# Patient Record
Sex: Female | Born: 1960 | Race: White | Hispanic: No | Marital: Married | Smoking: Former smoker
Health system: Southern US, Community
[De-identification: ages and names within clinical notes are randomized; demographics above are authoritative.]

## PROBLEM LIST (undated history)

## (undated) DIAGNOSIS — A6920 Lyme disease, unspecified: Secondary | ICD-10-CM

## (undated) DIAGNOSIS — M160 Bilateral primary osteoarthritis of hip: Secondary | ICD-10-CM

## (undated) DIAGNOSIS — R0600 Dyspnea, unspecified: Secondary | ICD-10-CM

## (undated) DIAGNOSIS — I499 Cardiac arrhythmia, unspecified: Secondary | ICD-10-CM

## (undated) DIAGNOSIS — E7800 Pure hypercholesterolemia, unspecified: Secondary | ICD-10-CM

## (undated) DIAGNOSIS — I471 Supraventricular tachycardia, unspecified: Secondary | ICD-10-CM

## (undated) DIAGNOSIS — F29 Unspecified psychosis not due to a substance or known physiological condition: Secondary | ICD-10-CM

## (undated) DIAGNOSIS — G4730 Sleep apnea, unspecified: Secondary | ICD-10-CM

## (undated) DIAGNOSIS — T8859XA Other complications of anesthesia, initial encounter: Secondary | ICD-10-CM

## (undated) DIAGNOSIS — J8410 Pulmonary fibrosis, unspecified: Secondary | ICD-10-CM

## (undated) DIAGNOSIS — C439 Malignant melanoma of skin, unspecified: Secondary | ICD-10-CM

## (undated) DIAGNOSIS — R7303 Prediabetes: Secondary | ICD-10-CM

## (undated) DIAGNOSIS — J45909 Unspecified asthma, uncomplicated: Secondary | ICD-10-CM

## (undated) DIAGNOSIS — K760 Fatty (change of) liver, not elsewhere classified: Secondary | ICD-10-CM

## (undated) DIAGNOSIS — K219 Gastro-esophageal reflux disease without esophagitis: Secondary | ICD-10-CM

## (undated) DIAGNOSIS — M4802 Spinal stenosis, cervical region: Secondary | ICD-10-CM

## (undated) DIAGNOSIS — I10 Essential (primary) hypertension: Secondary | ICD-10-CM

## (undated) DIAGNOSIS — M48061 Spinal stenosis, lumbar region without neurogenic claudication: Secondary | ICD-10-CM

## (undated) HISTORY — DX: Malignant melanoma of skin, unspecified: C43.9

## (undated) HISTORY — PX: ABDOMINAL HYSTERECTOMY: SHX81

## (undated) HISTORY — PX: HERNIA REPAIR: SHX51

## (undated) HISTORY — DX: Fatty (change of) liver, not elsewhere classified: K76.0

## (undated) HISTORY — PX: BREAST SURGERY: SHX581

## (undated) HISTORY — DX: Pulmonary fibrosis, unspecified: J84.10

## (undated) HISTORY — DX: Spinal stenosis, lumbar region without neurogenic claudication: M48.061

## (undated) HISTORY — PX: APPENDECTOMY: SHX54

## (undated) HISTORY — PX: LAPAROSCOPY: SHX197

## (undated) HISTORY — PX: BACK SURGERY: SHX140

## (undated) HISTORY — PX: HIATAL HERNIA REPAIR: SHX195

## (undated) HISTORY — DX: Spinal stenosis, cervical region: M48.02

## (undated) HISTORY — PX: OTHER SURGICAL HISTORY: SHX169

## (undated) NOTE — Progress Notes (Signed)
 Formatting of this note is different from the original. S) Victoria [name] is a 7 yr old female patient who had LAVH, BSO 23 months ago.  She states that she has had recurrent UTIs and believes that she has seen stool coming out of the urethra. She has done some research herself and is concerned about a fistula. She has no vaginal sx. She stated that sexual intercourse was good.  O) BP 99/60  Pulse 58  Wt 179 lb (81.194 kg)       Abd soft flat nt       Pelvic         Vulva normal          Urethra normal appearance         Vagina Premarin cream in place, no defects         Cervix absent         Uterus absent         Adnexa no masses nt Past Medical History  Diagnosis Date  ? PERNICIOUS ANEMIA   ? DEPRESSION, UNSPECIFIED   ? GERD   ? OLIGOMENORRHEA   ? HYPERCHOLESTEROLEMIA   ? FIBROMYALGIA 08/18/2004  ? LIVER DISEASE, DRUG INDUCED     Anesthesia related but reversed  ? OBSTRUCTIVE SLEEP APNEA 10/09/2008  ? HX OF HYSTERECTOMY, TOTAL 10/30/2008  ? NEGATIVE COLONOSCOPY DONE OUTSIDE KP IN 2006    Past Surgical History  Procedure Date  ? Diagnostic laparoscopy 5/06    Laparoscopy  ? Appendectomy     Laparoscopic  ? Laparoscopic assisted vaginal hysterectomy 6/10  ? Salpingo-oophorectomy, bilateral 6/10  ? Nasal/sinus ndsc surg w/concha bullosa resection     2008   Current Outpatient Prescriptions  Medication  ? Clarithromycin 500 mg Oral Tab  ? Meloxicam  15 mg Oral Tab  ? Montelukast (SINGULAIR) 10 mg Oral Tab  ? Escitalopram  (LEXAPRO ) 20 mg Oral Tab  ? DULoxetine  (CYMBALTA ) 20 mg Oral CPDR SR Cap  ? Fluticasone -Salmeterol (ADVAIR DISKUS) 500-50 mcg/dose Inhl Disk w/devi  ? Pantoprazole  20 mg Oral TBEC DR Tab  ? Metoprolol  Tartrate 25 mg Oral Tab  ? Albuterol  2.5 mg /3 mL (0.083 %) Inhl Neb Soln  ? clonazePAM  0.5 mg Oral Tab  ? Pravastatin 80 mg Oral Tab  ? Albuterol  (PROAIR  HFA) 90 mcg/Actuation Inhl HFAA  ? Fluticasone  50 mcg/actuation Nasl SpSn  ? Estrogens  Conjugated (PREMARIN) 0.625 mg/gram Vagl Crea  ? Estradiol  0.5 mg Oral Tab  ? Cyanocobalamin  (VITAMIN B-12) 1,000 mcg Oral Tab  ? Blood Glucose Control, Normal (ONE TOUCH ULTRA CONTROL) Misc Soln  ? Blood Sugar Test (ONE TOUCH ULTRA TEST) Misc Strips  ? Peak Flow Meter (TRUZONE PEAK FLOW METER) Misc Devi   Allergy  Acetaminophen ; Amoxicillin trihydrate; Aspirin; Ceftin; Codeine sulfate; Penicillins class; Sulfa class; and Zoloft  A)   1. VAGINITIS   2. URINARY TRACT INFECTION, RECURRENT    Rx)   Orders Placed This Encounter  ? XR URETHROCYSTOGRAPHY RETROGRADE  Finish antibiotics that she is on.         See AVS Completed by:  ROWLAND CHRISTELLA HUMMER MD, September 28, 2010, 11:04 AM ---------------------------------------------------------------------------------  Electronically signed by Hummer Rowland CHRISTELLA (M.D.) at 09/28/2010 11:07 AM EDT

## (undated) NOTE — Telephone Encounter (Signed)
 Formatting of this note might be different from the original. Left messages on home and cell phone numbers for patient to call back. Completed by:  CAROLANNE JULIANNA ARTHOR RANSOM MD, August 06, 2010, 3:30 PM   Electronically signed by ARTHOR RANSOM, CAROLANNE JULIANNA (M.D.) at 08/06/2010  4:47 PM EST

## (undated) NOTE — Telephone Encounter (Signed)
 Formatting of this note might be different from the original. . Electronically signed by Graeme Suzen GRADE (R.N.) at 08/06/2010 11:54 AM EST

---

## 1995-06-01 HISTORY — PX: BREAST EXCISIONAL BIOPSY: SUR124

## 2004-05-31 HISTORY — PX: APPENDECTOMY: SHX54

## 2004-08-11 DIAGNOSIS — H9319 Tinnitus, unspecified ear: Secondary | ICD-10-CM | POA: Insufficient documentation

## 2006-08-03 DIAGNOSIS — M7710 Lateral epicondylitis, unspecified elbow: Secondary | ICD-10-CM | POA: Insufficient documentation

## 2008-05-31 HISTORY — PX: ABDOMINAL HYSTERECTOMY: SHX81

## 2008-10-30 DIAGNOSIS — Z90710 Acquired absence of both cervix and uterus: Secondary | ICD-10-CM | POA: Insufficient documentation

## 2008-10-30 DIAGNOSIS — Z9079 Acquired absence of other genital organ(s): Secondary | ICD-10-CM | POA: Insufficient documentation

## 2009-03-26 DIAGNOSIS — J4530 Mild persistent asthma, uncomplicated: Secondary | ICD-10-CM | POA: Insufficient documentation

## 2010-06-23 DIAGNOSIS — F341 Dysthymic disorder: Secondary | ICD-10-CM | POA: Insufficient documentation

## 2012-03-06 DIAGNOSIS — E739 Lactose intolerance, unspecified: Secondary | ICD-10-CM | POA: Insufficient documentation

## 2012-09-07 DIAGNOSIS — Z1211 Encounter for screening for malignant neoplasm of colon: Secondary | ICD-10-CM | POA: Insufficient documentation

## 2012-09-20 DIAGNOSIS — K6389 Other specified diseases of intestine: Secondary | ICD-10-CM | POA: Insufficient documentation

## 2014-01-02 DIAGNOSIS — R7401 Elevation of levels of liver transaminase levels: Secondary | ICD-10-CM | POA: Insufficient documentation

## 2014-01-02 DIAGNOSIS — R7301 Impaired fasting glucose: Secondary | ICD-10-CM | POA: Insufficient documentation

## 2014-02-25 DIAGNOSIS — F900 Attention-deficit hyperactivity disorder, predominantly inattentive type: Secondary | ICD-10-CM | POA: Insufficient documentation

## 2015-07-05 ENCOUNTER — Emergency Department: Payer: 59

## 2015-07-05 ENCOUNTER — Encounter: Payer: Self-pay | Admitting: Emergency Medicine

## 2015-07-05 ENCOUNTER — Emergency Department
Admission: EM | Admit: 2015-07-05 | Discharge: 2015-07-07 | Disposition: A | Discharge status: Other Acute Inpatient Hospital | Payer: 59 | Attending: Emergency Medicine | Admitting: Emergency Medicine

## 2015-07-05 DIAGNOSIS — R4182 Altered mental status, unspecified: Secondary | ICD-10-CM | POA: Diagnosis present

## 2015-07-05 DIAGNOSIS — Z880 Allergy status to penicillin: Secondary | ICD-10-CM | POA: Diagnosis not present

## 2015-07-05 DIAGNOSIS — M160 Bilateral primary osteoarthritis of hip: Secondary | ICD-10-CM

## 2015-07-05 DIAGNOSIS — Z79899 Other long term (current) drug therapy: Secondary | ICD-10-CM | POA: Insufficient documentation

## 2015-07-05 DIAGNOSIS — F312 Bipolar disorder, current episode manic severe with psychotic features: Secondary | ICD-10-CM

## 2015-07-05 DIAGNOSIS — I10 Essential (primary) hypertension: Secondary | ICD-10-CM

## 2015-07-05 DIAGNOSIS — Z87891 Personal history of nicotine dependence: Secondary | ICD-10-CM | POA: Insufficient documentation

## 2015-07-05 DIAGNOSIS — Z7951 Long term (current) use of inhaled steroids: Secondary | ICD-10-CM | POA: Diagnosis not present

## 2015-07-05 DIAGNOSIS — J45909 Unspecified asthma, uncomplicated: Secondary | ICD-10-CM

## 2015-07-05 DIAGNOSIS — F333 Major depressive disorder, recurrent, severe with psychotic symptoms: Secondary | ICD-10-CM | POA: Diagnosis not present

## 2015-07-05 DIAGNOSIS — F29 Unspecified psychosis not due to a substance or known physiological condition: Secondary | ICD-10-CM

## 2015-07-05 DIAGNOSIS — F309 Manic episode, unspecified: Secondary | ICD-10-CM | POA: Insufficient documentation

## 2015-07-05 DIAGNOSIS — F1310 Sedative, hypnotic or anxiolytic abuse, uncomplicated: Secondary | ICD-10-CM | POA: Diagnosis not present

## 2015-07-05 DIAGNOSIS — E785 Hyperlipidemia, unspecified: Secondary | ICD-10-CM

## 2015-07-05 DIAGNOSIS — K219 Gastro-esophageal reflux disease without esophagitis: Secondary | ICD-10-CM

## 2015-07-05 HISTORY — DX: Gastro-esophageal reflux disease without esophagitis: K21.9

## 2015-07-05 HISTORY — DX: Unspecified asthma, uncomplicated: J45.909

## 2015-07-05 HISTORY — DX: Pure hypercholesterolemia, unspecified: E78.00

## 2015-07-05 HISTORY — DX: Unspecified psychosis not due to a substance or known physiological condition: F29

## 2015-07-05 LAB — COMPREHENSIVE METABOLIC PANEL
ALK PHOS: 92 U/L (ref 38–126)
ALT: 64 U/L — AB (ref 14–54)
AST: 33 U/L (ref 15–41)
Albumin: 3.8 g/dL (ref 3.5–5.0)
Anion gap: 11 (ref 5–15)
BILIRUBIN TOTAL: 0.6 mg/dL (ref 0.3–1.2)
BUN: 22 mg/dL — ABNORMAL HIGH (ref 6–20)
CALCIUM: 9.4 mg/dL (ref 8.9–10.3)
CO2: 24 mmol/L (ref 22–32)
CREATININE: 0.95 mg/dL (ref 0.44–1.00)
Chloride: 109 mmol/L (ref 101–111)
Glucose, Bld: 124 mg/dL — ABNORMAL HIGH (ref 65–99)
Potassium: 3.7 mmol/L (ref 3.5–5.1)
Sodium: 144 mmol/L (ref 135–145)
TOTAL PROTEIN: 7.3 g/dL (ref 6.5–8.1)

## 2015-07-05 LAB — ACETAMINOPHEN LEVEL

## 2015-07-05 LAB — CBC WITH DIFFERENTIAL/PLATELET
Basophils Absolute: 0 10*3/uL (ref 0–0.1)
Basophils Relative: 1 %
EOS ABS: 0.1 10*3/uL (ref 0–0.7)
EOS PCT: 1 %
HCT: 38.8 % (ref 35.0–47.0)
Hemoglobin: 12.9 g/dL (ref 12.0–16.0)
LYMPHS ABS: 2.6 10*3/uL (ref 1.0–3.6)
Lymphocytes Relative: 29 %
MCH: 31.5 pg (ref 26.0–34.0)
MCHC: 33.2 g/dL (ref 32.0–36.0)
MCV: 94.9 fL (ref 80.0–100.0)
Monocytes Absolute: 1 10*3/uL — ABNORMAL HIGH (ref 0.2–0.9)
Monocytes Relative: 11 %
Neutro Abs: 5.1 10*3/uL (ref 1.4–6.5)
Neutrophils Relative %: 58 %
PLATELETS: 311 10*3/uL (ref 150–440)
RBC: 4.09 MIL/uL (ref 3.80–5.20)
RDW: 13.6 % (ref 11.5–14.5)
WBC: 8.8 10*3/uL (ref 3.6–11.0)

## 2015-07-05 LAB — URINALYSIS COMPLETE WITH MICROSCOPIC (ARMC ONLY)
BILIRUBIN URINE: NEGATIVE
GLUCOSE, UA: NEGATIVE mg/dL
HGB URINE DIPSTICK: NEGATIVE
Leukocytes, UA: NEGATIVE
Nitrite: NEGATIVE
Protein, ur: 30 mg/dL — AB
Specific Gravity, Urine: 1.03 (ref 1.005–1.030)
pH: 5 (ref 5.0–8.0)

## 2015-07-05 LAB — URINE DRUG SCREEN, QUALITATIVE (ARMC ONLY)
AMPHETAMINES, UR SCREEN: NOT DETECTED
BARBITURATES, UR SCREEN: NOT DETECTED
Benzodiazepine, Ur Scrn: NOT DETECTED
Cannabinoid 50 Ng, Ur ~~LOC~~: NOT DETECTED
Cocaine Metabolite,Ur ~~LOC~~: NOT DETECTED
MDMA (ECSTASY) UR SCREEN: NOT DETECTED
Methadone Scn, Ur: NOT DETECTED
Opiate, Ur Screen: NOT DETECTED
Phencyclidine (PCP) Ur S: NOT DETECTED
TRICYCLIC, UR SCREEN: POSITIVE — AB

## 2015-07-05 LAB — ETHANOL

## 2015-07-05 LAB — SALICYLATE LEVEL: Salicylate Lvl: <4 mg/dL (ref 2.8–30.0)

## 2015-07-05 MED ORDER — LORAZEPAM 1 MG PO TABS
1.0000 mg | ORAL_TABLET | Freq: Once | ORAL | Status: AC
  Administered 2015-07-05: 1 mg via ORAL
  Filled 2015-07-05: qty 1

## 2015-07-05 MED ORDER — LORAZEPAM 2 MG/ML IJ SOLN
1.0000 mg | Freq: Once | INTRAMUSCULAR | Status: AC
  Administered 2015-07-05: 1 mg via INTRAVENOUS

## 2015-07-05 MED ORDER — QUETIAPINE FUMARATE 25 MG PO TABS
100.0000 mg | ORAL_TABLET | Freq: Every day | ORAL | Status: DC
  Administered 2015-07-05: 100 mg via ORAL
  Filled 2015-07-05: qty 4

## 2015-07-05 MED ORDER — SODIUM CHLORIDE 0.9 % IV BOLUS (SEPSIS)
1000.0000 mL | Freq: Once | INTRAVENOUS | Status: AC
  Administered 2015-07-05: 1000 mL via INTRAVENOUS

## 2015-07-05 MED ORDER — ACETAMINOPHEN 325 MG PO TABS
650.0000 mg | ORAL_TABLET | Freq: Four times a day (QID) | ORAL | Status: DC | PRN
  Administered 2015-07-05: 650 mg via ORAL
  Filled 2015-07-05: qty 2

## 2015-07-05 MED ORDER — DIPHENHYDRAMINE HCL 50 MG/ML IJ SOLN
25.0000 mg | Freq: Once | INTRAMUSCULAR | Status: AC
  Administered 2015-07-05: 25 mg via INTRAVENOUS

## 2015-07-05 MED ORDER — DIPHENHYDRAMINE HCL 50 MG/ML IJ SOLN
INTRAMUSCULAR | Status: AC
  Administered 2015-07-05: 25 mg via INTRAVENOUS
  Filled 2015-07-05: qty 1

## 2015-07-05 MED ORDER — LORAZEPAM 2 MG/ML IJ SOLN
INTRAMUSCULAR | Status: AC
Start: 2015-07-05 — End: 2015-07-05
  Administered 2015-07-05: 1 mg via INTRAVENOUS
  Filled 2015-07-05: qty 1

## 2015-07-05 NOTE — ED Notes (Signed)
Patient and husband doing [facility] psych consult.

## 2015-07-05 NOTE — Consult Note (Addendum)
[facility] HOSPITALIST  Medical Consultation  Victoria [name] OLM:[id number] DOB: [birth year] DOA: 07/05/2015 PCP: No primary care provider on file.   Requesting physician: Kizzie Bane MD Date of consultation: 07/06/2015 Reason for consultation: Temperature of 99.8 F for evaluation of infection.  CHIEF COMPLAINT:   Chief Complaint  Patient presents with  . Altered Mental Status    HISTORY OF PRESENT ILLNESS: Victoria [name]  is a 55 y.o. female with a known history of psychotic disorder, hyperlipidemia, bronchial asthma, GERD currently in the emergency room in the holding unit awaiting inpatient psychiatric placement. Patient is involuntarily committed for acute psychosis and acute mania. Hospitalist service was consulted by ER physician for evaluation of temperature of 99.73F. Subsequent body temperature was 97.35F. Patient seen and evaluated. No complaints of any cough. No history of chills or any burning when she passes urine. No history of any chest pain or shortness of breath. Patient does not volunteer much history little sleepy secondary to medication. Patient is currently on Seroquel and has been seen by psychiatry. Workup in the emergency room urinalysis showed no infection. WBC count and CBC is normal. Electrolytes are normal. Information obtained from the psychiatric consultation note, patient was not sleeping for the last 7 days as per family. She was very disorganized and nonsensical and husband she was getting worse and worse.Marland Kitchen  PAST MEDICAL HISTORY:   Past Medical History  Diagnosis Date  . Psychotic disorder   . Hypercholesterolemia   . Asthma   . GERD (gastroesophageal reflux disease)     PAST SURGICAL HISTORY: Past Surgical History  Procedure Laterality Date  . Hiatal hernia repair      Jan 2017  . Abdominal hysterectomy      SOCIAL HISTORY:  Social History  Substance Use Topics  . Smoking status: Former Research scientist (life sciences)  . Smokeless tobacco: Not on file  . Alcohol Use: No     FAMILY HISTORY: No family history on file.  Family History : Heart Disease : Mother Personal History : Materials engineer.  DRUG ALLERGIES:  Allergies  Allergen Reactions  . Amoxicillin   . Aspirin   . Codeine   . Penicillins   . Sulfa Antibiotics     REVIEW OF SYSTEMS:   CONSTITUTIONAL: No fever, fatigue or weakness.  EYES: No blurred or double vision.  EARS, NOSE, AND THROAT: No tinnitus or ear pain.  RESPIRATORY: No cough, shortness of breath, wheezing or hemoptysis.  CARDIOVASCULAR: No chest pain, orthopnea, edema.  GASTROINTESTINAL: No nausea, vomiting, diarrhea or abdominal pain.  GENITOURINARY: No dysuria, hematuria.  ENDOCRINE: No polyuria, nocturia,  HEMATOLOGY: No anemia, easy bruising or bleeding SKIN: No rash or lesion. MUSCULOSKELETAL: No joint pain or arthritis.   NEUROLOGIC: No tingling, numbness, weakness.  PSYCHIATRY: episodes of acute mania.  MEDICATIONS AT HOME:  Prior to Admission medications   Medication Sig Start Date End Date Taking? Authorizing Provider  busPIRone (BUSPAR) 10 MG tablet Take 10 mg by mouth 2 (two) times daily. 06/12/15  Yes Historical Provider, MD  celecoxib (CELEBREX) 200 MG capsule Take 200 mg by mouth 2 (two) times daily. 05/21/15  Yes Historical Provider, MD  clonazePAM (KLONOPIN) 0.5 MG tablet Take 0.5-1 tablets by mouth 3 (three) times daily as needed. 05/31/15  Yes Historical Provider, MD  escitalopram (LEXAPRO) 20 MG tablet Take 20 mg by mouth 2 (two) times daily. 05/07/15  Yes Historical Provider, MD  fluticasone (FLONASE) 50 MCG/ACT nasal spray Place 2 sprays into both nostrils daily. 04/02/15  Yes Historical Provider, MD  metoprolol tartrate (LOPRESSOR) 25 MG tablet Take 12.5 mg by mouth 2 (two) times daily. 04/06/15  Yes Historical Provider, MD  oxyCODONE-acetaminophen (PERCOCET/ROXICET) 5-325 MG tablet Take 2 tablets by mouth every 4 (four) hours. 06/29/15  Yes Historical Provider, MD  QUEtiapine (SEROQUEL) 25 MG tablet Take 75 mg  by mouth every evening. 05/08/15  Yes Historical Provider, MD  traMADol (ULTRAM) 50 MG tablet Take 1-2 tablets by mouth every 6 (six) hours as needed. 05/21/15  Yes Historical Provider, MD  traZODone (DESYREL) 50 MG tablet Take 1-3 tablets by mouth at bedtime. 05/23/15  Yes Historical Provider, MD  ZETIA 10 MG tablet Take 10 mg by mouth daily. 04/06/15  Yes Historical Provider, MD      PHYSICAL EXAMINATION:   VITAL SIGNS: Blood pressure 112/61, pulse 87, temperature 97.8 F (36.6 C), temperature source Oral, resp. rate 18, height 5' 5"  (1.651 m), weight 69.854 kg (154 lb), SpO2 97 %.  GENERAL:  55 y.o.-year-old patient lying in the bed with no acute distress.  EYES: Pupils equal, round, reactive to light and accommodation. No scleral icterus. Extraocular muscles intact.  HEENT: Head atraumatic, normocephalic. Oropharynx and nasopharynx clear.  NECK:  Supple, no jugular venous distention. No thyroid enlargement, no tenderness.  LUNGS: Normal breath sounds bilaterally, no wheezing, rales,rhonchi or crepitation. No use of accessory muscles of respiration.  CARDIOVASCULAR: S1, S2 normal. No murmurs, rubs, or gallops.  ABDOMEN: Soft, nontender, nondistended. Bowel sounds present. No organomegaly or mass.  EXTREMITIES: No pedal edema, cyanosis, or clubbing.  NEUROLOGIC: Cranial nerves II through XII are intact. Muscle strength 5/5 in all extremities. Sensation intact. No cerebellar signs noted. PSYCHIATRIC: The patient is alert and oriented x 3.  SKIN: No obvious rash, lesion, or ulcer.   LABORATORY PANEL:   CBC  Recent Labs Lab 07/05/15 1709  WBC 8.8  HGB 12.9  HCT 38.8  PLT 311  MCV 94.9  MCH 31.5  MCHC 33.2  RDW 13.6  LYMPHSABS 2.6  MONOABS 1.0*  EOSABS 0.1  BASOSABS 0.0   ------------------------------------------------------------------------------------------------------------------  Chemistries   Recent Labs Lab 07/05/15 1709  NA 144  K 3.7  CL 109  CO2 24   GLUCOSE 124*  BUN 22*  CREATININE 0.95  CALCIUM 9.4  AST 33  ALT 64*  ALKPHOS 92  BILITOT 0.6   ------------------------------------------------------------------------------------------------------------------ estimated creatinine clearance is 65.7 mL/min (by C-G formula based on Cr of 0.95). ------------------------------------------------------------------------------------------------------------------ No results for input(s): TSH, T4TOTAL, T3FREE, THYROIDAB in the last 72 hours.  Invalid input(s): FREET3   Coagulation profile No results for input(s): INR, PROTIME in the last 168 hours. ------------------------------------------------------------------------------------------------------------------- No results for input(s): DDIMER in the last 72 hours. -------------------------------------------------------------------------------------------------------------------  Cardiac Enzymes No results for input(s): CKMB, TROPONINI, MYOGLOBIN in the last 168 hours.  Invalid input(s): CK ------------------------------------------------------------------------------------------------------------------ Invalid input(s): POCBNP  ---------------------------------------------------------------------------------------------------------------  Urinalysis    Component Value Date/Time   COLORURINE AMBER* 07/05/2015 1748   APPEARANCEUR HAZY* 07/05/2015 1748   LABSPEC 1.030 07/05/2015 1748   PHURINE 5.0 07/05/2015 1748   GLUCOSEU NEGATIVE 07/05/2015 1748   HGBUR NEGATIVE 07/05/2015 [place] 07/05/2015 1748   KETONESUR TRACE* 07/05/2015 1748   PROTEINUR 30* 07/05/2015 1748   NITRITE NEGATIVE 07/05/2015 1748   LEUKOCYTESUR NEGATIVE 07/05/2015 1748     RADIOLOGY: No results found.  EKG: Orders placed or performed during the hospital encounter of 07/05/15  . ED EKG  . ED EKG    IMPRESSION AND PLAN: 55 year old female patient with history of psychosis, GERD,  bronchial asthma, hyperlipidemia presented to the emergency room and currently under holding unit in the emergency room awaiting psychiatric placement. Patient is involuntarily committed and has acute mania and psychosis. Assessment 1. Temperature of 99.52F no evidence of any infection 2. Hyperlipidemia 3. Bronchial asthma stable 4. GERD 5. Psychosis 6. Acute mania 7.Incidental Pulmonary nodule on CXR Treatment plan Continue psychiatric recommendations and patient needs in patient psychiatric placement Patient is involuntarily committed Follow-up  cultures. No evidence of any infection at this moment Out patient pulmonary consultation for further evaluation of nodule. Will follow up patient as needed Thank you for consultation.  All the records are reviewed and case discussed with ED provider. Management plans discussed with the patient, family and they are in agreement.  CODE STATUS:FULL Code Status History    This patient does not have a recorded code status. Please follow your organizational policy for patients in this situation.       TOTAL TIME TAKING CARE OF THIS PATIENT: 38 minutes.    Saundra Shelling M.D on 07/05/2015 at 11:45 PM  Between 7am to 6pm - Pager - [phone]  After 6pm go to [email or web address] - password [email or web address] [facility]  [facility]  Office  [phone]  CC: Primary care physician; No primary care provider on file.

## 2015-07-05 NOTE — BH Assessment (Signed)
Per [place] Dewaine Conger, MD)  -Pt needs an inpt psych to be be stabilized, too manic and failed outpt treatment -add Seroquel 139m qhs, hold Lexapro and trazodone for now  Per Psych MD Dr.Kapur,  Pt is to be held overnight and observed for appropriate inpatient placement location determination.

## 2015-07-05 NOTE — ED Notes (Signed)
BEHAVIORAL HEALTH ROUNDING Patient sleeping: Yes.   Patient alert and oriented: yes Behavior appropriate: NO; If no, describe: delusional Nutrition and fluids offered: Yes  Toileting and hygiene offered: Yes  Sitter present: not applicable Law enforcement present: Yes

## 2015-07-05 NOTE — ED Notes (Signed)
Confused at home, recent surgery, off psych meds per report

## 2015-07-05 NOTE — ED Provider Notes (Addendum)
[facility] Emergency Department Provider Note   ____________________________________________  Time seen: Approximately 6:18 PM  I have reviewed the triage vital signs and the nursing notes.   HISTORY  Chief Complaint Altered Mental Status  EM caveat: Patient disorganized process, elevated mood and manic behavior unable to answer all questions appropriately  HPI Victoria [name] is a 55 y.o. female had a recent hiatal hernia surgery about one week ago. As reports is Dr. Laurance Flatten regional never no concerns afterwards.  Patient's husband states that she has a history of previous psychiatric problems, including a psychotic disorder. She is frequently speaking strangely, and he reports that after the surgery he seemed to worsen somewhat. Husband reports that she may have decreased some of her medications recently after the surgery.  He reports that the symptoms that she has been experiencing today namely speaking out in tongues, being unable to sleep for the last few days has happened before and she was hospitalized about 2 years ago for the same.  Denies that she's had any recent illness such as fevers, chills, nausea, vomiting. He does report the patient has not slept at all about the last 2-3 nights.   Patient herself denies being in any pain or discomfort. She says things like "I work in a bee on her right today". The patient proceeds to speak in what seems to be tongues.  Past Medical History  Diagnosis Date  . Psychotic disorder   . Hypercholesterolemia   . Asthma   . GERD (gastroesophageal reflux disease)     There are no active problems to display for this patient.   Past Surgical History  Procedure Laterality Date  . Hiatal hernia repair      Jan 2017  . Abdominal hysterectomy      Current Outpatient Rx  Name  Route  Sig  Dispense  Refill  . busPIRone (BUSPAR) 10 MG tablet   Oral   Take 10 mg by mouth 2 (two) times daily.      1   .  celecoxib (CELEBREX) 200 MG capsule   Oral   Take 200 mg by mouth 2 (two) times daily.      5   . clonazePAM (KLONOPIN) 0.5 MG tablet   Oral   Take 0.5-1 tablets by mouth 3 (three) times daily as needed.      1   . escitalopram (LEXAPRO) 20 MG tablet   Oral   Take 20 mg by mouth 2 (two) times daily.      1   . fluticasone (FLONASE) 50 MCG/ACT nasal spray   Each Nare   Place 2 sprays into both nostrils daily.      11   . metoprolol tartrate (LOPRESSOR) 25 MG tablet   Oral   Take 12.5 mg by mouth 2 (two) times daily.      4   . oxyCODONE-acetaminophen (PERCOCET/ROXICET) 5-325 MG tablet   Oral   Take 2 tablets by mouth every 4 (four) hours.      0   . QUEtiapine (SEROQUEL) 25 MG tablet   Oral   Take 75 mg by mouth every evening.      1   . traMADol (ULTRAM) 50 MG tablet   Oral   Take 1-2 tablets by mouth every 6 (six) hours as needed.      2   . traZODone (DESYREL) 50 MG tablet   Oral   Take 1-3 tablets by mouth at bedtime.  1   . ZETIA 10 MG tablet   Oral   Take 10 mg by mouth daily.      2     Dispense as written.     Allergies Amoxicillin; Aspirin; Codeine; Penicillins; and Sulfa antibiotics  No family history on file.  Social History Social History  Substance Use Topics  . Smoking status: Former Research scientist (life sciences)  . Smokeless tobacco: None  . Alcohol Use: No    Review of Systems combination collected via patient's husband and the patient herself Constitutional: No fever/chills Eyes: No visual changes. ENT: No sore throat. Cardiovascular: Denies chest pain. Respiratory: Denies shortness of breath. Gastrointestinal: No abdominal pain.  No nausea, no vomiting.  Skin: Negative for rash. Neurological: Negative for headaches, focal weakness or numbness.  10-point ROS otherwise negative.  ____________________________________________   PHYSICAL EXAM:  VITAL SIGNS: ED Triage Vitals  Enc Vitals Group     BP 07/05/15 1719 127/72 mmHg      Pulse Rate 07/05/15 1719 85     Resp 07/05/15 1719 18     Temp 07/05/15 1719 99.3 F (37.4 C)     Temp Source 07/05/15 1719 Oral     SpO2 07/05/15 1719 100 %     Weight 07/05/15 1719 154 lb (69.854 kg)     Height 07/05/15 1719 5' 5"  (1.651 m)     Head Cir --      Peak Flow --      Pain Score 07/05/15 1720 0     Pain Loc --      Pain Edu? --      Excl. in [place]? --    Constitutional: Alert and oriented to self and place. Patient is well-groomed, very hypervigilant. Eyes: Conjunctivae are normal. PERRL. EOMI. Head: Atraumatic. Nose: No congestion/rhinnorhea. Mouth/Throat: Mucous membranes are moist.  Oropharynx non-erythematous. Neck: No stridor.  No meningismus. Cardiovascular: Normal rate, regular rhythm. Grossly normal heart sounds.  Good peripheral circulation. Respiratory: Normal respiratory effort.  No retractions. Lungs CTAB. Gastrointestinal: Soft and nontender. No distention. No abdominal bruits. Trocar sites over the epigastric region clean dry and intact. No erythema or swelling. Musculoskeletal: No lower extremity tenderness nor edema.  No joint effusions. Neurologic:  Pressured speech and language with elevated mood. No gross focal neurologic deficits are appreciated.  Skin:  Skin is warm, dry and intact. No rash noted. Psychiatric: Pressured speech and mood. She speaks clearly, but will sometimes began speaking in incomprehensible words almost as though speaking in tongue. ____________________________________________   LABS (all labs ordered are listed, but only abnormal results are displayed)  Labs Reviewed  COMPREHENSIVE METABOLIC PANEL - Abnormal; Notable for the following:    Glucose, Bld 124 (*)    BUN 22 (*)    ALT 64 (*)    All other components within normal limits  CBC WITH DIFFERENTIAL/PLATELET - Abnormal; Notable for the following:    Monocytes Absolute 1.0 (*)    All other components within normal limits  URINALYSIS COMPLETEWITH MICROSCOPIC ([facility] ONLY) -  Abnormal; Notable for the following:    Color, Urine AMBER (*)    APPearance HAZY (*)    Ketones, ur TRACE (*)    Protein, ur 30 (*)    Bacteria, UA RARE (*)    Squamous Epithelial / LPF 0-5 (*)    All other components within normal limits  ACETAMINOPHEN LEVEL - Abnormal; Notable for the following:    Acetaminophen (Tylenol), Serum <10 (*)    All other components within normal limits  URINE DRUG SCREEN, QUALITATIVE ([facility] ONLY) - Abnormal; Notable for the following:    Tricyclic, Ur Screen POSITIVE (*)    All other components within normal limits  URINE CULTURE  ETHANOL  SALICYLATE LEVEL   ____________________________________________  EKG Reviewed and interpreted by me EKG time 1915 Normal sinus rhythm Heart rate 90 PR 135 QTc 460 No evidence acute ischemic T-wave abnormality.  ____________________________________________  RADIOLOGY   ____________________________________________   PROCEDURES  Procedure(s) performed: None  Critical Care performed: No  ____________________________________________   INITIAL IMPRESSION / ASSESSMENT AND PLAN / ED COURSE  Pertinent labs & imaging results that were available during my care of the patient were reviewed by me and considered in my medical decision making (see chart for details).  Patient presents for evaluation of behavioral disturbance. She did have a recent surgery, but does not appear to have any acute evidence of complication. She is afebrile with stable hemodynamics. She denies headache, has no meningismus or secondary evidence to suggest acute infectious etiology. I did obtain labs, we will screen for metabolic toxic, infectious process given recent surgery. However, given discussion with the patient has been appears that this is same as a previous psychiatric admission and a known history of psychosis. He states the symptoms coming go from time to time or last 2 years but are particularly worse after her surgery, he  suspects likely due to medication changes and perhaps not being is compliant with her medicines post surgery.  I primarily suspect this is psychiatric in nature, but we will obtain labs. No evidence to suggest need for acute head imaging at this time. I ordered a consultation by psychiatry.  ----------------------------------------- 6:24 PM on 07/05/2015 -----------------------------------------  Patient has calm after Ativan and Benadryl. She is conversant with husband at bedside. Currently seeing psychiatrist on call. ____________________________________________  ----------------------------------------- 6:58 PM on 07/05/2015 -----------------------------------------  Patient seen and evaluated by psychiatrist. Advised starting her on 100 mg daily at bedtime Seroquel. I have ordered this for the evening. He also advises admission, for which he requested that admission to the hospital to psychiatric service. Psychiatrist and I are in agreement to based on the patient's previous history and presentation today is likely episode is that of acute manic behavior. Likely secondary to psychiatric condition. Patient afebrile, no leukocytosis, labs reassuring. Suspect primary psychiatric.   ----------------------------------------- 11:12 PM on 07/05/2015 -----------------------------------------  Patient is noted and recheck of vital signs to be mildly febrile to 99.8. She is in no distress, remaining vital signs are normal. Given her postoperative status we will obtain a chest x-ray, and we'll continue to monitor her vital signs and fever closely. Based on her current presentation, clinical history, and a previous history of psychosis with similar presentation per her husband I do not suspect acute encephalitis or meningitis, however because of her recent postoperative status we will continue to observe her closely while in the emergency room. Ongoing care and disposition assigned Dr. Dahlia Client who will  follow-up on ongoing vital signs and chest x-ray and consult recommendations by Dr. Jannifer [place]. I have placed a consult to the medicine service due to the low-grade fever and postoperative status to assist the emergency room and further evaluation ruling out acute nonpsychiatric etiologies.  FINAL CLINICAL IMPRESSION(S) / ED DIAGNOSES  Final diagnoses:  Manic episode (HCC)      Delman Kitten, MD 07/05/15 2314  Delman Kitten, MD 07/05/15 9702  Delman Kitten, MD 07/25/15 2210

## 2015-07-05 NOTE — ED Notes (Signed)
BEHAVIORAL HEALTH ROUNDING  Patient sleeping: No.  Patient alert and oriented: yes  Behavior appropriate: Yes. ; If no, describe:  Nutrition and fluids offered: Yes  Toileting and hygiene offered: Yes  Sitter present: not applicable, Q 15 min safety rounds and observation. Law enforcement present: Yes ODS

## 2015-07-05 NOTE — ED Notes (Signed)
ENVIRONMENTAL ASSESSMENT  Potentially harmful objects out of patient reach: Yes.  Personal belongings secured: Yes.  Patient dressed in hospital provided attire only: Yes.  Plastic bags out of patient reach: Yes.  Patient care equipment (cords, cables, call bells, lines, and drains) shortened, removed, or accounted for: Yes.  Equipment and supplies removed from bottom of stretcher: Yes.  Potentially toxic materials out of patient reach: Yes.  Sharps container removed or out of patient reach: Yes.  BEHAVIORAL HEALTH ROUNDING  Patient sleeping: No.  Patient alert and oriented: yes  Behavior appropriate: Yes. ; If no, describe:  Nutrition and fluids offered: Yes  Toileting and hygiene offered: Yes  Sitter present: not applicable, Q 15 min safety rounds and observation. Law enforcement present: Yes ODS  ED [place]  Is the patient under IVC or is there intent for IVC: Yes.  Is the patient medically cleared: Yes.  Is there vacancy in the ED BHU: Yes.  Is the population mix appropriate for patient: Yes.  Is the patient awaiting placement in inpatient or outpatient setting: Yes.  Has the patient had a psychiatric consult: Yes.  Survey of unit performed for contraband, proper placement and condition of furniture, tampering with fixtures in bathroom, shower, and each patient room: Yes. ; Findings: All clear  APPEARANCE/BEHAVIOR  calm, cooperative and adequate rapport can be established  NEURO ASSESSMENT  Orientation: time, place and person  Hallucinations: No.None noted (Hallucinations)  Speech: Normal  Gait: normal  RESPIRATORY ASSESSMENT  WNL  CARDIOVASCULAR ASSESSMENT  WNL  GASTROINTESTINAL ASSESSMENT  WNL  EXTREMITIES  WNL  PLAN OF CARE  Provide calm/safe environment. Vital signs assessed twice daily. ED BHU Assessment once each 12-hour shift. Collaborate with intake RN daily or as condition indicates. Assure the ED provider has rounded once each shift.  Provide and encourage hygiene. Provide redirection as needed. Assess for escalating behavior; address immediately and inform ED provider.  Assess family dynamic and appropriateness for visitation as needed: Yes. ; If necessary, describe findings:  Educate the patient/family about BHU procedures/visitation: Yes. ; If necessary, describe findings: Pt is calm and cooperative at this time. Pt understanding and accepting of unit procedures/rules. Will continue to monitor with Q 15 min safety rounds and observation.

## 2015-07-05 NOTE — BH Assessment (Signed)
Assessment Note  Victoria [name] is an 55 y.o. female. Pt was alert and appeared to be in a manic state. Pt's speech was pressured and thought process was disorganized. Pt's husband shook his head left and right as if saying "no" several times throughout assessment indicating that responses provided by pt were incorrect.   The following information was obtained from Pt's husband without pt present:  Pt experiences A/V/TH and paranoid/persecutory delusions. Pt has no h/o suicide attempts but has verbalized SI in the past. Husband believes past verbalizations were triggered by difficulty coping with MH sxs.  Pt has h/o one inpatient admission (duration 1 wk, 2014) at which time she was dx with psychosis. Pt has since then experienced A/V/TH, as well as paranoid and persecutory delusions.   Pt underwent hernia surgery approximately one week ago and has since then "cut back on the Percocet's". Husband reports increasingly bizarre behaviors (onset 07/01/15). Husband reports pt has not slept for the past 2 days, believes she is under "demonic attack",  and has been "throwing stuff and screaming". Husband reports Pt removed all of her clothing and "kept going in and out of the house with no shoes on". Husband also reports that Pt has presented with bizarre speech (pt refers to as "talking in tongues") and defecated in the living room prior to ED admission.   Husband reports Pt is on soft diet since surgery and has had a poor appetite.   Pt is followed by a psychiatrist (Dr.Shin) and therapist in [place], [place].   Diagnosis: Psychotic d/o  Past Medical History:  Past Medical History  Diagnosis Date  . Psychotic disorder   . Hypercholesterolemia   . Asthma   . GERD (gastroesophageal reflux disease)     Past Surgical History  Procedure Laterality Date  . Hiatal hernia repair      Jan 2017  . Abdominal hysterectomy      Family History: No family history on file.  Social History:  reports that  she has quit smoking. She does not have any smokeless tobacco history on file. She reports that she does not drink alcohol. Her drug history is not on file.  Additional Social History:  Alcohol / Drug Use Pain Medications: None Reported Prescriptions: See Mar Over the Counter: None Reported History of alcohol / drug use?: No history of alcohol / drug abuse  CIWA: CIWA-Ar BP: 127/72 mmHg Pulse Rate: 85 COWS:    Allergies:  Allergies  Allergen Reactions  . Amoxicillin   . Aspirin   . Codeine   . Penicillins   . Sulfa Antibiotics     Home Medications:  (Not in a hospital admission)  OB/GYN Status:  No LMP recorded. Patient has had a hysterectomy.  General Assessment Data Location of Assessment: [facility] ED TTS Assessment: In system Is this a Tele or Face-to-Face Assessment?: Face-to-Face Is this an Initial Assessment or a Re-assessment for this encounter?: Initial Assessment Marital status: Married [place] name: Not Reported Is patient pregnant?: Unknown Pregnancy Status: Unknown Living Arrangements: Spouse/significant other (Husband) Can pt return to current living arrangement?: Yes Admission Status: Involuntary Is patient capable of signing voluntary admission?: No Referral Source: Self/Family/Friend Insurance type: New Site Screening Exam ([place]) Medical Exam completed: Yes  Crisis Care Plan Living Arrangements: Spouse/significant other (Husband) Name of Psychiatrist: Dr.Shin Joelyn Oms, [place]) Name of Therapist: Provider in [facility]  Education Status Is patient currently in school?: No Current Grade: NA Highest grade of school patient has completed:  (Pt  unable to properly respond due to current mental status) Name of school: NA Contact person: Darnell Level (Husband) [phone]  Risk to self with the past 6 months Suicidal Ideation: No Has patient been a risk to self within the past 6 months prior to admission? : No Suicidal Intent: No Has  patient had any suicidal intent within the past 6 months prior to admission? : No Is patient at risk for suicide?: No Suicidal Plan?: No Has patient had any suicidal plan within the past 6 months prior to admission? : No Access to Means: No What has been your use of drugs/alcohol within the last 12 months?: None Reported Previous Attempts/Gestures: No How many times?: 0 Other Self Harm Risks: Current Psychosis Episode Intentional Self Injurious Behavior: None Family Suicide History: Unknown Persecutory voices/beliefs?: Yes Depression: No Substance abuse history and/or treatment for substance abuse?: No Suicide prevention information given to non-admitted patients: Not applicable  Risk to Others within the past 6 months Homicidal Ideation: No Does patient have any lifetime risk of violence toward others beyond the six months prior to admission? : No Thoughts of Harm to Others: No Current Homicidal Intent: No Current Homicidal Plan: No Access to Homicidal Means: No History of harm to others?: No Assessment of Violence: None Noted Does patient have access to weapons?: No Criminal Charges Pending?: No Does patient have a court date: No Is patient on probation?: No  Psychosis Hallucinations: Auditory, Tactile, Visual Delusions: Persecutory  Mental Status Report Appearance/Hygiene: In scrubs, Disheveled Eye Contact: Fair Motor Activity: Restlessness Speech: Pressured Level of Consciousness: Alert Mood: Anxious Affect: Anxious Anxiety Level: Moderate Thought Processes:  (Disorganized) Judgement: Impaired Orientation: Unable to assess Obsessive Compulsive Thoughts/Behaviors: Unable to Assess  Cognitive Functioning Concentration: Decreased Memory: Unable to Assess IQ: Average Insight: Poor Impulse Control: Poor Appetite: Poor Weight Loss: 0 Weight Gain: 0 Sleep: Decreased Total Hours of Sleep:  (Pt has not slept in 2 days) Vegetative Symptoms: None  ADLScreening  [facility]) Patient's cognitive ability adequate to safely complete daily activities?: No (Due to current episode of psychosis) Patient able to express need for assistance with ADLs?: No Independently performs ADLs?: Yes (appropriate for developmental age)  Prior Inpatient Therapy Prior Inpatient Therapy: Yes Prior Therapy Dates: 2014 Prior Therapy Facilty/Provider(s): Not Provided Reason for Treatment: Psychosis  Prior Outpatient Therapy Prior Outpatient Therapy: Yes Prior Therapy Dates: Current Prior Therapy Facilty/Provider(s): Provider located in [place], [place] Reason for Treatment: Psychosis Does patient have an ACCT team?: No Does patient have Intensive In-House Services?  : No Does patient have [facility] services? : No Does patient have P4CC services?: No  ADL Screening (condition at time of admission) Patient's cognitive ability adequate to safely complete daily activities?: No (Due to current episode of psychosis) Is the patient deaf or have difficulty hearing?: No Does the patient have difficulty seeing, even when wearing glasses/contacts?: No Does the patient have difficulty concentrating, remembering, or making decisions?: Yes Patient able to express need for assistance with ADLs?: No Does the patient have difficulty dressing or bathing?: No Independently performs ADLs?: Yes (appropriate for developmental age) Does the patient have difficulty walking or climbing stairs?: No Weakness of Legs: None Weakness of Arms/Hands: None  Home Assistive Devices/Equipment Home Assistive Devices/Equipment: None  Therapy Consults (therapy consults require a physician order) PT Evaluation Needed: No OT Evalulation Needed: No SLP Evaluation Needed: No Abuse/Neglect Assessment (Assessment to be complete while patient is alone) Physical Abuse:  (Pt unable to properly respond due to current mental status) Verbal  Abuse:  (Pt unable to properly respond due to current mental  status) Sexual Abuse:  (Pt unable to properly respond due to current mental status) Exploitation of patient/patient's resources:  (Pt unable to properly respond due to current mental status) Self-Neglect:  (Pt unable to properly respond due to current mental status) Values / Beliefs Cultural Requests During Hospitalization: None Spiritual Requests During Hospitalization: None Consults Spiritual Care Consult Needed: No Social Work Consult Needed: No Regulatory affairs officer (For Healthcare) Does patient have an advance directive?: No Would patient like information on creating an advanced directive?: No - patient declined information    Additional Information 1:1 In Past 12 Months?: No CIRT Risk: Yes Elopement Risk: Yes Does patient have medical clearance?: Yes     Disposition:  Disposition Initial Assessment Completed for this Encounter: Yes Disposition of Patient: Inpatient treatment program Type of inpatient treatment program: Adult (Per [facility])  On Site Evaluation by:   Reviewed with Physician:    Louise Rawson J [place] 07/05/2015 9:33 PM

## 2015-07-06 ENCOUNTER — Encounter: Payer: Self-pay | Admitting: Psychiatry

## 2015-07-06 DIAGNOSIS — K219 Gastro-esophageal reflux disease without esophagitis: Secondary | ICD-10-CM

## 2015-07-06 DIAGNOSIS — F333 Major depressive disorder, recurrent, severe with psychotic symptoms: Secondary | ICD-10-CM | POA: Diagnosis not present

## 2015-07-06 DIAGNOSIS — M160 Bilateral primary osteoarthritis of hip: Secondary | ICD-10-CM

## 2015-07-06 DIAGNOSIS — F312 Bipolar disorder, current episode manic severe with psychotic features: Secondary | ICD-10-CM

## 2015-07-06 DIAGNOSIS — J45909 Unspecified asthma, uncomplicated: Secondary | ICD-10-CM

## 2015-07-06 DIAGNOSIS — I10 Essential (primary) hypertension: Secondary | ICD-10-CM

## 2015-07-06 DIAGNOSIS — E785 Hyperlipidemia, unspecified: Secondary | ICD-10-CM

## 2015-07-06 LAB — HEMOGLOBIN A1C: HEMOGLOBIN A1C: 4.8 % (ref 4.0–6.0)

## 2015-07-06 LAB — LIPID PANEL
Cholesterol: 205 mg/dL — ABNORMAL HIGH (ref 0–200)
HDL: 50 mg/dL (ref >40)
LDL Cholesterol: 134 mg/dL — ABNORMAL HIGH (ref 0–99)
TRIGLYCERIDES: 103 mg/dL (ref <150)
Total CHOL/HDL Ratio: 4.1 RATIO
VLDL: 21 mg/dL (ref 0–40)

## 2015-07-06 LAB — TSH: TSH: 0.556 u[IU]/mL (ref 0.350–4.500)

## 2015-07-06 MED ORDER — FLUTICASONE PROPIONATE 50 MCG/ACT NA SUSP
1.0000 | Freq: Every day | NASAL | Status: DC
  Administered 2015-07-06 – 2015-07-07 (×2): 1 via NASAL
  Filled 2015-07-06: qty 16

## 2015-07-06 MED ORDER — EZETIMIBE 10 MG PO TABS
10.0000 mg | ORAL_TABLET | Freq: Every day | ORAL | Status: DC
  Administered 2015-07-06 – 2015-07-07 (×2): 10 mg via ORAL
  Filled 2015-07-06 (×2): qty 1

## 2015-07-06 MED ORDER — OLANZAPINE 5 MG PO TBDP
5.0000 mg | ORAL_TABLET | Freq: Two times a day (BID) | ORAL | Status: DC
  Administered 2015-07-06 – 2015-07-07 (×3): 5 mg via ORAL
  Filled 2015-07-06 (×5): qty 1

## 2015-07-06 MED ORDER — DOCUSATE SODIUM 100 MG PO CAPS
200.0000 mg | ORAL_CAPSULE | Freq: Two times a day (BID) | ORAL | Status: DC
  Administered 2015-07-07: 200 mg via ORAL
  Filled 2015-07-06 (×2): qty 2

## 2015-07-06 MED ORDER — TRAMADOL HCL 50 MG PO TABS: 50.0000 mg | ORAL_TABLET | Freq: Three times a day (TID) | ORAL | Status: DC | PRN

## 2015-07-06 MED ORDER — TRAMADOL HCL 50 MG PO TABS: 50.0000 mg | ORAL_TABLET | Freq: Three times a day (TID) | ORAL | Status: DC

## 2015-07-06 MED ORDER — PANTOPRAZOLE SODIUM 40 MG PO TBEC
40.0000 mg | DELAYED_RELEASE_TABLET | Freq: Two times a day (BID) | ORAL | Status: DC
  Administered 2015-07-06 – 2015-07-07 (×2): 40 mg via ORAL
  Filled 2015-07-06 (×2): qty 1

## 2015-07-06 MED ORDER — CLONAZEPAM 0.5 MG PO TABS
0.5000 mg | ORAL_TABLET | Freq: Three times a day (TID) | ORAL | Status: DC
  Administered 2015-07-06 – 2015-07-07 (×4): 0.5 mg via ORAL
  Filled 2015-07-06 (×4): qty 1

## 2015-07-06 MED ORDER — POLYETHYLENE GLYCOL 3350 17 G PO PACK: 17.0000 g | PACK | Freq: Every day | ORAL | Status: DC | PRN

## 2015-07-06 MED ORDER — EZETIMIBE 10 MG PO TABS
ORAL_TABLET | ORAL | Status: AC
  Filled 2015-07-06: qty 1

## 2015-07-06 MED ORDER — METOPROLOL TARTRATE 25 MG PO TABS
12.5000 mg | ORAL_TABLET | Freq: Two times a day (BID) | ORAL | Status: DC
  Administered 2015-07-06 – 2015-07-07 (×3): 12.5 mg via ORAL
  Filled 2015-07-06 (×3): qty 1

## 2015-07-06 MED ORDER — OLANZAPINE 5 MG PO TABS
ORAL_TABLET | ORAL | Status: AC
  Administered 2015-07-06: 5 mg
  Filled 2015-07-06: qty 1

## 2015-07-06 MED ORDER — CELECOXIB 200 MG PO CAPS
200.0000 mg | ORAL_CAPSULE | Freq: Two times a day (BID) | ORAL | Status: DC
  Administered 2015-07-06 – 2015-07-07 (×3): 200 mg via ORAL
  Filled 2015-07-06 (×5): qty 1

## 2015-07-06 MED ORDER — DOCUSATE SODIUM 100 MG PO CAPS
ORAL_CAPSULE | ORAL | Status: AC
  Filled 2015-07-06: qty 2

## 2015-07-06 MED ORDER — ESCITALOPRAM OXALATE 10 MG PO TABS
40.0000 mg | ORAL_TABLET | Freq: Every day | ORAL | Status: DC
  Administered 2015-07-06 – 2015-07-07 (×2): 40 mg via ORAL
  Filled 2015-07-06 (×2): qty 4

## 2015-07-06 NOTE — ED Notes (Signed)
Patient asleep in room. No noted distress or abnormal behavior. Will continue 15 minute checks and observation by security cameras for safety.

## 2015-07-06 NOTE — ED Notes (Signed)
Pt became confused after using the restroom and walked into another patient's room. Security officer showed her to her room. Pt was cooperative. No concerns voiced. No distress noted. Maintained on 15 minute checks and observation by security camera for safety.

## 2015-07-06 NOTE — ED Notes (Signed)
Pt compliant with medications. No concerns. No distress. Maintained on 15 minute checks and observation by security camera for safety.

## 2015-07-06 NOTE — Consult Note (Signed)
No evidence of inection on chest xray or UA> labs look great. No further indication for medical follow-up.. Afebrile.

## 2015-07-06 NOTE — ED Notes (Signed)
Pt sleeping in room. No distress noted. Maintained on 15 minute checks and observation by security camera for safety.

## 2015-07-06 NOTE — ED Notes (Signed)
Patient resting quietly in room. No noted distress or abnormal behaviors noted. Will continue 15 minute checks and observation by security camera for safety. 

## 2015-07-06 NOTE — ED Notes (Signed)
BEHAVIORAL HEALTH ROUNDING Patient sleeping: Yes.   Patient alert and oriented: not applicable SLEEPING Behavior appropriate: Yes.  ; If no, describe: SLEEPING Nutrition and fluids offered: No SLEEPING Toileting and hygiene offered: NoSLEEPING Sitter present: not applicable, Q 15 min safety rounds and observation. Law enforcement present: Yes ODS

## 2015-07-06 NOTE — ED Notes (Signed)
Pt resting in room. Her husband will come back to visit her when another patient's visitor leaves. No complaints voiced. No distress noted. Maintained on 15 minute checks and observation by security camera for safety.

## 2015-07-06 NOTE — ED Notes (Signed)
Pt pleasant upon approach. Pt denies SI/HI and AVH. Denies pain. Pt told this writer she has never had any type of hallucinations, past or present. Denied any psychiatric history. Pt knew where she was, but does not understand why she is here or why she needs to talk to a psychiatrist.  Pt calm, wanting to continue to rest. No distress noted. Maintained on 15 minute checks and observation by security camera for safety.

## 2015-07-06 NOTE — ED Provider Notes (Signed)
-----------------------------------------   7:07 AM on 07/06/2015 -----------------------------------------   Blood pressure 122/53, pulse 87, temperature 98.5 F (36.9 C), temperature source Oral, resp. rate 18, height 5' 5"  (1.651 m), weight 154 lb (69.854 kg), SpO2 98 %.  The patient had no acute events since last update.  Calm and cooperative at this time.    The recommendation was made for the patient to be admitted inpatient. She will be evaluated for appropriate inpatient placement today.     Loney Hering, MD 07/06/15 [phone]

## 2015-07-06 NOTE — ED Notes (Signed)
Pt resting in room, does not want the tv on. Pt has no concerns. No distress noted. Pt is awaiting an inpatient bed. Maintained on 15 minute checks and observation by security camera for safety.

## 2015-07-06 NOTE — ED Notes (Signed)
Pt awakened and gotten up to ambulate. Pt ambulated into hallway and back to bed with no difficulty.

## 2015-07-06 NOTE — ED Notes (Signed)
Pt was seen by psychiatrist. Pt will be referred for inpatient treatment. Maintained on 15 minute checks and observation by security camera for safety.

## 2015-07-06 NOTE — ED Notes (Signed)
Report called to Donnamarie Poag., RN in ED BHU.

## 2015-07-06 NOTE — Consult Note (Signed)
[facility] Consult   Reason for Consult:  psychosis Referring Physician:  ER Patient Identification: Victoria [name] MRN:  [id number] Principal Diagnosis: MDD (major depressive disorder), recurrent, severe, with psychosis ([place]) Diagnosis:   Patient Active Problem List   Diagnosis Date Noted  . Osteoarthritis of both hips [M16.0] 07/06/2015  . Dyslipidemia [E78.5] 07/06/2015  . HTN (hypertension) [I10] 07/06/2015  . GERD (gastroesophageal reflux disease) [K21.9] 07/06/2015  . Asthma [J45.909] 07/06/2015  . MDD (major depressive disorder), recurrent, severe, with psychosis ([place]) [F33.3] 07/06/2015    Total Time spent with patient: 1 hour  Subjective:   Victoria [name] is a 55 y.o. female patient admitted with psychosis  HPI:    55 year old married Caucasian female from [place]. She presented to our ER for psychosis on 07/05/15. The patient reports having issues with psychosis since 2014. She has been hearing voices since then. She was hospitalized about a year ago in [place] for worsening hallucinations. She presents today stating that the hallucinations have been gradually worsening. She describes hearing the voices of demons that tell her  they hate her and that they might kill her. She is able to see the demons  pointing fingers in her face.  She states that she has not been able to eat or sleep for "too long". She describes her mood as dysphoric, she grades it as a 8 out of 10 x 10 being the worst. She denies having suicidality, homicidality or feelings of hopelessness or worthlessness.  Patient's husband reported that the symptoms of psychosis were sent a week ago after she had surgery for hiatal hernia.  He suspected that she decrease that some of her medicines after the surgery. He reports that she has been speaking in tounges and has not been sleeping.  Substance abuse history : Negative    Past Psychiatric History: patient started having  psychotic symptoms back in 2014. She is started outpatient treatment in [place]. She was diagnosed with a major depressive disorder with psychotic features. She currently follows up with a psychiatrist in [place] for [place]. She was hospitalized about a year ago in [place] for psychosis.  patient denies any history of suicidal attempts in the past   Risk to Self: Suicidal Ideation: No Suicidal Intent: No Is patient at risk for suicide?: No Suicidal Plan?: No Access to Means: No What has been your use of drugs/alcohol within the last 12 months?: None Reported How many times?: 0 Other Self Harm Risks: Current Psychosis Episode Intentional Self Injurious Behavior: None Risk to Others: Homicidal Ideation: No Thoughts of Harm to Others: No Current Homicidal Intent: No Current Homicidal Plan: No Access to Homicidal Means: No History of harm to others?: No Assessment of Violence: None Noted Does patient have access to weapons?: No Criminal Charges Pending?: No Does patient have a court date: No Prior Inpatient Therapy: Prior Inpatient Therapy: Yes Prior Therapy Dates: 2014 Prior Therapy Facilty/Provider(s): Not Provided Reason for Treatment: Psychosis Prior Outpatient Therapy: Prior Outpatient Therapy: Yes Prior Therapy Dates: Current Prior Therapy Facilty/Provider(s): Provider located in [place], [place] Reason for Treatment: Psychosis Does patient have an ACCT team?: No Does patient have Intensive In-House Services?  : No Does patient have [facility] services? : No Does patient have P4CC services?: No  Past Medical History: Patient reports prior history of cerebral hematoma back in 2004 after falling from a horse. Patient states that since the accident she continued to have problems with her side in her hearing. Past Medical History  Diagnosis  Date  . Psychotic disorder   . Hypercholesterolemia   . Asthma   . GERD (gastroesophageal reflux disease)     Past Surgical History   Procedure Laterality Date  . Hiatal hernia repair      Jan 2017  . Abdominal hysterectomy     Family History:  Family History  Problem Relation Age of Onset  . Heart disease Mother    Family Psychiatric  History: patient reports having a sister with schizophrenia. Denies any history of suicides in her family   Social History: Patient is married. She lives in [place] with her family. She is originally from [place] just moved to [place] about a year ago. 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 History  Alcohol Use No     History  Drug Use No    Comment: No use of illict drugs.    Social History   Social History  . Marital Status: Married    Spouse Name: N/A  . Number of Children: N/A  . Years of Education: N/A   Occupational History  . home maker    Social History Main Topics  . Smoking status: Former Research scientist (life sciences)  . Smokeless tobacco: Never Used  . Alcohol Use: No  . Drug Use: No     Comment: No use of illict drugs.  . Sexual Activity: Not Asked   Other Topics Concern  . None   Social History Narrative   Lives with husband at home.       Allergies:   Allergies  Allergen Reactions  . Amoxicillin   . Aspirin   . Codeine   . Penicillins   . Sulfa Antibiotics     Labs:  Results for orders placed or performed during the hospital encounter of 07/05/15 (from the past 48 hour(s))  Comprehensive metabolic panel     Status: Abnormal   Collection Time: 07/05/15  5:09 PM  Result Value Ref Range   Sodium 144 135 - 145 mmol/L   Potassium 3.7 3.5 - 5.1 mmol/L   Chloride 109 101 - 111 mmol/L   CO2 24 22 - 32 mmol/L   Glucose, Bld 124 (H) 65 - 99 mg/dL   BUN 22 (H) 6 - 20 mg/dL   Creatinine, Ser 0.95 0.44 - 1.00 mg/dL   Calcium 9.4 8.9 - 10.3 mg/dL   Total Protein 7.3 6.5 - 8.1 g/dL   Albumin 3.8 3.5 - 5.0 g/dL   AST 33 15 - 41 U/L   ALT 64 (H) 14 - 54 U/L   Alkaline Phosphatase  92 38 - 126 U/L   Total Bilirubin 0.6 0.3 - 1.2 mg/dL   GFR calc non Af Amer >60 >60 mL/min   GFR calc Af Amer >60 >60 mL/min    Comment: (NOTE) The eGFR has been calculated using the CKD EPI equation. This calculation has not been validated in all clinical situations. eGFR's persistently <60 mL/min signify possible Chronic Kidney Disease.    Anion gap 11 5 - 15  Ethanol     Status: None   Collection Time: 07/05/15  5:09 PM  Result Value Ref Range   Alcohol, Ethyl (B) <5 <5 mg/dL    Comment:        LOWEST DETECTABLE LIMIT FOR SERUM ALCOHOL IS 5 mg/dL FOR MEDICAL PURPOSES ONLY   CBC with Diff     Status: Abnormal   Collection Time: 07/05/15  5:09 PM  Result Value Ref Range   WBC 8.8 3.6 - 11.0 K/uL   RBC 4.09 3.80 - 5.20 MIL/uL   Hemoglobin 12.9 12.0 - 16.0 g/dL   HCT 38.8 35.0 - 47.0 %   MCV 94.9 80.0 - 100.0 fL   MCH 31.5 26.0 - 34.0 pg   MCHC 33.2 32.0 - 36.0 g/dL   RDW 13.6 11.5 - 14.5 %   Platelets 311 150 - 440 K/uL   Neutrophils Relative % 58 %   Neutro Abs 5.1 1.4 - 6.5 K/uL   Lymphocytes Relative 29 %   Lymphs Abs 2.6 1.0 - 3.6 K/uL   Monocytes Relative 11 %   Monocytes Absolute 1.0 (H) 0.2 - 0.9 K/uL   Eosinophils Relative 1 %   Eosinophils Absolute 0.1 0 - 0.7 K/uL   Basophils Relative 1 %   Basophils Absolute 0.0 0 - 0.1 K/uL  Salicylate level     Status: None   Collection Time: 07/05/15  5:09 PM  Result Value Ref Range   Salicylate Lvl <7.0 2.8 - 30.0 mg/dL  Acetaminophen level     Status: Abnormal   Collection Time: 07/05/15  5:09 PM  Result Value Ref Range   Acetaminophen (Tylenol), Serum <10 (L) 10 - 30 ug/mL    Comment:        THERAPEUTIC CONCENTRATIONS VARY SIGNIFICANTLY. A RANGE OF 10-30 ug/mL MAY BE AN EFFECTIVE CONCENTRATION FOR MANY PATIENTS. HOWEVER, SOME ARE BEST TREATED AT CONCENTRATIONS OUTSIDE THIS RANGE. ACETAMINOPHEN CONCENTRATIONS >150 ug/mL AT 4 HOURS AFTER INGESTION AND >50 ug/mL AT 12 HOURS AFTER INGESTION ARE OFTEN  ASSOCIATED WITH TOXIC REACTIONS.   Urinalysis complete, with microscopic ([facility] only)     Status: Abnormal   Collection Time: 07/05/15  5:48 PM  Result Value Ref Range   Color, Urine AMBER (A) YELLOW   APPearance HAZY (A) CLEAR   Glucose, UA NEGATIVE NEGATIVE mg/dL   Bilirubin Urine NEGATIVE NEGATIVE   Ketones, ur TRACE (A) NEGATIVE mg/dL   Specific Gravity, Urine 1.030 1.005 - 1.030   Hgb urine dipstick NEGATIVE NEGATIVE   pH 5.0 5.0 - 8.0   Protein, ur 30 (A) NEGATIVE mg/dL   Nitrite NEGATIVE NEGATIVE   Leukocytes, UA NEGATIVE NEGATIVE   RBC / HPF 0-5 0 - 5 RBC/hpf   WBC, UA 0-5 0 - 5 WBC/hpf   Bacteria, UA RARE (A) NONE SEEN   Squamous Epithelial / LPF 0-5 (A) NONE SEEN   Mucous PRESENT    Ca Oxalate Crys, UA PRESENT   Urine Drug Screen, Qualitative ([facility] only)     Status: Abnormal   Collection Time: 07/05/15  5:48 PM  Result Value Ref Range   Tricyclic, Ur Screen POSITIVE (A) NONE DETECTED   Amphetamines, Ur Screen NONE DETECTED NONE DETECTED   MDMA (Ecstasy)Ur Screen NONE DETECTED NONE DETECTED   Cocaine Metabolite,Ur [place] NONE DETECTED NONE DETECTED   Opiate, Ur Screen NONE DETECTED NONE DETECTED   Phencyclidine (PCP) Ur S NONE DETECTED NONE DETECTED   Cannabinoid 50 Ng, Ur [place] NONE DETECTED NONE DETECTED   Barbiturates, Ur Screen NONE DETECTED NONE DETECTED   Benzodiazepine, Ur Scrn NONE DETECTED NONE DETECTED   Methadone Scn, Ur NONE DETECTED NONE DETECTED    Comment: (NOTE) 017  Tricyclics, urine               Cutoff 1000 ng/mL 200  Amphetamines, urine             Cutoff 1000 ng/mL 300  MDMA (Ecstasy),  urine           Cutoff 500 ng/mL 400  Cocaine Metabolite, urine       Cutoff 300 ng/mL 500  Opiate, urine                   Cutoff 300 ng/mL 600  Phencyclidine (PCP), urine      Cutoff 25 ng/mL 700  Cannabinoid, urine              Cutoff 50 ng/mL 800  Barbiturates, urine             Cutoff 200 ng/mL 900  Benzodiazepine, urine           Cutoff 200 ng/mL 1000 Methadone,  urine                Cutoff 300 ng/mL 1100 1200 The urine drug screen provides only a preliminary, unconfirmed 1300 analytical test result and should not be used for non-medical 1400 purposes. Clinical consideration and professional judgment should 1500 be applied to any positive drug screen result due to possible 1600 interfering substances. A more specific alternate chemical method 1700 must be used in order to obtain a confirmed analytical result.  1800 Gas chromato graphy / mass spectrometry (GC/MS) is the preferred 1900 confirmatory method.     Current Facility-Administered Medications  Medication Dose Route Frequency Provider Last Rate Last Dose  . acetaminophen (TYLENOL) tablet 650 mg  650 mg Oral Q6H PRN Delman Kitten, MD   650 mg at 07/05/15 2355  . celecoxib (CELEBREX) capsule 200 mg  200 mg Oral BID Hildred Priest, MD      . clonazePAM Bobbye [place]) tablet 0.5 mg  0.5 mg Oral TID Hildred Priest, MD   0.5 mg at 07/06/15 1513  . docusate sodium (COLACE) capsule 200 mg  200 mg Oral BID Hildred Priest, MD   200 mg at 07/06/15 1518  . escitalopram (LEXAPRO) tablet 40 mg  40 mg Oral Daily Hildred Priest, MD      . ezetimibe (ZETIA) 10 MG tablet           . ezetimibe (ZETIA) tablet 10 mg  10 mg Oral Daily Hildred Priest, MD   10 mg at 07/06/15 1513  . fluticasone (FLONASE) 50 MCG/ACT nasal spray 1 spray  1 spray Each Nare Daily Hildred Priest, MD      . metoprolol tartrate (LOPRESSOR) tablet 12.5 mg  12.5 mg Oral BID Hildred Priest, MD   12.5 mg at 07/06/15 1514  . OLANZapine zydis (ZYPREXA) disintegrating tablet 5 mg  5 mg Oral BID Hildred Priest, MD   5 mg at 07/06/15 1516  . pantoprazole (PROTONIX) EC tablet 40 mg  40 mg Oral BID AC Hildred Priest, MD      . polyethylene glycol (MIRALAX / GLYCOLAX) packet 17 g  17 g Oral Daily PRN Hildred Priest, MD      . traMADol Veatrice Bourbon)  tablet 50 mg  50 mg Oral TID PRN Hildred Priest, MD       Current Outpatient Prescriptions  Medication Sig Dispense Refill  . busPIRone (BUSPAR) 10 MG tablet Take 10 mg by mouth 2 (two) times daily.  1  . celecoxib (CELEBREX) 200 MG capsule Take 200 mg by mouth 2 (two) times daily.  5  . clonazePAM (KLONOPIN) 0.5 MG tablet Take 0.5-1 tablets by mouth 3 (three) times daily as needed.  1  . escitalopram (LEXAPRO) 20 MG tablet Take 20 mg by mouth 2 (two) times daily.  1  . fluticasone (FLONASE) 50 MCG/ACT nasal spray Place 2 sprays into both nostrils daily.  11  . metoprolol tartrate (LOPRESSOR) 25 MG tablet Take 12.5 mg by mouth 2 (two) times daily.  4  . oxyCODONE-acetaminophen (PERCOCET/ROXICET) 5-325 MG tablet Take 2 tablets by mouth every 4 (four) hours.  0  . QUEtiapine (SEROQUEL) 25 MG tablet Take 75 mg by mouth every evening.  1  . traMADol (ULTRAM) 50 MG tablet Take 1-2 tablets by mouth every 6 (six) hours as needed.  2  . traZODone (DESYREL) 50 MG tablet Take 1-3 tablets by mouth at bedtime.  1  . ZETIA 10 MG tablet Take 10 mg by mouth daily.  2    Musculoskeletal: Strength & Muscle Tone: within normal limits Gait & Station: normal Patient leans: N/A  Psychiatric Specialty Exam: Review of Systems  Constitutional: Negative.   HENT: Negative.   Eyes: Negative.   Respiratory: Negative.   Cardiovascular: Negative.   Gastrointestinal: Positive for abdominal pain and constipation.  Genitourinary: Negative.   Musculoskeletal: Positive for joint pain.  Skin: Negative.   Neurological: Negative.   Endo/Heme/Allergies: Negative.   Psychiatric/Behavioral: Positive for depression and hallucinations. The patient is nervous/anxious and has insomnia.     Blood pressure 102/49, pulse 85, temperature 98.1 F (36.7 C), temperature source Oral, resp. rate 18, height _0  (1.651 m), weight 69.854 kg (154 lb), SpO2 97 %.Body mass index is 25.63 kg/(m^2).  General Appearance:  Disheveled  Eye Contact::  Good  Speech:  Clear and Coherent  Volume:  Normal  Mood:  Anxious and Dysphoric  Affect:  Congruent  Thought Process:  Circumstantial  Orientation:  Full (Time, Place, and Person)  Thought Content:  Hallucinations: Auditory Visual  Suicidal Thoughts:  No  Homicidal Thoughts:  No  Memory:  Immediate;   Fair Recent;   Good Remote;   Good  Judgement:  Fair  Insight:  Fair  Psychomotor Activity:  Decreased  Concentration:  Fair  Recall:  [employer] of Knowledge:Fair  Language: Good  Akathisia:  No  Handed:    AIMS (if indicated):     Assets:  Agricultural consultant Housing Social Support  ADL's:  Intact  Cognition: WNL  Sleep:      Treatment Plan Summary: Daily contact with patient to assess and evaluate symptoms and progress in treatment and Medication management   Depression and psychotic symptoms: Patient reports she has been having issues with depression and psychosis since 2014. Usually when the depression gets better due to psychotic symptoms do not improve. She reports having hallucinations and a daily basis since then. Patient has family history of schizophrenia (sister). Possible diagnoses include major depressive disorder with psychotic features or schizoaffective disorder depressed type. We will need to rule out organic causes for psychosis as she has history of significant head trauma and also as she was found to have a suspicious lesion on chest x-ray. I will order a brain MRI.  Psychosis: Will start the patient on olanzapine 5 mg by mouth twice a day.  Anxiety the patient will be continued on home regimen of clonazepam 0.5 mg by mouth 3 times a day   Depressive symptoms: Continue home regimen of Lexapro 40 mg a day  Hypertension continue metoprolol 12.5 mg by mouth twice a day  GERD patient will be started on Protonix 40 mg by mouth twice a day  Osteoarthritis: Celebrex 200 mg by mouth twice a  day  Dyslipidemia: Continue zetia 10  mg daily  Pain---s/p hiatal hernia repair: will order tramadol 50 mg tid prn  Allergies : Continue Flonase 50 g daily   S/p hiatal hernia repair: will order soft diet  Chest x ray: 2. 12 mm nodule at the right apex. Recommend nonemergent chest CT.  Labs: I will order hemoglobin A1c, TSH, lipid panel, and prolactin.  Hospitalization status: currently IVC    Disposition: Recommend psychiatric Inpatient admission when medically cleared.  Hildred Priest, MD 07/06/2015 3:25 PM

## 2015-07-07 ENCOUNTER — Inpatient Hospital Stay
Admission: EM | Admit: 2015-07-07 | Discharge: 2015-07-10 | DRG: 885 | Disposition: A | Discharge status: Home / Self Care | Payer: No Typology Code available for payment source | Source: Intra-hospital | Attending: Psychiatry | Admitting: Psychiatry

## 2015-07-07 ENCOUNTER — Encounter: Payer: Self-pay | Admitting: Psychiatry

## 2015-07-07 DIAGNOSIS — Z915 Personal history of self-harm: Secondary | ICD-10-CM

## 2015-07-07 DIAGNOSIS — G4730 Sleep apnea, unspecified: Secondary | ICD-10-CM | POA: Diagnosis present

## 2015-07-07 DIAGNOSIS — Z79899 Other long term (current) drug therapy: Secondary | ICD-10-CM | POA: Diagnosis not present

## 2015-07-07 DIAGNOSIS — M160 Bilateral primary osteoarthritis of hip: Secondary | ICD-10-CM | POA: Diagnosis present

## 2015-07-07 DIAGNOSIS — Z91410 Personal history of adult physical and sexual abuse: Secondary | ICD-10-CM

## 2015-07-07 DIAGNOSIS — F309 Manic episode, unspecified: Secondary | ICD-10-CM | POA: Diagnosis not present

## 2015-07-07 DIAGNOSIS — Z8249 Family history of ischemic heart disease and other diseases of the circulatory system: Secondary | ICD-10-CM

## 2015-07-07 DIAGNOSIS — K5900 Constipation, unspecified: Secondary | ICD-10-CM | POA: Diagnosis present

## 2015-07-07 DIAGNOSIS — J45909 Unspecified asthma, uncomplicated: Secondary | ICD-10-CM | POA: Diagnosis present

## 2015-07-07 DIAGNOSIS — F332 Major depressive disorder, recurrent severe without psychotic features: Secondary | ICD-10-CM | POA: Diagnosis present

## 2015-07-07 DIAGNOSIS — I10 Essential (primary) hypertension: Secondary | ICD-10-CM | POA: Diagnosis present

## 2015-07-07 DIAGNOSIS — K219 Gastro-esophageal reflux disease without esophagitis: Secondary | ICD-10-CM | POA: Diagnosis present

## 2015-07-07 DIAGNOSIS — F333 Major depressive disorder, recurrent, severe with psychotic symptoms: Principal | ICD-10-CM | POA: Diagnosis present

## 2015-07-07 DIAGNOSIS — E7800 Pure hypercholesterolemia, unspecified: Secondary | ICD-10-CM | POA: Diagnosis present

## 2015-07-07 DIAGNOSIS — Z818 Family history of other mental and behavioral disorders: Secondary | ICD-10-CM | POA: Diagnosis not present

## 2015-07-07 DIAGNOSIS — F312 Bipolar disorder, current episode manic severe with psychotic features: Secondary | ICD-10-CM | POA: Diagnosis present

## 2015-07-07 DIAGNOSIS — E785 Hyperlipidemia, unspecified: Secondary | ICD-10-CM | POA: Diagnosis present

## 2015-07-07 HISTORY — DX: Essential (primary) hypertension: I10

## 2015-07-07 HISTORY — DX: Bilateral primary osteoarthritis of hip: M16.0

## 2015-07-07 HISTORY — DX: Sleep apnea, unspecified: G47.30

## 2015-07-07 LAB — PROLACTIN: PROLACTIN: 9.7 ng/mL (ref 4.8–23.3)

## 2015-07-07 MED ORDER — CELECOXIB 200 MG PO CAPS
200.0000 mg | ORAL_CAPSULE | Freq: Two times a day (BID) | ORAL | Status: DC
  Administered 2015-07-07 – 2015-07-10 (×6): 200 mg via ORAL
  Filled 2015-07-07 (×8): qty 1

## 2015-07-07 MED ORDER — OLANZAPINE 5 MG PO TBDP
5.0000 mg | ORAL_TABLET | Freq: Two times a day (BID) | ORAL | Status: DC
  Administered 2015-07-07: 5 mg via ORAL
  Filled 2015-07-07: qty 1

## 2015-07-07 MED ORDER — FLUTICASONE PROPIONATE 50 MCG/ACT NA SUSP
1.0000 | Freq: Every day | NASAL | Status: DC
  Administered 2015-07-08 – 2015-07-10 (×3): 1 via NASAL
  Filled 2015-07-07: qty 16

## 2015-07-07 MED ORDER — CLONAZEPAM 0.5 MG PO TABS
0.5000 mg | ORAL_TABLET | Freq: Three times a day (TID) | ORAL | Status: DC
  Administered 2015-07-07: 0.5 mg via ORAL
  Filled 2015-07-07 (×2): qty 1

## 2015-07-07 MED ORDER — ESCITALOPRAM OXALATE 10 MG PO TABS
40.0000 mg | ORAL_TABLET | Freq: Every day | ORAL | Status: DC
  Administered 2015-07-08 – 2015-07-10 (×3): 40 mg via ORAL
  Filled 2015-07-07 (×3): qty 4

## 2015-07-07 MED ORDER — ALUM & MAG HYDROXIDE-SIMETH 200-200-20 MG/5ML PO SUSP: 30.0000 mL | ORAL | Status: DC | PRN

## 2015-07-07 MED ORDER — DOCUSATE SODIUM 100 MG PO CAPS
200.0000 mg | ORAL_CAPSULE | Freq: Two times a day (BID) | ORAL | Status: DC
  Administered 2015-07-09: 200 mg via ORAL
  Filled 2015-07-07 (×6): qty 2

## 2015-07-07 MED ORDER — ACETAMINOPHEN 325 MG PO TABS: 650.0000 mg | ORAL_TABLET | Freq: Four times a day (QID) | ORAL | Status: DC | PRN

## 2015-07-07 MED ORDER — MAGNESIUM HYDROXIDE 400 MG/5ML PO SUSP: 30.0000 mL | Freq: Every day | ORAL | Status: DC | PRN

## 2015-07-07 MED ORDER — POLYETHYLENE GLYCOL 3350 17 G PO PACK: 17.0000 g | PACK | Freq: Every day | ORAL | Status: DC | PRN

## 2015-07-07 MED ORDER — EZETIMIBE 10 MG PO TABS
10.0000 mg | ORAL_TABLET | Freq: Every day | ORAL | Status: DC
  Administered 2015-07-08 – 2015-07-10 (×3): 10 mg via ORAL
  Filled 2015-07-07 (×3): qty 1

## 2015-07-07 MED ORDER — TRAMADOL HCL 50 MG PO TABS
50.0000 mg | ORAL_TABLET | Freq: Three times a day (TID) | ORAL | Status: DC | PRN
  Administered 2015-07-07 – 2015-07-08 (×2): 50 mg via ORAL
  Filled 2015-07-07 (×2): qty 1

## 2015-07-07 MED ORDER — METOPROLOL TARTRATE 25 MG PO TABS
12.5000 mg | ORAL_TABLET | Freq: Two times a day (BID) | ORAL | Status: DC
  Administered 2015-07-07 – 2015-07-10 (×6): 12.5 mg via ORAL
  Filled 2015-07-07 (×6): qty 1

## 2015-07-07 MED ORDER — PANTOPRAZOLE SODIUM 40 MG PO TBEC
40.0000 mg | DELAYED_RELEASE_TABLET | Freq: Two times a day (BID) | ORAL | Status: DC
  Administered 2015-07-08 – 2015-07-10 (×5): 40 mg via ORAL
  Filled 2015-07-07 (×8): qty 1

## 2015-07-07 NOTE — ED Notes (Signed)
Patient somewhat anxious but responsive to reassurance.Took all medications orally without incident.  Call received from radiology to schedule MRI. Patient is refusing because of severe claustrophobia. Husband called to confirm this. He usually accompanies her for tests. Will postpone until husband can arrange this with radiology, probably when patient on inpatient unit and more stabilized. Dr. Jerilee Hoh made aware.

## 2015-07-07 NOTE — ED Notes (Signed)
Patient visiting with husband. Visit going well. Maintained on 15 minute checks and observation by security camera for safety.

## 2015-07-07 NOTE — ED Notes (Signed)
Patient resting quietly in room. No noted distress or abnormal behaviors noted. Will continue 15 minute checks and observation by security camera for safety. 

## 2015-07-07 NOTE — ED Notes (Signed)
Meal given. Patient continues calm and cooperative.Maintained on 15 minute checks and observation by security camera for safety.

## 2015-07-07 NOTE — ED Notes (Signed)
ENVIRONMENTAL ASSESSMENT Potentially harmful objects out of patient reach: YES Personal belongings secured: YES Patient dressed in hospital provided attire only: YES Plastic bags out of patient reach: YES Patient care equipment (cords, cables, call bells, lines, and drains) shortened, removed, or accounted for: YES Equipment and supplies removed from bottom of stretcher: YES Potentially toxic materials out of patient reach: [employer] container removed or out of patient reach: YES

## 2015-07-07 NOTE — ED Notes (Signed)
Patient asleep in room. No noted distress or abnormal behavior. Will continue 15 minute checks and observation by security cameras for safety.

## 2015-07-07 NOTE — ED Notes (Addendum)
ED BHU [place] Is the patient under IVC or is there intent for IVC: Yes.   Is the patient medically cleared: Yes.   Is there vacancy in the ED BHU: Yes.   Is the population mix appropriate for patient: Yes.   Is the patient awaiting placement in inpatient or outpatient setting: Yes.   Has the patient had a psychiatric consult: Yes.   Survey of unit performed for contraband, proper placement and condition of furniture, tampering with fixtures in bathroom, shower, and each patient room: Yes.  ; Findings:  APPEARANCE/BEHAVIOR calm, cooperative and adequate rapport can be established NEURO ASSESSMENT Orientation: time, place and person Hallucinations:Visual  Speech: Normal Gait: normal RESPIRATORY ASSESSMENT Normal expansion.  Clear to auscultation.  No rales, rhonchi, or wheezing. CARDIOVASCULAR ASSESSMENT regular rate and rhythm, S1, S2 normal, no murmur, click, rub or gallop GASTROINTESTINAL ASSESSMENT soft, nontender, BS WNL, no r/g EXTREMITIES normal strength, tone, and muscle mass PLAN OF CARE Provide calm/safe environment. Vital signs assessed twice daily. ED BHU Assessment once each 12-hour shift. Collaborate with intake RN daily or as condition indicates. Assure the ED provider has rounded once each shift. Provide and encourage hygiene. Provide redirection as needed. Assess for escalating behavior; address immediately and inform ED provider.  Assess family dynamic and appropriateness for visitation as needed: Yes.  ; If necessary, describe findings:  Educate the patient/family about BHU procedures/visitation: Yes.  ; If necessary, describe findings:

## 2015-07-07 NOTE — Consult Note (Signed)
No evidence of inection on chest xray or UA> labs look great. No further indication for medical follow-up.. Afebrile. No further need to follow,will sign off.recall i if  needed LDL is more than 100 but it is not fasting value,recomend fasting blood work,

## 2015-07-07 NOTE — ED Notes (Signed)
BEHAVIORAL HEALTH ROUNDING Patient sleeping: No Patient alert and oriented: YES Behavior appropriate: YES Describe behavior: No inappropriate or unacceptable behaviors noted at this time.  Nutrition and fluids offered: YES Toileting and hygiene offered: YES Sitter present: Industrial/product designer rounding every 15 minutes on patient to ensure safety.  Law enforcement present: Programmer, applications agency: [place] (OD[place])

## 2015-07-07 NOTE — ED Notes (Signed)
Patient given toiletries to shower

## 2015-07-07 NOTE — Progress Notes (Signed)
Patient is to be admitted to [facility] by Dr. Jerilee Hoh Attending Physician will be Dr. Bary Leriche.   Patient has been assigned to room 301, by [place].   Intake Paper Work has been signed and placed on patient chart.  ER staff is aware of the admission [facility].; ER MD; Amy Patient's Nurse &  Patient Access). Please Call report @ [phone]     07/07/2015 Con Memos, MS, [place], [facility] Therapeutic Triage Specialist

## 2015-07-07 NOTE — ED Notes (Signed)
Report called to [facility], pt admitted to beh med.

## 2015-07-07 NOTE — ED Notes (Signed)
ENVIRONMENTAL ASSESSMENT Potentially harmful objects out of patient reach: Yes Personal belongings secured: Yes Patient dressed in hospital provided attire only: Yes Plastic bags out of patient reach: Yes Patient care equipment (cords, cables, call bells, lines, and drains) shortened, removed, or accounted for: Yes Equipment and supplies removed from bottom of stretcher: Yes Potentially toxic materials out of patient reach: Yes [employer] container removed or out of patient reach: Yes  Patient asleep in room. No noted distress or abnormal behavior. Will continue 15 minute checks and observation by security cameras for safety.

## 2015-07-07 NOTE — Progress Notes (Addendum)
Pt admitted to unit from [facility]. Pt is alert and oriented x4. Pt reports that she is here due to an episode of psychosis that she believes occurred due to a recent prescription of Percocet she has been taking after surgery. Pt states that she hears demonic voices saying "we're going to kill you, beat you up, and rape you." Pt reports "I just ignore them. They're nasty words." Pt also states that she sees these "demons" that are speaking to her. Skin assessment performed and no contraband found. Pt has incisions on her abdomen from a recent hernia surgery. Incisions are held together with steri strips. Wounds are clean and dry at this time. Pt oriented to unit. No concerns or complaints at this time. q15 minute safety checks maintained. Pt remains free from harm.

## 2015-07-07 NOTE — ED Notes (Signed)
Report received from [facility] RN. Patient care assumed. Patient/RN introduction complete. Will continue to monitor.  Pt sitting in bed eating, co abd pain states since surgery recently.  Rates pain 4/10 at this time with no distress noted.  Pt awaiting admission to beh med unit.

## 2015-07-07 NOTE — ED Provider Notes (Signed)
-----------------------------------------   6:59 AM on 07/07/2015 -----------------------------------------   Blood pressure 101/69, pulse 71, temperature 99.5 F (37.5 C), temperature source Oral, resp. rate 22, height 5' 5"  (1.651 m), weight 154 lb (69.854 kg), SpO2 95 %.  The patient had no acute events since last update.  Calm and cooperative at this time.  Disposition is pending per Psychiatry/Behavioral Medicine team recommendations.     Paulette Blanch, MD 07/07/15 [phone]

## 2015-07-08 DIAGNOSIS — F333 Major depressive disorder, recurrent, severe with psychotic symptoms: Principal | ICD-10-CM

## 2015-07-08 LAB — URINE CULTURE: Special Requests: NORMAL

## 2015-07-08 MED ORDER — GABAPENTIN 100 MG PO CAPS
100.0000 mg | ORAL_CAPSULE | Freq: Three times a day (TID) | ORAL | Status: DC
  Administered 2015-07-08 – 2015-07-10 (×7): 100 mg via ORAL
  Filled 2015-07-08 (×7): qty 1

## 2015-07-08 MED ORDER — CLONAZEPAM 0.5 MG PO TABS
0.5000 mg | ORAL_TABLET | Freq: Every day | ORAL | Status: DC
  Administered 2015-07-08 – 2015-07-09 (×2): 0.5 mg via ORAL
  Filled 2015-07-08: qty 1

## 2015-07-08 MED ORDER — OLANZAPINE 5 MG PO TBDP
10.0000 mg | ORAL_TABLET | Freq: Two times a day (BID) | ORAL | Status: DC
  Administered 2015-07-08 – 2015-07-10 (×5): 10 mg via ORAL
  Filled 2015-07-08 (×6): qty 2
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
## 2015-07-08 NOTE — BHH Group Notes (Signed)
[facility] Group Notes:  (Nursing/MHT/Case Management/Adjunct)  Date:  07/08/2015  Time:  1:57 PM  Type of Therapy:  Psychoeducational Skills  Participation Level:  Did Not Attend  Celso Amy 07/08/2015, 1:57 PM

## 2015-07-08 NOTE — BHH Group Notes (Signed)
[facility] Group Notes:  (Nursing/MHT/Case Management/Adjunct)  Date:  07/08/2015  Time:  10:13 PM  Type of Therapy:  Evening Wrap-up Group  Participation Level:  Active  Participation Quality:  Attentive  Affect:  Appropriate  Cognitive:  Alert  Insight:  Appropriate  Engagement in Group:  Developing/Improving  Modes of Intervention:  Discussion  Summary of Progress/Problems:  Levonne Spiller 07/08/2015, 10:13 PM
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
## 2015-07-08 NOTE — Progress Notes (Signed)
Recreation Therapy Notes  Date: 02.07.17 Time: 3:00 pm Location: Craft Room  Group Topic: Self-expression  Goal Area(s) Addresses:  Patient will be able to identify a color that represents each emotion. Patient will verbalize benefit of using art as a means of self-expression. Patient will verbalize one emotion experienced while participating in activity.  Behavioral Response: Attentive, Interactive  Intervention: The Colors Within Me  Activity: Patients were given a blank face worksheet and instructed to analyze the emotions they were experiencing, pick a color for each emotion, and show on the face how much of that emotion they were experiencing.  Education: LRT educated patients on different forms of self-expression.  Education Outcome: Acknowledges education/In group clarification offered  Clinical Observations/Feedback: Patient completed activity by picking colors for each emotion she is experiencing and showing on the worksheet how much of that emotion she is experiencing. Patient contributed to group discussion vy stating what emotions she was feeling, how the emotions affect her treatment in the hospital, how her emotions will change once she reaches her goals and is able to d/c, that it was helpful to see her emotions on paper, and what steps she is taking to change her emotions to positive ones.  Leonette Monarch, LRT/CTRS 07/08/2015 4:04 PM

## 2015-07-08 NOTE — Progress Notes (Signed)
Denies SI/HI/AVH. Pt. Is pleasant and cooperative although affect is tense.  Medication and group compliant.  Support and encouragement offered.  Safety maintained.

## 2015-07-08 NOTE — Tx Team (Signed)
Initial Interdisciplinary Treatment Plan   PATIENT STRESSORS: Health problems Medication change or noncompliance   PATIENT STRENGTHS: Ability for insight Average or above average intelligence Capable of independent living Communication skills General fund of knowledge Supportive family/friends   PROBLEM LIST: Problem List/Patient Goals Date to be addressed Date deferred Reason deferred Estimated date of resolution  Psychosis 07/07/15           "self control--calmness, mindfulness, peace" 07/07/15                                          DISCHARGE CRITERIA:  Improved stabilization in mood, thinking, and/or behavior Motivation to continue treatment in a less acute level of care Reduction of life-threatening or endangering symptoms to within safe limits Verbal commitment to aftercare and medication compliance  PRELIMINARY DISCHARGE PLAN: Outpatient therapy Return to previous living arrangement  PATIENT/FAMIILY INVOLVEMENT: This treatment plan has been presented to and reviewed with the patient, Victoria [name], and/or family member.  The patient and family have been given the opportunity to ask questions and make suggestions.  Rolan Wrightsman L Parsells 07/08/2015, 12:15 AM

## 2015-07-08 NOTE — BHH Group Notes (Signed)
[facility] LCSW Group Therapy  07/08/2015 2:44 PM  Type of Therapy:  Group Therapy  Participation Level:  Did Not Attend  Summary of Progress/Problems: Patient was called to group but did not attend.   Keene Breath, MSW, LCSWA  07/08/2015, 2:44 PM
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
## 2015-07-08 NOTE — Plan of Care (Signed)
Problem: Alteration in thought process Goal: LTG-Patient behavior demonstrates decreased signs psychosis (Patient behavior demonstrates decreased signs of psychosis to the point the patient is safe to return home and continue treatment in an outpatient setting.)  Outcome: Progressing Alert and oriented x 4.  Denies AVH.   Verbalized that she was seeing and hearing things on yesterday but nothing today.

## 2015-07-09 MED ORDER — CLONAZEPAM 0.5 MG PO TABS
0.5000 mg | ORAL_TABLET | Freq: Three times a day (TID) | ORAL | Status: DC
  Administered 2015-07-09 – 2015-07-10 (×3): 0.5 mg via ORAL
  Filled 2015-07-09 (×3): qty 1

## 2015-07-09 MED ORDER — TRAZODONE HCL 100 MG PO TABS
100.0000 mg | ORAL_TABLET | Freq: Every day | ORAL | Status: DC
  Administered 2015-07-09: 100 mg via ORAL
  Filled 2015-07-09: qty 1

## 2015-07-09 NOTE — Progress Notes (Signed)
Recreation Therapy Notes  Date: 02.08.17 Time: 3:00 pm Location: Craft Room  Group Topic: Self-esteem  Goal Area(s) Addresses:  Patient will be able to identify benefit of self-esteem. Patient will be able to identify ways to increase self-esteem.  Behavioral Response: Attentive, Interactive  Intervention: Self-Portrait  Activity: Patients were instructed to draw their self-portrait of how they felt. Patients were then instructed to write their name on a piece of paper and a positive trait. Patient's passed the paper around the room and wrote positive traits about peers. Patient drew their self-portrait again after they read the positive traits.  Education: LRT educated patients on ways they can increase their self-esteem.  Education Outcome: Acknowledges education/In group clarification offered   Clinical Observations/Feedback: Patient completed activity by drawing both self-portraits and writing something positive about herself and the peers. Patient contributed to group discussion by stating how her faces were different, and how she can increase her self-esteem.  Leonette Monarch, LRT/CTRS 07/09/2015 5:34 PM

## 2015-07-09 NOTE — Plan of Care (Signed)
Problem: Ineffective individual coping Goal: STG: Patient will remain free from self harm Outcome: Progressing Pt remains free from harm  Problem: Alteration in thought process Goal: LTG-Patient is able to perceive the environment accurately Outcome: Progressing Pt is alert and oriented x4

## 2015-07-09 NOTE — Progress Notes (Signed)
Pleasant and cooperative.  Denies SI.  Professes to hearing voices but states "they are quieter today" rates depression as a 4.  Medication and group compliant.  Support and encouragement offered.  Safety maintained.

## 2015-07-09 NOTE — BHH Suicide Risk Assessment (Signed)
[facility] INPATIENT:  Family/Significant Other Suicide Prevention Education  Suicide Prevention Education:  Education Completed;  With the pt's husband Astronomer at [phone],  (name of family member/significant other) has been identified by the patient as the family member/significant other with whom the patient will be residing, and identified as the person(s) who will aid the patient in the event of a mental health crisis (suicidal ideations/suicide attempt).  With written consent from the patient, the family member/significant other has been provided the following suicide prevention education, prior to the and/or following the discharge of the patient.  CSW completed SPE with pt.  The suicide prevention education provided includes the following:  Suicide risk factors  Suicide prevention and interventions  National Suicide Hotline telephone number  [facility] assessment telephone number  [facility] Emergency Assistance [place] and/or Residential Mobile Crisis Unit telephone number  Request made of family/significant other to:  Remove weapons (e.g., guns, rifles, knives), all items previously/currently identified as safety concern.    Remove drugs/medications (over-the-counter, prescriptions, illicit drugs), all items previously/currently identified as a safety concern.  The family member/significant other verbalizes understanding of the suicide prevention education information provided.  The family member/significant other agrees to remove the items of safety concern listed above.  Victoria [name] Reasons 07/09/2015, 3:00 PM

## 2015-07-09 NOTE — BHH Group Notes (Signed)
[facility] LCSW Aftercare Discharge Planning Group Note   07/09/2015 2:29 PM  Participation Quality:  Patient attended and participated in group discussion introducing herself and sharing her SMART goal is to "be a better listener and practice mindfulness".   Mood/Affect:  Appropriate  Depression Rating:  5  Anxiety Rating:  8  Thoughts of Suicide:  No Will you contract for safety?   NA  Current AVH:  No  Plan for Discharge/Comments:  Discharge home with family and follow up outpatient  Transportation Means: family will pick up  [place], Olevia Perches, MSW, LCSWA

## 2015-07-09 NOTE — Progress Notes (Signed)
D: Pt is pleasant and cooperative this evening. Denies SI/HI/AVH at this time. Pt c/o surgical pain rated a 6 out of 10 and requests PRN medication. Pt refuses evening dose of colace, stating that she has been having diarrhea. Pt states that her goal for today was to "get adjusted." Pt reports she went to groups on the unit. A: Emotional support and encouragement provided. Medications administered with education. q15 minute safety checks maintained. R: Pt remains free from harm.
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
## 2015-07-09 NOTE — BHH Group Notes (Signed)
[facility] Group Notes:  (Nursing/MHT/Case Management/Adjunct)  Date:  07/09/2015  Time:  1:58 PM  Type of Therapy:  Psychoeducational Skills  Participation Level:  Active  Participation Quality:  Appropriate, Attentive and Sharing  Affect:  Appropriate  Cognitive:  Alert and Appropriate  Insight:  Appropriate  Engagement in Group:  Engaged  Modes of Intervention:  Discussion, Education and Support  Summary of Progress/Problems:  [name] 07/09/2015, 1:58 PM

## 2015-07-09 NOTE — Plan of Care (Signed)
Problem: Alteration in thought process Goal: LTG-Patient has not harmed self or others in at least 2 days Outcome: Progressing No self harm

## 2015-07-09 NOTE — Progress Notes (Signed)
[facility] MD Progress Note  07/09/2015 8:35 PM Victoria [name]  MRN:  [id number]  Subjective:  Victoria [name] reports some improvement. She is still paranoid and hallucinates but the voices re getting quieter. She tolerates medications well. There are no somatic symptoms. Good group participation.  Principal Problem: MDD (major depressive disorder), recurrent, severe, with psychosis ([place]) Diagnosis:   Patient Active Problem List   Diagnosis Date Noted  . Osteoarthritis of both hips [M16.0] 07/06/2015  . Dyslipidemia [E78.5] 07/06/2015  . HTN (hypertension) [I10] 07/06/2015  . GERD (gastroesophageal reflux disease) [K21.9] 07/06/2015  . Asthma [J45.909] 07/06/2015  . MDD (major depressive disorder), recurrent, severe, with psychosis ([place]) [F33.3] 07/06/2015   Total Time spent with patient: 20 minutes  Past Psychiatric History: depression, psychosis.  Past Medical History:  Past Medical History  Diagnosis Date  . Psychotic disorder   . Hypercholesterolemia   . Asthma   . GERD (gastroesophageal reflux disease)   . Osteoarthritis of both hips   . Hypertension   . Sleep apnea     Past Surgical History  Procedure Laterality Date  . Hiatal hernia repair      Jan 2017  . Abdominal hysterectomy     Family History:  Family History  Problem Relation Age of Onset  . Heart disease Mother    Family Psychiatric  History: mother with bipolar. Social History:  History  Alcohol Use No     History  Drug Use No    Comment: No use of illict drugs.    Social History   Social History  . Marital Status: Married    Spouse Name: N/A  . Number of Children: N/A  . Years of Education: N/A   Occupational History  . home maker    Social History Main Topics  . Smoking status: Former Research scientist (life sciences)  . Smokeless tobacco: Never Used  . Alcohol Use: No  . Drug Use: No     Comment: No use of illict drugs.  . Sexual Activity: Not Currently   Other Topics Concern  . None   Social History  Narrative   Lives with husband at home.   Additional Social History:    Pain Medications: See PTA meds Prescriptions: See PTA meds Over the Counter: None reported History of alcohol / drug use?: No history of alcohol / drug abuse                    Sleep: Fair  Appetite:  Fair  Current Medications: Current Facility-Administered Medications  Medication Dose Route Frequency Provider Last Rate Last Dose  . acetaminophen (TYLENOL) tablet 650 mg  650 mg Oral Q6H PRN Hildred Priest, MD      . alum & mag hydroxide-simeth (MAALOX/MYLANTA) 200-200-20 MG/5ML suspension 30 mL  30 mL Oral Q4H PRN Hildred Priest, MD      . celecoxib (CELEBREX) capsule 200 mg  200 mg Oral BID Hildred Priest, MD   200 mg at 07/09/15 1043  . clonazePAM (KLONOPIN) tablet 0.5 mg  0.5 mg Oral TID Clovis Fredrickson, MD   0.5 mg at 07/09/15 1804  . docusate sodium (COLACE) capsule 200 mg  200 mg Oral BID Hildred Priest, MD   200 mg at 07/07/15 2230  . escitalopram (LEXAPRO) tablet 40 mg  40 mg Oral Daily Hildred Priest, MD   40 mg at 07/09/15 1041  . ezetimibe (ZETIA) tablet 10 mg  10 mg Oral Daily Hildred Priest, MD   10 mg at 07/09/15 1000  .  fluticasone (FLONASE) 50 MCG/ACT nasal spray 1 spray  1 spray Each Nare Daily Hildred Priest, MD   1 spray at 07/09/15 1041  . gabapentin (NEURONTIN) capsule 100 mg  100 mg Oral TID Clovis Fredrickson, MD   100 mg at 07/09/15 1804  . magnesium hydroxide (MILK OF MAGNESIA) suspension 30 mL  30 mL Oral Daily PRN Hildred Priest, MD      . metoprolol tartrate (LOPRESSOR) tablet 12.5 mg  12.5 mg Oral BID Hildred Priest, MD   12.5 mg at 07/09/15 1041  . OLANZapine zydis (ZYPREXA) disintegrating tablet 10 mg  10 mg Oral BID Kimya Mccahill B Delainee Tramel, MD   10 mg at 07/09/15 1043  . pantoprazole (PROTONIX) EC tablet 40 mg  40 mg Oral BID AC Hildred Priest, MD   40 mg at  07/09/15 1804  . polyethylene glycol (MIRALAX / GLYCOLAX) packet 17 g  17 g Oral Daily PRN Hildred Priest, MD      . traMADol Veatrice Bourbon) tablet 50 mg  50 mg Oral TID PRN Hildred Priest, MD   50 mg at 07/08/15 2136  . traZODone (DESYREL) tablet 100 mg  100 mg Oral QHS Aalyiah Camberos B Trea Latner, MD        Lab Results: No results found for this or any previous visit (from the past 48 hour(s)).  Physical Findings: AIMS: Facial and Oral Movements Muscles of Facial Expression: None, normal Lips and Perioral Area: None, normal Jaw: None, normal Tongue: None, normal,Extremity Movements Upper (arms, wrists, hands, fingers): None, normal Lower (legs, knees, ankles, toes): None, normal, Trunk Movements Neck, shoulders, hips: None, normal, Overall Severity Severity of abnormal movements (highest score from questions above): None, normal Incapacitation due to abnormal movements: None, normal Patient's awareness of abnormal movements (rate only patient's report): No Awareness, Dental Status Current problems with teeth and/or dentures?: No Does patient usually wear dentures?: No  CIWA:    COWS:     Musculoskeletal: Strength & Muscle Tone: within normal limits Gait & Station: normal Patient leans: N/A  Psychiatric Specialty Exam: Review of Systems  All other systems reviewed and are negative.   Blood pressure 111/83, pulse 50, temperature 98.2 F (36.8 C), temperature source Oral, resp. rate 18, height 5' 4.5" (1.638 m), weight 76.204 kg (168 lb), SpO2 100 %.Body mass index is 28.4 kg/(m^2).  General Appearance: Casual  Eye Contact::  Good  Speech:  Clear and Coherent  Volume:  Normal  Mood:  Euphoric  Affect:  Congruent  Thought Process:  Disorganized  Orientation:  Full (Time, Place, and Person)  Thought Content:  Delusions, Hallucinations: Auditory and Paranoid Ideation  Suicidal Thoughts:  No  Homicidal Thoughts:  No  Memory:  Immediate;   Fair Recent;    Fair Remote;   Fair  Judgement:  Fair  Insight:  Shallow  Psychomotor Activity:  Normal  Concentration:  Fair  Recall:  [place]  Language: Fair  Akathisia:  No  Handed:  Right  AIMS (if indicated):     Assets:  Communication Skills Desire for Improvement Financial Resources/Insurance Housing Intimacy Physical Health Resilience Social Support  ADL's:  Intact  Cognition: WNL  Sleep:  Number of Hours: 6.5   Treatment Plan Summary: Daily contact with patient to assess and evaluate symptoms and progress in treatment and Medication management   Victoria [name] is a 55 year old female with history of psychotic depression admitted for paranoid delusions and hallucinations.  1. Psychosis. She was started on Zyprexa in the emergency room.  We'll continue Zyprexa at bedtime.  2. Mood. We will continue Lexapro as prescribed by her primary provider.  3. Anxiety. The patient has been taking low-dose clonazepam at home. We'll continue.  4. Pain from recent surgery. She is on tramadol and Neurontin.  5. Tachycardia. She is on metoprolol.  6. GERD. She is on Protonix.  7. Constipation. She is on bowel regimen.  8. Dyslipidemia. She is on Zetia.   9. Metabolic syndrome. Lipid profile, TSH, hemoglobin A1c, and prolactin are all normal.  10. Disposition. She will be discharged to home with her husband. She will follow up with her regular psychiatrist in [place].   Orson Slick, MD 07/09/2015, 8:35 PM

## 2015-07-09 NOTE — [facility] Group Notes (Signed)
[facility] LCSW Group Therapy  07/09/2015 3:38 PM  Type of Therapy:  Group Therapy  Participation Level:  Active  Participation Quality:  Appropriate and Attentive  Affect:  Appropriate  Cognitive:  Alert, Appropriate and Oriented  Insight:  Engaged  Engagement in Therapy:  Engaged  Modes of Intervention:  Discussion, Socialization and Support  Summary of Progress/Problems: Patient attended and participated in group discussion appropriately introducing herself and sharing that her 'super power' is "my faith to carry on beyond and help others". Patient was very supportive and shared that [place] and would like to get connected in her community to have more support outside of her family. Patient was attentive throughout group and received positive feedback from other group members but patient was more supportive than opening up about her issues in group.   Keene Breath, MSW, LCSWA 07/09/2015, 3:38 PM

## 2015-07-10 DIAGNOSIS — F333 Major depressive disorder, recurrent, severe with psychotic symptoms: Secondary | ICD-10-CM | POA: Insufficient documentation

## 2015-07-10 MED ORDER — GABAPENTIN 100 MG PO CAPS: 100.0000 mg | ORAL_CAPSULE | Freq: Three times a day (TID) | ORAL | Status: DC

## 2015-07-10 MED ORDER — TRAZODONE HCL 100 MG PO TABS: 100.0000 mg | ORAL_TABLET | Freq: Every day | ORAL | Status: DC

## 2015-07-10 MED ORDER — OLANZAPINE 10 MG PO TBDP: 10.0000 mg | ORAL_TABLET | Freq: Two times a day (BID) | ORAL | Status: DC

## 2015-07-10 NOTE — Plan of Care (Signed)
Problem: Alteration in thought process Goal: LTG-Patient verbalizes understanding importance med regimen (Patient verbalizes understanding of importance of medication regimen and need to continue outpatient care.)  Outcome: Progressing Pt compliant with medication regimen and verbalizes importance of following regimen post discharge.

## 2015-07-10 NOTE — Plan of Care (Signed)
Problem: Ineffective individual coping Goal: LTG: Patient will report a decrease in negative feelings Outcome: Progressing Patient reports feeling better today than she did yesterday     

## 2015-07-10 NOTE — Progress Notes (Signed)
Recreation Therapy Notes  INPATIENT RECREATION TR PLAN  Patient Details Name: Tamyia Minich MRN: [id number] DOB: December 05, 1960 Today's Date: 07/10/2015  Rec Therapy Plan Is patient appropriate for Therapeutic Recreation?: Yes Treatment times per week: At least once a week TR Treatment/Interventions: 1:1 session, Group participation (Comment) (Appropriate participation in daily recreation therapy tx)  Discharge Criteria Pt will be discharged from therapy if:: Discharged, Treatment goals are met Treatment plan/goals/alternatives discussed and agreed upon by:: Patient/family  Discharge Summary Short term goals set: See Care Plan Short term goals met: Complete Progress toward goals comments: One-to-one attended Which groups?: Self-esteem, Other (Comment) (Self-expression) One-to-one attended: Self-esteem, stress management Reason goals not met: N/A Therapeutic equipment acquired: None Reason patient discharged from therapy: Discharge from hospital Pt/family agrees with progress & goals achieved: Yes Date patient discharged from therapy: 07/10/15   Leonette Monarch, LRT/CTRS 07/10/2015, 2:35 PM

## 2015-07-10 NOTE — Plan of Care (Signed)
Problem: [facility] Participation in Recreation Therapeutic Interventions Goal: STG-Patient will demonstrate improved self esteem by identif STG: Self-Esteem - Within 3 treatment sessions, patient will verbalize at least 5 positive affirmation statements in one treatment session to increase self-esteem post d/c.  Outcome: Completed/Met Date Met:  07/10/15 Treatment Session 1; Completed 1 out of 1: At approximately 9:15 am, LRT met with patient in patient room. Patient verbalized 5 positive affirmation statements. Patient reported it felt "fine". LRT encouraged patient to continue saying positive affirmation statements.  Intervention Used: I Am statements  Leonette Monarch, LRT/CTRS 02.09.17 2:33 pm Goal: STG-Other Recreation Therapy Goal (Specify) STG: Stress Management - Within 3 treatment sessions, patient will verbalize understanding of the stress management handouts in one treatment sessions to increase stress management skills post d/c.  Outcome: Completed/Met Date Met:  07/10/15 Treatment Session 1; Completed 1 out of 1: At approximately 9:15 am, LRT met with patient in patient room. LRT educated and provided patient with handouts on stress management techniques. Patient verbalized understanding. LRT encouraged patient to read over and practice the stress management techniques. Intervention Used: Stress Management handouts  Leonette Monarch, LRT/CTRS 02.09.17 2:34 pm
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
## 2015-07-10 NOTE — Progress Notes (Signed)
D: Observed pt interacting in dayroom. Patient alert and oriented x4. Patient denies SI/HI/AH. Pt endorses auditory hallucinations that are undecipherable, but pt stated "voices are less intense." Pt affect is anxious. When asked about mood pt stated "Everything's fine in the world." Pt rates anxiety 4/10 and depression 3/10. Pt indicated she feels her mood is improving. Pt c/o of chronic back pain. A: Offered active listening and support. Provided therapeutic communication. Administered scheduled medications. Encouraged pt to attend groups and actively participate in care. R: Pt pleasant and cooperative. Pt medication compliant. Will continue Q15 min. checks. Safety maintained.

## 2015-07-10 NOTE — BHH Suicide Risk Assessment (Signed)
[facility] Discharge Suicide Risk Assessment   Principal Problem: Bipolar I disorder, most recent episode manic, severe with psychotic features [facility]) Discharge Diagnoses:  Patient Active Problem List   Diagnosis Date Noted  . Osteoarthritis of both hips [M16.0] 07/06/2015  . Dyslipidemia [E78.5] 07/06/2015  . HTN (hypertension) [I10] 07/06/2015  . GERD (gastroesophageal reflux disease) [K21.9] 07/06/2015  . Asthma [J45.909] 07/06/2015  . Bipolar I disorder, most recent episode manic, severe with psychotic features ([place]) [F31.2] 07/06/2015    Total Time spent with patient: 30 minutes  Musculoskeletal: Strength & Muscle Tone: within normal limits Gait & Station: normal Patient leans: N/A  Psychiatric Specialty Exam: Review of Systems  All other systems reviewed and are negative.   Blood pressure 110/67, pulse 60, temperature 98.3 F (36.8 C), temperature source Oral, resp. rate 18, height 5' 4.5" (1.638 m), weight 76.204 kg (168 lb), SpO2 100 %.Body mass index is 28.4 kg/(m^2).  General Appearance: Casual  Eye Contact::  Good  Speech:  Clear and Coherent and Normal Rate409  Volume:  Normal  Mood:  Euthymic  Affect:  Appropriate  Thought Process:  Goal Directed  Orientation:  Full (Time, Place, and Person)  Thought Content:  Delusions, Hallucinations: Auditory and Paranoid Ideation  Suicidal Thoughts:  No  Homicidal Thoughts:  No  Memory:  Immediate;   Fair Recent;   Fair Remote;   Fair  Judgement:  Fair  Insight:  Fair  Psychomotor Activity:  Normal  Concentration:  Fair  Recall:  [employer] [place]  Language: Fair  Akathisia:  No  Handed:  Right  AIMS (if indicated):     Assets:  Communication Skills Desire for Improvement Financial Resources/Insurance Housing Intimacy Physical Health Resilience Social Support  Sleep:  Number of Hours: 6.75  Cognition: WNL  ADL's:  Intact   Mental Status Per Nursing Assessment::   On Admission:  NA  Demographic  Factors:  Caucasian  Loss Factors: NA  Historical Factors: Family history of mental illness or substance abuse and Impulsivity  Risk Reduction Factors:   Sense of responsibility to family, Living with another person, especially a relative and Positive social support  Continued Clinical Symptoms:  Bipolar Disorder:   Mixed State  Cognitive Features That Contribute To Risk:  None    Suicide Risk:  Minimal: No identifiable suicidal ideation.  Patients presenting with no risk factors but with morbid ruminations; may be classified as minimal risk based on the severity of the depressive symptoms  Follow-up Information    Follow up with [facility] Dr. Vernard Gambles.   Why:  Please arrive for your hospital follow up with Dr. Vernard Gambles for medication managment on Wednesday February 15th at 1:30pm.  Please call to confirm your appointment upon discharge   Contact information:   [address] [place], [place] [address] ph: [phone] Fax: [phone]      Please follow up.   Contact information:    [address] [place], [place] [address] [phone]      Follow up with [facility].   Why:  Please arrive Monday February 13th at 10am with Lauren for an assessment for therapy.  Please arrive about 30 minutes early to complete requisite paperwork and please call to reschedule if necessary   Contact information:   [place],  [address] [phone] fax: [phone]      Plan Of Care/Follow-up recommendations:  Activity:  As tolerated. Diet:  Low sodium heart healthy. Other:  Keep follow-up appointments.  Orson Slick, MD 07/10/2015, 11:08 AM

## 2015-07-10 NOTE — Progress Notes (Signed)
  [facility] Adult Case Management Discharge Plan :  Will you be returning to the same living situation after discharge:  Yes,  pt will be returning home to [place], [place] to live with her husband At discharge, do you have transportation home?: Yes,  pt will be picked up by her husband Do you have the ability to pay for your medications: Yes,  pt will be provided with prescriptions at discharge  Release of information consent forms completed and in the chart;  Patient's signature needed at discharge.  Patient to Follow up at: Follow-up Information    Follow up with [facility] Dr. Vernard Gambles.   Why:  Please arrive for your hospital follow up with Dr. Vernard Gambles for medication managment on Wednesday February 15th at 1:30pm.  Please call to confirm your appointment upon discharge   Contact information:   [address] [place], [place] [address] ph: [phone] Fax: [phone]      Please follow up.   Contact information:    [address] [place], [place] [address] [phone]      Follow up with [facility].   Why:  Please arrive Monday February 13th at 10am with Lauren for an assessment for therapy.  Please arrive about 30 minutes early to complete requisite paperwork and please call to reschedule if necessary   Contact information:       [address], [place] [address] [phone] fax: [phone]      Next level of care provider has access to [place] and Suicide Prevention discussed: Yes,  complete with pt  Have you used any form of tobacco in the last 30 days? (Cigarettes, Smokeless Tobacco, Cigars, and/or Pipes): No  Has patient been referred to the Quitline?: N/A patient is not a smoker  Patient has been referred for addiction treatment: Pt. refused referral  Alphonse Guild [place] 07/10/2015, 11:09 AM

## 2015-07-10 NOTE — Progress Notes (Signed)
D:  Patient expresses readiness for discharge today.  Patient denies any pain currently.  Patient denies suicidal ideation or homicidal ideation currently.  Patient reports vague auditory or visual hallucinations currently. A:  Medications and instructions for their use were reviewed with the patient and she voiced understanding.  Discharge instructions and follow up were reviewed with the patient.  Patient's belongings were returned upon her leaving the unit. R:  Patient signed for the return of her belongings.  Patient was cooperative with the discharge process.  Patient expressed understanding of discharge instructions, follow up, medications and their use.  Patient was escorted off the unit.  Patient remains safe at the time of discharge.

## 2015-07-10 NOTE — Plan of Care (Signed)
Problem: Ineffective individual coping Goal: STG-Increase in ability to manage activities of daily living Outcome: Progressing Patient is managing her activities of daily living currently

## 2015-08-05 ENCOUNTER — Other Ambulatory Visit: Payer: Self-pay | Admitting: Psychiatry

## 2016-01-06 DIAGNOSIS — M25551 Pain in right hip: Secondary | ICD-10-CM | POA: Insufficient documentation

## 2016-01-06 DIAGNOSIS — G629 Polyneuropathy, unspecified: Secondary | ICD-10-CM | POA: Insufficient documentation

## 2016-01-20 LAB — HM COLONOSCOPY

## 2016-05-03 DIAGNOSIS — M461 Sacroiliitis, not elsewhere classified: Secondary | ICD-10-CM | POA: Insufficient documentation

## 2017-03-28 ENCOUNTER — Other Ambulatory Visit
Admission: RE | Admit: 2017-03-28 | Discharge: 2017-03-28 | Disposition: A | Discharge status: Home / Self Care | Payer: Medicare HMO | Source: Ambulatory Visit | Attending: Foot & Ankle Surgery | Admitting: Foot & Ankle Surgery

## 2017-03-28 DIAGNOSIS — B351 Tinea unguium: Secondary | ICD-10-CM | POA: Diagnosis present

## 2017-03-28 LAB — HEPATIC FUNCTION PANEL
ALBUMIN: 4 g/dL (ref 3.5–5.0)
ALT: 38 U/L (ref 14–54)
AST: 32 U/L (ref 15–41)
Alkaline Phosphatase: 78 U/L (ref 38–126)
BILIRUBIN TOTAL: 0.7 mg/dL (ref 0.3–1.2)
Bilirubin, Direct: <0.1 mg/dL — ABNORMAL LOW (ref 0.1–0.5)
TOTAL PROTEIN: 7.2 g/dL (ref 6.5–8.1)

## 2017-09-21 ENCOUNTER — Other Ambulatory Visit: Payer: Self-pay | Admitting: Family Medicine

## 2017-09-21 DIAGNOSIS — Z1231 Encounter for screening mammogram for malignant neoplasm of breast: Secondary | ICD-10-CM

## 2017-10-07 ENCOUNTER — Ambulatory Visit
Admission: RE | Admit: 2017-10-07 | Discharge: 2017-10-07 | Disposition: A | Discharge status: Home / Self Care | Payer: Medicare HMO | Source: Ambulatory Visit | Attending: Family Medicine | Admitting: Family Medicine

## 2017-10-07 DIAGNOSIS — Z1231 Encounter for screening mammogram for malignant neoplasm of breast: Secondary | ICD-10-CM | POA: Diagnosis not present

## 2017-10-10 ENCOUNTER — Other Ambulatory Visit: Payer: Self-pay | Admitting: Family Medicine

## 2017-10-10 ENCOUNTER — Ambulatory Visit
Admission: RE | Admit: 2017-10-10 | Discharge: 2017-10-10 | Disposition: A | Discharge status: Home / Self Care | Payer: Medicare HMO | Source: Ambulatory Visit | Attending: Family Medicine | Admitting: Family Medicine

## 2017-10-10 DIAGNOSIS — R059 Cough, unspecified: Secondary | ICD-10-CM

## 2017-10-10 DIAGNOSIS — R05 Cough: Secondary | ICD-10-CM | POA: Insufficient documentation

## 2017-10-11 ENCOUNTER — Inpatient Hospital Stay
Admission: RE | Admit: 2017-10-11 | Discharge: 2017-10-11 | Disposition: A | Discharge status: Home / Self Care | Payer: Self-pay | Source: Ambulatory Visit | Attending: *Deleted | Admitting: *Deleted

## 2017-10-11 ENCOUNTER — Other Ambulatory Visit: Payer: Self-pay | Admitting: *Deleted

## 2017-10-11 DIAGNOSIS — Z9289 Personal history of other medical treatment: Secondary | ICD-10-CM

## 2017-10-25 DIAGNOSIS — Z0289 Encounter for other administrative examinations: Secondary | ICD-10-CM | POA: Insufficient documentation

## 2017-10-25 DIAGNOSIS — M5412 Radiculopathy, cervical region: Secondary | ICD-10-CM | POA: Insufficient documentation

## 2017-12-30 DIAGNOSIS — R200 Anesthesia of skin: Secondary | ICD-10-CM | POA: Insufficient documentation

## 2018-04-24 DIAGNOSIS — M4316 Spondylolisthesis, lumbar region: Secondary | ICD-10-CM | POA: Insufficient documentation

## 2018-05-11 DIAGNOSIS — D039 Melanoma in situ, unspecified: Secondary | ICD-10-CM

## 2018-05-11 HISTORY — DX: Melanoma in situ, unspecified: D03.9

## 2018-05-31 HISTORY — PX: LAPAROSCOPY: SHX197

## 2018-08-02 DIAGNOSIS — M47812 Spondylosis without myelopathy or radiculopathy, cervical region: Secondary | ICD-10-CM | POA: Insufficient documentation

## 2019-01-02 DIAGNOSIS — L989 Disorder of the skin and subcutaneous tissue, unspecified: Secondary | ICD-10-CM | POA: Insufficient documentation

## 2019-07-17 DIAGNOSIS — M48062 Spinal stenosis, lumbar region with neurogenic claudication: Secondary | ICD-10-CM | POA: Insufficient documentation

## 2019-07-25 DIAGNOSIS — N1831 Chronic kidney disease, stage 3a: Secondary | ICD-10-CM | POA: Insufficient documentation

## 2019-07-30 DIAGNOSIS — G43909 Migraine, unspecified, not intractable, without status migrainosus: Secondary | ICD-10-CM | POA: Insufficient documentation

## 2019-10-16 ENCOUNTER — Other Ambulatory Visit: Payer: Self-pay | Admitting: Family Medicine

## 2019-10-16 DIAGNOSIS — Z1231 Encounter for screening mammogram for malignant neoplasm of breast: Secondary | ICD-10-CM

## 2019-10-22 ENCOUNTER — Inpatient Hospital Stay: Admission: RE | Admit: 2019-10-22 | Payer: Medicare HMO | Source: Ambulatory Visit

## 2019-11-14 ENCOUNTER — Inpatient Hospital Stay
Admission: EM | Admit: 2019-11-14 | Discharge: 2019-11-19 | DRG: 885 | Disposition: A | Discharge status: Home / Self Care | Payer: Medicare HMO | Source: Intra-hospital | Attending: Psychiatry | Admitting: Psychiatry

## 2019-11-14 ENCOUNTER — Emergency Department
Admission: EM | Admit: 2019-11-14 | Discharge: 2019-11-14 | Disposition: A | Discharge status: Other Acute Inpatient Hospital | Payer: Medicare HMO | Attending: Student | Admitting: Student

## 2019-11-14 ENCOUNTER — Other Ambulatory Visit: Payer: Self-pay

## 2019-11-14 DIAGNOSIS — F32A Depression, unspecified: Secondary | ICD-10-CM

## 2019-11-14 DIAGNOSIS — Z7951 Long term (current) use of inhaled steroids: Secondary | ICD-10-CM | POA: Diagnosis not present

## 2019-11-14 DIAGNOSIS — F329 Major depressive disorder, single episode, unspecified: Secondary | ICD-10-CM | POA: Diagnosis present

## 2019-11-14 DIAGNOSIS — Y929 Unspecified place or not applicable: Secondary | ICD-10-CM | POA: Insufficient documentation

## 2019-11-14 DIAGNOSIS — G894 Chronic pain syndrome: Secondary | ICD-10-CM | POA: Diagnosis present

## 2019-11-14 DIAGNOSIS — I10 Essential (primary) hypertension: Secondary | ICD-10-CM | POA: Insufficient documentation

## 2019-11-14 DIAGNOSIS — Y939 Activity, unspecified: Secondary | ICD-10-CM | POA: Insufficient documentation

## 2019-11-14 DIAGNOSIS — Z79899 Other long term (current) drug therapy: Secondary | ICD-10-CM

## 2019-11-14 DIAGNOSIS — X58XXXA Exposure to other specified factors, initial encounter: Secondary | ICD-10-CM | POA: Insufficient documentation

## 2019-11-14 DIAGNOSIS — F333 Major depressive disorder, recurrent, severe with psychotic symptoms: Secondary | ICD-10-CM | POA: Diagnosis not present

## 2019-11-14 DIAGNOSIS — Z87891 Personal history of nicotine dependence: Secondary | ICD-10-CM | POA: Insufficient documentation

## 2019-11-14 DIAGNOSIS — Y999 Unspecified external cause status: Secondary | ICD-10-CM | POA: Diagnosis not present

## 2019-11-14 DIAGNOSIS — E7800 Pure hypercholesterolemia, unspecified: Secondary | ICD-10-CM | POA: Diagnosis present

## 2019-11-14 DIAGNOSIS — F410 Panic disorder [episodic paroxysmal anxiety] without agoraphobia: Secondary | ICD-10-CM | POA: Diagnosis not present

## 2019-11-14 DIAGNOSIS — M797 Fibromyalgia: Secondary | ICD-10-CM | POA: Diagnosis present

## 2019-11-14 DIAGNOSIS — Z90710 Acquired absence of both cervix and uterus: Secondary | ICD-10-CM | POA: Diagnosis not present

## 2019-11-14 DIAGNOSIS — Z20822 Contact with and (suspected) exposure to covid-19: Secondary | ICD-10-CM | POA: Insufficient documentation

## 2019-11-14 DIAGNOSIS — G4730 Sleep apnea, unspecified: Secondary | ICD-10-CM | POA: Diagnosis present

## 2019-11-14 DIAGNOSIS — Z8249 Family history of ischemic heart disease and other diseases of the circulatory system: Secondary | ICD-10-CM

## 2019-11-14 DIAGNOSIS — J45909 Unspecified asthma, uncomplicated: Secondary | ICD-10-CM | POA: Diagnosis not present

## 2019-11-14 DIAGNOSIS — R440 Auditory hallucinations: Secondary | ICD-10-CM | POA: Insufficient documentation

## 2019-11-14 DIAGNOSIS — S0502XA Injury of conjunctiva and corneal abrasion without foreign body, left eye, initial encounter: Secondary | ICD-10-CM | POA: Diagnosis not present

## 2019-11-14 DIAGNOSIS — R45851 Suicidal ideations: Secondary | ICD-10-CM | POA: Diagnosis present

## 2019-11-14 DIAGNOSIS — F419 Anxiety disorder, unspecified: Secondary | ICD-10-CM | POA: Diagnosis present

## 2019-11-14 DIAGNOSIS — F323 Major depressive disorder, single episode, severe with psychotic features: Secondary | ICD-10-CM | POA: Diagnosis present

## 2019-11-14 DIAGNOSIS — G4700 Insomnia, unspecified: Secondary | ICD-10-CM | POA: Diagnosis present

## 2019-11-14 DIAGNOSIS — F29 Unspecified psychosis not due to a substance or known physiological condition: Principal | ICD-10-CM | POA: Diagnosis present

## 2019-11-14 DIAGNOSIS — K2100 Gastro-esophageal reflux disease with esophagitis, without bleeding: Secondary | ICD-10-CM | POA: Diagnosis not present

## 2019-11-14 DIAGNOSIS — J449 Chronic obstructive pulmonary disease, unspecified: Secondary | ICD-10-CM | POA: Diagnosis present

## 2019-11-14 DIAGNOSIS — K219 Gastro-esophageal reflux disease without esophagitis: Secondary | ICD-10-CM | POA: Diagnosis present

## 2019-11-14 LAB — CBC
HCT: 41.5 % (ref 36.0–46.0)
Hemoglobin: 14 g/dL (ref 12.0–15.0)
MCH: 29.3 pg (ref 26.0–34.0)
MCHC: 33.7 g/dL (ref 30.0–36.0)
MCV: 86.8 fL (ref 80.0–100.0)
Platelets: 262 10*3/uL (ref 150–400)
RBC: 4.78 MIL/uL (ref 3.87–5.11)
RDW: 14.1 % (ref 11.5–15.5)
WBC: 5.7 10*3/uL (ref 4.0–10.5)
nRBC: 0 % (ref 0.0–0.2)

## 2019-11-14 LAB — COMPREHENSIVE METABOLIC PANEL
ALT: 19 U/L (ref 0–44)
AST: 21 U/L (ref 15–41)
Albumin: 4.2 g/dL (ref 3.5–5.0)
Alkaline Phosphatase: 67 U/L (ref 38–126)
Anion gap: 8 (ref 5–15)
BUN: 16 mg/dL (ref 6–20)
CO2: 27 mmol/L (ref 22–32)
Calcium: 9.3 mg/dL (ref 8.9–10.3)
Chloride: 105 mmol/L (ref 98–111)
Creatinine, Ser: 1.17 mg/dL — ABNORMAL HIGH (ref 0.44–1.00)
GFR calc Af Amer: 59 mL/min — ABNORMAL LOW (ref >60)
GFR calc non Af Amer: 51 mL/min — ABNORMAL LOW (ref >60)
Glucose, Bld: 128 mg/dL — ABNORMAL HIGH (ref 70–99)
Potassium: 4.2 mmol/L (ref 3.5–5.1)
Sodium: 140 mmol/L (ref 135–145)
Total Bilirubin: 0.9 mg/dL (ref 0.3–1.2)
Total Protein: 7.3 g/dL (ref 6.5–8.1)

## 2019-11-14 LAB — URINE DRUG SCREEN, QUALITATIVE (ARMC ONLY)
Amphetamines, Ur Screen: NOT DETECTED
Barbiturates, Ur Screen: NOT DETECTED
Benzodiazepine, Ur Scrn: NOT DETECTED
Cannabinoid 50 Ng, Ur ~~LOC~~: NOT DETECTED
Cocaine Metabolite,Ur ~~LOC~~: NOT DETECTED
MDMA (Ecstasy)Ur Screen: NOT DETECTED
Methadone Scn, Ur: NOT DETECTED
Opiate, Ur Screen: NOT DETECTED
Phencyclidine (PCP) Ur S: NOT DETECTED
Tricyclic, Ur Screen: POSITIVE — AB

## 2019-11-14 LAB — ETHANOL: Alcohol, Ethyl (B): <10 mg/dL (ref <10)

## 2019-11-14 LAB — SARS CORONAVIRUS 2 BY RT PCR (HOSPITAL ORDER, PERFORMED IN ~~LOC~~ HOSPITAL LAB): SARS Coronavirus 2: NEGATIVE

## 2019-11-14 LAB — ACETAMINOPHEN LEVEL: Acetaminophen (Tylenol), Serum: <10 ug/mL — ABNORMAL LOW (ref 10–30)

## 2019-11-14 MED ORDER — LORAZEPAM 0.5 MG PO TABS
0.5000 mg | ORAL_TABLET | Freq: Once | ORAL | Status: AC
  Administered 2019-11-14: 0.5 mg via ORAL
  Filled 2019-11-14: qty 1

## 2019-11-14 MED ORDER — HALOPERIDOL 1 MG PO TABS
3.0000 mg | ORAL_TABLET | Freq: Every day | ORAL | Status: DC
  Administered 2019-11-15: 3 mg via ORAL
  Filled 2019-11-14: qty 3

## 2019-11-14 MED ORDER — POLYVINYL ALCOHOL 1.4 % OP SOLN
1.0000 [drp] | OPHTHALMIC | Status: DC | PRN
  Administered 2019-11-15 – 2019-11-17 (×8): 1 [drp] via OPHTHALMIC
  Filled 2019-11-14: qty 15

## 2019-11-14 MED ORDER — CIPROFLOXACIN HCL 0.3 % OP SOLN
2.0000 [drp] | Freq: Four times a day (QID) | OPHTHALMIC | Status: DC
  Administered 2019-11-14: 2 [drp] via OPHTHALMIC
  Filled 2019-11-14: qty 2.5

## 2019-11-14 MED ORDER — MAGNESIUM HYDROXIDE 400 MG/5ML PO SUSP: 30.0000 mL | Freq: Every day | ORAL | Status: DC | PRN

## 2019-11-14 MED ORDER — CLONAZEPAM 1 MG PO TABS
1.0000 mg | ORAL_TABLET | Freq: Two times a day (BID) | ORAL | Status: DC
  Administered 2019-11-15: 1 mg via ORAL
  Filled 2019-11-14: qty 1

## 2019-11-14 MED ORDER — FLUORESCEIN SODIUM 1 MG OP STRP
2.0000 | ORAL_STRIP | Freq: Once | OPHTHALMIC | Status: DC
  Filled 2019-11-14: qty 2

## 2019-11-14 MED ORDER — ACETAMINOPHEN 325 MG PO TABS
650.0000 mg | ORAL_TABLET | Freq: Four times a day (QID) | ORAL | Status: DC | PRN
  Administered 2019-11-15: 650 mg via ORAL
  Filled 2019-11-14: qty 2

## 2019-11-14 MED ORDER — TETRACAINE HCL 0.5 % OP SOLN
1.0000 [drp] | Freq: Once | OPHTHALMIC | Status: AC
  Administered 2019-11-15: 1 [drp] via OPHTHALMIC
  Filled 2019-11-14: qty 4

## 2019-11-14 MED ORDER — FLUORESCEIN SODIUM 1 MG OP STRP
2.0000 | ORAL_STRIP | Freq: Once | OPHTHALMIC | Status: DC
  Filled 2019-11-14 (×2): qty 2

## 2019-11-14 MED ORDER — BENZTROPINE MESYLATE 1 MG PO TABS
0.5000 mg | ORAL_TABLET | Freq: Every day | ORAL | Status: DC
  Administered 2019-11-15 – 2019-11-16 (×2): 0.5 mg via ORAL
  Filled 2019-11-14 (×2): qty 1

## 2019-11-14 MED ORDER — BENZTROPINE MESYLATE 1 MG PO TABS
0.5000 mg | ORAL_TABLET | Freq: Every day | ORAL | Status: DC
  Administered 2019-11-14: 0.5 mg via ORAL
  Filled 2019-11-14: qty 1

## 2019-11-14 MED ORDER — CIPROFLOXACIN HCL 0.3 % OP SOLN
2.0000 [drp] | Freq: Four times a day (QID) | OPHTHALMIC | Status: DC
  Administered 2019-11-15 – 2019-11-19 (×13): 2 [drp] via OPHTHALMIC
  Filled 2019-11-14: qty 2.5

## 2019-11-14 MED ORDER — HALOPERIDOL 2 MG PO TABS
3.0000 mg | ORAL_TABLET | Freq: Every day | ORAL | Status: DC
  Administered 2019-11-14: 3 mg via ORAL
  Filled 2019-11-14: qty 1

## 2019-11-14 MED ORDER — CLONAZEPAM 0.5 MG PO TABS: 1.0000 mg | ORAL_TABLET | Freq: Two times a day (BID) | ORAL | Status: DC

## 2019-11-14 MED ORDER — VENLAFAXINE HCL ER 37.5 MG PO CP24
37.5000 mg | ORAL_CAPSULE | Freq: Every day | ORAL | Status: DC
  Filled 2019-11-14: qty 1

## 2019-11-14 MED ORDER — ACETAMINOPHEN 500 MG PO TABS
1000.0000 mg | ORAL_TABLET | Freq: Once | ORAL | Status: AC
  Administered 2019-11-14: 1000 mg via ORAL
  Filled 2019-11-14: qty 2

## 2019-11-14 MED ORDER — TETRACAINE HCL 0.5 % OP SOLN
1.0000 [drp] | Freq: Once | OPHTHALMIC | Status: AC
  Administered 2019-11-14: 1 [drp] via OPHTHALMIC
  Filled 2019-11-14 (×2): qty 4

## 2019-11-14 MED ORDER — ALUM & MAG HYDROXIDE-SIMETH 200-200-20 MG/5ML PO SUSP
30.0000 mL | ORAL | Status: DC | PRN
  Administered 2019-11-19: 30 mL via ORAL
  Filled 2019-11-14: qty 30

## 2019-11-14 MED ORDER — VENLAFAXINE HCL ER 37.5 MG PO CP24
37.5000 mg | ORAL_CAPSULE | Freq: Every day | ORAL | Status: DC
  Administered 2019-11-15: 37.5 mg via ORAL
  Filled 2019-11-14: qty 1

## 2019-11-14 MED ORDER — TRAZODONE HCL 100 MG PO TABS
100.0000 mg | ORAL_TABLET | Freq: Every day | ORAL | Status: DC
  Filled 2019-11-14: qty 1

## 2019-11-14 NOTE — ED Notes (Signed)
Pt transported to interview room with TTS

## 2019-11-14 NOTE — BH Assessment (Addendum)
Assessment Note  Victoria [name] is an 59 y.o. female who presents to [facility] ED voluntarily for treatment. Per triage note, Pt arrives via POV from home for reports of feeling suicidal x 2 weeks. Pt tearful in triage. Pt states she has been dealing with "black magic and voodoo" and states "this sounds crazy but it is real". PT cooperative.   During TTS assessment pt is alert and oriented x 3, anxious and paranoid but cooperative, and mood-congruent with affect. Pt does not appear to be responding to internal or external stimuli. Pt confirmed the information provided to the triage RN and began to cry stating "I've been cursed with black magic and I do not feel well". 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 Pt reports experiencing fleeting SI since being cursed by her coworker years ago but expressed the symptoms to increase in the last month. Pt reported an attempt to end her life using a knife last week while crying unconsolably. Pt reports endorsing somatic symptoms (pain in her right eye) and identified the symptom, the curse and working for [employer] as the triggers to her current SI. Pt reports experiencing AH (tree's and birds talking) and VH (Demons) for over a month. Pt reports a MH hx of Bipolar and noncompliance with her medications for two weeks due to the pain in her eye. Pt reports an INPT hx in 2014 at a hospital in [place] but was unable to remember the name. Pt reports receiving current OPT services through [employer] in [place].  Pt denies any previous or current SA. Pt reports a family hx of MH (mom & sisters) but denies a hx of SA. Pt reports to currently endorse SI/AH/VH but denies HI and is unable to contract for safety stating "I need help". Pt provided her husband Darnell Level [phone]) as a collateral contact.   Per Dr. Janese Banks pt meets criteria for inpatient treatment    Diagnosis: Bipolar current or most recent episode depressed with psychotic features  Past Medical History:  Past Medical History:  Diagnosis Date  . Asthma   . GERD (gastroesophageal reflux disease)   . Hypercholesterolemia   . Hypertension   . Osteoarthritis of both hips   . Psychotic disorder ([place])   . Sleep apnea     Past Surgical History:  Procedure Laterality Date  . ABDOMINAL HYSTERECTOMY    . BREAST EXCISIONAL BIOPSY Right 1997   neg  . HIATAL HERNIA REPAIR     Jan 2017    Family History:  Family History  Problem Relation Age of Onset  . Heart disease Mother     Social History:  reports that she has quit smoking. She has never used smokeless tobacco. She reports that she does not drink alcohol and does not use drugs.  Additional Social History:  Alcohol / Drug Use Pain Medications: see mar Prescriptions: see mar Over the Counter: see mar History of alcohol / drug use?: No history of alcohol / drug abuse  CIWA: CIWA-Ar BP: 123/65 Pulse Rate: 71 COWS:    Allergies:  Allergies  Allergen Reactions  . Amoxicillin   . Aspirin   . Codeine   . Penicillins   . Sulfa Antibiotics     Home Medications: (Not in a hospital admission)   OB/GYN Status:  No LMP recorded. Patient has had a hysterectomy.  General Assessment Data Location of Assessment: [facility] ED TTS Assessment: In system  Is this a Tele or Face-to-Face Assessment?: Face-to-Face Is this an Initial Assessment or a Re-assessment for this encounter?: Initial Assessment Patient Accompanied by:: N/A Language Other than English: No Living Arrangements: Other (Comment) (private home ) What gender do you identify as?: Female Marital status: Married [address] name: unknown  Pregnancy Status: No Living Arrangements: Spouse/significant other (husband Gaffer)) Can pt return to current living arrangement?: Yes Admission Status: Voluntary Is patient capable of signing voluntary admission?: Yes Referral  Source: Self/Family/Friend Insurance type: Ambulance person Exam ([place]) Medical Exam completed: Yes  Crisis Care Plan Living Arrangements: Spouse/significant other (husband Gaffer)) Legal Guardian: Other: (self ) Name of Psychiatrist: Science writer, [place] Name of Therapist: Science writer, [place]    Education Status Is patient currently in school?: No  Risk to self with the past 6 months Suicidal Ideation: Yes-Currently Present Has patient been a risk to self within the past 6 months prior to admission? : No Suicidal Intent: Yes-Currently Present Has patient had any suicidal intent within the past 6 months prior to admission? : No Is patient at risk for suicide?: Yes Suicidal Plan?: Yes-Currently Present Has patient had any suicidal plan within the past 6 months prior to admission? : No Specify Current Suicidal Plan: Stab herself with a knife  Access to Means: Yes Specify Access to Suicidal Means: knives in the home  What has been your use of drugs/alcohol within the last 12 months?: None reported  How many times?: 0 Other Self Harm Risks: None reported Triggers for Past Attempts: None known Intentional Self Injurious Behavior: None Family Suicide History: No Recent stressful life event(s): Other (Comment) Persecutory voices/beliefs?: Yes Depression: Yes Depression Symptoms: Tearfulness Substance abuse history and/or treatment for substance abuse?: No Suicide prevention information given to non-admitted patients: Yes  Risk to Others within the past 6 months Homicidal Ideation: No Does patient have any lifetime risk of violence toward others beyond the six months prior to admission? : No Thoughts of Harm to Others: No Current Homicidal Intent: No Current Homicidal Plan: No Access to Homicidal Means: No Identified Victim: n/a History of harm to others?: No Assessment of Violence: None Noted Violent Behavior Description:  n/a Does patient have access to weapons?: Yes (Comment) (knives ) Criminal Charges Pending?: No Does patient have a court date: No Is patient on probation?: No  Psychosis Hallucinations: Auditory, Visual, With command Delusions: Persecutory  Mental Status Report Appearance/Hygiene: In scrubs Eye Contact: Good Motor Activity: Freedom of movement Speech: Unremarkable Level of Consciousness: Alert, Crying Mood: Depressed, Anxious, Labile, Pleasant Affect: Anxious, Depressed, Labile Anxiety Level: Moderate Thought Processes: Relevant Judgement: Partial Orientation: Person, Place, Situation, Time Obsessive Compulsive Thoughts/Behaviors: Moderate  Cognitive Functioning Concentration: Good Is patient IDD: No Impulse Control: Fair Appetite: Poor Have you had any weight changes? : Loss Amount of the weight change? (lbs): 6 lbs Sleep: Decreased Total Hours of Sleep:  (2-3 Hours ) Vegetative Symptoms: None     Prior Inpatient Therapy Prior Inpatient Therapy: Yes Prior Therapy Dates: 2014 Prior Therapy Facilty/Provider(s): [place] (pt reports to be unsure of agency name ) Reason for Treatment: [place]  Prior Outpatient Therapy Prior Outpatient Therapy: Yes Prior Therapy Dates: CURRENT  Prior Therapy Facilty/Provider(s): Science writer [facility] ) Reason for Treatment: MH Does patient have an ACCT team?: No Does patient have Intensive In-House Services?  : No Does patient have [facility] services? : No Does patient have P4CC services?: Unknown  ADL Screening (condition at time of admission) Is the patient  deaf or have difficulty hearing?: No Does the patient have difficulty seeing, even when wearing glasses/contacts?: No Does the patient have difficulty concentrating, remembering, or making decisions?: No Does the patient have difficulty dressing or bathing?: No Does the patient have difficulty walking or climbing stairs?: No Weakness of Legs: None Weakness of  Arms/Hands: None  Home Assistive Devices/Equipment Home Assistive Devices/Equipment: None  Therapy Consults (therapy consults require a physician order) PT Evaluation Needed: No OT Evalulation Needed: No SLP Evaluation Needed: No Abuse/Neglect Assessment (Assessment to be complete while patient is alone) Abuse/Neglect Assessment Can Be Completed: Yes Physical Abuse: Denies Verbal Abuse: Denies Sexual Abuse: Denies Exploitation of patient/patient's resources: Denies Self-Neglect: Denies Values / Beliefs Cultural Requests During Hospitalization: None Spiritual Requests During Hospitalization: None Consults Spiritual Care Consult Needed: No Transition of Care Team Consult Needed: No Advance Directives (For Healthcare) Does Patient Have a Medical Advance Directive?: No          Disposition:  Disposition Initial Assessment Completed for this Encounter: Yes Patient referred to: Other (Comment)  On Site Evaluation by:   Reviewed with Physician:    Shanon Ace 11/14/2019 5:43 PM

## 2019-11-14 NOTE — ED Notes (Signed)
VOL, pend placement

## 2019-11-14 NOTE — BH Assessment (Signed)
PATIENT BED AVAILABLE PENDING NEGATIVE COVID RESULTS AND AFTER 10:30PM  Patient is to be admitted to [facility] by Psychiatric Nurse Practitioner Caroline Sauger.  Attending Physician will be Dr. Weber Cooks.   Patient has been assigned to room 306, by [facility] Charge Nurse [place].   Intake Paper Work has been signed and placed on patient chart.  ER staff is aware of the admission:  [facility] ER Secretary    Dr. Archie Balboa, ER MD   Remo Lipps Patient's Nurse   Earlie Server Patient Access.

## 2019-11-14 NOTE — Consult Note (Addendum)
[facility] Consult   Reason for Consult:  Depression psychosis and SI and possible plans needs admission Referring Physician:   ED MD  Patient Identification: Victoria [name] MRN:  [id number] Principal Diagnosis:  Major Depression severe recurrent with psychosis   Generalized anxiety   Diagnosis:  Problem   SI, off of meds, symptoms are returning, cannot stay safe   Total Time spent with patient:   45-50 minutes   Subjective:   Victoria [name] is a 59 y.o. female patient admitted with worening major depression and psychosis and anxiety     HPI: Off meds for at least three months.  She is not reliable with regimen.  She has been on multiple combinations.  She was seen three months ago but cannot recall the clinic   [place] and he brought her here on voluntary basis --  She is experiencing worsening symptoms and relapse  She is hearing voices telling her strange things and to harm herself ---has severe anxiety with excessive worry, nervousness tension frustration, somatic issues autonomic issues ---doom gloom dread ---panic feelings, shakes and tremors --frozen feelings, numb feelings, relative disassociation  She has long history of major depression that is now back:  Depressed mood, crying spells --hopeless helpless feelings, lack of energy, motivation, concentration--lack of sleep of three cycles ---anhedonia, weight gain and loss SI with possible plans.  Feels fatigue and has diurnal variation --cannot get tasks done   She seeks hospitalization at this time and seeks newer and more regular medicines   She cannot recall which meds worked best on her long list   Past Psychiatric History:  On and off medication management.  No regular therapy, day treatment  Risk to Self:  high -- For self harm at this time  Risk to Others:  --none at this time  Prior Inpatient Therapy:  ---2016  Prior Outpatient Therapy:  on and off not regular at  this time   Past Medical History:  Past Medical History:  Diagnosis Date  . Asthma   . GERD (gastroesophageal reflux disease)   . Hypercholesterolemia   . Hypertension   . Osteoarthritis of both hips   . Psychotic disorder ([place])   . Sleep apnea     Past Surgical History:  Procedure Laterality Date  . ABDOMINAL HYSTERECTOMY    . BREAST EXCISIONAL BIOPSY Right 1997   neg  . HIATAL HERNIA REPAIR     Jan 2017   Family History:   She says her brother and parents and sister --have various forms of psychosis, anxiety and mania ---     Family History  Problem Relation Age of Onset  . Heart disease Mother    Family Psychiatric  History: --see above    Current legal issues none  Educational --HS   Substance drug history --none reported    Social History:  Currently not working regularly    Social History   Substance and Sexual Activity  Alcohol Use No  . Alcohol/week: 0.0 standard drinks     Social History   Substance and Sexual Activity  Drug Use No   Comment: No use of illict drugs.    Social History   Socioeconomic History  . Marital status: Married    Spouse name: Not on file  . Number of children: Not on file  . Years of education: Not on file  . Highest education level: Not on file  Occupational History  . Occupation: home maker  Tobacco Use  .  Smoking status: Former Research scientist (life sciences)  . Smokeless tobacco: Never Used  Substance and Sexual Activity  . Alcohol use: No    Alcohol/week: 0.0 standard drinks  . Drug use: No    Comment: No use of illict drugs.  . Sexual activity: Not Currently  Other Topics Concern  . Not on file  Social History Narrative   Lives with husband at home.   Social Determinants of Health   Financial Resource Strain:   . Difficulty of Paying Living Expenses:   Food Insecurity:   . Worried About Charity fundraiser in the Last Year:   . Arboriculturist in the Last Year:   Transportation Needs:   . Film/video editor  (Medical):   Marland Kitchen Lack of Transportation (Non-Medical):   Physical Activity:   . Days of Exercise per Week:   . Minutes of Exercise per Session:   Stress:   . Feeling of Stress :   Social Connections:   . Frequency of Communication with Friends and Family:   . Frequency of Social Gatherings with Friends and Family:   . Attends Religious Services:   . Active Member of Clubs or Organizations:   . 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 Amoxicillin   . Aspirin   . Codeine   . Penicillins   . Sulfa Antibiotics     Labs:  Results for orders placed or performed during the hospital encounter of 11/14/19 (from the past 48 hour(s))  Comprehensive metabolic panel     Status: Abnormal   Collection Time: 11/14/19 12:55 PM  Result Value Ref Range   Sodium 140 135 - 145 mmol/L   Potassium 4.2 3.5 - 5.1 mmol/L   Chloride 105 98 - 111 mmol/L   CO2 27 22 - 32 mmol/L   Glucose, Bld 128 (H) 70 - 99 mg/dL    Comment: Glucose reference range applies only to samples taken after fasting for at least 8 hours.   BUN 16 6 - 20 mg/dL   Creatinine, Ser 1.17 (H) 0.44 - 1.00 mg/dL   Calcium 9.3 8.9 - 10.3 mg/dL   Total Protein 7.3 6.5 - 8.1 g/dL   Albumin 4.2 3.5 - 5.0 g/dL   AST 21 15 - 41 U/L   ALT 19 0 - 44 U/L   Alkaline Phosphatase 67 38 - 126 U/L   Total Bilirubin 0.9 0.3 - 1.2 mg/dL   GFR calc non Af Amer 51 (L) >60 mL/min   GFR calc Af Amer 59 (L) >60 mL/min   Anion gap 8 5 - 15    Comment: Performed at [facility], [address] [place], [place] [address]  CBC     Status: None   Collection Time: 11/14/19 12:55 PM  Result Value Ref Range   WBC 5.7 4.0 - 10.5 K/uL   RBC 4.78 3.87 - 5.11 MIL/uL   Hemoglobin 14.0 12.0 - 15.0 g/dL   HCT 41.5 36 - 46 %   MCV 86.8 80.0 -  100.0 fL   MCH 29.3 26.0 - 34.0 pg   MCHC 33.7 30.0 - 36.0 g/dL   RDW 14.1 11.5 - 15.5 %   Platelets 262 150 - 400 K/uL   nRBC 0.0 0.0 -  0.2 %    Comment: Performed at [facility], [place], [place] [address]  Acetaminophen level     Status: Abnormal   Collection Time: 11/14/19 12:55 PM  Result Value Ref Range   Acetaminophen (Tylenol), Serum <10 (L) 10 - 30 ug/mL    Comment: (NOTE) Therapeutic concentrations vary significantly. A range of 10-30 ug/mL  may be an effective concentration for many patients. However, some  are best treated at concentrations outside of this range. Acetaminophen concentrations >150 ug/mL at 4 hours after ingestion  and >50 ug/mL at 12 hours after ingestion are often associated with  toxic reactions.  Performed at [facility], [address] [place], [place] [address]   Ethanol     Status: None   Collection Time: 11/14/19 12:55 PM  Result Value Ref Range   Alcohol, Ethyl (B) <10 <10 mg/dL    Comment: (NOTE) Lowest detectable limit for serum alcohol is 10 mg/dL.  For medical purposes only. Performed at [facility], [address], [place],  [address]     Current Facility-Administered Medications  Medication Dose Route Frequency Provider Last Rate Last Admin  . benztropine (COGENTIN) tablet 0.5 mg  0.5 mg Oral QHS Eulas Post, MD      . ciprofloxacin (CILOXAN) 0.3 % ophthalmic solution 2 drop  2 drop Left Eye Q6H Lilia Pro., MD      . clonazePAM Bobbye Charleston) tablet 1 mg  1 mg Oral BID Eulas Post, MD      . fluorescein ophthalmic strip 2 strip  2 strip Left Eye Once Lilia Pro., MD      . haloperidol (HALDOL) tablet 3 mg  3 mg Oral QHS Eulas Post, MD      . tetracaine (PONTOCAINE) 0.5 % ophthalmic solution 1 drop  1 drop Left Eye Once Lilia Pro., MD      . Derrill Memo ON 11/15/2019] venlafaxine XR (EFFEXOR-XR) 24 hr capsule 37.5 mg  37.5 mg Oral Q breakfast Eulas Post, MD       Current Outpatient Medications  Medication Sig Dispense Refill  . albuterol (ACCUNEB) 0.63 MG/3ML nebulizer solution albuterol sulfate 0.63 mg/3 mL solution for nebulization  Inhale 3 mL every 6 hours by inhalation route as needed.    Marland Kitchen albuterol (VENTOLIN HFA) 108 (90 Base) MCG/ACT inhaler SMARTSIG:2 Puff(s) By Mouth Every 4 Hours PRN    . B Complex Vitamins (VITAMIN B-COMPLEX) TABS Take 1 tablet by mouth daily.    Marland Kitchen BREO ELLIPTA 200-25 MCG/INH AEPB Inhale 1 puff into the lungs daily.    . carboxymethylcellulose (REFRESH PLUS) 0.5 % SOLN 1 drop daily as needed.    . Coenzyme Q10 10 MG capsule Take by mouth.    . fluticasone (FLONASE) 50 MCG/ACT nasal spray Place 2 sprays into both nostrils daily.  11  . hydrOXYzine (ATARAX/VISTARIL) 25 MG tablet every 6 (six) hours as needed. TAKE 1 TO 4 TABLETS BY MOUTH EVERY 6 HOURS AS NEEDED FOR ANXIETY    . Magnesium Oxide 400 MG CAPS magnesium 400 mg (as magnesium oxide) capsule  Take 1 capsule every day by oral route.    . neomycin-bacitracin-polymyxin (NEOSPORIN) ophthalmic ointment 1 application 4 (four) times daily.    Marland Kitchen neomycin-polymyxin b-dexamethasone (MAXITROL) 3.5-10000-0.1 OINT Place 1 application into both eyes at bedtime.    Marland Kitchen neomycin-polymyxin b-dexamethasone (MAXITROL) 3.5-10000-0.1 SUSP Place 1 drop into both eyes every 6 (six) hours.     . Oxycodone HCl 10 MG  TABS Take 10 mg by mouth 2 (two) times daily as needed. 1 Tablet(s) By Mouth Twice Daily PRN    . PEPCID 20 MG tablet     . perphenazine (TRILAFON) 2 MG tablet Take 2 mg by mouth 2 (two) times daily.    Vladimir Faster Glycol-Propyl Glycol (SYSTANE) 0.4-0.3 % GEL ophthalmic gel Place 1 application into both eyes. Use between neomycin    . traZODone (DESYREL) 50 MG tablet Take 25-150 mg by mouth at bedtime. TAKE 1/2 TO 3 TABLETS BY MOUTH EVERY DAY AT BEDTIME FOR INSOMNIA    . Vitamins A & D 10000-400 units TABS Take by mouth.      Musculoskeletal: Strength &  Muscle Tone: normal   Gait & Station: normal  Patient leans: n./a   Psychiatric Specialty Exam: Physical Exam---   Per ED   Review of Systems  9 systems reviewed   Blood pressure 123/65, pulse 71, temperature (!) 97.4 F (36.3 C), temperature source Oral, resp. rate 18, height 5' 4"  (1.626 m), weight 74.4 kg, SpO2 100 %.Body mass index is 28.15 kg/m.  General Appearance: haggard anxious unkept Rapport as best as she can  Eye Contact:  Normal   Speech:  Shaky anxious   Volume:  Normal   Mood:  Anxious depressed forlorn  Affect:  Anxious somewhat strange  Thought Process:  Normal in general   Orientation:  Times four   Thought Content:  Hears voices sees visual halluicinations ---paranoid fearful suspicious feels people are following her    Suicidal Thoughts:  Active cannot contract for safety    Homicidal Thoughts:  None   Memory:  Remote recent and immediate intact   Judgement:  Fair   Insight:  Fair   Psychomotor Activity:  Agitated and shaky   Concentration:  Fair to poor   Recall:  Too anxious to cooperate  [employer] of Knowledge:  Fair   Language:  Normal   Akathisia:  None   Handed:  Not known  AIMS (if indicated):     Assets:  Seeks help   ADL's:  Less due to voices and depression  Cognition:  normal  Sleep:   difficulties with all three phases      Treatment Plan Summary:  Caucasian female with relapse of Psychosis with major depression ==she cannot contract for safety and seeks admission for structure support and med mgt in controlled setting   Will ---receive med mgt, CW management group support. milieu, team treatment and discharge planning   Haldol 3 mg po qhs Started Cogentin 0.5 mg po qhs Klonopin 42m po bid Effexor XR 37.5 daily       Disposition: Inpatient admission  REulas Post MD 11/14/2019 5:24 PM

## 2019-11-14 NOTE — ED Triage Notes (Signed)
Pt arrives via POV from home for reports of feeling suicidal x 2 weeks. Pt tearful in triage. Pt states she has been dealing with "black magic and voodoo" and states "this sounds crazy but it is real". PT cooperative.

## 2019-11-14 NOTE — ED Provider Notes (Signed)
[facility] Emergency Department Provider Note  ____________________________________________   First MD Initiated Contact with Patient 11/14/19 1336     (approximate)  I have reviewed the triage vital signs and the nursing notes.  History  Chief Complaint Suicidal    HPI Victoria [name] is a 59 y.o. female past medical history as below, who presents to the emergency department for anxiety, depression, suicidal ideation.  Patient reports feeling more anxious and depressed over the last several weeks.  Several days ago, she planned to end her life with a knife, but ultimately stopped herself.  She denies any HI.  Feels anxious.  Reports compliance with all of her medications.  Aside, she also complains of bilateral eye discomfort for the last week or so, left greater than right.  She states she was seen by ophthalmology and diagnosed with dry eye and started on eyedrops.  She reports pain and foreign body type sensation particularly in the left eye.  Scratching type pain, moderate in severity, no radiation.  Nothing makes it better or worse.  Denies any known trauma.  Does not wear contacts.  Wears glasses.   Past Medical Hx Past Medical History:  Diagnosis Date   Asthma    GERD (gastroesophageal reflux disease)    Hypercholesterolemia    Hypertension    Osteoarthritis of both hips    Psychotic disorder ([place])    Sleep apnea     Problem List Patient Active Problem List   Diagnosis Date Noted   MDD (major depressive disorder), recurrent, severe, with psychosis ([place])    Osteoarthritis of both hips 07/06/2015   Dyslipidemia 07/06/2015   HTN (hypertension) 07/06/2015   GERD (gastroesophageal reflux disease) 07/06/2015   Asthma 07/06/2015   Bipolar I disorder, most recent episode manic, severe with psychotic features ([place]) 07/06/2015    Past Surgical Hx Past Surgical History:  Procedure Laterality Date   ABDOMINAL HYSTERECTOMY      BREAST EXCISIONAL BIOPSY Right 1997   neg   HIATAL HERNIA REPAIR     Jan 2017    Medications Prior to Admission medications   Medication Sig Start Date End Date Taking? Authorizing Provider  albuterol (PROAIR HFA) 108 (90 Base) MCG/ACT inhaler ProAir HFA 90 mcg/actuation aerosol inhaler  INHALE 2 PUFFS EVERY 4 HOURS BY INHALATION ROUTE AS NEEDED FOR 30 DAYS. 03/22/13  Yes [provider]  atorvastatin (LIPITOR) 20 MG tablet Take by mouth. 08/01/17  Yes [provider]  B Complex Vitamins (VITAMIN B-COMPLEX) TABS Take 1 tablet by mouth daily.   Yes [provider]  BREO ELLIPTA 100-25 MCG/INH AEPB 1 puff daily. 11/07/19  Yes [provider]  BREO ELLIPTA 200-25 MCG/INH AEPB Inhale 1 puff into the lungs daily. 11/03/19  Yes [provider]  Coenzyme Q10 10 MG capsule Take by mouth.   Yes [provider]  fluticasone (FLONASE) 50 MCG/ACT nasal spray Place 2 sprays into both nostrils daily. 04/02/15  Yes [provider]  hydrOXYzine (ATARAX/VISTARIL) 25 MG tablet Take 25 mg by mouth 3 (three) times daily as needed.   Yes [provider]  lurasidone (LATUDA) 40 MG TABS tablet  07/25/17  Yes [provider]  montelukast (SINGULAIR) 10 MG tablet TAKE 1 TABLET BY MOUTH EVERYDAY AT BEDTIME 09/20/17  Yes [provider]  neomycin-polymyxin b-dexamethasone (MAXITROL) 3.5-10000-0.1 SUSP  11/07/19  Yes [provider]  omeprazole (PRILOSEC) 20 MG capsule Take 20 mg by mouth daily. 10/01/19  Yes [provider]  Oxycodone HCl 10 MG TABS Take 10 mg by mouth 2 (two) times daily as needed. 1 Tablet(s) By Mouth Twice Daily PRN   Yes [provider]  topiramate (TOPAMAX) 200 MG tablet TAKE 1 TABLET BY MOUTH EVERY DAY AT NIGHT 07/06/18  Yes [provider]  traZODone (DESYREL) 50 MG tablet Take 25-150 mg by mouth at bedtime. TAKE 1/2 TO 3 TABLETS BY MOUTH EVERY DAY AT BEDTIME FOR INSOMNIA   Yes  [provider]  vortioxetine HBr (TRINTELLIX) 20 MG TABS tablet  11/17/17  Yes [provider]  albuterol (ACCUNEB) 0.63 MG/3ML nebulizer solution albuterol sulfate 0.63 mg/3 mL solution for nebulization  Inhale 3 mL every 6 hours by inhalation route as needed.    [provider]  albuterol (VENTOLIN HFA) 108 (90 Base) MCG/ACT inhaler SMARTSIG:2 Puff(s) By Mouth Every 4 Hours PRN Patient not taking: SMARTSIG:2 Puff(s) By Mouth Every 4 Hours PRN 05/26/19   [provider]  celecoxib (CELEBREX) 200 MG capsule Take 200 mg by mouth 2 (two) times daily. Patient not taking: Reported on 11/14/2019 05/21/15   [provider]  clonazePAM (KLONOPIN) 0.5 MG tablet Take 0.5-1 tablets by mouth 3 (three) times daily as needed. Patient not taking: Reported on 11/14/2019 05/31/15   [provider]  DULoxetine (CYMBALTA) 20 MG capsule Cymbalta 20 mg capsule,delayed release  Take 1 capsule twice a day by oral route as directed. Patient not taking: No sig reported    [provider]  escitalopram (LEXAPRO) 20 MG tablet Take 20 mg by mouth 2 (two) times daily. Patient not taking: Reported on 11/14/2019 05/07/15   [provider]  Magnesium Oxide 400 MG CAPS magnesium 400 mg (as magnesium oxide) capsule  Take 1 capsule every day by oral route.    [provider]  metoprolol tartrate (LOPRESSOR) 25 MG tablet Take 12.5 mg by mouth 2 (two) times daily. Patient not taking: Reported on 11/14/2019 04/06/15   [provider]  OLANZapine zydis (ZYPREXA) 10 MG disintegrating tablet Take 1 tablet (10 mg total) by mouth 2 (two) times daily. Patient not taking: Reported on 11/14/2019 07/10/15   Clovis Fredrickson, MD  PEPCID 20 MG tablet  11/07/19   [provider]  perphenazine (TRILAFON) 2 MG tablet Take 2 mg by mouth 2 (two) times daily. 11/07/19   [provider]  QUEtiapine (SEROQUEL) 100 MG tablet Seroquel 100 mg tablet   Take 2 tablets every day by oral route at bedtime.    [provider]  Vitamins A & D 10000-400 units TABS Take by mouth.    [provider]  ZETIA 10 MG tablet Take 10 mg by mouth daily. 04/06/15   [provider]    Allergies Amoxicillin, Aspirin, Codeine, Penicillins, and Sulfa antibiotics  Family Hx Family History  Problem Relation Age of Onset   Heart disease Mother     Social Hx Social History   Tobacco Use   Smoking status: Former Smoker   Smokeless tobacco: Never Used  Substance Use Topics   Alcohol use: No    Alcohol/week: 0.0 standard drinks   Drug use: No    Comment: No use of illict drugs.     Review of Systems  Constitutional: Negative for fever, chills. Eyes: Negative for visual changes.  ENT: Negative for sore throat. Cardiovascular: Negative for chest pain. Respiratory: Negative for shortness of breath. Gastrointestinal: Negative for nausea, vomiting.  Genitourinary: Negative for dysuria. Musculoskeletal: Negative for leg swelling. Skin: Negative  for rash. Neurological: Negative for for headaches.   Physical Exam  Vital Signs: ED Triage Vitals  Enc Vitals Group     BP 11/14/19 1252 122/61     Pulse Rate 11/14/19 1252 70     Resp 11/14/19 1252 20     Temp 11/14/19 1252 98.5 F (36.9 C)     Temp Source 11/14/19 1252 Oral     SpO2 11/14/19 1252 99 %     Weight 11/14/19 1253 164 lb (74.4 kg)     Height 11/14/19 1253 5' 4"  (1.626 m)     Head Circumference --      Peak Flow --      Pain Score 11/14/19 1252 8     Pain Loc --      Pain Edu? --      Excl. in [place]? --     Constitutional: Alert and oriented.  Head: Normocephalic. Atraumatic. Eyes: Conjunctivae injected on left, tearing.  Conjunctiva clear on the right.  Sclera anicteric.  Full EOMI.  PERRLA.  No proptosis or sunken eye.  No pain with eye movement.  Left eye evaluated with fluorescein and Wood's lamp, there is a focal area of increased uptake at the 9  o'clock position, consistent with corneal abrasion.  No dendrites.  Eyelids everted without evidence of foreign body. Nose: No congestion. No rhinorrhea. Mouth/Throat: Mucous membranes are moist.  Neck: No stridor.   Cardiovascular: Normal rate. Extremities well perfused. Respiratory: Normal respiratory effort.   Gastrointestinal: Non-distended.  Musculoskeletal: No deformities. Neurologic:  Normal speech and language. No gross focal neurologic deficits are appreciated.  Skin: Skin is warm, dry and intact.  Psychiatric: Tearful and somewhat anxious.  Reports increased depression and suicidal thoughts.  EKG  N/A    Radiology  N/A   Procedures  Procedure(s) performed (including critical care):  Procedures   Initial Impression / Assessment and Plan / ED Course  59 y.o. female who presents to the ED for increased anxiety, depression, suicidal ideation. Will obtain basic screening labs and consult psychiatry and TTS.   Also complains of left eye pain, with exam as above consistent with corneal abrasion.  Will initiate ciprofloxacin eyedrops for this (patient does not wear contacts).  The patient has been placed in psychiatric observation due to the need to provide a safe environment for the patient while obtaining psychiatric consultation and evaluation, as well as ongoing medical and medication management to treat the patient's condition.  The patient has not been placed under full IVC at this time.  She presents voluntarily seeking care.  She is comfortable with the above plan.   Final Clinical Impression(s) / ED Diagnosis  Final diagnoses:  Abrasion of left cornea, initial encounter  Depression, unspecified depression type  Suicidal ideation       Note:  This document was prepared using Dragon voice recognition software and may include unintentional dictation errors.   Lilia Pro., MD 11/14/19 [phone]

## 2019-11-14 NOTE — ED Notes (Signed)
Pt denies SI/HI/AVH on assessment. Pt endorses anxiety and pain to back d/t osteoarthritis. Pt c/o pain 7/10, offered pain medication but she refused. Pt states "I don't need anything for pain now but I need something because I'm anxious". Noted to be restless on the bed, trying to pace but redirected to bed.

## 2019-11-14 NOTE — ED Notes (Signed)
Pt sent other belongings that were in a travel bag and her purse home with her husband. Husband asked this RN to write down which eye drops the pt takes so that these can be given to her while she is here.  Systane drops Neomycin ointment Neomycin drops Soothe drops

## 2019-11-14 NOTE — ED Notes (Addendum)
1 black jacket 1 pair of grey socks  1 pair of green slip on shoes  1 green tank top  1 pair of checkered green shorts 1 pair of green underwear 1 white bra 1 red head band 1 brown hair clip 1 blue tye dye mask  1 kate spade glasses case 1 hair brush 1 toothbrush 1 cloth travel case  Pt states she needs her glasses to see so allowed to wear glasses.  Pt dressed out in hospital scrubs

## 2019-11-15 ENCOUNTER — Other Ambulatory Visit: Payer: Self-pay

## 2019-11-15 DIAGNOSIS — R45851 Suicidal ideations: Secondary | ICD-10-CM | POA: Insufficient documentation

## 2019-11-15 DIAGNOSIS — F29 Unspecified psychosis not due to a substance or known physiological condition: Principal | ICD-10-CM

## 2019-11-15 DIAGNOSIS — G894 Chronic pain syndrome: Secondary | ICD-10-CM

## 2019-11-15 DIAGNOSIS — S0500XA Injury of conjunctiva and corneal abrasion without foreign body, unspecified eye, initial encounter: Secondary | ICD-10-CM | POA: Insufficient documentation

## 2019-11-15 MED ORDER — OXYCODONE HCL 5 MG PO TABS
5.0000 mg | ORAL_TABLET | Freq: Four times a day (QID) | ORAL | Status: DC | PRN
  Administered 2019-11-15 – 2019-11-18 (×5): 5 mg via ORAL
  Filled 2019-11-15 (×6): qty 1

## 2019-11-15 MED ORDER — CLONAZEPAM 1 MG PO TABS
0.5000 mg | ORAL_TABLET | Freq: Two times a day (BID) | ORAL | Status: DC | PRN
  Administered 2019-11-15 – 2019-11-16 (×2): 0.5 mg via ORAL
  Filled 2019-11-15 (×3): qty 1

## 2019-11-15 NOTE — [facility] Group Notes (Signed)
Balance In Life 11/15/2019 9:30AM/1PM  Type of Therapy/Topic:  Group Therapy:  Balance in Life  Participation Level:  Active  Description of Group:   This group will address the concept of balance and how it feels and looks when one is unbalanced. Patients will be encouraged to process areas in their lives that are out of balance and identify reasons for remaining unbalanced. Facilitators will guide patients in utilizing problem-solving interventions to address and correct the stressor making their life unbalanced. Understanding and applying boundaries will be explored and addressed for obtaining and maintaining a balanced life. Patients will be encouraged to explore ways to assertively make their unbalanced needs known to significant others in their lives, using other group members and facilitator for support and feedback.  Therapeutic Goals: 1. Patient will identify two or more emotions or situations they have that consume much of in their lives. 2. Patient will identify signs/triggers that life has become out of balance:  3. Patient will identify two ways to set boundaries in order to achieve balance in their lives:  4. Patient will demonstrate ability to communicate their needs through discussion and/or role plays  Summary of Patient Progress: Pt actively and appropriately participated in group session. Pt interacted appropriately with group members and respected boundaries during session. Pt discussed with group her definition of balance and identified healthy ways to maintain a balanced lifestyle.   Therapeutic Modalities:   Cognitive Behavioral Therapy Solution-Focused Therapy Assertiveness Training  Chey Rachels Lynelle Smoke, LCSW

## 2019-11-15 NOTE — Tx Team (Addendum)
Interdisciplinary Treatment and Diagnostic Plan Update  11/15/2019 Time of Session: 9am Victoria [name] MRN: [id number]  Principal Diagnosis: <principal problem not specified>  Secondary Diagnoses: Active Problems:   MDD (major depressive disorder), single episode, severe with psychosis ([place])   Current Medications:  Current Facility-Administered Medications  Medication Dose Route Frequency Provider Last Rate Last Admin  . acetaminophen (TYLENOL) tablet 650 mg  650 mg Oral Q6H PRN Caroline Sauger, NP      . alum & mag hydroxide-simeth (MAALOX/MYLANTA) 200-200-20 MG/5ML suspension 30 mL  30 mL Oral Q4H PRN Caroline Sauger, NP      . benztropine (COGENTIN) tablet 0.5 mg  0.5 mg Oral QHS Caroline Sauger, NP      . ciprofloxacin (CILOXAN) 0.3 % ophthalmic solution 2 drop  2 drop Left Eye Q6H Caroline Sauger, NP   2 drop at 11/15/19 0729  . clonazePAM (KLONOPIN) tablet 0.5 mg  0.5 mg Oral BID PRN Clapacs, Madie Reno, MD      . fluorescein ophthalmic strip 2 strip  2 strip Left Eye Once Caroline Sauger, NP      . haloperidol (HALDOL) tablet 3 mg  3 mg Oral QHS Caroline Sauger, NP      . magnesium hydroxide (MILK OF MAGNESIA) suspension 30 mL  30 mL Oral Daily PRN Caroline Sauger, NP      . oxyCODONE (Oxy IR/ROXICODONE) immediate release tablet 5 mg  5 mg Oral Q6H PRN Clapacs, Madie Reno, MD   5 mg at 11/15/19 1124  . polyvinyl alcohol (LIQUIFILM TEARS) 1.4 % ophthalmic solution 1 drop  1 drop Both Eyes PRN Caroline Sauger, NP   1 drop at 11/15/19 1122  . tetracaine (PONTOCAINE) 0.5 % ophthalmic solution 1 drop  1 drop Left Eye Once Caroline Sauger, NP      . traZODone (DESYREL) tablet 100 mg  100 mg Oral QHS Caroline Sauger, NP      . venlafaxine XR (EFFEXOR-XR) 24 hr capsule 37.5 mg  37.5 mg Oral Q breakfast Caroline Sauger, NP   37.5 mg at 11/15/19 0258   PTA Medications: Medications Prior to Admission  Medication Sig Dispense Refill Last Dose   . albuterol (ACCUNEB) 0.63 MG/3ML nebulizer solution albuterol sulfate 0.63 mg/3 mL solution for nebulization  Inhale 3 mL every 6 hours by inhalation route as needed.     Marland Kitchen albuterol (VENTOLIN HFA) 108 (90 Base) MCG/ACT inhaler SMARTSIG:2 Puff(s) By Mouth Every 4 Hours PRN     . B Complex Vitamins (VITAMIN B-COMPLEX) TABS Take 1 tablet by mouth daily.     Marland Kitchen BREO ELLIPTA 200-25 MCG/INH AEPB Inhale 1 puff into the lungs daily.     . carboxymethylcellulose (REFRESH PLUS) 0.5 % SOLN 1 drop daily as needed.     . Coenzyme Q10 10 MG capsule Take by mouth.     . fluticasone (FLONASE) 50 MCG/ACT nasal spray Place 2 sprays into both nostrils daily.  11   . hydrOXYzine (ATARAX/VISTARIL) 25 MG tablet every 6 (six) hours as needed. TAKE 1 TO 4 TABLETS BY MOUTH EVERY 6 HOURS AS NEEDED FOR ANXIETY     . Magnesium Oxide 400 MG CAPS magnesium 400 mg (as magnesium oxide) capsule  Take 1 capsule every day by oral route.     . neomycin-bacitracin-polymyxin (NEOSPORIN) ophthalmic ointment 1 application 4 (four) times daily.     Marland Kitchen neomycin-polymyxin b-dexamethasone (MAXITROL) 3.5-10000-0.1 OINT Place 1 application into both eyes at bedtime.     Marland Kitchen neomycin-polymyxin b-dexamethasone (MAXITROL) 3.5-10000-0.1 SUSP Place  1 drop into both eyes every 6 (six) hours.      . Oxycodone HCl 10 MG TABS Take 10 mg by mouth 2 (two) times daily as needed. 1 Tablet(s) By Mouth Twice Daily PRN     . PEPCID 20 MG tablet      . perphenazine (TRILAFON) 2 MG tablet Take 2 mg by mouth 2 (two) times daily.     Vladimir Faster Glycol-Propyl Glycol (SYSTANE) 0.4-0.3 % GEL ophthalmic gel Place 1 application into both eyes. Use between neomycin     . traZODone (DESYREL) 50 MG tablet Take 25-150 mg by mouth at bedtime. TAKE 1/2 TO 3 TABLETS BY MOUTH EVERY DAY AT BEDTIME FOR INSOMNIA     . Vitamins A & D 10000-400 units TABS Take by mouth.       Patient Stressors:    Patient Strengths:    Treatment Modalities: Medication Management, Group  therapy, Case management,  1 to 1 session with clinician, Psychoeducation, Recreational therapy.   Physician Treatment Plan for Primary Diagnosis: <principal problem not specified> Long Term Goal(s):     Short Term Goals:    Medication Management: Evaluate patient's response, side effects, and tolerance of medication regimen.  Therapeutic Interventions: 1 to 1 sessions, Unit Group sessions and Medication administration.  Evaluation of Outcomes: Not Met  Physician Treatment Plan for Secondary Diagnosis: Active Problems:   MDD (major depressive disorder), single episode, severe with psychosis ([place])  Long Term Goal(s):     Short Term Goals:       Medication Management: Evaluate patient's response, side effects, and tolerance of medication regimen.  Therapeutic Interventions: 1 to 1 sessions, Unit Group sessions and Medication administration.  Evaluation of Outcomes: Not Met   RN Treatment Plan for Primary Diagnosis: <principal problem not specified> Long Term Goal(s): Knowledge of disease and therapeutic regimen to maintain health will improve  Short Term Goals: Ability to participate in decision making will improve, Ability to verbalize feelings will improve, Ability to disclose and discuss suicidal ideas, Ability to identify and develop effective coping behaviors will improve and Compliance with prescribed medications will improve  Medication Management: RN will administer medications as ordered by provider, will assess and evaluate patient's response and provide education to patient for prescribed medication. RN will report any adverse and/or side effects to prescribing provider.  Therapeutic Interventions: 1 on 1 counseling sessions, Psychoeducation, Medication administration, Evaluate responses to treatment, Monitor vital signs and CBGs as ordered, Perform/monitor CIWA, COWS, AIMS and Fall Risk screenings as ordered, Perform wound care treatments as ordered.  Evaluation of  Outcomes: Not Met   LCSW Treatment Plan for Primary Diagnosis: <principal problem not specified> Long Term Goal(s): Safe transition to appropriate next level of care at discharge, Engage patient in therapeutic group addressing interpersonal concerns.  Short Term Goals: Engage patient in aftercare planning with referrals and resources  Therapeutic Interventions: Assess for all discharge needs, 1 to 1 time with Social worker, Explore available resources and support systems, Assess for adequacy in community support network, Educate family and significant other(s) on suicide prevention, Complete Psychosocial Assessment, Interpersonal group therapy.  Evaluation of Outcomes: Not Met   Progress in Treatment: Attending groups: No. Participating in groups: No. Taking medication as prescribed: Yes. Toleration medication: Yes. Family/Significant other contact made: No, will contact:  when pt gives consent Patient understands diagnosis: Yes. Discussing patient identified problems/goals with staff: Yes. Medical problems stabilized or resolved: No. Denies suicidal/homicidal ideation: Yes. Issues/concerns per patient self-inventory: No. Other: NA  New  problem(s) identified: No, Describe:  None reported  New Short Term/Long Term Goal(s):Attend outpatient treatment, take medication as prescribed, develop and implement healthy coping methods  Patient Goals: "Find coping skills to deal with stress and crises"   Discharge Plan or Barriers: Pt will return home and follow up with outpatient treatment  Reason for Continuation of Hospitalization: Medication stabilization  Estimated Length of Stay:1-7 days   Recreational Therapy: Patient: N/A Patient Goal: Patient will engage in groups without prompting or encouragement from LRT x3 group sessions within 5 recreation therapy group sessions.  Attendees: Patient:Victoria [name] 11/15/2019 12:19 PM  Physician: Alethia Berthold 11/15/2019 12:19 PM   Nursing: Javier Glazier, nurse 11/15/2019 12:19 PM  RN Care Manager: 11/15/2019 12:19 PM  Social Worker: Anise Salvo 11/15/2019 12:19 PM  Recreational Therapist: Roanna Epley 11/15/2019 12:19 PM  Other:  11/15/2019 12:19 PM  Other:  11/15/2019 12:19 PM  Other: 11/15/2019 12:19 PM    Scribe for Treatment Team: Yvette Rack, LCSW 11/15/2019 12:19 PM

## 2019-11-15 NOTE — Progress Notes (Signed)
Recreation Therapy Notes  Date: 11/15/2019  Time: 9:30 am   Location: Room 21   Behavioral response: N/A   Intervention Topic: Animal Assisted therapy    Discussion/Intervention: Patient did not attend group.   Clinical Observations/Feedback:  Patient did not attend group.   Carinne Brandenburger LRT/CTRS        Selma Mink 11/15/2019 10:53 AM

## 2019-11-15 NOTE — Plan of Care (Signed)
D: Pt. During assessments this morning is observed in her room resting in bed. Pt. During our conversation visibly presented tired. Pt. Engagement was overall superficial, but nonetheless answered this writers questions. Pt. Denied si/hi/avh, and verbalized ability to continue to remain safe on the unit. Pt. Denied physical pain and verbalized tolerating her medicines thus far, besides stating, "I think they might be making me too sleepy". Pt. Endorsed a normal mood. Pt. When asked about previous statements about magic states, "yes black magic, I know it sounds weird".    A: Q x 15 minute observation checks in place/maintained for safety. Patient is provided with education throughout shift when appropriate and able.  Patient is given/offered medications per orders. Patient is encouraged to attend groups, participate in unit activities and continue with plan of care. Pt. Chart and plans of care reviewed. Pt. Given support and encouragement when appropriate and able.    R: Patient is complaint with medication and unit procedures thus far. Pt. Observed eating good. Pt. Besides breakfast has been observed resting in bed in her room. Pt. Thus far has not been a behavioral concern.      Problem: Education: Goal: Knowledge of [place] [facility] Education information/materials will improve Outcome: Progressing Goal: Emotional status will improve Outcome: Progressing Goal: Mental status will improve Outcome: Progressing Goal: Verbalization of understanding the information provided will improve Outcome: Progressing   Problem: Activity: Goal: Interest or engagement in activities will improve Outcome: Progressing Goal: Sleeping patterns will improve Outcome: Progressing   Problem: Coping: Goal: Ability to verbalize frustrations and anger appropriately will improve Outcome: Progressing Goal: Ability to demonstrate self-control will improve Outcome: Progressing   Problem: Health Behavior/Discharge  Planning: Goal: Identification of resources available to assist in meeting health care needs will improve Outcome: Progressing Goal: Compliance with treatment plan for underlying cause of condition will improve Outcome: Progressing   Problem: Physical Regulation: Goal: Ability to maintain clinical measurements within normal limits will improve Outcome: Progressing   Problem: Safety: Goal: Periods of time without injury will increase Outcome: Progressing   Problem: Activity: Goal: Will verbalize the importance of balancing activity with adequate rest periods Outcome: Progressing   Problem: Education: Goal: Will be free of psychotic symptoms Outcome: Progressing Goal: Knowledge of the prescribed therapeutic regimen will improve Outcome: Progressing   Problem: Coping: Goal: Coping ability will improve Outcome: Progressing Goal: Will verbalize feelings Outcome: Progressing   Problem: Health Behavior/Discharge Planning: Goal: Compliance with prescribed medication regimen will improve Outcome: Progressing   Problem: Nutritional: Goal: Ability to achieve adequate nutritional intake will improve Outcome: Progressing   Problem: Role Relationship: Goal: Ability to communicate needs accurately will improve Outcome: Progressing Goal: Ability to interact with others will improve Outcome: Progressing   Problem: Safety: Goal: Ability to redirect hostility and anger into socially appropriate behaviors will improve Outcome: Progressing Goal: Ability to remain free from injury will improve Outcome: Progressing   Problem: Self-Care: Goal: Ability to participate in self-care as condition permits will improve Outcome: Progressing   Problem: Self-Concept: Goal: Will verbalize positive feelings about self Outcome: Progressing
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
## 2019-11-15 NOTE — Progress Notes (Signed)
BRIEF PHARMACY NOTE   This patient attended and participated in Medication Management Group counseling led by [facility] staff pharmacist.  This interactive class reviews basic information about prescription medications and education on personal responsibility in medication management.  The class also includes general knowledge of 3 main classes of behavioral medications, including antipsychotics, antidepressants, and mood stabilizers.     Patient behavior was appropriate for group setting.   Educational materials sourced from:  "Medication Do's and Don'ts" from [email or web address]   "Mental Health Medications" from [email or web address] [place], PharmD, BCPS Clinical Pharmacist 11/15/2019 2:51 PM

## 2019-11-15 NOTE — H&P (Signed)
Psychiatric Admission Assessment Adult  Patient Identification: Victoria [name] MRN:  [id number] Date of Evaluation:  11/15/2019 Chief Complaint:  MDD (major depressive disorder), single episode, severe with psychosis ([place]) [F32.3] Principal Diagnosis: Psychosis ([place]) Diagnosis:  Principal Problem:   Psychosis ([place]) Active Problems:   HTN (hypertension)   GERD (gastroesophageal reflux disease)   Asthma   Chronic pain syndrome  History of Present Illness: Patient seen chart reviewed.  Also spoke to the patient's husband with her consent.  Patient also attended treatment team.  59 year old woman with a longstanding problem with delusions.  Patient says things have been worse for about the last 6 weeks.  She talks about how she is under "demonic attack".  She means this literally.  Feels like demons are attacking her.  Hears voices.  Feels paranoid and negative all the time.  Recently these of been disturbing enough that she had thoughts of killing herself although without any plan.  She stopped taking her psychiatric medicine a couple months ago.  Has not been following up very well with outpatient treatment although evidently she sees a therapist at times.  Denies alcohol or drug abuse.  She does have chronic pain problems for which she takes oxycodone daily.  Patient and her husband [place].  Patient says she has been unable to do her usual daily activities for quite a while because of the severity of the symptoms. Associated Signs/Symptoms: Depression Symptoms:  depressed mood, psychomotor agitation, difficulty concentrating, hopelessness, suicidal thoughts without plan, disturbed sleep, (Hypo) Manic Symptoms:  Impulsivity, Anxiety Symptoms:  Excessive Worry, Psychotic Symptoms:  Hallucinations: Auditory Paranoia, PTSD Symptoms: Negative Total Time spent with patient: 1 hour  Past Psychiatric History: Patient has had 2 previous hospitalizations the most recent  one in 2017 here at our hospital.  Diagnosis remains unclear.  Diagnosis has variously been major depression with psychotic features versus bipolar disorder.  The psychotic symptoms seem however to a been persistent even with medication trials although the patient admits she has never really tolerated medicines or stayed on adequate doses.  She does see a psychiatrist in [place].  She had been on methylphenidate 60 mg a day until a couple months ago for unclear reasons.  Does not seem to have refilled that this month.  Denies any past suicide attempts or violence.  Past medications have included olanzapine which was prescribed here 7 years ago, Seroquel which she says was her most recent medication and antidepressants.  She does not feel like anything has ever been of any real benefit  Is the patient at risk to self? Yes.    Has the patient been a risk to self in the past 6 months? Yes.    Has the patient been a risk to self within the distant past? No.  Is the patient a risk to others? No.  Has the patient been a risk to others in the past 6 months? No.  Has the patient been a risk to others within the distant past? No.   Prior Inpatient Therapy:   Prior Outpatient Therapy:    Alcohol Screening: 1. How often do you have a drink containing alcohol?: Never 2. How many drinks containing alcohol do you have on a typical day when you are drinking?: 1 or 2 3. How often do you have six or more drinks on one occasion?: Never AUDIT-C Score: 0 4. How often during the last year have you found that you were not able to stop drinking once  you had started?: Never 5. How often during the last year have you failed to do what was normally expected from you because of drinking?: Never 6. How often during the last year have you needed a first drink in the morning to get yourself going after a heavy drinking session?: Never 7. How often during the last year have you had a feeling of guilt of remorse after drinking?:  Never 8. How often during the last year have you been unable to remember what happened the night before because you had been drinking?: Never 9. Have you or someone else been injured as a result of your drinking?: No 10. Has a relative or friend or a doctor or another health worker been concerned about your drinking or suggested you cut down?: No Alcohol Use Disorder Identification Test Final Score (AUDIT): 0 Alcohol Brief Interventions/Follow-up: AUDIT Score <7 follow-up not indicated Substance Abuse History in the last 12 months:  No. Consequences of Substance Abuse: Negative Previous Psychotropic Medications: Yes  Psychological Evaluations: Yes  Past Medical History:  Past Medical History:  Diagnosis Date  . Asthma   . GERD (gastroesophageal reflux disease)   . Hypercholesterolemia   . Hypertension   . Osteoarthritis of both hips   . Psychotic disorder ([place])   . Sleep apnea     Past Surgical History:  Procedure Laterality Date  . ABDOMINAL HYSTERECTOMY    . BREAST EXCISIONAL BIOPSY Right 1997   neg  . HIATAL HERNIA REPAIR     Jan 2017   Family History:  Family History  Problem Relation Age of Onset  . Heart disease Mother    Family Psychiatric  History: The patient says she has 2 sisters who have also both had psychotic problems Tobacco Screening: Have you used any form of tobacco in the last 30 days? (Cigarettes, Smokeless Tobacco, Cigars, and/or Pipes): No Social History:  Social History   Substance and Sexual Activity  Alcohol Use No  . Alcohol/week: 0.0 standard drinks     Social History   Substance and Sexual Activity  Drug Use No   Comment: No use of illict drugs.    Additional Social History:                           Allergies:   Allergies  Allergen Reactions  . Amoxicillin   . Aspirin   . Codeine   . Penicillins   . Sulfa Antibiotics    Lab Results:  Results for orders placed or performed during the hospital encounter of 11/14/19  (from the past 48 hour(s))  Comprehensive metabolic panel     Status: Abnormal   Collection Time: 11/14/19 12:55 PM  Result Value Ref Range   Sodium 140 135 - 145 mmol/L   Potassium 4.2 3.5 - 5.1 mmol/L   Chloride 105 98 - 111 mmol/L   CO2 27 22 - 32 mmol/L   Glucose, Bld 128 (H) 70 - 99 mg/dL    Comment: Glucose reference range applies only to samples taken after fasting for at least 8 hours.   BUN 16 6 - 20 mg/dL   Creatinine, Ser 1.17 (H) 0.44 - 1.00 mg/dL   Calcium 9.3 8.9 - 10.3 mg/dL   Total Protein 7.3 6.5 - 8.1 g/dL   Albumin 4.2 3.5 - 5.0 g/dL   AST 21 15 - 41 U/L   ALT 19 0 - 44 U/L   Alkaline Phosphatase 67 38 - 126 U/L  Total Bilirubin 0.9 0.3 - 1.2 mg/dL   GFR calc non Af Amer 51 (L) >60 mL/min   GFR calc Af Amer 59 (L) >60 mL/min   Anion gap 8 5 - 15    Comment: Performed at [facility], [address] [place], [place] [address]  CBC     Status: None   Collection Time: 11/14/19 12:55 PM  Result Value Ref Range   WBC 5.7 4.0 - 10.5 K/uL   RBC 4.78 3.87 - 5.11 MIL/uL   Hemoglobin 14.0 12.0 - 15.0 g/dL   HCT 41.5 36 - 46 %   MCV 86.8 80.0 - 100.0 fL   MCH 29.3 26.0 - 34.0 pg   MCHC 33.7 30.0 - 36.0 g/dL   RDW 14.1 11.5 - 15.5 %   Platelets 262 150 - 400 K/uL   nRBC 0.0 0.0 - 0.2 %    Comment: Performed at [facility], [address], [place], [place] [address]  Acetaminophen level     Status: Abnormal   Collection Time: 11/14/19 12:55 PM  Result Value Ref Range   Acetaminophen (Tylenol), Serum <10 (L) 10 - 30 ug/mL    Comment: (NOTE) Therapeutic concentrations vary significantly. A range of 10-30 ug/mL  may be an effective concentration for many patients. However, some  are best treated at concentrations outside of this range. Acetaminophen concentrations >150 ug/mL at 4 hours after ingestion  and >50 ug/mL at 12 hours after ingestion are often associated with  toxic reactions.  Performed at [facility], [address] [place], [place] [address]   Ethanol     Status: None   Collection Time: 11/14/19 12:55 PM  Result Value Ref Range   Alcohol, Ethyl (B) <10 <10 mg/dL    Comment: (NOTE) Lowest detectable limit for serum alcohol is 10 mg/dL.  For medical purposes only. Performed at [facility], [address] [place], [place] [address]   Urine Drug Screen, Qualitative     Status: Abnormal   Collection Time: 11/14/19 12:55 PM  Result Value Ref Range   Tricyclic, Ur Screen POSITIVE (A) NONE DETECTED   Amphetamines, Ur Screen NONE DETECTED NONE DETECTED   MDMA (Ecstasy)Ur Screen NONE DETECTED NONE DETECTED   Cocaine Metabolite,Ur [place] NONE DETECTED NONE DETECTED   Opiate, Ur Screen NONE DETECTED NONE DETECTED   Phencyclidine (PCP) Ur S NONE DETECTED NONE DETECTED   Cannabinoid 50 Ng, Ur [place] NONE DETECTED NONE DETECTED   Barbiturates, Ur Screen NONE DETECTED NONE DETECTED   Benzodiazepine, Ur Scrn NONE DETECTED NONE DETECTED   Methadone Scn, Ur NONE DETECTED NONE DETECTED    Comment: (NOTE) Tricyclics + metabolites, urine    Cutoff 1000 ng/mL Amphetamines + metabolites, urine  Cutoff 1000 ng/mL MDMA (Ecstasy), urine              Cutoff 500 ng/mL Cocaine Metabolite, urine          Cutoff 300 ng/mL Opiate + metabolites, urine        Cutoff 300 ng/mL Phencyclidine (PCP), urine         Cutoff 25 ng/mL Cannabinoid, urine                 Cutoff 50 ng/mL Barbiturates + metabolites, urine  Cutoff 200 ng/mL Benzodiazepine, urine              Cutoff 200 ng/mL Methadone, urine  Cutoff 300 ng/mL  The urine drug screen provides only a preliminary, unconfirmed analytical test result and should not be used for non-medical purposes. Clinical consideration and professional judgment should be applied to any positive drug screen result due to possible interfering substances. A more specific alternate chemical method must be used in order to obtain a confirmed analytical result. Gas  chromatography / mass spectrometry (GC/MS) is the preferred confirm atory method. Performed at [facility], [address] [place], [place] [address]   SARS Coronavirus 2 by RT PCR (hospital order, performed in [facility] hospital lab) Nasopharyngeal Nasopharyngeal Swab     Status: None   Collection Time: 11/14/19  8:47 PM   Specimen: Nasopharyngeal Swab  Result Value Ref Range   SARS Coronavirus 2 NEGATIVE NEGATIVE    Comment: (NOTE) SARS-CoV-2 target nucleic acids are NOT DETECTED.  The SARS-CoV-2 RNA is generally detectable in upper and lower respiratory specimens during the acute phase of infection. The lowest concentration of SARS-CoV-2 viral copies this assay can detect is 250 copies / mL. A negative result does not preclude SARS-CoV-2 infection and should not be used as the sole basis for treatment or other patient management decisions.  A negative result may occur with improper specimen collection / handling, submission of specimen other than nasopharyngeal swab, presence of viral mutation(s) within the areas targeted by this assay, and inadequate number of viral copies (<250 copies / mL). A negative result must be combined with clinical observations, patient history, and epidemiological information.  Fact Sheet for Patients:   StrictlyIdeas.no  Fact Sheet for Healthcare Providers: BankingDealers.co.za  This test is not yet approved or  cleared by the [place] FDA and has been authorized for detection and/or diagnosis of SARS-CoV-2 by FDA under an Emergency Use Authorization (EUA).  This EUA will remain in effect (meaning this test can be used) for the duration of the COVID-19 declaration under Section 564(b)(1) of the Act, 21 U.S.C. section 360bbb-3(b)(1), unless the authorization is terminated or revoked sooner.  Performed at [facility], [address] [place], [place] [address]      Blood Alcohol level:  Lab Results  Component Value Date   [facility] <10 11/14/2019   ETH <5 48/54/6270    Metabolic Disorder Labs:  Lab Results  Component Value Date   HGBA1C 4.8 07/06/2015   Lab Results  Component Value Date   PROLACTIN 9.7 07/06/2015   Lab Results  Component Value Date   CHOL 205 (H) 07/06/2015   TRIG 103 07/06/2015   HDL 50 07/06/2015   CHOLHDL 4.1 07/06/2015   VLDL 21 07/06/2015   LDLCALC 134 (H) 07/06/2015    Current Medications: Current Facility-Administered Medications  Medication Dose Route Frequency Provider Last Rate Last Admin  . acetaminophen (TYLENOL) tablet 650 mg  650 mg Oral Q6H PRN Caroline Sauger, NP      . alum & mag hydroxide-simeth (MAALOX/MYLANTA) 200-200-20 MG/5ML suspension 30 mL  30 mL Oral Q4H PRN Caroline Sauger, NP      . benztropine (COGENTIN) tablet 0.5 mg  0.5 mg Oral QHS Caroline Sauger, NP      . ciprofloxacin (CILOXAN) 0.3 % ophthalmic solution 2 drop  2 drop Left Eye Q6H Caroline Sauger, NP   2 drop at 11/15/19 1448  . clonazePAM (KLONOPIN) tablet 0.5 mg  0.5 mg Oral BID PRN Edwin Cherian, Madie [place], MD      . fluorescein ophthalmic strip 2 strip  2 strip Left Eye Once Caroline Sauger, NP      .  haloperidol (HALDOL) tablet 3 mg  3 mg Oral QHS Caroline Sauger, NP      . magnesium hydroxide (MILK OF MAGNESIA) suspension 30 mL  30 mL Oral Daily PRN Caroline Sauger, NP      . oxyCODONE (Oxy IR/ROXICODONE) immediate release tablet 5 mg  5 mg Oral Q6H PRN Deolinda Frid, Madie Reno, MD   5 mg at 11/15/19 1124  . polyvinyl alcohol (LIQUIFILM TEARS) 1.4 % ophthalmic solution 1 drop  1 drop Both Eyes PRN Caroline Sauger, NP   1 drop at 11/15/19 1448  . tetracaine (PONTOCAINE) 0.5 % ophthalmic solution 1 drop  1 drop Left Eye Once Caroline Sauger, NP       PTA Medications: Medications Prior to Admission  Medication Sig Dispense Refill Last Dose  . albuterol (ACCUNEB) 0.63 MG/3ML nebulizer solution albuterol  sulfate 0.63 mg/3 mL solution for nebulization  Inhale 3 mL every 6 hours by inhalation route as needed.     Marland Kitchen albuterol (VENTOLIN HFA) 108 (90 Base) MCG/ACT inhaler SMARTSIG:2 Puff(s) By Mouth Every 4 Hours PRN     . B Complex Vitamins (VITAMIN B-COMPLEX) TABS Take 1 tablet by mouth daily.     Marland Kitchen BREO ELLIPTA 200-25 MCG/INH AEPB Inhale 1 puff into the lungs daily.     . carboxymethylcellulose (REFRESH PLUS) 0.5 % SOLN 1 drop daily as needed.     . Coenzyme Q10 10 MG capsule Take by mouth.     . fluticasone (FLONASE) 50 MCG/ACT nasal spray Place 2 sprays into both nostrils daily.  11   . hydrOXYzine (ATARAX/VISTARIL) 25 MG tablet every 6 (six) hours as needed. TAKE 1 TO 4 TABLETS BY MOUTH EVERY 6 HOURS AS NEEDED FOR ANXIETY     . Magnesium Oxide 400 MG CAPS magnesium 400 mg (as magnesium oxide) capsule  Take 1 capsule every day by oral route.     . neomycin-bacitracin-polymyxin (NEOSPORIN) ophthalmic ointment 1 application 4 (four) times daily.     Marland Kitchen neomycin-polymyxin b-dexamethasone (MAXITROL) 3.5-10000-0.1 OINT Place 1 application into both eyes at bedtime.     Marland Kitchen neomycin-polymyxin b-dexamethasone (MAXITROL) 3.5-10000-0.1 SUSP Place 1 drop into both eyes every 6 (six) hours.      . Oxycodone HCl 10 MG TABS Take 10 mg by mouth 2 (two) times daily as needed. 1 Tablet(s) By Mouth Twice Daily PRN     . PEPCID 20 MG tablet      . perphenazine (TRILAFON) 2 MG tablet Take 2 mg by mouth 2 (two) times daily.     Vladimir Faster Glycol-Propyl Glycol (SYSTANE) 0.4-0.3 % GEL ophthalmic gel Place 1 application into both eyes. Use between neomycin     . traZODone (DESYREL) 50 MG tablet Take 25-150 mg by mouth at bedtime. TAKE 1/2 TO 3 TABLETS BY MOUTH EVERY DAY AT BEDTIME FOR INSOMNIA     . Vitamins A & D 10000-400 units TABS Take by mouth.       Musculoskeletal: Strength & Muscle Tone: within normal limits Gait & Station: normal Patient leans: N/A  Psychiatric Specialty Exam: Physical Exam   Constitutional: She appears well-developed.  HENT:  Head: Normocephalic and atraumatic.  Eyes: Pupils are equal, round, and reactive to light. Conjunctivae are normal.  Cardiovascular: Normal heart sounds.  Respiratory: Effort normal.  GI: Soft.  Musculoskeletal:        General: Normal range of motion.     Cervical back: Normal range of motion.  Neurological: She is alert.  Skin: Skin is warm and dry.  Psychiatric: Her mood appears anxious. Her speech is delayed. She is agitated. She is not aggressive. Thought content is paranoid. She expresses impulsivity. She expresses suicidal ideation. She expresses no suicidal plans.    Review of Systems  Blood pressure 128/78, pulse 72, temperature 99 F (37.2 C), temperature source Oral, resp. rate 16, height 5' 4"  (1.626 m), weight 74.4 kg, SpO2 98 %.Body mass index is 28.15 kg/m.  General Appearance: Casual  Eye Contact:  Fair  Speech:  Clear and Coherent  Volume:  Decreased  Mood:  Dysphoric  Affect:  Constricted  Thought Process:  Disorganized  Orientation:  Full (Time, Place, and Person)  Thought Content:  Illogical, Delusions, Hallucinations: Auditory and Paranoid Ideation  Suicidal Thoughts:  Yes.  without intent/plan  Homicidal Thoughts:  No  Memory:  Immediate;   Fair Recent;   Poor Remote;   Poor  Judgement:  Impaired  Insight:  Shallow  Psychomotor Activity:  Restlessness  Concentration:  Concentration: Fair  Recall:  [employer] of Knowledge:  Fair  Language:  Fair  Akathisia:  No  Handed:  Right  AIMS (if indicated):     Assets:  Desire for Improvement Housing Resilience Social Support  ADL's:  Impaired  Cognition:  Impaired,  Mild  Sleep:  Number of Hours: 5.5    Treatment Plan Summary: Daily contact with patient to assess and evaluate symptoms and progress in treatment, Medication management and Plan 59 year old woman with longstanding psychotic symptoms.  After speaking with the husband it sounds like things  have really never gotten completely better which makes the diagnosis more unclear.  The fact that things have gotten worse in the last couple months suggest that maybe medicines were at least partially beneficial.  Patient will be continued on 15-minute checks.  Involved in individual and group therapy.  She was started on haloperidol by the admitting provider which seems a reasonable choice given she has not been on it before.  We will leave the Haldol in place.  I am going to hold off on the antidepressants for now on the chance that this may be more of a bipolar condition.  The oxycodone will be continued.  As will the gabapentin that she reports has been helpful for her pain.  Observation Level/Precautions:  15 minute checks  Laboratory:  UDS  Psychotherapy:    Medications:    Consultations:    Discharge Concerns:    Estimated LOS:  Other:     Physician Treatment Plan for Primary Diagnosis: Psychosis ([place]) Long Term Goal(s): Improvement in symptoms so as ready for discharge  Short Term Goals: Ability to disclose and discuss suicidal ideas and Ability to demonstrate self-control will improve  Physician Treatment Plan for Secondary Diagnosis: Principal Problem:   Psychosis ([place]) Active Problems:   HTN (hypertension)   GERD (gastroesophageal reflux disease)   Asthma   Chronic pain syndrome  Long Term Goal(s): Improvement in symptoms so as ready for discharge  Short Term Goals: Ability to maintain clinical measurements within normal limits will improve and Compliance with prescribed medications will improve  I certify that inpatient services furnished can reasonably be expected to improve the patient's condition.    Alethia Berthold, MD 6/17/20213:14 PM

## 2019-11-15 NOTE — [facility] Group Notes (Signed)
[facility] Group Notes:  (Nursing/MHT/Case Management/Adjunct)  Date:  11/15/2019  Time:  9:05 PM  Type of Therapy:  Group Therapy  Participation Level:  Active  Participation Quality:  Appropriate  Affect:  Appropriate  Cognitive:  Alert  Insight:  Good  Engagement in Group:  Engaged and met her goal for today.  Modes of Intervention:  Support  Summary of Progress/Problems:  [name] 11/15/2019, 9:05 PM
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
## 2019-11-16 MED ORDER — FLUOXETINE HCL 20 MG PO CAPS
20.0000 mg | ORAL_CAPSULE | Freq: Every day | ORAL | Status: DC
  Administered 2019-11-16: 20 mg via ORAL
  Filled 2019-11-16: qty 1

## 2019-11-16 MED ORDER — HALOPERIDOL 5 MG PO TABS
5.0000 mg | ORAL_TABLET | Freq: Every day | ORAL | Status: DC
  Administered 2019-11-16: 5 mg via ORAL
  Filled 2019-11-16: qty 1

## 2019-11-16 MED ORDER — HYDROXYZINE HCL 50 MG PO TABS
50.0000 mg | ORAL_TABLET | Freq: Four times a day (QID) | ORAL | Status: DC | PRN
  Administered 2019-11-16 – 2019-11-19 (×6): 50 mg via ORAL
  Filled 2019-11-16 (×6): qty 1

## 2019-11-16 NOTE — BHH Group Notes (Signed)
[place] Group Notes:  (Nursing/MHT/Case Management/Adjunct)  Date:  11/16/2019  Time:  9:48 PM  Type of Therapy:  Group Therapy  Participation Level:  Active  Participation Quality:  Appropriate  Affect:  Appropriate  Cognitive:  Appropriate  Insight:  Appropriate  Engagement in Group:  Engaged  Modes of Intervention:  Discussion  Summary of Progress/Problems:  MHT engaged in night group activity asking patient what her goal was for the day and what is her greatest treasure, also providing snack. Patient stated that she was able to reach her goal for the day by being calmer and less agitated. Patient says that he greatest treasure is her family and her granddaughter. Patient was receptive to receiving snack.   Maylene Roes 11/16/2019, 9:48 PM

## 2019-11-16 NOTE — Progress Notes (Signed)
[facility] MD Progress Note  11/16/2019 4:29 PM Victoria [name]  MRN:  [id number] Subjective: Follow-up for this 59 year old woman with psychosis.  Patient seen chart reviewed.  Patient spends most of her time alone and when I found her today she was in her room staring into space.  She told me that she was feeling very bad because she was not "receiving deliverance from God".  She makes it clear she is still hearing voices and still believes that she is under demonic attack.  Became quite tearful talking about this.  No new physical complaints.  Eating and taking care of basic hygiene. Principal Problem: Psychosis ([place]) Diagnosis: Principal Problem:   Psychosis ([place]) Active Problems:   HTN (hypertension)   GERD (gastroesophageal reflux disease)   Asthma   Chronic pain syndrome  Total Time spent with patient: 30 minutes  Past Psychiatric History: Past history of longstanding psychosis who is full nature is I think still unknown  Past Medical History:  Past Medical History:  Diagnosis Date  . Asthma   . GERD (gastroesophageal reflux disease)   . Hypercholesterolemia   . Hypertension   . Osteoarthritis of both hips   . Psychotic disorder ([place])   . Sleep apnea     Past Surgical History:  Procedure Laterality Date  . ABDOMINAL HYSTERECTOMY    . BREAST EXCISIONAL BIOPSY Right 1997   neg  . HIATAL HERNIA REPAIR     Jan 2017   Family History:  Family History  Problem Relation Age of Onset  . Heart disease Mother    Family Psychiatric  History: See previous Social History:  Social History   Substance and Sexual Activity  Alcohol Use No  . Alcohol/week: 0.0 standard drinks     Social History   Substance and Sexual Activity  Drug Use No   Comment: No use of illict drugs.    Social History   Socioeconomic History  . Marital status: Married    Spouse name: Not on file  . Number of children: Not on file  . Years of education: Not on file  . Highest education level: Not on  file  Occupational History  . Occupation: home maker  Tobacco Use  . Smoking status: Former Research scientist (life sciences)  . Smokeless tobacco: Never Used  Substance and Sexual Activity  . Alcohol use: No    Alcohol/week: 0.0 standard drinks  . Drug use: No    Comment: No use of illict drugs.  . Sexual activity: Not Currently  Other Topics Concern  . Not on file  Social History Narrative   Lives with husband at home.   Social Determinants of Health   Financial Resource Strain:   . Difficulty of Paying Living Expenses:   Food Insecurity:   . Worried About Charity fundraiser in the Last Year:   . Arboriculturist in the Last Year:   Transportation Needs:   . Film/video editor (Medical):   Marland Kitchen Lack of Transportation (Non-Medical):   Physical Activity:   . Days of Exercise per Week:   . Minutes of Exercise per Session:   Stress:   . Feeling of Stress :   Social Connections:   . Frequency of Communication with Friends and Family:   . Frequency of Social Gatherings with Friends and Family:   . Attends Religious Services:   . Active Member of Clubs or Organizations:   . Attends Archivist Meetings:   Marland Kitchen Marital Status:    Additional Social  History:                         Sleep: Fair  Appetite:  Fair  Current Medications: Current Facility-Administered Medications  Medication Dose Route Frequency Provider Last Rate Last Admin  . acetaminophen (TYLENOL) tablet 650 mg  650 mg Oral Q6H PRN Caroline Sauger, NP   650 mg at 11/15/19 2129  . alum & mag hydroxide-simeth (MAALOX/MYLANTA) 200-200-20 MG/5ML suspension 30 mL  30 mL Oral Q4H PRN Caroline Sauger, NP      . benztropine (COGENTIN) tablet 0.5 mg  0.5 mg Oral QHS Caroline Sauger, NP   0.5 mg at 11/15/19 2129  . ciprofloxacin (CILOXAN) 0.3 % ophthalmic solution 2 drop  2 drop Left Eye Q6H Caroline Sauger, NP   2 drop at 11/16/19 1558  . fluorescein ophthalmic strip 2 strip  2 strip Left Eye Once  Caroline Sauger, NP      . FLUoxetine (PROZAC) capsule 20 mg  20 mg Oral Daily Bao Coreas T, MD      . haloperidol (HALDOL) tablet 5 mg  5 mg Oral QHS Kassadee Carawan T, MD      . hydrOXYzine (ATARAX/VISTARIL) tablet 50 mg  50 mg Oral Q6H PRN Chakia Counts T, MD      . magnesium hydroxide (MILK OF MAGNESIA) suspension 30 mL  30 mL Oral Daily PRN Caroline Sauger, NP      . oxyCODONE (Oxy IR/ROXICODONE) immediate release tablet 5 mg  5 mg Oral Q6H PRN Naureen Benton, Madie Reno, MD   5 mg at 11/16/19 1210  . polyvinyl alcohol (LIQUIFILM TEARS) 1.4 % ophthalmic solution 1 drop  1 drop Both Eyes PRN Caroline Sauger, NP   1 drop at 11/16/19 1208    Lab Results:  Results for orders placed or performed during the hospital encounter of 11/14/19 (from the past 48 hour(s))  SARS Coronavirus 2 by RT PCR (hospital order, performed in [facility] hospital lab) Nasopharyngeal Nasopharyngeal Swab     Status: None   Collection Time: 11/14/19  8:47 PM   Specimen: Nasopharyngeal Swab  Result Value Ref Range   SARS Coronavirus 2 NEGATIVE NEGATIVE    Comment: (NOTE) SARS-CoV-2 target nucleic acids are NOT DETECTED.  The SARS-CoV-2 RNA is generally detectable in upper and lower respiratory specimens during the acute phase of infection. The lowest concentration of SARS-CoV-2 viral copies this assay can detect is 250 copies / mL. A negative result does not preclude SARS-CoV-2 infection and should not be used as the sole basis for treatment or other patient management decisions.  A negative result may occur with improper specimen collection / handling, submission of specimen other than nasopharyngeal swab, presence of viral mutation(s) within the areas targeted by this assay, and inadequate number of viral copies (<250 copies / mL). A negative result must be combined with clinical observations, patient history, and epidemiological information.  Fact Sheet for Patients:    StrictlyIdeas.no  Fact Sheet for Healthcare Providers: BankingDealers.co.za  This test is not yet approved or  cleared by the [place] FDA and has been authorized for detection and/or diagnosis of SARS-CoV-2 by FDA under an Emergency Use Authorization (EUA).  This EUA will remain in effect (meaning this test can be used) for the duration of the COVID-19 declaration under Section 564(b)(1) of the Act, 21 U.S.C. section 360bbb-3(b)(1), unless the authorization is terminated or revoked sooner.  Performed at [facility], [address], [place], [place] [address]  Blood Alcohol level:  Lab Results  Component Value Date   ETH <10 11/14/2019   ETH <5 44/81/8563    Metabolic Disorder Labs: Lab Results  Component Value Date   HGBA1C 4.8 07/06/2015   Lab Results  Component Value Date   PROLACTIN 9.7 07/06/2015   Lab Results  Component Value Date   CHOL 205 (H) 07/06/2015   TRIG 103 07/06/2015   HDL 50 07/06/2015   CHOLHDL 4.1 07/06/2015   VLDL 21 07/06/2015   LDLCALC 134 (H) 07/06/2015    Physical Findings: AIMS: Facial and Oral Movements Muscles of Facial Expression: None, normal Lips and Perioral Area: None, normal Jaw: None, normal Tongue: None, normal,Extremity Movements Upper (arms, wrists, hands, fingers): None, normal Lower (legs, knees, ankles, toes): None, normal, Trunk Movements Neck, shoulders, hips: None, normal, Overall Severity Severity of abnormal movements (highest score from questions above): None, normal Incapacitation due to abnormal movements: None, normal Patient's awareness of abnormal movements (rate only patient's report): No Awareness, Dental Status Current problems with teeth and/or dentures?: No Does patient usually wear dentures?: No  CIWA:    COWS:     Musculoskeletal: Strength & Muscle Tone: decreased Gait & Station: normal Patient leans: N/A  Psychiatric Specialty  Exam: Physical Exam  Nursing note and vitals reviewed. Constitutional: She appears well-developed.  HENT:  Head: Normocephalic and atraumatic.  Eyes: Pupils are equal, round, and reactive to light. Conjunctivae are normal.  Cardiovascular: Normal heart sounds.  Respiratory: Effort normal.  GI: Soft.  Musculoskeletal:        General: Normal range of motion.     Cervical back: Normal range of motion.  Neurological: She is alert.  Skin: Skin is warm and dry.  Psychiatric: Her speech is tangential. She is agitated and slowed. She is not aggressive. Thought content is paranoid and delusional. She expresses impulsivity. She exhibits a depressed mood. She is inattentive.    Review of Systems  Constitutional: Negative.   HENT: Negative.   Eyes: Negative.   Respiratory: Negative.   Cardiovascular: Negative.   Gastrointestinal: Negative.   Musculoskeletal: Negative.   Skin: Negative.   Neurological: Negative.   Psychiatric/Behavioral: Positive for dysphoric mood and hallucinations.    Blood pressure (!) 116/59, pulse 65, temperature 98.8 F (37.1 C), temperature source Oral, resp. rate 16, height 5' 4"  (1.626 m), weight 74.4 kg, SpO2 97 %.Body mass index is 28.15 kg/m.  General Appearance: Casual  Eye Contact:  Minimal  Speech:  Slow  Volume:  Decreased  Mood:  Depressed  Affect:  Congruent  Thought Process:  Coherent  Orientation:  Full (Time, Place, and Person)  Thought Content:  Illogical, Delusions, Hallucinations: Auditory, Paranoid Ideation and Rumination  Suicidal Thoughts:  No  Homicidal Thoughts:  No  Memory:  Immediate;   Fair Recent;   Fair Remote;   Fair  Judgement:  Impaired  Insight:  Shallow  Psychomotor Activity:  Decreased  Concentration:  Concentration: Poor  Recall:  Poor  Fund of Knowledge:  Fair  Language:  Fair  Akathisia:  No  Handed:  Right  AIMS (if indicated):     Assets:  Desire for Improvement Housing Resilience  ADL's:  Intact  Cognition:   Impaired,  Mild  Sleep:  Number of Hours: 6     Treatment Plan Summary: Daily contact with patient to assess and evaluate symptoms and progress in treatment, Medication management and Plan Talking with her today it looks in perspective even more like a longstanding psychotic depression.  She tolerated the initial dose of Haldol last night.  I am going to increase that to 5 mg tonight.  She does not care for the Klonopin which will be discontinued in favor of her preference for Vistaril.  Restart antidepressant with fluoxetine today.  Reassurance and psychoeducation to the patient.  I did not broach the subject of ECT but I think it is possible that she may turn out to be an ECT candidate if this kind of psychotic depression continues to seem to be the main issue  Alethia Berthold, MD 11/16/2019, 4:29 PM
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
## 2019-11-16 NOTE — Plan of Care (Signed)
Patient stated she was anxious/agitated and requested PRN medications. See MAR.   Problem: Education: Goal: Emotional status will improve Outcome: Not Progressing Goal: Mental status will improve Outcome: Not Progressing

## 2019-11-16 NOTE — Progress Notes (Signed)
Recreation Therapy Notes   Date: 11/16/2019  Time: 9:30 am  Location: Craft room   Behavioral response: Appropriate  Intervention Topic: Happiness    Discussion/Intervention:  Group content today was focused on Happiness. The group defined happiness and described where happiness comes from. Individuals identified what makes them happy and how they go about making others happy. Patients expressed things that stop them from being happy and ways they can improve their happiness. The group stated reasons why it is important to be happy. The group participated in the intervention "My Happiness", where they had a chance to identify and express things that make them happy. Clinical Observations/Feedback:  Patient came to group and defined happiness as being giving to someone. She stated that happiness is important for a healthy functional life. Individual was social with peers and staff while participating on the intervention.  Arcangel Minion LRT/CTRS         Bryan Omura 11/16/2019 12:00 PM

## 2019-11-16 NOTE — Progress Notes (Signed)
Recreation Therapy Notes  INPATIENT RECREATION THERAPY ASSESSMENT  Patient Details Name: Codi Folkerts MRN: [id number] DOB: 06/08/1960 Today's Date: 11/16/2019       Information Obtained From: Patient  Able to Participate in Assessment/Interview: Yes  Patient Presentation: Responsive  Reason for Admission (Per Patient): Active Symptoms, Suicidal Ideation  Patient Stressors:    Coping Skills:   Exercise, Talk  Leisure Interests (2+):  Nature - Vegetable gardening, Therapist, music - [employer], Exercise - Walking (Ride horses)  Frequency of Recreation/Participation: Monthly  Awareness of Community Resources:  Yes  Community Resources:  Park  Current Use:    If no, Barriers?:    Expressed Interest in [place] of Residence:  Insurance underwriter  Patient Main Form of Transportation: Musician  Patient Strengths:  Kind, giving  Patient Identified Areas of Improvement:  Be more patient and more social  Patient Goal for Hospitalization:  To be a productive citizen  Current SI (including self-harm):  No  Current HI:  No  Current AVH: No  Staff Intervention Plan: Group Attendance, Collaborate with Interdisciplinary Treatment Team  Consent to Intern Participation: N/A  Cadi Rhinehart 11/16/2019, 2:45 PM

## 2019-11-16 NOTE — BHH Group Notes (Signed)
LCSW Group Therapy Note  11/16/2019 1:00 PM  Type of Therapy/Topic:  Group Therapy:  Emotion Regulation  Participation Level:  Active   Description of Group:   The purpose of this group is to assist patients in learning to regulate negative emotions and experience positive emotions. Patients will be guided to discuss ways in which they have been vulnerable to their negative emotions. These vulnerabilities will be juxtaposed with experiences of positive emotions or situations, and patients will be challenged to use positive emotions to combat negative ones. Special emphasis will be placed on coping with negative emotions in conflict situations, and patients will process healthy conflict resolution skills.  Therapeutic Goals: 1. Patient will identify two positive emotions or experiences to reflect on in order to balance out negative emotions 2. Patient will label two or more emotions that they find the most difficult to experience 3. Patient will demonstrate positive conflict resolution skills through discussion and/or role plays  Summary of Patient Progress: Patient was present for group. 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  Patient was supportive of other group members.  Patient was appropriate for group.   Therapeutic Modalities:   Cognitive Behavioral Therapy Feelings Identification Dialectical Behavioral Therapy  Assunta Curtis, MSW, LCSW 11/16/2019 2:26 PM

## 2019-11-16 NOTE — Progress Notes (Signed)
Patient pleasant during assessment denying SI/HI/AVH. Patient endorses pain, (See MAR). Patient endorses anxiety and depression, rating them 8/10. Patient given support and encouragement to be active in her treatment plan. Patient observed interacting appropriately with staff and peers on the unit. Pt being monitored Q 15 minutes for safety per unit protocol. Patient remains safe on the unit.

## 2019-11-17 DIAGNOSIS — I10 Essential (primary) hypertension: Secondary | ICD-10-CM

## 2019-11-17 DIAGNOSIS — J45909 Unspecified asthma, uncomplicated: Secondary | ICD-10-CM

## 2019-11-17 DIAGNOSIS — K2100 Gastro-esophageal reflux disease with esophagitis, without bleeding: Secondary | ICD-10-CM

## 2019-11-17 DIAGNOSIS — G894 Chronic pain syndrome: Secondary | ICD-10-CM

## 2019-11-17 LAB — HEMOGLOBIN A1C
Hgb A1c MFr Bld: 5.5 % (ref 4.8–5.6)
Mean Plasma Glucose: 111.15 mg/dL

## 2019-11-17 MED ORDER — METOPROLOL SUCCINATE ER 25 MG PO TB24
25.0000 mg | ORAL_TABLET | Freq: Every day | ORAL | Status: DC
  Administered 2019-11-17 – 2019-11-19 (×2): 25 mg via ORAL
  Filled 2019-11-17 (×2): qty 1

## 2019-11-17 MED ORDER — LURASIDONE HCL 40 MG PO TABS
40.0000 mg | ORAL_TABLET | Freq: Every day | ORAL | Status: DC
  Administered 2019-11-18: 40 mg via ORAL
  Filled 2019-11-17: qty 1

## 2019-11-17 MED ORDER — TRAZODONE HCL 50 MG PO TABS
50.0000 mg | ORAL_TABLET | Freq: Every evening | ORAL | Status: DC | PRN
  Administered 2019-11-17 – 2019-11-18 (×2): 50 mg via ORAL
  Filled 2019-11-17 (×2): qty 1

## 2019-11-17 MED ORDER — GABAPENTIN 100 MG PO CAPS
100.0000 mg | ORAL_CAPSULE | Freq: Three times a day (TID) | ORAL | Status: DC
  Administered 2019-11-17 – 2019-11-19 (×5): 100 mg via ORAL
  Filled 2019-11-17 (×6): qty 1

## 2019-11-17 MED ORDER — PANTOPRAZOLE SODIUM 40 MG PO TBEC
40.0000 mg | DELAYED_RELEASE_TABLET | Freq: Every day | ORAL | Status: DC
  Administered 2019-11-17 – 2019-11-19 (×3): 40 mg via ORAL
  Filled 2019-11-17 (×3): qty 1

## 2019-11-17 MED ORDER — ESCITALOPRAM OXALATE 10 MG PO TABS
10.0000 mg | ORAL_TABLET | Freq: Every day | ORAL | Status: DC
  Administered 2019-11-17 – 2019-11-19 (×3): 10 mg via ORAL
  Filled 2019-11-17 (×3): qty 1

## 2019-11-17 MED ORDER — VITAMIN D 25 MCG (1000 UNIT) PO TABS
1000.0000 [IU] | ORAL_TABLET | Freq: Every day | ORAL | Status: DC
  Administered 2019-11-17 – 2019-11-19 (×3): 1000 [IU] via ORAL
  Filled 2019-11-17 (×3): qty 1

## 2019-11-17 MED ORDER — ALBUTEROL SULFATE HFA 108 (90 BASE) MCG/ACT IN AERS
1.0000 | INHALATION_SPRAY | Freq: Four times a day (QID) | RESPIRATORY_TRACT | Status: DC | PRN
  Filled 2019-11-17: qty 6.7

## 2019-11-17 MED ORDER — HALOPERIDOL 5 MG PO TABS
5.0000 mg | ORAL_TABLET | Freq: Three times a day (TID) | ORAL | Status: DC | PRN
  Administered 2019-11-17 – 2019-11-18 (×2): 5 mg via ORAL
  Filled 2019-11-17 (×2): qty 1

## 2019-11-17 NOTE — B[facility] Suicide Risk Assessment (Signed)
B[facility] INPATIENT:  Family/Significant Other Suicide Prevention Education  Suicide Prevention Education:  Contact Attempts: Pt's husband, Trudi Morgenthaler, (name of family member/significant other) has been identified by the patient as the family member/significant other with whom the patient will be residing, and identified as the person(s) who will aid the patient in the event of a mental health crisis.  With written consent from the patient, two attempts were made to provide suicide prevention education, prior to and/or following the patient's discharge.  We were unsuccessful in providing suicide prevention education.  A suicide education pamphlet was given to the patient to share with family/significant other.  Date and time of first attempt: 11/17/2019 @ 1:50pm. CSW left a HIPPA compliant voicemail.  Date and time of second attempt:  Victoria [name] 11/17/2019, 1:52 PM

## 2019-11-17 NOTE — BHH Group Notes (Signed)
[facility]: Lambert Rhonda W LCSW Group Therapy Note  Date/Time: 11/17/2019 @ 1:30PM  Type of Therapy and Topic:  Group Therapy:  Overcoming Obstacles  Participation Level:  BHH PARTICIPATION LEVEL: Did Not Attend  Description of Group:    In this group patients will be encouraged to explore what they see as obstacles to their own wellness and recovery. They will be guided to discuss their thoughts, feelings, and behaviors related to these obstacles. The group will process together ways to cope with barriers, with attention given to specific choices patients can make. Each patient will be challenged to identify changes they are motivated to make in order to overcome their obstacles. This group will be process-oriented, with patients participating in exploration of their own experiences as well as giving and receiving support and challenge from other group members.  Therapeutic Goals: 1. Patient will identify personal and current obstacles as they relate to admission. 2. Patient will identify barriers that currently interfere with their wellness or overcoming obstacles.  3. Patient will identify feelings, thought process and behaviors related to these barriers. 4. Patient will identify two changes they are willing to make to overcome these obstacles:    Summary of Patient Progress   Unit did not have group due to going outside for rec time with tecs.    Therapeutic Modalities:   Cognitive Behavioral Therapy Solution Focused Therapy Motivational Interviewing Relapse Prevention Therapy   Ardelle Anton, LCSW

## 2019-11-17 NOTE — Plan of Care (Signed)
Pt rates depression and hopelessness both 5/10. Pt rates anxiety 7/10. Pt denies SI, HI and AVH. Pt was educated on care plan and verbalizes understanding. Pt was encouraged to attend groups. Collier Bullock RN  Problem: Education: Goal: Knowledge of [facility] Education information/materials will improve Outcome: Progressing Goal: Emotional status will improve Outcome: Progressing Goal: Mental status will improve Outcome: Progressing Goal: Verbalization of understanding the information provided will improve Outcome: Progressing   Problem: Activity: Goal: Interest or engagement in activities will improve Outcome: Progressing Goal: Sleeping patterns will improve Outcome: Progressing   Problem: Coping: Goal: Ability to verbalize frustrations and anger appropriately will improve Outcome: Progressing Goal: Ability to demonstrate self-control will improve Outcome: Progressing   Problem: Coping: Goal: Ability to verbalize frustrations and anger appropriately will improve Outcome: Progressing Goal: Ability to demonstrate self-control will improve Outcome: Progressing   Problem: Health Behavior/Discharge Planning: Goal: Identification of resources available to assist in meeting health care needs will improve Outcome: Progressing Goal: Compliance with treatment plan for underlying cause of condition will improve Outcome: Progressing   Problem: Physical Regulation: Goal: Ability to maintain clinical measurements within normal limits will improve Outcome: Progressing   Problem: Physical Regulation: Goal: Ability to maintain clinical measurements within normal limits will improve Outcome: Progressing   Problem: Safety: Goal: Periods of time without injury will increase Outcome: Progressing   Problem: Activity: Goal: Will verbalize the importance of balancing activity with adequate rest periods Outcome: Progressing   Problem: Education: Goal: Will be free of psychotic  symptoms Outcome: Progressing Goal: Knowledge of the prescribed therapeutic regimen will improve Outcome: Progressing   Problem: Education: Goal: Will be free of psychotic symptoms Outcome: Progressing Goal: Knowledge of the prescribed therapeutic regimen will improve Outcome: Progressing   Problem: Coping: Goal: Coping ability will improve Outcome: Progressing Goal: Will verbalize feelings Outcome: Progressing   Problem: Health Behavior/Discharge Planning: Goal: Compliance with prescribed medication regimen will improve Outcome: Progressing   Problem: Nutritional: Goal: Ability to achieve adequate nutritional intake will improve Outcome: Progressing   Problem: Role Relationship: Goal: Ability to communicate needs accurately will improve Outcome: Progressing Goal: Ability to interact with others will improve Outcome: Progressing   Problem: Safety: Goal: Ability to redirect hostility and anger into socially appropriate behaviors will improve Outcome: Progressing Goal: Ability to remain free from injury will improve Outcome: Progressing   Problem: Self-Care: Goal: Ability to participate in self-care as condition permits will improve Outcome: Progressing   Problem: Self-Concept: Goal: Will verbalize positive feelings about self Outcome: Progressing

## 2019-11-17 NOTE — Progress Notes (Signed)
Patient pleasant during assessment denying SI/HI/AVH. Patient endorses pain, (See MAR). Patient endorses anxiety and depression, rating them 7/10. Patient given support and encouragement to be active in her treatment plan. Patient observed interacting appropriately with staff and peers on the unit. Pt being monitored Q 15 minutes for safety per unit protocol. Patient remains safe on the unit. Patient upset about some of her medications, patient given education.

## 2019-11-17 NOTE — Progress Notes (Signed)
[facility] MD Progress Note  11/17/2019 10:46 AM Victoria [name]  MRN:  [id number] Subjective: Patient is a 59 year old female with an unspecified psychiatric illness who presented to the [facility] emergency department on 11/14/2019 with suicidal ideation.  The patient was paranoid and having somatic issues as well.  She reportedly had been off medications x3 months.  There was some suspicion that this was major depression with psychotic features.  Objective: Patient is seen and examined.  Patient is a 59 year old female with the above-stated past psychiatric history is seen in follow-up.  She continues to have depression as well as psychotic features.  She remains paranoid.  She will not disclose a great deal to me, but did admit that there was "a group after me".  She also stated that they were using "black magic".  She stated that she had been started on fluoxetine here in the hospital, but "I do not tolerate that".  She has to have a change.  In the emergency department it appears as though she had been started on Haldol 3 mg p.o. nightly, clonazepam 1 mg p.o. twice daily and venlafaxine extended release 37.5 milligrams p.o. daily.  Currently she was on the fluoxetine, Haldol 5 mg p.o. nightly.  Review of the electronic medical record revealed a neurosurgical note from [place] in February 2021.  It revealed that she was taking duloxetine 30 mg p.o. daily, Neurontin 400 mg p.o. twice daily, methylphenidate, Seroquel 200 mg p.o. nightly, topiramate, Lexapro 20 mg p.o. daily, Latuda 40 mg and Trintellix.  There was no specification of who had been prescribing these medications.  Review of the PMP database revealed a prescription for oxycodone 90 tablets on 10/23/2019.  She had received a written prescription on 09/19/2019 from a physician in [place], [place].  That medication has been refilled several times over the last year.  It does look like the most recent  antipsychotic the patient had been prescribed was the [place].  This was 80 mg a day.  Her Lexapro was 30 mg p.o. daily.  Review of the electronic record back in 2014 while she was a patient at [facility] she apparently was being treated with ziprasidone, clonazepam, Risperdal, Cymbalta, trazodone.  It looks like her running diagnosis through those times was still psychosis.  I was unable to find any specific diagnosis or psychiatric notes in the electronic medical record.  Principal Problem: Psychosis ([place]) Diagnosis: Principal Problem:   Psychosis ([place]) Active Problems:   HTN (hypertension)   GERD (gastroesophageal reflux disease)   Asthma   Chronic pain syndrome  Total Time spent with patient: 45 minutes  Past Psychiatric History: See admission H&P  Past Medical History:  Past Medical History:  Diagnosis Date  . Asthma   . GERD (gastroesophageal reflux disease)   . Hypercholesterolemia   . Hypertension   . Osteoarthritis of both hips   . Psychotic disorder ([place])   . Sleep apnea     Past Surgical History:  Procedure Laterality Date  . ABDOMINAL HYSTERECTOMY    . BREAST EXCISIONAL BIOPSY Right 1997   neg  . HIATAL HERNIA REPAIR     Jan 2017   Family History:  Family History  Problem Relation Age of Onset  . Heart disease Mother    Family Psychiatric  History: See admission H&P Social History:  Social History   Substance and Sexual Activity  Alcohol Use No  . Alcohol/week: 0.0 standard drinks     Social  History   Substance and Sexual Activity  Drug Use No   Comment: No use of illict drugs.    Social History   Socioeconomic History  . Marital status: Married    Spouse name: Not on file  . Number of children: Not on file  . Years of education: Not on file  . Highest education level: Not on file  Occupational History  . Occupation: home maker  Tobacco Use  . Smoking status: Former Research scientist (life sciences)  . Smokeless tobacco: Never Used  Substance and Sexual  Activity  . Alcohol use: No    Alcohol/week: 0.0 standard drinks  . Drug use: No    Comment: No use of illict drugs.  . Sexual activity: Not Currently  Other Topics Concern  . Not on file  Social History Narrative   Lives with husband at home.   Social Determinants of Health   Financial Resource Strain:   . Difficulty of Paying Living Expenses:   Food Insecurity:   . Worried About Charity fundraiser in the Last Year:   . Arboriculturist in the Last Year:   Transportation Needs:   . Film/video editor (Medical):   Marland Kitchen Lack of Transportation (Non-Medical):   Physical Activity:   . Days of Exercise per Week:   . Minutes of Exercise per Session:   Stress:   . Feeling of Stress :   Social Connections:   . Frequency of Communication with Friends and Family:   . Frequency of Social Gatherings with Friends and Family:   . Attends Religious Services:   . Active Member of Clubs or Organizations:   . Attends Archivist Meetings:   Marland Kitchen Marital Status:    Additional Social History:                         Sleep: Fair  Appetite:  Fair  Current Medications: Current Facility-Administered Medications  Medication Dose Route Frequency Provider Last Rate Last Admin  . acetaminophen (TYLENOL) tablet 650 mg  650 mg Oral Q6H PRN Caroline Sauger, NP   650 mg at 11/15/19 2129  . alum & mag hydroxide-simeth (MAALOX/MYLANTA) 200-200-20 MG/5ML suspension 30 mL  30 mL Oral Q4H PRN Caroline Sauger, NP      . benztropine (COGENTIN) tablet 0.5 mg  0.5 mg Oral QHS Caroline Sauger, NP   0.5 mg at 11/16/19 2138  . ciprofloxacin (CILOXAN) 0.3 % ophthalmic solution 2 drop  2 drop Left Eye Q6H Caroline Sauger, NP   2 drop at 11/17/19 0821  . escitalopram (LEXAPRO) tablet 10 mg  10 mg Oral Daily Sharma Covert, MD      . fluorescein ophthalmic strip 2 strip  2 strip Left Eye Once Caroline Sauger, NP      . haloperidol (HALDOL) tablet 5 mg  5 mg Oral QHS  Clapacs, Madie Reno, MD   5 mg at 11/16/19 2138  . hydrOXYzine (ATARAX/VISTARIL) tablet 50 mg  50 mg Oral Q6H PRN Clapacs, Madie Reno, MD   50 mg at 11/16/19 2139  . magnesium hydroxide (MILK OF MAGNESIA) suspension 30 mL  30 mL Oral Daily PRN Caroline Sauger, NP      . oxyCODONE (Oxy IR/ROXICODONE) immediate release tablet 5 mg  5 mg Oral Q6H PRN Clapacs, Madie Reno, MD   5 mg at 11/17/19 0825  . polyvinyl alcohol (LIQUIFILM TEARS) 1.4 % ophthalmic solution 1 drop  1 drop Both Eyes PRN Caroline Sauger,  NP   1 drop at 11/17/19 2409    Lab Results: No results found for this or any previous visit (from the past 48 hour(s)).  Blood Alcohol level:  Lab Results  Component Value Date   ETH <10 11/14/2019   ETH <5 73/53/2992    Metabolic Disorder Labs: Lab Results  Component Value Date   HGBA1C 4.8 07/06/2015   Lab Results  Component Value Date   PROLACTIN 9.7 07/06/2015   Lab Results  Component Value Date   CHOL 205 (H) 07/06/2015   TRIG 103 07/06/2015   HDL 50 07/06/2015   CHOLHDL 4.1 07/06/2015   VLDL 21 07/06/2015   LDLCALC 134 (H) 07/06/2015    Physical Findings: AIMS: Facial and Oral Movements Muscles of Facial Expression: None, normal Lips and Perioral Area: None, normal Jaw: None, normal Tongue: None, normal,Extremity Movements Upper (arms, wrists, hands, fingers): None, normal Lower (legs, knees, ankles, toes): None, normal, Trunk Movements Neck, shoulders, hips: None, normal, Overall Severity Severity of abnormal movements (highest score from questions above): None, normal Incapacitation due to abnormal movements: None, normal Patient's awareness of abnormal movements (rate only patient's report): No Awareness, Dental Status Current problems with teeth and/or dentures?: No Does patient usually wear dentures?: No  CIWA:    COWS:     Musculoskeletal: Strength & Muscle Tone: within normal limits Gait & Station: normal Patient leans: N/A  Psychiatric Specialty  Exam: Physical Exam  Nursing note and vitals reviewed. Constitutional: She is oriented to person, place, and time.  HENT:  Head: Normocephalic and atraumatic.  Respiratory: Effort normal.  GI: Normal appearance.  Neurological: She is alert and oriented to person, place, and time.    Review of Systems  Blood pressure (!) 151/82, pulse 62, temperature 98.4 F (36.9 C), temperature source Oral, resp. rate 18, height 5' 4"  (1.626 m), weight 74.4 kg, SpO2 100 %.Body mass index is 28.15 kg/m.  General Appearance: Casual  Eye Contact:  Fair  Speech:  Normal Rate  Volume:  Decreased  Mood:  Anxious, Depressed and Dysphoric  Affect:  Flat  Thought Process:  Goal Directed and Descriptions of Associations: Circumstantial  Orientation:  Full (Time, Place, and Person)  Thought Content:  Delusions and Paranoid Ideation  Suicidal Thoughts:  Yes.  without intent/plan  Homicidal Thoughts:  No  Memory:  Immediate;   Fair Recent;   Fair Remote;   Fair  Judgement:  Impaired  Insight:  Lacking  Psychomotor Activity:  Normal  Concentration:  Concentration: Fair and Attention Span: Fair  Recall:  [employer] of Knowledge:  Fair  Language:  Good  Akathisia:  Negative  Handed:  Right  AIMS (if indicated):     Assets:  Desire for Improvement Resilience  ADL's:  Intact  Cognition:  WNL  Sleep:  Number of Hours: 6.5     Treatment Plan Summary: Daily contact with patient to assess and evaluate symptoms and progress in treatment, Medication management and Plan : Patient is seen and examined.  Patient is a 59 year old female with the above-stated past psychiatric history who is seen in follow-up.   Diagnosis: 1.  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 2.  Degenerative disc disease. 3.  Elevated blood pressure. 4.  It appears that she has a history of diabetes mellitus type 2 in the electronic medical record. 5.  Mildly elevated creatinine 6.   Hyperlipidemia 7.  Vitamin D deficiency 8.  Asthma versus COPD 9.  GERD  Pertinent findings on examination today: 1.  Continued paranoid delusions. 2.  Elevated blood pressure. 3.  Suspicion of depression and anxiety.  Plan: 1.  Stop fluoxetine. 2.  Start Escitalopram 10 mg p.o. daily for depression and anxiety. 3.  Change haloperidol to 5 mg p.o. twice daily as needed insomnia, agitation. 4.  Restart Latuda 40 mg p.o. q. evening meal starting with tonight.  This is for psychosis. 5.  From the old records she had been previously treated with metoprolol for her hypertension.  We will start 12.5 mg of metoprolol succinate daily for hypertension. 6.  Restart Protonix 40 mg p.o. daily for gastric protection. 7.  Order hemoglobin A1c as well as daily blood sugars. 8.  Stop Cogentin. 9.  Start gabapentin 100 mg p.o. 3 times daily for chronic pain at least in the short run. 10.  Write for albuterol as needed for shortness of breath. 24.  Write for proair HFA as needed for COPD versus asthma. 12.  Disposition planning-in progress.  Sharma Covert, MD 11/17/2019, 10:46 AM

## 2019-11-17 NOTE — Progress Notes (Signed)
D- Patient alert and oriented. Affect is anxious and mood is "agitated".Patient denies SI, HI, AVH, and pain.   A- Scheduled medications administered to patient, per MD orders. Support and encouragement provided.  Routine safety checks conducted every 15 minutes.  Patient informed to notify staff with problems or concerns.  R- No adverse drug reactions noted. Patient contracts for safety at this time. Patient compliant with medications and treatment plan. Patient receptive, calm, and cooperative. Patient interacts well with others on the unit.  Patient remains safe at this time.  Collier Bullock RN

## 2019-11-17 NOTE — Plan of Care (Signed)
Patient upset about some of her medications tonight. Patient given education.   Problem: Education: Goal: Emotional status will improve Outcome: Not Progressing Goal: Mental status will improve Outcome: Not Progressing

## 2019-11-18 MED ORDER — LURASIDONE HCL 40 MG PO TABS
80.0000 mg | ORAL_TABLET | Freq: Every day | ORAL | Status: DC
  Administered 2019-11-19: 80 mg via ORAL
  Filled 2019-11-18: qty 2

## 2019-11-18 MED ORDER — TRAMADOL HCL 50 MG PO TABS
50.0000 mg | ORAL_TABLET | Freq: Four times a day (QID) | ORAL | Status: DC | PRN
  Administered 2019-11-18 – 2019-11-19 (×2): 50 mg via ORAL
  Filled 2019-11-18 (×2): qty 1

## 2019-11-18 MED ORDER — POLYVINYL ALCOHOL 1.4 % OP SOLN
1.0000 [drp] | Freq: Three times a day (TID) | OPHTHALMIC | Status: DC
  Administered 2019-11-18 – 2019-11-19 (×4): 1 [drp] via OPHTHALMIC
  Filled 2019-11-18: qty 15

## 2019-11-18 MED ORDER — FLUTICASONE PROPIONATE 50 MCG/ACT NA SUSP
2.0000 | Freq: Every day | NASAL | Status: DC
  Administered 2019-11-19: 2 via NASAL
  Filled 2019-11-18: qty 16

## 2019-11-18 MED ORDER — FLUTICASONE FUROATE-VILANTEROL 100-25 MCG/INH IN AEPB
1.0000 | INHALATION_SPRAY | Freq: Every day | RESPIRATORY_TRACT | Status: DC
  Administered 2019-11-19: 1 via RESPIRATORY_TRACT
  Filled 2019-11-18: qty 28

## 2019-11-18 NOTE — [facility] Group Notes (Signed)
LCSW Group Therapy Note  Date/Time:  11/18/2019  1:00PM  Type of Therapy and Topic:  Group Therapy:  Healthy and Unhealthy Supports  Participation Level:  Did Not Attend   Description of Group:  Patients in this group were introduced to the idea of adding a variety of healthy supports to address the various needs in their lives.Patients discussed what additional healthy supports could be helpful in their recovery and wellness after discharge in order to prevent future hospitalizations.   An emphasis was placed on using counselor, doctor, therapy groups, 12-step groups, and problem-specific support groups to expand supports.  They also worked as a group on developing a specific plan for several patients to deal with unhealthy supports through [place], psychoeducation with loved ones, and even termination of relationships.   Therapeutic Goals:   1)  discuss importance of adding supports to stay well once out of the hospital  2)  compare healthy versus unhealthy supports and identify some examples of each  3)  generate ideas and descriptions of healthy supports that can be added  4)  offer mutual support about how to address unhealthy supports  5)  encourage active participation in and adherence to discharge plan    Summary of Patient Progress:    Patient did not attend group.   Therapeutic Modalities:   Motivational Interviewing Brief Solution-Focused Therapy   Netta Neat, MSW, LCSW

## 2019-11-18 NOTE — Progress Notes (Signed)
[facility] MD Progress Note  11/18/2019 11:16 AM Victoria [name]  MRN:  [id number] Subjective:  Patient is a 59 year old female with an unspecified psychiatric illness who presented to the [facility] emergency department on 11/14/2019 with suicidal ideation.  The patient was paranoid and having somatic issues as well.  She reportedly had been off medications x3 months.  There was some suspicion that this was major depression with psychotic features.  Objective: Patient is seen and examined.  Patient is a 59 year old female with the above-stated past psychiatric history who is seen in follow-up.  She seems slightly better today.  She was started on Lexapro and Latuda yesterday.  Review of her records from [facility] at least suggested that she done better on a total dose of 30 mg of Lexapro as well as the [place].  She denied any auditory or visual hallucinations today.  She stated that she has no control of her "the black magic".  She stated she would like to stop the narcotic pain medications and be placed on tramadol.  She also is again requesting her Breo inhaler.  I reviewed with her the information that I found in the chart with regard to her previous treatment for her psychiatric illness.  I suspect that this is not major depression with psychotic features but is more likely schizoaffective disorder versus schizophrenia.  Her vital signs are stable, she is afebrile.  Her heart rate is 55 today.  She slept 6.5 hours last night.  No new laboratories.  She denied any gross auditory or visual hallucinations.  She denied any suicidal or homicidal ideation.  Principal Problem: Psychosis ([place]) Diagnosis: Principal Problem:   Psychosis ([place]) Active Problems:   HTN (hypertension)   GERD (gastroesophageal reflux disease)   Asthma   Chronic pain syndrome  Total Time spent with patient: 20 minutes  Past Psychiatric History: See admission H&P  Past Medical History:  Past Medical  History:  Diagnosis Date  . Asthma   . GERD (gastroesophageal reflux disease)   . Hypercholesterolemia   . Hypertension   . Osteoarthritis of both hips   . Psychotic disorder ([place])   . Sleep apnea     Past Surgical History:  Procedure Laterality Date  . ABDOMINAL HYSTERECTOMY    . BREAST EXCISIONAL BIOPSY Right 1997   neg  . HIATAL HERNIA REPAIR     Jan 2017   Family History:  Family History  Problem Relation Age of Onset  . Heart disease Mother    Family Psychiatric  History: See admission H&P Social History:  Social History   Substance and Sexual Activity  Alcohol Use No  . Alcohol/week: 0.0 standard drinks     Social History   Substance and Sexual Activity  Drug Use No   Comment: No use of illict drugs.    Social History   Socioeconomic History  . Marital status: Married    Spouse name: Not on file  . Number of children: Not on file  . Years of education: Not on file  . Highest education level: Not on file  Occupational History  . Occupation: home maker  Tobacco Use  . Smoking status: Former Research scientist (life sciences)  . Smokeless tobacco: Never Used  Substance and Sexual Activity  . Alcohol use: No    Alcohol/week: 0.0 standard drinks  . Drug use: No    Comment: No use of illict drugs.  . Sexual activity: Not Currently  Other Topics Concern  . Not on file  Social  History Narrative   Lives with husband at home.   Social Determinants of Health   Financial Resource Strain:   . Difficulty of Paying Living Expenses:   Food Insecurity:   . Worried About Charity fundraiser in the Last Year:   . Arboriculturist in the Last Year:   Transportation Needs:   . Film/video editor (Medical):   Marland Kitchen Lack of Transportation (Non-Medical):   Physical Activity:   . Days of Exercise per Week:   . Minutes of Exercise per Session:   Stress:   . Feeling of Stress :   Social Connections:   . Frequency of Communication with Friends and Family:   . Frequency of Social Gatherings  with Friends and Family:   . Attends Religious Services:   . Active Member of Clubs or Organizations:   . Attends Archivist Meetings:   Marland Kitchen Marital Status:    Additional Social History:                         Sleep: Good  Appetite:  Fair  Current Medications: Current Facility-Administered Medications  Medication Dose Route Frequency Provider Last Rate Last Admin  . acetaminophen (TYLENOL) tablet 650 mg  650 mg Oral Q6H PRN Caroline Sauger, NP   650 mg at 11/15/19 2129  . albuterol (VENTOLIN HFA) 108 (90 Base) MCG/ACT inhaler 1-2 puff  1-2 puff Inhalation Q6H PRN Sharma Covert, MD      . alum & mag hydroxide-simeth (MAALOX/MYLANTA) 200-200-20 MG/5ML suspension 30 mL  30 mL Oral Q4H PRN Caroline Sauger, NP      . cholecalciferol (VITAMIN D3) tablet 1,000 Units  1,000 Units Oral Daily Sharma Covert, MD   1,000 Units at 11/18/19 [phone]  . ciprofloxacin (CILOXAN) 0.3 % ophthalmic solution 2 drop  2 drop Left Eye Q6H Caroline Sauger, NP   2 drop at 11/18/19 0820  . escitalopram (LEXAPRO) tablet 10 mg  10 mg Oral Daily Sharma Covert, MD   10 mg at 11/18/19 0818  . fluorescein ophthalmic strip 2 strip  2 strip Left Eye Once Caroline Sauger, NP      . gabapentin (NEURONTIN) capsule 100 mg  100 mg Oral TID Sharma Covert, MD   100 mg at 11/18/19 0818  . haloperidol (HALDOL) tablet 5 mg  5 mg Oral Q8H PRN Sharma Covert, MD   5 mg at 11/17/19 2119  . hydrOXYzine (ATARAX/VISTARIL) tablet 50 mg  50 mg Oral Q6H PRN Clapacs, Madie Reno, MD   50 mg at 11/18/19 0819  . lurasidone (LATUDA) tablet 40 mg  40 mg Oral Q breakfast Sharma Covert, MD   40 mg at 11/18/19 0819  . magnesium hydroxide (MILK OF MAGNESIA) suspension 30 mL  30 mL Oral Daily PRN Caroline Sauger, NP      . metoprolol succinate (TOPROL-XL) 24 hr tablet 25 mg  25 mg Oral Daily Sharma Covert, MD   25 mg at 11/17/19 1239  . oxyCODONE (Oxy IR/ROXICODONE) immediate release  tablet 5 mg  5 mg Oral Q6H PRN Clapacs, Madie Reno, MD   5 mg at 11/18/19 0817  . pantoprazole (PROTONIX) EC tablet 40 mg  40 mg Oral Daily Sharma Covert, MD   40 mg at 11/18/19 0819  . polyvinyl alcohol (LIQUIFILM TEARS) 1.4 % ophthalmic solution 1 drop  1 drop Both Eyes PRN Caroline Sauger, NP   1 drop at 11/17/19  2119  . traZODone (DESYREL) tablet 50 mg  50 mg Oral QHS PRN Sharma Covert, MD   50 mg at 11/17/19 2119    Lab Results: No results found for this or any previous visit (from the past 48 hour(s)).  Blood Alcohol level:  Lab Results  Component Value Date   ETH <10 11/14/2019   ETH <5 43/56/8616    Metabolic Disorder Labs: Lab Results  Component Value Date   HGBA1C 5.5 11/14/2019   MPG 111.15 11/14/2019   Lab Results  Component Value Date   PROLACTIN 9.7 07/06/2015   Lab Results  Component Value Date   CHOL 205 (H) 07/06/2015   TRIG 103 07/06/2015   HDL 50 07/06/2015   CHOLHDL 4.1 07/06/2015   VLDL 21 07/06/2015   LDLCALC 134 (H) 07/06/2015    Physical Findings: AIMS: Facial and Oral Movements Muscles of Facial Expression: None, normal Lips and Perioral Area: None, normal Jaw: None, normal Tongue: None, normal,Extremity Movements Upper (arms, wrists, hands, fingers): None, normal Lower (legs, knees, ankles, toes): None, normal, Trunk Movements Neck, shoulders, hips: None, normal, Overall Severity Severity of abnormal movements (highest score from questions above): None, normal Incapacitation due to abnormal movements: None, normal Patient's awareness of abnormal movements (rate only patient's report): No Awareness, Dental Status Current problems with teeth and/or dentures?: No Does patient usually wear dentures?: No  CIWA:    COWS:     Musculoskeletal: Strength & Muscle Tone: within normal limits Gait & Station: normal Patient leans: N/A  Psychiatric Specialty Exam: Physical Exam  Nursing note and vitals reviewed. Constitutional: She is  oriented to person, place, and time.  HENT:  Head: Normocephalic and atraumatic.  Respiratory: Effort normal.  GI: Normal appearance.  Neurological: She is alert and oriented to person, place, and time.    Review of Systems  Blood pressure 133/84, pulse (!) 55, temperature 98.2 F (36.8 C), temperature source Oral, resp. rate 18, height 5' 4"  (1.626 m), weight 74.4 kg, SpO2 100 %.Body mass index is 28.15 kg/m.  General Appearance: Casual  Eye Contact:  Fair  Speech:  Normal Rate  Volume:  Normal  Mood:  Anxious and Dysphoric  Affect:  Congruent  Thought Process:  Coherent and Descriptions of Associations: Loose  Orientation:  Full (Time, Place, and Person)  Thought Content:  Delusions  Suicidal Thoughts:  No  Homicidal Thoughts:  No  Memory:  Immediate;   Poor Recent;   Poor Remote;   Poor  Judgement:  Intact  Insight:  Fair  Psychomotor Activity:  Normal  Concentration:  Concentration: Fair and Attention Span: Fair  Recall:  [employer] of Knowledge:  Fair  Language:  Good  Akathisia:  Negative  Handed:  Right  AIMS (if indicated):     Assets:  Desire for Improvement Resilience  ADL's:  Intact  Cognition:  WNL  Sleep:  Number of Hours: 6.5     Treatment Plan Summary: Daily contact with patient to assess and evaluate symptoms and progress in treatment, Medication management and Plan : Patient is seen and examined.  Patient is a 59 year old female with the above-stated past psychiatric history who is seen in follow-up.  Diagnosis: 1.  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 2.  Degenerative disc disease. 3.  Elevated blood pressure. 4.  It appears that she has a history of diabetes mellitus type 2 in the electronic medical record. 5.  Mildly elevated creatinine 6.  Hyperlipidemia 7.  Vitamin D deficiency 8.  Asthma versus COPD 9.  GERD  Pertinent findings on examination today: 1.  Continued paranoid  delusions. 2.  Improvement in blood pressure. 3.  Still remains anxious.  Plan: 1.  Continue albuterol inhaler 1 to 2 puffs every 6 hours as needed wheezing. 2.  Continue vitamin D 3000 units p.o. daily for nutritional supplementation. 4.  Continue ciprofloxacin ophthalmic solution 2 drops in left eye every 6 hours for ophthalmologic issues. 5.  Continue Lexapro 10 mg p.o. daily for depression and anxiety. 6.  Continue gabapentin 100 mg p.o. 3 times daily for mood stability, anxiety and chronic pain. 7.  Continue Haldol 5 mg p.o. every 8 hours as needed agitation. 8.  Continue hydroxyzine 50 mg p.o. every 6 hours as needed anxiety. 9.  Increase Latuda to 80 mg p.o. daily with breakfast for psychosis. 10.  Continue metoprolol XL 25 mg p.o. daily for hypertension. 11.  Stop oxycodone. 12.  Continue Protonix 40 mg p.o. daily for nutritional supplementation. 13.  Continue polyvinyl alcohol liquid tears 1.4% ophthalmic solution 1 drop in both eyes but changed to standing versus as needed. 14.  Continue trazodone 50 mg p.o. nightly as needed insomnia. 15. disposition planning-in progress. Sharma Covert, MD 11/18/2019, 11:16 AM

## 2019-11-18 NOTE — Progress Notes (Signed)
Pt is alert and oriented to person, place, time and situation. Pt's affect is flat, makes good eye contact, pt is calm, cooperative, denies suicidal and homicidal ideation, denies hallucinations, denies feelings of depression and anxiety. Pt's affect is flat, pt is out for meals, is medication complaint, requests and was given PRN pain medications, see MAR for those details. Pt is pleasant, spends time talking on the telephone. No distress noted, none reported.

## 2019-11-19 MED ORDER — PANTOPRAZOLE SODIUM 40 MG PO TBEC: 40.0000 mg | DELAYED_RELEASE_TABLET | Freq: Every day | ORAL | 1 refills | Status: DC

## 2019-11-19 MED ORDER — VITAMIN D3 25 MCG PO TABS: 1000.0000 [IU] | ORAL_TABLET | Freq: Every day | ORAL | 1 refills | Status: AC

## 2019-11-19 MED ORDER — CIPROFLOXACIN HCL 0.3 % OP SOLN: 2.0000 [drp] | Freq: Four times a day (QID) | OPHTHALMIC | 0 refills | Status: DC

## 2019-11-19 MED ORDER — LURASIDONE HCL 80 MG PO TABS: 80.0000 mg | ORAL_TABLET | Freq: Every day | ORAL | 1 refills | Status: DC

## 2019-11-19 MED ORDER — GABAPENTIN 100 MG PO CAPS: 100.0000 mg | ORAL_CAPSULE | Freq: Three times a day (TID) | ORAL | 1 refills | Status: DC

## 2019-11-19 MED ORDER — HYDROXYZINE HCL 50 MG PO TABS: 50.0000 mg | ORAL_TABLET | Freq: Four times a day (QID) | ORAL | 1 refills | Status: DC | PRN

## 2019-11-19 MED ORDER — METOPROLOL SUCCINATE ER 25 MG PO TB24: 25.0000 mg | ORAL_TABLET | Freq: Every day | ORAL | 1 refills | Status: DC

## 2019-11-19 MED ORDER — POLYVINYL ALCOHOL 1.4 % OP SOLN: 1.0000 [drp] | Freq: Three times a day (TID) | OPHTHALMIC | 1 refills | Status: DC

## 2019-11-19 MED ORDER — FLUTICASONE FUROATE-VILANTEROL 100-25 MCG/INH IN AEPB: 1.0000 | INHALATION_SPRAY | Freq: Every day | RESPIRATORY_TRACT | 1 refills | Status: DC

## 2019-11-19 MED ORDER — FLUTICASONE PROPIONATE 50 MCG/ACT NA SUSP: 2.0000 | Freq: Every day | NASAL | 1 refills | Status: DC

## 2019-11-19 MED ORDER — ALBUTEROL SULFATE HFA 108 (90 BASE) MCG/ACT IN AERS: 1.0000 | INHALATION_SPRAY | Freq: Four times a day (QID) | RESPIRATORY_TRACT | 1 refills | Status: DC | PRN

## 2019-11-19 MED ORDER — TRAZODONE HCL 50 MG PO TABS: 50.0000 mg | ORAL_TABLET | Freq: Every evening | ORAL | 1 refills | Status: DC | PRN

## 2019-11-19 MED ORDER — ESCITALOPRAM OXALATE 10 MG PO TABS: 10.0000 mg | ORAL_TABLET | Freq: Every day | ORAL | 1 refills | Status: DC

## 2019-11-19 NOTE — Plan of Care (Signed)
  Problem: Education: Goal: Knowledge of [place] Education information/materials will improve Outcome: Progressing Goal: Emotional status will improve Outcome: Progressing Goal: Mental status will improve Outcome: Progressing Goal: Verbalization of understanding the information provided will improve Outcome: Progressing   Problem: Activity: Goal: Interest or engagement in activities will improve Outcome: Progressing Goal: Sleeping patterns will improve Outcome: Progressing

## 2019-11-19 NOTE — Progress Notes (Signed)
Patient denies SI/HI, denies A/V hallucinations. Patient verbalizes understanding of discharge instructions, follow up care and prescriptions. Patient given all belongings from [facility] locker. Patient escorted out by staff, transported by family.

## 2019-11-19 NOTE — BHH Counselor (Signed)
Pt is scheduled for discharge today. CSW met with pt and informed her on 11/16/19 her therapist, Dagmar Hait suggested that she be referred for a higher level of care and in person visits. During progression, physician suggested pt be referred for PHP. CSW met with pt to discuss this suggestion and the pt states being agreeable to participating in [facility]. Referral has been made and appointment scheduled.

## 2019-11-19 NOTE — B[facility] Suicide Risk Assessment (Signed)
[facility] INPATIENT:  Family/Significant Other Suicide Prevention Education  Suicide Prevention Education:  Education Completed; Victoria [name], husband [phone] has been identified by the patient as the family member/significant other with whom the patient will be residing, and identified as the person(s) who will aid the patient in the event of a mental health crisis (suicidal ideations/suicide attempt).  With written consent from the patient, the family member/significant other has been provided the following suicide prevention education, prior to the and/or following the discharge of the patient.  The suicide prevention education provided includes the following:  Suicide risk factors  Suicide prevention and interventions  National Suicide Hotline telephone number  [facility] assessment telephone number  [facility] Emergency Assistance [place] and/or Residential Mobile Crisis Unit telephone number  Request made of family/significant other to:  Remove weapons (e.g., guns, rifles, knives), all items previously/currently identified as safety concern.    Remove drugs/medications (over-the-counter, prescriptions, illicit drugs), all items previously/currently identified as a safety concern.  The family member/significant other verbalizes understanding of the suicide prevention education information provided.  The family member/significant other agrees to remove the items of safety concern listed above. [name] reports the pt was admitted into the hospital for worsening anxiety and suicidal ideation. He states the pt has had a hx of SI and anxiety "on and off for 7 years." He denies the pt having access to guns or weapons in the home. He does not report any reservation about the pt returning to the home.  Victoria [name] T Victoria [name] 11/19/2019, 11:05 AM

## 2019-11-19 NOTE — Progress Notes (Signed)
Recreation Therapy Notes  INPATIENT RECREATION TR PLAN  Patient Details Name: Victoria [name] MRN: [id number] DOB: [birth year] Today's Date: 11/19/2019  Rec Therapy Plan Is patient appropriate for Therapeutic Recreation?: Yes Treatment times per week: At least 3 Estimated Length of Stay: 5-7 days TR Treatment/Interventions: Group participation (Comment)  Discharge Criteria Pt will be discharged from therapy if:: Discharged Treatment plan/goals/alternatives discussed and agreed upon by:: Patient/family  Discharge Summary Short term goals set: Patient will identify 3 positive coping skills strategies to use post d/c within 5 recreation therapy group sessions Short term goals met: Adequate for discharge Progress toward goals comments: Groups attended Which groups?: Other (Comment), Goal setting (Happiness) Reason goals not met: N/A Therapeutic equipment acquired: N/A Reason patient discharged from therapy: Discharge from hospital Pt/family agrees with progress & goals achieved: Yes Date patient discharged from therapy: 11/26/19   [name] 11/19/2019, 12:56 PM

## 2019-11-19 NOTE — B[facility] Counselor (Signed)
CSW spoke with Delma Officer at [employer] who reports Dagmar Hait is unavailable and would take a message. CSW informed Delma Officer of the pt discharge plan(PHP 11/20/19) and requested a returned call  once Jan receives the message.

## 2019-11-19 NOTE — Progress Notes (Signed)
Recreation Therapy Notes   Date: 11/19/2019  Time: 9:30 am  Location: Craft room   Behavioral response: Appropriate  Intervention Topic: Goal  Discussion/Intervention:  Group content on today was focused on goals. Patients described what goals are and how they define goals. Individuals expressed how they go about setting goals and reaching them. The group identified how important goals are and if they make short term goals to reach long term goals. Patients described how many goals they work on at a time and what affects them not reaching their goal. Individuals described how much time they put into planning and obtaining their goals. The group participated in the intervention "My Goal Board" and made personal goal boards to help them achieve their goal. Clinical Observations/Feedback:  Patient came to group and explained that she makes her goals based off what she aspires to do. Individual was social with peers and staff while participating on the intervention.  Benigno Check LRT/CTRS         Ayannah Faddis 11/19/2019 12:35 PM

## 2019-11-19 NOTE — Progress Notes (Signed)
  [facility] Adult Case Management Discharge Plan :  Will you be returning to the same living situation after discharge:  Yes,  lives with spouse At discharge, do you have transportation home?: Yes,  spouse will pick up Do you have the ability to pay for your medications: Yes,  [employer]  Release of information consent forms completed and in the chart;  Patient's signature needed at discharge.  Patient to Follow up at:  Follow-up Information    BEHAVIORAL HEALTH PARTIAL HOSPITALIZATION PROGRAM Follow up on 11/26/2019.   Specialty: Behavioral Health Why: You are scheduled for a virtual visit on June 22nd at 12pm with Whitney. Please call [phone] if you have any questions. Thank you. Contact information: [place] [id number] [place] [phone]              Next level of care provider has access to [place] and Suicide Prevention discussed: Yes,  with pt husband  Have you used any form of tobacco in the last 30 days? (Cigarettes, Smokeless Tobacco, Cigars, and/or Pipes): No  Has patient been referred to the Quitline?: N/A patient is not a smoker  Patient has been referred for addiction treatment: N/A  Yvette Rack, LCSW 11/19/2019, 12:50 PM

## 2019-11-19 NOTE — Plan of Care (Signed)
  Problem: Coping Skills Goal: STG - Patient will identify 3 positive coping skills strategies to use post d/c within 5 recreation therapy group sessions Description: STG - Patient will identify 3 positive coping skills strategies to use post d/c within 5 recreation therapy group sessions 11/19/2019 1253 by Ernest Haber, LRT Outcome: Adequate for Discharge 11/19/2019 1253 by Ernest Haber, LRT Outcome: Adequate for Discharge

## 2019-11-19 NOTE — Discharge Summary (Signed)
Physician Discharge Summary Note  Patient:  Victoria [name] is an 59 y.o., female MRN:  [id number] DOB:  [birth year] Patient phone:  [phone] (home)  Patient address:   [place] [address],  Total Time spent with patient: 30 minutes  Date of Admission:  11/14/2019 Date of Discharge: 11/19/2019  Reason for Admission: Patient was admitted because of worsening psychotic symptoms with episodes of suicidal thinking  Principal Problem: Psychosis [facility]) Discharge Diagnoses: Principal Problem:   Psychosis ([place]) Active Problems:   HTN (hypertension)   GERD (gastroesophageal reflux disease)   Asthma   Chronic pain syndrome   Past Psychiatric History: History of longstanding psychotic symptoms and mood instability  Past Medical History:  Past Medical History:  Diagnosis Date  . Asthma   . GERD (gastroesophageal reflux disease)   . Hypercholesterolemia   . Hypertension   . Osteoarthritis of both hips   . Psychotic disorder ([place])   . Sleep apnea     Past Surgical History:  Procedure Laterality Date  . ABDOMINAL HYSTERECTOMY    . BREAST EXCISIONAL BIOPSY Right 1997   neg  . HIATAL HERNIA REPAIR     Jan 2017   Family History:  Family History  Problem Relation Age of Onset  . Heart disease Mother    Family Psychiatric  History: None reported Social History:  Social History   Substance and Sexual Activity  Alcohol Use No  . Alcohol/week: 0.0 standard drinks     Social History   Substance and Sexual Activity  Drug Use No   Comment: No use of illict drugs.    Social History   Socioeconomic History  . Marital status: Married    Spouse name: Not on file  . Number of children: Not on file  . Years of education: Not on file  . Highest education level: Not on file  Occupational History  . Occupation: home maker  Tobacco Use  . Smoking status: Former Research scientist (life sciences)  . Smokeless tobacco: Never Used  Substance and Sexual Activity  . Alcohol use: No     Alcohol/week: 0.0 standard drinks  . Drug use: No    Comment: No use of illict drugs.  . Sexual activity: Not Currently  Other Topics Concern  . Not on file  Social History Narrative   Lives with husband at home.   Social Determinants of Health   Financial Resource Strain:   . Difficulty of Paying Living Expenses:   Food Insecurity:   . Worried About Charity fundraiser in the Last Year:   . Arboriculturist in the Last Year:   Transportation Needs:   . Film/video editor (Medical):   Marland Kitchen Lack of Transportation (Non-Medical):   Physical Activity:   . Days of Exercise per Week:   . Minutes of Exercise per Session:   Stress:   . Feeling of Stress :   Social Connections:   . Frequency of Communication with Friends and Family:   . Frequency of Social Gatherings with Friends and Family:   . Attends Religious Services:   . Active Member of Clubs or Organizations:   . Attends Archivist Meetings:   Marland Kitchen Marital Status:     Hospital Course: Patient was admitted to the psychiatric unit and continued on 15-minute checks.  She displayed no dangerous behavior in the hospital.  She was cooperative with treatment.  Stayed isolated much of the time but did interact with others appropriately at times as well.  Some changes were made to antipsychotics and medications.  Patient gradually showed improvement and at the time of discharge was not stating any suicidal thoughts at all.  She said she was no longer having any hallucinations and did not feel particularly paranoid.  She still has believes that she is being tormented but does not seem preoccupied by them.  Patient is requesting discharge.  He will be discharged to outpatient treatment with current medication prescriptions provided  Physical Findings: AIMS: Facial and Oral Movements Muscles of Facial Expression: None, normal Lips and Perioral Area: None, normal Jaw: None, normal Tongue: None, normal,Extremity Movements Upper  (arms, wrists, hands, fingers): None, normal Lower (legs, knees, ankles, toes): None, normal, Trunk Movements Neck, shoulders, hips: None, normal, Overall Severity Severity of abnormal movements (highest score from questions above): None, normal Incapacitation due to abnormal movements: None, normal Patient's awareness of abnormal movements (rate only patient's report): No Awareness, Dental Status Current problems with teeth and/or dentures?: No Does patient usually wear dentures?: No  CIWA:    COWS:     Musculoskeletal: Strength & Muscle Tone: within normal limits Gait & Station: normal Patient leans: N/A  Psychiatric Specialty Exam: Physical Exam  Constitutional: She appears well-developed.  HENT:  Head: Normocephalic and atraumatic.  Eyes: Pupils are equal, round, and reactive to light. Conjunctivae are normal.  Cardiovascular: Normal heart sounds.  Respiratory: Effort normal.  GI: Soft.  Musculoskeletal:        General: Normal range of motion.     Cervical back: Normal range of motion.  Neurological: She is alert.  Skin: Skin is warm and dry.  Psychiatric: Her speech is normal. Judgment normal. Her mood appears anxious. She is slowed. Thought content is paranoid. She expresses no homicidal and no suicidal ideation.    Review of Systems  Constitutional: Negative.   HENT: Negative.   Eyes: Negative.   Respiratory: Negative.   Cardiovascular: Negative.   Gastrointestinal: Negative.   Musculoskeletal: Negative.   Skin: Negative.   Neurological: Negative.   Psychiatric/Behavioral: Positive for sleep disturbance. Negative for suicidal ideas. The patient is nervous/anxious.     Blood pressure 133/68, pulse (!) 8, temperature 98.3 F (36.8 C), temperature source Oral, resp. rate 18, height 5' 4"  (1.626 m), weight 74.4 kg, SpO2 100 %.Body mass index is 28.15 kg/m.  General Appearance: Casual  Eye Contact:  Good  Speech:  Clear and Coherent  Volume:  Normal  Mood:   Euthymic  Affect:  Congruent  Thought Process:  Goal Directed  Orientation:  Full (Time, Place, and Person)  Thought Content:  Logical  Suicidal Thoughts:  No  Homicidal Thoughts:  No  Memory:  Immediate;   Fair Recent;   Fair Remote;   Fair  Judgement:  Fair  Insight:  Fair  Psychomotor Activity:  Normal  Concentration:  Concentration: Fair  Recall:  [employer] of Knowledge:  Fair  Language:  Fair  Akathisia:  No  Handed:  Right  AIMS (if indicated):     Assets:  Desire for Improvement Housing Physical Health Resilience Social Support  ADL's:  Intact  Cognition:  WNL  Sleep:  Number of Hours: 9     Have you used any form of tobacco in the last 30 days? (Cigarettes, Smokeless Tobacco, Cigars, and/or Pipes): No  Has this patient used any form of tobacco in the last 30 days? (Cigarettes, Smokeless Tobacco, Cigars, and/or Pipes) Yes, No  Blood Alcohol level:  Lab Results  Component Value Date  ETH <10 11/14/2019   ETH <5 16/03/9603    Metabolic Disorder Labs:  Lab Results  Component Value Date   HGBA1C 5.5 11/14/2019   MPG 111.15 11/14/2019   Lab Results  Component Value Date   PROLACTIN 9.7 07/06/2015   Lab Results  Component Value Date   CHOL 205 (H) 07/06/2015   TRIG 103 07/06/2015   HDL 50 07/06/2015   CHOLHDL 4.1 07/06/2015   VLDL 21 07/06/2015   LDLCALC 134 (H) 07/06/2015    See Psychiatric Specialty Exam and Suicide Risk Assessment completed by Attending Physician prior to discharge.  Discharge destination:  Home  Is patient on multiple antipsychotic therapies at discharge:  No   Has Patient had three or more failed trials of antipsychotic monotherapy by history:  No  Recommended Plan for Multiple Antipsychotic Therapies: NA  Discharge Instructions    Diet - low sodium heart healthy   Complete by: As directed    Increase activity slowly   Complete by: As directed      Allergies as of 11/19/2019      Reactions   Amoxicillin     Aspirin    Codeine    Penicillins    Sulfa Antibiotics       Medication List    STOP taking these medications   Breo Ellipta 200-25 MCG/INH Aepb Generic drug: fluticasone furoate-vilanterol Replaced by: fluticasone furoate-vilanterol 100-25 MCG/INH Aepb   carboxymethylcellulose 0.5 % Soln Commonly known as: REFRESH PLUS   Coenzyme Q10 10 MG capsule   Magnesium Oxide 400 MG Caps   neomycin-bacitracin-polymyxin ophthalmic ointment Commonly known as: NEOSPORIN   neomycin-polymyxin b-dexamethasone 3.5-10000-0.1 Oint Commonly known as: MAXITROL   neomycin-polymyxin b-dexamethasone 3.5-10000-0.1 Susp Commonly known as: MAXITROL   Oxycodone HCl 10 MG Tabs   Pepcid 20 MG tablet Generic drug: famotidine   perphenazine 2 MG tablet Commonly known as: TRILAFON   Systane 0.4-0.3 % Gel ophthalmic gel Generic drug: Polyethyl Glycol-Propyl Glycol   Vitamin B-Complex Tabs   Vitamins A & D 10000-400 units Tabs     TAKE these medications     Indication  albuterol 108 (90 Base) MCG/ACT inhaler Commonly known as: VENTOLIN HFA Inhale 1-2 puffs into the lungs every 6 (six) hours as needed for wheezing or shortness of breath. What changed:   See the new instructions.  Another medication with the same name was removed. Continue taking this medication, and follow the directions you see here.  Indication: Asthma   ciprofloxacin 0.3 % ophthalmic solution Commonly known as: CILOXAN Place 2 drops into the left eye every 6 (six) hours. Administer 1 drop, every 2 hours, while awake, for 2 days. Then 1 drop, every 4 hours, while awake, for the next 5 days.  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 Start taking on: November 20, 2019  Indication: Major Depressive Disorder   fluticasone 50 MCG/ACT nasal spray Commonly known as: FLONASE Place 2 sprays into both nostrils daily.   Indication: Allergic Rhinitis   fluticasone furoate-vilanterol 100-25 MCG/INH Aepb Commonly known as: BREO ELLIPTA Inhale 1 puff into the lungs daily. Start taking on: November 20, 2019 Replaces: [place] Patter 200-25 MCG/INH Aepb  Indication: Asthma   gabapentin 100 MG capsule Commonly known as: NEURONTIN Take 1 capsule (100 mg total) by mouth 3 (three) times daily.  Indication: Fibromyalgia Syndrome   hydrOXYzine 50 MG tablet Commonly known as: ATARAX/VISTARIL  Take 1 tablet (50 mg total) by mouth every 6 (six) hours as needed for anxiety. What changed:   medication strength  how much to take  how to take this  reasons to take this  additional instructions  Indication: Feeling Anxious   lurasidone 80 MG Tabs tablet Commonly known as: LATUDA Take 1 tablet (80 mg total) by mouth daily with breakfast. Start taking on: November 20, 2019  Indication: Major Depressive Disorder, Schizophrenia   metoprolol succinate 25 MG 24 hr tablet Commonly known as: TOPROL-XL Take 1 tablet (25 mg total) by mouth daily. Start taking on: November 20, 2019  Indication: High Blood Pressure Disorder   pantoprazole 40 MG tablet Commonly known as: PROTONIX Take 1 tablet (40 mg total) by mouth daily. Start taking on: November 20, 2019  Indication: Gastroesophageal Reflux Disease   polyvinyl alcohol 1.4 % ophthalmic solution Commonly known as: LIQUIFILM TEARS Place 1 drop into both eyes 3 (three) times daily.  Indication: Corneal ulcer   traZODone 50 MG tablet Commonly known as: DESYREL Take 1 tablet (50 mg total) by mouth at bedtime as needed for sleep. What changed:   how much to take  when to take this  reasons to take this  additional instructions  Indication: Trouble Sleeping   Vitamin D3 25 MCG tablet Commonly known as: Vitamin D Take 1 tablet (1,000 Units total) by mouth daily. Start taking on: November 20, 2019  Indication: Osteoporosis       Follow-up [place]. Go on 11/22/2019.   Why: You are scheduled to meet with Dagmar Hait on Thursday, June 24th at [facility]. Please call and confirm this appointment prior to the appointment date. Thank you. Contact information: [address] [place], [place] [address] [phone] [phone]       BEHAVIORAL HEALTH PARTIAL HOSPITALIZATION PROGRAM Follow up on 11/26/2019.   Specialty: Behavioral Health Why: You are scheduled for a virtual visit on June 22nd at 12pm with Whitney. Please call [phone] if you have any questions. Thank you. Contact information: [place] [id number] [place] [phone]              Follow-up recommendations:  Activity:  Activity as tolerated Diet:  Regular diet Other:  Follow-up with outpatient treatment  Comments: Prescriptions provided at discharge  Signed: Alethia Berthold, MD 11/19/2019, 12:07 PM

## 2019-11-19 NOTE — BHH Counselor (Signed)
CSW received a vm from Jan Cheek stating she is in agreement with the recommendation of PHP for the pt.

## 2019-11-19 NOTE — BHH Suicide Risk Assessment (Signed)
[facility] Discharge Suicide Risk Assessment   Principal Problem: Psychosis [facility]) Discharge Diagnoses: Principal Problem:   Psychosis ([place]) Active Problems:   HTN (hypertension)   GERD (gastroesophageal reflux disease)   Asthma   Chronic pain syndrome   Total Time spent with patient: 30 minutes  Musculoskeletal: Strength & Muscle Tone: within normal limits Gait & Station: normal Patient leans: N/A  Psychiatric Specialty Exam: Review of Systems  Constitutional: Negative.   HENT: Negative.   Eyes: Negative.   Respiratory: Negative.   Cardiovascular: Negative.   Gastrointestinal: Negative.   Musculoskeletal: Negative.   Skin: Negative.   Neurological: Negative.   Psychiatric/Behavioral: Positive for sleep disturbance. Negative for behavioral problems, confusion, dysphoric mood, hallucinations, self-injury and suicidal ideas. The patient is nervous/anxious.     Blood pressure 133/68, pulse (!) 8, temperature 98.3 F (36.8 C), temperature source Oral, resp. rate 18, height 5' 4"  (1.626 m), weight 74.4 kg, SpO2 100 %.Body mass index is 28.15 kg/m.  General Appearance: Casual  Eye Contact::  Good  Speech:  Clear and Coherent409  Volume:  Normal  Mood:  Anxious  Affect:  Congruent  Thought Process:  Coherent  Orientation:  Full (Time, Place, and Person)  Thought Content:  Delusions  Suicidal Thoughts:  No  Homicidal Thoughts:  No  Memory:  Immediate;   Fair Recent;   Fair Remote;   Fair  Judgement:  Fair  Insight:  Shallow  Psychomotor Activity:  Normal  Concentration:  Fair  Recall:  [place]  Language: Fair  Akathisia:  No  Handed:  Right  AIMS (if indicated):     Assets:  Desire for Improvement Housing Physical Health Resilience Social Support  Sleep:  Number of Hours: 9  Cognition: WNL  ADL's:  Intact   Mental Status Per Nursing Assessment::   On Admission:  Suicidal ideation indicated by patient  Demographic Factors:  NA  Loss  Factors: NA  Historical Factors: NA  Risk Reduction Factors:   Sense of responsibility to family, Living with another person, especially a relative, Positive social support and Positive therapeutic relationship  Continued Clinical Symptoms:  Schizophrenia:   Paranoid or undifferentiated type  Cognitive Features That Contribute To Risk:  None    Suicide Risk:  Minimal: No identifiable suicidal ideation.  Patients presenting with no risk factors but with morbid ruminations; may be classified as minimal risk based on the severity of the depressive symptoms   Follow-up [place]. Go on 11/22/2019.   Why: You are scheduled to meet with Dagmar Hait on Thursday, June 24th at [facility] Please call and confirm this appointment prior to the appointment date. Thank you. Contact information: [address] [place], [place] [address] [phone] [phone]       BEHAVIORAL HEALTH PARTIAL HOSPITALIZATION PROGRAM Follow up on 11/26/2019.   Specialty: Behavioral Health Why: You are scheduled for a virtual visit on June 22nd at 12pm with Whitney. Please call [phone] if you have any questions. Thank you. Contact information: [place] [id number] [place] [phone]              Plan Of Care/Follow-up recommendations:  Activity:  as tolerated Diet:  regular Other:  follow up with outpatient treatment  Alethia Berthold, MD 11/19/2019, 11:55 AM

## 2019-11-19 NOTE — Progress Notes (Signed)
Patient has been pleasant and cooperative. Denies SI HI and AVH

## 2019-11-19 NOTE — BHH Group Notes (Signed)
[facility] Group Notes:  (Nursing/MHT/Case Management/Adjunct)  Date:  11/19/2019  Time:  10:41 AM  Type of Therapy:  COMMUNITY MEETING  Participation Level:  Did Not Attend  Summary of Progress/Problems:  Charna Busman 11/19/2019, 10:41 AM

## 2019-11-20 ENCOUNTER — Other Ambulatory Visit (HOSPITAL_COMMUNITY): Payer: Medicare HMO

## 2019-11-22 ENCOUNTER — Telehealth (HOSPITAL_COMMUNITY): Payer: Self-pay | Admitting: Professional

## 2019-11-22 ENCOUNTER — Other Ambulatory Visit: Payer: Self-pay

## 2019-11-22 ENCOUNTER — Other Ambulatory Visit (HOSPITAL_COMMUNITY): Payer: Medicare HMO | Attending: Psychiatry | Admitting: Professional

## 2019-11-22 DIAGNOSIS — F333 Major depressive disorder, recurrent, severe with psychotic symptoms: Secondary | ICD-10-CM

## 2019-11-26 ENCOUNTER — Ambulatory Visit (HOSPITAL_COMMUNITY): Payer: Medicare HMO | Admitting: Professional

## 2019-12-12 ENCOUNTER — Other Ambulatory Visit: Payer: Self-pay | Admitting: Psychiatry

## 2019-12-12 DIAGNOSIS — D510 Vitamin B12 deficiency anemia due to intrinsic factor deficiency: Secondary | ICD-10-CM | POA: Insufficient documentation

## 2019-12-12 DIAGNOSIS — K589 Irritable bowel syndrome without diarrhea: Secondary | ICD-10-CM | POA: Insufficient documentation

## 2019-12-12 DIAGNOSIS — M8580 Other specified disorders of bone density and structure, unspecified site: Secondary | ICD-10-CM | POA: Insufficient documentation

## 2019-12-12 DIAGNOSIS — S069XAA Unspecified intracranial injury with loss of consciousness status unknown, initial encounter: Secondary | ICD-10-CM | POA: Insufficient documentation

## 2019-12-12 DIAGNOSIS — K7581 Nonalcoholic steatohepatitis (NASH): Secondary | ICD-10-CM | POA: Insufficient documentation

## 2019-12-12 DIAGNOSIS — R569 Unspecified convulsions: Secondary | ICD-10-CM | POA: Insufficient documentation

## 2019-12-12 DIAGNOSIS — R7303 Prediabetes: Secondary | ICD-10-CM | POA: Insufficient documentation

## 2019-12-12 DIAGNOSIS — J8410 Pulmonary fibrosis, unspecified: Secondary | ICD-10-CM | POA: Insufficient documentation

## 2020-01-07 DIAGNOSIS — Z85820 Personal history of malignant melanoma of skin: Secondary | ICD-10-CM | POA: Insufficient documentation

## 2020-01-07 DIAGNOSIS — R5382 Chronic fatigue, unspecified: Secondary | ICD-10-CM | POA: Insufficient documentation

## 2020-01-07 DIAGNOSIS — N809 Endometriosis, unspecified: Secondary | ICD-10-CM | POA: Insufficient documentation

## 2020-01-07 DIAGNOSIS — E8809 Other disorders of plasma-protein metabolism, not elsewhere classified: Secondary | ICD-10-CM | POA: Insufficient documentation

## 2020-02-07 DIAGNOSIS — E6609 Other obesity due to excess calories: Secondary | ICD-10-CM | POA: Insufficient documentation

## 2020-04-09 DIAGNOSIS — N952 Postmenopausal atrophic vaginitis: Secondary | ICD-10-CM | POA: Insufficient documentation

## 2020-04-09 DIAGNOSIS — I471 Supraventricular tachycardia: Secondary | ICD-10-CM | POA: Insufficient documentation

## 2020-10-13 DIAGNOSIS — D0361 Melanoma in situ of right upper limb, including shoulder: Secondary | ICD-10-CM | POA: Diagnosis not present

## 2020-10-13 DIAGNOSIS — L570 Actinic keratosis: Secondary | ICD-10-CM | POA: Diagnosis not present

## 2020-10-13 DIAGNOSIS — L814 Other melanin hyperpigmentation: Secondary | ICD-10-CM | POA: Diagnosis not present

## 2020-10-13 DIAGNOSIS — D225 Melanocytic nevi of trunk: Secondary | ICD-10-CM | POA: Diagnosis not present

## 2020-10-13 DIAGNOSIS — L821 Other seborrheic keratosis: Secondary | ICD-10-CM | POA: Diagnosis not present

## 2020-10-13 DIAGNOSIS — L218 Other seborrheic dermatitis: Secondary | ICD-10-CM | POA: Diagnosis not present

## 2020-11-14 DIAGNOSIS — F341 Dysthymic disorder: Secondary | ICD-10-CM | POA: Diagnosis not present

## 2020-11-14 DIAGNOSIS — F333 Major depressive disorder, recurrent, severe with psychotic symptoms: Secondary | ICD-10-CM | POA: Diagnosis not present

## 2020-11-14 DIAGNOSIS — F411 Generalized anxiety disorder: Secondary | ICD-10-CM | POA: Diagnosis not present

## 2020-11-22 ENCOUNTER — Other Ambulatory Visit: Payer: Self-pay | Admitting: Psychiatry

## 2020-11-25 DIAGNOSIS — N952 Postmenopausal atrophic vaginitis: Secondary | ICD-10-CM | POA: Diagnosis not present

## 2020-11-25 DIAGNOSIS — N760 Acute vaginitis: Secondary | ICD-10-CM | POA: Diagnosis not present

## 2020-12-11 DIAGNOSIS — Z760 Encounter for issue of repeat prescription: Secondary | ICD-10-CM | POA: Diagnosis not present

## 2020-12-11 DIAGNOSIS — M4726 Other spondylosis with radiculopathy, lumbar region: Secondary | ICD-10-CM | POA: Diagnosis not present

## 2020-12-11 DIAGNOSIS — G8929 Other chronic pain: Secondary | ICD-10-CM | POA: Diagnosis not present

## 2020-12-11 DIAGNOSIS — Z79891 Long term (current) use of opiate analgesic: Secondary | ICD-10-CM | POA: Diagnosis not present

## 2020-12-29 DIAGNOSIS — R12 Heartburn: Secondary | ICD-10-CM | POA: Diagnosis not present

## 2020-12-29 DIAGNOSIS — K5900 Constipation, unspecified: Secondary | ICD-10-CM | POA: Diagnosis not present

## 2020-12-29 DIAGNOSIS — K449 Diaphragmatic hernia without obstruction or gangrene: Secondary | ICD-10-CM | POA: Diagnosis not present

## 2020-12-29 DIAGNOSIS — R109 Unspecified abdominal pain: Secondary | ICD-10-CM | POA: Diagnosis not present

## 2020-12-31 DIAGNOSIS — R5383 Other fatigue: Secondary | ICD-10-CM | POA: Diagnosis not present

## 2020-12-31 DIAGNOSIS — Z79891 Long term (current) use of opiate analgesic: Secondary | ICD-10-CM | POA: Diagnosis not present

## 2020-12-31 DIAGNOSIS — R946 Abnormal results of thyroid function studies: Secondary | ICD-10-CM | POA: Diagnosis not present

## 2020-12-31 DIAGNOSIS — E669 Obesity, unspecified: Secondary | ICD-10-CM | POA: Diagnosis not present

## 2021-01-06 DIAGNOSIS — Z9889 Other specified postprocedural states: Secondary | ICD-10-CM | POA: Diagnosis not present

## 2021-01-06 DIAGNOSIS — M5011 Cervical disc disorder with radiculopathy,  high cervical region: Secondary | ICD-10-CM | POA: Diagnosis not present

## 2021-01-06 DIAGNOSIS — M4726 Other spondylosis with radiculopathy, lumbar region: Secondary | ICD-10-CM | POA: Diagnosis not present

## 2021-01-06 DIAGNOSIS — Z760 Encounter for issue of repeat prescription: Secondary | ICD-10-CM | POA: Diagnosis not present

## 2021-01-06 DIAGNOSIS — Z79891 Long term (current) use of opiate analgesic: Secondary | ICD-10-CM | POA: Diagnosis not present

## 2021-01-06 DIAGNOSIS — G8929 Other chronic pain: Secondary | ICD-10-CM | POA: Diagnosis not present

## 2021-01-09 DIAGNOSIS — F341 Dysthymic disorder: Secondary | ICD-10-CM | POA: Diagnosis not present

## 2021-01-09 DIAGNOSIS — F333 Major depressive disorder, recurrent, severe with psychotic symptoms: Secondary | ICD-10-CM | POA: Diagnosis not present

## 2021-01-09 DIAGNOSIS — F900 Attention-deficit hyperactivity disorder, predominantly inattentive type: Secondary | ICD-10-CM | POA: Diagnosis not present

## 2021-01-09 DIAGNOSIS — F411 Generalized anxiety disorder: Secondary | ICD-10-CM | POA: Diagnosis not present

## 2021-01-15 DIAGNOSIS — Z7689 Persons encountering health services in other specified circumstances: Secondary | ICD-10-CM | POA: Diagnosis not present

## 2021-01-15 DIAGNOSIS — J8410 Pulmonary fibrosis, unspecified: Secondary | ICD-10-CM | POA: Diagnosis not present

## 2021-01-15 DIAGNOSIS — Z23 Encounter for immunization: Secondary | ICD-10-CM | POA: Diagnosis not present

## 2021-01-15 DIAGNOSIS — K589 Irritable bowel syndrome without diarrhea: Secondary | ICD-10-CM | POA: Diagnosis not present

## 2021-01-15 DIAGNOSIS — I10 Essential (primary) hypertension: Secondary | ICD-10-CM | POA: Diagnosis not present

## 2021-01-15 DIAGNOSIS — S069X9S Unspecified intracranial injury with loss of consciousness of unspecified duration, sequela: Secondary | ICD-10-CM | POA: Diagnosis not present

## 2021-01-15 DIAGNOSIS — M160 Bilateral primary osteoarthritis of hip: Secondary | ICD-10-CM | POA: Diagnosis not present

## 2021-01-15 DIAGNOSIS — R569 Unspecified convulsions: Secondary | ICD-10-CM | POA: Diagnosis not present

## 2021-01-15 DIAGNOSIS — Z1231 Encounter for screening mammogram for malignant neoplasm of breast: Secondary | ICD-10-CM | POA: Diagnosis not present

## 2021-01-15 DIAGNOSIS — M461 Sacroiliitis, not elsewhere classified: Secondary | ICD-10-CM | POA: Diagnosis not present

## 2021-01-15 DIAGNOSIS — B351 Tinea unguium: Secondary | ICD-10-CM | POA: Diagnosis not present

## 2021-01-15 DIAGNOSIS — I471 Supraventricular tachycardia: Secondary | ICD-10-CM | POA: Diagnosis not present

## 2021-01-28 DIAGNOSIS — K760 Fatty (change of) liver, not elsewhere classified: Secondary | ICD-10-CM | POA: Diagnosis not present

## 2021-01-28 DIAGNOSIS — R109 Unspecified abdominal pain: Secondary | ICD-10-CM | POA: Diagnosis not present

## 2021-02-10 DIAGNOSIS — G40909 Epilepsy, unspecified, not intractable, without status epilepticus: Secondary | ICD-10-CM | POA: Diagnosis not present

## 2021-02-10 DIAGNOSIS — F411 Generalized anxiety disorder: Secondary | ICD-10-CM | POA: Diagnosis not present

## 2021-02-10 DIAGNOSIS — E785 Hyperlipidemia, unspecified: Secondary | ICD-10-CM | POA: Diagnosis not present

## 2021-02-10 DIAGNOSIS — I509 Heart failure, unspecified: Secondary | ICD-10-CM | POA: Diagnosis not present

## 2021-02-10 DIAGNOSIS — G4700 Insomnia, unspecified: Secondary | ICD-10-CM | POA: Diagnosis not present

## 2021-02-10 DIAGNOSIS — M461 Sacroiliitis, not elsewhere classified: Secondary | ICD-10-CM | POA: Diagnosis not present

## 2021-02-10 DIAGNOSIS — F319 Bipolar disorder, unspecified: Secondary | ICD-10-CM | POA: Diagnosis not present

## 2021-02-10 DIAGNOSIS — Z008 Encounter for other general examination: Secondary | ICD-10-CM | POA: Diagnosis not present

## 2021-02-10 DIAGNOSIS — I471 Supraventricular tachycardia: Secondary | ICD-10-CM | POA: Diagnosis not present

## 2021-02-10 DIAGNOSIS — J449 Chronic obstructive pulmonary disease, unspecified: Secondary | ICD-10-CM | POA: Diagnosis not present

## 2021-02-10 DIAGNOSIS — I110 Hypertensive heart disease with heart failure: Secondary | ICD-10-CM | POA: Diagnosis not present

## 2021-02-10 DIAGNOSIS — E669 Obesity, unspecified: Secondary | ICD-10-CM | POA: Diagnosis not present

## 2021-02-10 DIAGNOSIS — F909 Attention-deficit hyperactivity disorder, unspecified type: Secondary | ICD-10-CM | POA: Diagnosis not present

## 2021-02-19 DIAGNOSIS — F411 Generalized anxiety disorder: Secondary | ICD-10-CM | POA: Diagnosis not present

## 2021-02-19 DIAGNOSIS — F333 Major depressive disorder, recurrent, severe with psychotic symptoms: Secondary | ICD-10-CM | POA: Diagnosis not present

## 2021-02-19 DIAGNOSIS — F900 Attention-deficit hyperactivity disorder, predominantly inattentive type: Secondary | ICD-10-CM | POA: Diagnosis not present

## 2021-02-19 DIAGNOSIS — F341 Dysthymic disorder: Secondary | ICD-10-CM | POA: Diagnosis not present

## 2021-02-19 LAB — HM MAMMOGRAPHY

## 2021-02-20 DIAGNOSIS — Z1231 Encounter for screening mammogram for malignant neoplasm of breast: Secondary | ICD-10-CM | POA: Diagnosis not present

## 2021-03-26 DIAGNOSIS — R079 Chest pain, unspecified: Secondary | ICD-10-CM | POA: Diagnosis not present

## 2021-04-02 DIAGNOSIS — R079 Chest pain, unspecified: Secondary | ICD-10-CM | POA: Diagnosis not present

## 2021-04-02 DIAGNOSIS — R002 Palpitations: Secondary | ICD-10-CM | POA: Diagnosis not present

## 2021-04-06 DIAGNOSIS — M5442 Lumbago with sciatica, left side: Secondary | ICD-10-CM | POA: Diagnosis not present

## 2021-04-06 DIAGNOSIS — Z79891 Long term (current) use of opiate analgesic: Secondary | ICD-10-CM | POA: Insufficient documentation

## 2021-04-06 DIAGNOSIS — M5412 Radiculopathy, cervical region: Secondary | ICD-10-CM | POA: Diagnosis not present

## 2021-04-06 DIAGNOSIS — M5441 Lumbago with sciatica, right side: Secondary | ICD-10-CM | POA: Diagnosis not present

## 2021-04-06 DIAGNOSIS — Z760 Encounter for issue of repeat prescription: Secondary | ICD-10-CM | POA: Insufficient documentation

## 2021-04-06 DIAGNOSIS — G8929 Other chronic pain: Secondary | ICD-10-CM | POA: Diagnosis not present

## 2021-04-30 DIAGNOSIS — F411 Generalized anxiety disorder: Secondary | ICD-10-CM | POA: Diagnosis not present

## 2021-04-30 DIAGNOSIS — F900 Attention-deficit hyperactivity disorder, predominantly inattentive type: Secondary | ICD-10-CM | POA: Diagnosis not present

## 2021-04-30 DIAGNOSIS — F341 Dysthymic disorder: Secondary | ICD-10-CM | POA: Diagnosis not present

## 2021-04-30 DIAGNOSIS — F333 Major depressive disorder, recurrent, severe with psychotic symptoms: Secondary | ICD-10-CM | POA: Diagnosis not present

## 2021-05-06 DIAGNOSIS — R109 Unspecified abdominal pain: Secondary | ICD-10-CM | POA: Diagnosis not present

## 2021-05-06 DIAGNOSIS — R110 Nausea: Secondary | ICD-10-CM | POA: Diagnosis not present

## 2021-05-06 DIAGNOSIS — K5900 Constipation, unspecified: Secondary | ICD-10-CM | POA: Diagnosis not present

## 2021-05-26 DIAGNOSIS — Z760 Encounter for issue of repeat prescription: Secondary | ICD-10-CM | POA: Diagnosis not present

## 2021-05-26 DIAGNOSIS — M5442 Lumbago with sciatica, left side: Secondary | ICD-10-CM | POA: Diagnosis not present

## 2021-05-26 DIAGNOSIS — Z79891 Long term (current) use of opiate analgesic: Secondary | ICD-10-CM | POA: Diagnosis not present

## 2021-05-26 DIAGNOSIS — M5412 Radiculopathy, cervical region: Secondary | ICD-10-CM | POA: Diagnosis not present

## 2021-05-26 DIAGNOSIS — M5441 Lumbago with sciatica, right side: Secondary | ICD-10-CM | POA: Diagnosis not present

## 2021-05-26 DIAGNOSIS — G8929 Other chronic pain: Secondary | ICD-10-CM | POA: Diagnosis not present

## 2021-06-03 DIAGNOSIS — N760 Acute vaginitis: Secondary | ICD-10-CM | POA: Diagnosis not present

## 2021-06-03 DIAGNOSIS — B952 Enterococcus as the cause of diseases classified elsewhere: Secondary | ICD-10-CM | POA: Diagnosis not present

## 2021-06-03 DIAGNOSIS — N952 Postmenopausal atrophic vaginitis: Secondary | ICD-10-CM | POA: Diagnosis not present

## 2021-06-03 DIAGNOSIS — M6289 Other specified disorders of muscle: Secondary | ICD-10-CM | POA: Diagnosis not present

## 2021-06-16 DIAGNOSIS — F900 Attention-deficit hyperactivity disorder, predominantly inattentive type: Secondary | ICD-10-CM | POA: Diagnosis not present

## 2021-06-16 DIAGNOSIS — F333 Major depressive disorder, recurrent, severe with psychotic symptoms: Secondary | ICD-10-CM | POA: Diagnosis not present

## 2021-06-16 DIAGNOSIS — F411 Generalized anxiety disorder: Secondary | ICD-10-CM | POA: Diagnosis not present

## 2021-06-16 DIAGNOSIS — F341 Dysthymic disorder: Secondary | ICD-10-CM | POA: Diagnosis not present

## 2021-06-18 DIAGNOSIS — Z85820 Personal history of malignant melanoma of skin: Secondary | ICD-10-CM | POA: Diagnosis not present

## 2021-06-18 DIAGNOSIS — L218 Other seborrheic dermatitis: Secondary | ICD-10-CM | POA: Diagnosis not present

## 2021-06-18 DIAGNOSIS — L821 Other seborrheic keratosis: Secondary | ICD-10-CM | POA: Diagnosis not present

## 2021-06-18 DIAGNOSIS — L814 Other melanin hyperpigmentation: Secondary | ICD-10-CM | POA: Diagnosis not present

## 2021-07-17 DIAGNOSIS — F333 Major depressive disorder, recurrent, severe with psychotic symptoms: Secondary | ICD-10-CM | POA: Diagnosis not present

## 2021-07-17 DIAGNOSIS — F411 Generalized anxiety disorder: Secondary | ICD-10-CM | POA: Diagnosis not present

## 2021-07-17 DIAGNOSIS — F900 Attention-deficit hyperactivity disorder, predominantly inattentive type: Secondary | ICD-10-CM | POA: Diagnosis not present

## 2021-07-17 DIAGNOSIS — F341 Dysthymic disorder: Secondary | ICD-10-CM | POA: Diagnosis not present

## 2021-07-23 DIAGNOSIS — M5442 Lumbago with sciatica, left side: Secondary | ICD-10-CM | POA: Diagnosis not present

## 2021-07-23 DIAGNOSIS — Z79891 Long term (current) use of opiate analgesic: Secondary | ICD-10-CM | POA: Diagnosis not present

## 2021-07-23 DIAGNOSIS — M5412 Radiculopathy, cervical region: Secondary | ICD-10-CM | POA: Diagnosis not present

## 2021-07-23 DIAGNOSIS — M5441 Lumbago with sciatica, right side: Secondary | ICD-10-CM | POA: Diagnosis not present

## 2021-07-23 DIAGNOSIS — G8929 Other chronic pain: Secondary | ICD-10-CM | POA: Diagnosis not present

## 2021-08-21 DIAGNOSIS — F333 Major depressive disorder, recurrent, severe with psychotic symptoms: Secondary | ICD-10-CM | POA: Diagnosis not present

## 2021-08-21 DIAGNOSIS — F341 Dysthymic disorder: Secondary | ICD-10-CM | POA: Diagnosis not present

## 2021-08-21 DIAGNOSIS — F411 Generalized anxiety disorder: Secondary | ICD-10-CM | POA: Diagnosis not present

## 2021-08-28 DIAGNOSIS — K5903 Drug induced constipation: Secondary | ICD-10-CM | POA: Diagnosis not present

## 2021-08-28 DIAGNOSIS — R1084 Generalized abdominal pain: Secondary | ICD-10-CM | POA: Diagnosis not present

## 2021-08-28 DIAGNOSIS — K219 Gastro-esophageal reflux disease without esophagitis: Secondary | ICD-10-CM | POA: Diagnosis not present

## 2021-08-28 DIAGNOSIS — K449 Diaphragmatic hernia without obstruction or gangrene: Secondary | ICD-10-CM | POA: Diagnosis not present

## 2021-09-07 DIAGNOSIS — R0602 Shortness of breath: Secondary | ICD-10-CM | POA: Diagnosis not present

## 2021-09-07 DIAGNOSIS — J8410 Pulmonary fibrosis, unspecified: Secondary | ICD-10-CM | POA: Diagnosis not present

## 2021-09-07 DIAGNOSIS — J45909 Unspecified asthma, uncomplicated: Secondary | ICD-10-CM | POA: Diagnosis not present

## 2021-09-07 DIAGNOSIS — E559 Vitamin D deficiency, unspecified: Secondary | ICD-10-CM | POA: Diagnosis not present

## 2021-09-07 DIAGNOSIS — G4733 Obstructive sleep apnea (adult) (pediatric): Secondary | ICD-10-CM | POA: Diagnosis not present

## 2021-09-07 DIAGNOSIS — R059 Cough, unspecified: Secondary | ICD-10-CM | POA: Diagnosis not present

## 2021-09-10 DIAGNOSIS — J45909 Unspecified asthma, uncomplicated: Secondary | ICD-10-CM | POA: Diagnosis not present

## 2021-09-25 ENCOUNTER — Other Ambulatory Visit: Payer: Self-pay | Admitting: Neurosurgery

## 2021-09-25 DIAGNOSIS — M47816 Spondylosis without myelopathy or radiculopathy, lumbar region: Secondary | ICD-10-CM | POA: Diagnosis not present

## 2021-09-25 DIAGNOSIS — T402X5A Adverse effect of other opioids, initial encounter: Secondary | ICD-10-CM | POA: Diagnosis not present

## 2021-09-25 DIAGNOSIS — M4316 Spondylolisthesis, lumbar region: Secondary | ICD-10-CM | POA: Diagnosis not present

## 2021-09-25 DIAGNOSIS — M5136 Other intervertebral disc degeneration, lumbar region: Secondary | ICD-10-CM

## 2021-09-25 DIAGNOSIS — M5414 Radiculopathy, thoracic region: Secondary | ICD-10-CM | POA: Diagnosis not present

## 2021-09-25 DIAGNOSIS — M47819 Spondylosis without myelopathy or radiculopathy, site unspecified: Secondary | ICD-10-CM | POA: Diagnosis not present

## 2021-09-25 DIAGNOSIS — K7581 Nonalcoholic steatohepatitis (NASH): Secondary | ICD-10-CM | POA: Diagnosis not present

## 2021-09-25 DIAGNOSIS — M47817 Spondylosis without myelopathy or radiculopathy, lumbosacral region: Secondary | ICD-10-CM | POA: Diagnosis not present

## 2021-09-25 DIAGNOSIS — K5903 Drug induced constipation: Secondary | ICD-10-CM | POA: Diagnosis not present

## 2021-09-25 DIAGNOSIS — M5416 Radiculopathy, lumbar region: Secondary | ICD-10-CM | POA: Diagnosis not present

## 2021-09-25 DIAGNOSIS — R7989 Other specified abnormal findings of blood chemistry: Secondary | ICD-10-CM | POA: Diagnosis not present

## 2021-09-25 DIAGNOSIS — G959 Disease of spinal cord, unspecified: Secondary | ICD-10-CM

## 2021-09-25 DIAGNOSIS — M5412 Radiculopathy, cervical region: Secondary | ICD-10-CM

## 2021-09-25 DIAGNOSIS — M479 Spondylosis, unspecified: Secondary | ICD-10-CM

## 2021-09-28 DIAGNOSIS — M5412 Radiculopathy, cervical region: Secondary | ICD-10-CM | POA: Diagnosis not present

## 2021-09-28 DIAGNOSIS — Z760 Encounter for issue of repeat prescription: Secondary | ICD-10-CM | POA: Diagnosis not present

## 2021-09-28 DIAGNOSIS — Z79891 Long term (current) use of opiate analgesic: Secondary | ICD-10-CM | POA: Diagnosis not present

## 2021-09-28 DIAGNOSIS — G8929 Other chronic pain: Secondary | ICD-10-CM | POA: Diagnosis not present

## 2021-09-29 ENCOUNTER — Encounter: Payer: Self-pay | Admitting: Internal Medicine

## 2021-09-29 ENCOUNTER — Ambulatory Visit (INDEPENDENT_AMBULATORY_CARE_PROVIDER_SITE_OTHER): Payer: Medicare HMO | Admitting: Internal Medicine

## 2021-09-29 VITALS — BP 128/76 | HR 83 | Temp 98.2°F | Resp 16 | Ht 64.25 in | Wt 191.7 lb

## 2021-09-29 DIAGNOSIS — K2100 Gastro-esophageal reflux disease with esophagitis, without bleeding: Secondary | ICD-10-CM

## 2021-09-29 DIAGNOSIS — B351 Tinea unguium: Secondary | ICD-10-CM | POA: Diagnosis not present

## 2021-09-29 DIAGNOSIS — G4733 Obstructive sleep apnea (adult) (pediatric): Secondary | ICD-10-CM | POA: Diagnosis not present

## 2021-09-29 DIAGNOSIS — G894 Chronic pain syndrome: Secondary | ICD-10-CM | POA: Diagnosis not present

## 2021-09-29 DIAGNOSIS — R5383 Other fatigue: Secondary | ICD-10-CM

## 2021-09-29 DIAGNOSIS — Z79891 Long term (current) use of opiate analgesic: Secondary | ICD-10-CM

## 2021-09-29 DIAGNOSIS — K7581 Nonalcoholic steatohepatitis (NASH): Secondary | ICD-10-CM

## 2021-09-29 DIAGNOSIS — E559 Vitamin D deficiency, unspecified: Secondary | ICD-10-CM

## 2021-09-29 DIAGNOSIS — J8410 Pulmonary fibrosis, unspecified: Secondary | ICD-10-CM

## 2021-09-29 DIAGNOSIS — E785 Hyperlipidemia, unspecified: Secondary | ICD-10-CM | POA: Diagnosis not present

## 2021-09-29 DIAGNOSIS — F333 Major depressive disorder, recurrent, severe with psychotic symptoms: Secondary | ICD-10-CM

## 2021-09-29 NOTE — Assessment & Plan Note (Signed)
Chronic.  Recently switched offices, now following at [facility] and undergoing MRI's of the cervical, thoracic and lumbar spines next week.  Continues to follow with pain management through [facility], she is currently on morphine 15 mg 3 times daily and baclofen 10 mg 3 times daily.  She does have emergency Narcan.  Pain management note reviewed from 09/28/2021. ?

## 2021-09-29 NOTE — Assessment & Plan Note (Signed)
Requesting a referral for psychiatry closer to the patient.  She is currently on Lexapro 10 mg but has an extensive psychiatric history.  Referral placed today. ?

## 2021-09-29 NOTE — Assessment & Plan Note (Signed)
Recheck vitamin D level, currently on vitamin D supplements at 1000 IUs daily. ?

## 2021-09-29 NOTE — Progress Notes (Signed)
? ?New Patient Office Visit ? ?Subjective   ? ?Patient ID: Victoria [name], female    DOB: September 14, 1960  Age: 61 y.o. MRN: [id number] ? ?CC:  ?Chief Complaint  ?Patient presents with  ? Establish Care  ? ? ?HPI ?Victoria [name] presents to establish care. She recently moved from [place] and many of her specialists are still there.  ? ?Idiopathic Pulmonology Fibrosis/OSA: ?-First diagnosed in 2021 ?-Following with Pulmonology in [place], currently being worked up for IPF, mother has a history of BOOP; currently on Albuterol inhaler. Recently had labs, planning on CT chest tomorrow, obtaining records. Does endorse shortness of breath but no cough currently ? ?HLD: ?-Medications: None currently, history of statin intolerance - myalgias  ?-Last lipid panel: Lipid Panel  ?   ?Component Value Date/Time  ? CHOL 205 (H) 07/06/2015 1604  ? TRIG 103 07/06/2015 1604  ? HDL 50 07/06/2015 1604  ? CHOLHDL 4.1 07/06/2015 1604  ? VLDL 21 07/06/2015 1604  ? LDLCALC 134 (H) 07/06/2015 1604  ? ?GERD: ?-Currently on Protonix 40 mg ?-Symptoms include reflux, epigastric pain ?-Following with GI - at [place], obtaining records ? ?NASH: ?-Most recent LFTs elevated, having fibroscan soon through GI ? ?Chronic Back Pain: ?-Follows Neurosurgery through [facility] but Psychologist, sport and exercise retired recently - now following at [facility], [place] for MRI of cervical, thoracic, and lumbar imaging next week  ?-Following with [facility] at [facility], who is prescribing Morphine 15 mg TID and Baclofen 10 mg TID. She is planning on follow up with them in 3 months. Note from 09/28/21 reviewed.  ?-Had been on Tramadol in the past but it did not relieve her pain, Gabapentin contraindicated  ?-s/p MILD procedure 11/2019 ?-Last MRI cervical spine was in 10/2017 with foraminal narrowing  ? ?MDD: ?-Follows with Psychiatry but needs new referral ?-Mood status: fluctuating  ?-Current treatment: Lexapro 10 ?-Symptom severity: severe  ?-Duration  of current treatment : chronic ?-Side effects: no ?Medication compliance: excellent compliance ?Previous psychiatric medications: has been on multiple and failed  ? ? ?  09/29/2021  ? 11:15 AM  ?Depression screen PHQ 2/9  ?Decreased Interest 3  ?Down, Depressed, Hopeless 2  ?PHQ - 2 Score 5  ?Altered sleeping 3  ?Tired, decreased energy 3  ?Change in appetite 3  ?Feeling bad or failure about yourself  2  ?Trouble concentrating 3  ?Moving slowly or fidgety/restless 2  ?Suicidal thoughts 2  ?PHQ-9 Score 23  ?Difficult doing work/chores Somewhat difficult  ? ?Vitamin D Deficiency: ?-Uncertain last value ?-Currently on supplements of Vitamin D, 1000 IU daily ? ?Health Maintenance: ?-Blood work due ?-Mammogram 9/22 ?-Pap obtaining records ?-Colon cancer screening: obtaining records ? ?Outpatient Encounter Medications as of 09/29/2021  ?Medication Sig  ? albuterol (VENTOLIN HFA) 108 (90 Base) MCG/ACT inhaler Inhale 1-2 puffs into the lungs every 6 (six) hours as needed for wheezing or shortness of breath.  ? cholecalciferol (VITAMIN D) 25 MCG tablet Take 1 tablet (1,000 Units total) by mouth daily.  ? conjugated estrogens (PREMARIN) vaginal cream Premarin 0.625 mg/gram vaginal cream ? INSERT INTO THE VAGINA EVERY OTHER DAY.  ? escitalopram (LEXAPRO) 10 MG tablet Take 1 tablet (10 mg total) by mouth daily.  ? pantoprazole (PROTONIX) 40 MG tablet Take 1 tablet (40 mg total) by mouth daily.  ? polyvinyl alcohol (LIQUIFILM TEARS) 1.4 % ophthalmic solution Place 1 drop into both eyes 3 (three) times daily.  ? [DISCONTINUED] fluticasone (FLONASE) 50 MCG/ACT nasal spray Place 2 sprays into  both nostrils daily.  ? [DISCONTINUED] fluticasone furoate-vilanterol (BREO ELLIPTA) 100-25 MCG/INH AEPB Inhale 1 puff into the lungs daily.  ? [DISCONTINUED] gabapentin (NEURONTIN) 100 MG capsule Take 1 capsule (100 mg total) by mouth 3 (three) times daily.  ? [DISCONTINUED] hydrOXYzine (ATARAX/VISTARIL) 50 MG tablet Take 1 tablet (50 mg total)  by mouth every 6 (six) hours as needed for anxiety.  ? [DISCONTINUED] lurasidone (LATUDA) 80 MG TABS tablet Take 1 tablet (80 mg total) by mouth daily with breakfast.  ? [DISCONTINUED] metoprolol succinate (TOPROL-XL) 25 MG 24 hr tablet Take 1 tablet (25 mg total) by mouth daily.  ? [DISCONTINUED] traZODone (DESYREL) 50 MG tablet Take 1 tablet (50 mg total) by mouth at bedtime as needed for sleep.  ? baclofen (LIORESAL) 10 MG tablet Take 10 mg by mouth 3 (three) times daily.  ? hydrocortisone 2.5 % cream hydrocortisone 2.5 % topical cream with perineal applicator ? APPLY TO AFFECTED AREA TWICE A DAY  ? ondansetron (ZOFRAN) 4 MG tablet Take 4 mg by mouth 2 (two) times daily.  ? oxyCODONE (ROXICODONE) 15 MG immediate release tablet Take 15 mg by mouth every 8 (eight) hours as needed.  ? [DISCONTINUED] ciprofloxacin (CILOXAN) 0.3 % ophthalmic solution Place 2 drops into the left eye every 6 (six) hours. Administer 1 drop, every 2 hours, while awake, for 2 days. Then 1 drop, every 4 hours, while awake, for the next 5 days. (Patient not taking: Reported on 09/29/2021)  ? ?No facility-administered encounter medications on file as of 09/29/2021.  ? ? ?Past Medical History:  ?Diagnosis Date  ? Asthma   ? GERD (gastroesophageal reflux disease)   ? Hypercholesterolemia   ? Hypertension   ? Osteoarthritis of both hips   ? Psychotic disorder ([place])   ? Sleep apnea   ? ? ?Past Surgical History:  ?Procedure Laterality Date  ? ABDOMINAL HYSTERECTOMY    ? BREAST EXCISIONAL BIOPSY Right 1997  ? neg  ? HIATAL HERNIA REPAIR    ? Jan 2017  ? ? ?Family History  ?Problem Relation Age of Onset  ? Heart disease Mother   ? ? ?Social History  ? ?Socioeconomic History  ? Marital status: Married  ?  Spouse name: Not on file  ? Number of children: Not on file  ? Years of education: Not on file  ? Highest education level: Not on file  ?Occupational History  ? Occupation: home maker  ?Tobacco Use  ? Smoking status: Former  ? Smokeless tobacco: Never   ?Substance and Sexual Activity  ? Alcohol use: No  ?  Alcohol/week: 0.0 standard drinks  ? Drug use: No  ?  Comment: No use of illict drugs.  ? Sexual activity: Not Currently  ?Other Topics Concern  ? Not on file  ?Social History Narrative  ? Lives with husband at home.  ? ?Social Determinants of Health  ? ?Financial Resource Strain: Not on file  ?Food Insecurity: Not on file  ?Transportation Needs: Not on file  ?Physical Activity: Not on file  ?Stress: Not on file  ?Social Connections: Not on file  ?Intimate Partner Violence: Not on file  ? ? ?Review of Systems  ?All other systems reviewed and are negative. ? ?  ? ? ?Objective   ? ?BP 128/76   Pulse 83   Temp 98.2 ?F (36.8 ?C)   Resp 16   Ht 5' 4.25" (1.632 m)   Wt 191 lb 11.2 oz (87 kg)   SpO2 90%   BMI  32.65 kg/m?  ? ?Physical Exam ?Constitutional:   ?   Appearance: Normal appearance.  ?HENT:  ?   Head: Normocephalic and atraumatic.  ?   Mouth/Throat:  ?   Mouth: Mucous membranes are moist.  ?   Pharynx: Oropharynx is clear.  ?Eyes:  ?   Conjunctiva/sclera: Conjunctivae normal.  ?Cardiovascular:  ?   Rate and Rhythm: Normal rate and regular rhythm.  ?Pulmonary:  ?   Effort: Pulmonary effort is normal.  ?   Breath sounds: Normal breath sounds.  ?Abdominal:  ?   General: There is no distension.  ?   Palpations: Abdomen is soft.  ?   Tenderness: There is abdominal tenderness. There is no right CVA tenderness, left CVA tenderness, guarding or rebound.  ?   Comments: Epigastric pain to palpation  ?Musculoskeletal:  ?   Right lower leg: No edema.  ?   Left lower leg: No edema.  ?Skin: ?   General: Skin is warm and dry.  ?Neurological:  ?   General: No focal deficit present.  ?   Mental Status: She is alert. Mental status is at baseline.  ?Psychiatric:     ?   Mood and Affect: Mood normal.     ?   Behavior: Behavior normal.  ? ? ? ?  ? ?Assessment & Plan:  ? ?Problem List Items Addressed This Visit   ? ?  ? Respiratory  ? Fibrosis of lung ([place]) - Primary  ?   Currently undergoing work-up through pulmonology, will obtain records.  Undergoing chest CT tomorrow.  Only on albuterol inhaler currently, denies progressive symptoms. ? ?  ?  ? OSA (obstructive sleep apnea)  ?

## 2021-09-29 NOTE — Assessment & Plan Note (Signed)
Stable. Compliant with CPAP

## 2021-09-29 NOTE — Assessment & Plan Note (Signed)
Controlled on Protonix 40 mg daily.  Following with GI through [facility], will obtain records. ?

## 2021-09-29 NOTE — Assessment & Plan Note (Signed)
Reviewed recent lab work, will get into the chart with mild elevation in both AST and ALT.  Planning on undergoing FibroScan next week through GI.  Will obtain records. ?

## 2021-09-29 NOTE — Assessment & Plan Note (Signed)
Currently undergoing work-up through pulmonology, will obtain records.  Undergoing chest CT tomorrow.  Only on albuterol inhaler currently, denies progressive symptoms. ?

## 2021-09-29 NOTE — Assessment & Plan Note (Signed)
Under the care of pain management, does have Narcan for emergencies. ?

## 2021-09-29 NOTE — Assessment & Plan Note (Signed)
We will obtain updated lipid panel today.  History of statin intolerance secondary to myalgias. ?

## 2021-09-29 NOTE — Patient Instructions (Signed)
It was great seeing you today! ? ?Plan discussed at today's visit: ?-Blood work ordered today, results will be uploaded to [facility].  ?-No changes to medications made today ?-Referral to Psychiatry placed today ?-Referral placed to Podiatry as well  ? ?Follow up in: 2 weeks  ? ?Take care and let us know if you have any questions or concerns prior to your next visit. ? ?Dr. Rosana Berger ? ?

## 2021-09-30 DIAGNOSIS — J8410 Pulmonary fibrosis, unspecified: Secondary | ICD-10-CM | POA: Diagnosis not present

## 2021-09-30 DIAGNOSIS — D869 Sarcoidosis, unspecified: Secondary | ICD-10-CM | POA: Diagnosis not present

## 2021-09-30 LAB — LIPID PANEL
Cholesterol: 254 mg/dL — ABNORMAL HIGH (ref <200)
HDL: 62 mg/dL (ref >=50)
LDL Cholesterol (Calc): 160 mg/dL (calc) — ABNORMAL HIGH
Non-HDL Cholesterol (Calc): 192 mg/dL (calc) — ABNORMAL HIGH (ref <130)
Total CHOL/HDL Ratio: 4.1 (calc) (ref <5.0)
Triglycerides: 172 mg/dL — ABNORMAL HIGH (ref <150)

## 2021-09-30 LAB — VITAMIN B12: Vitamin B-12: 1111 pg/mL — ABNORMAL HIGH (ref 200–1100)

## 2021-09-30 LAB — VITAMIN D 25 HYDROXY (VIT D DEFICIENCY, FRACTURES): Vit D, 25-Hydroxy: 53 ng/mL (ref 30–100)

## 2021-09-30 LAB — TSH: TSH: 1.14 mIU/L (ref 0.40–4.50)

## 2021-10-05 DIAGNOSIS — F411 Generalized anxiety disorder: Secondary | ICD-10-CM | POA: Diagnosis not present

## 2021-10-05 DIAGNOSIS — F333 Major depressive disorder, recurrent, severe with psychotic symptoms: Secondary | ICD-10-CM | POA: Diagnosis not present

## 2021-10-05 DIAGNOSIS — F341 Dysthymic disorder: Secondary | ICD-10-CM | POA: Diagnosis not present

## 2021-10-08 ENCOUNTER — Ambulatory Visit
Admission: RE | Admit: 2021-10-08 | Discharge: 2021-10-08 | Disposition: A | Discharge status: Home / Self Care | Payer: Medicare HMO | Source: Ambulatory Visit | Attending: Neurosurgery | Admitting: Neurosurgery

## 2021-10-08 DIAGNOSIS — M5412 Radiculopathy, cervical region: Secondary | ICD-10-CM

## 2021-10-08 DIAGNOSIS — G959 Disease of spinal cord, unspecified: Secondary | ICD-10-CM

## 2021-10-08 DIAGNOSIS — M5416 Radiculopathy, lumbar region: Secondary | ICD-10-CM | POA: Insufficient documentation

## 2021-10-08 DIAGNOSIS — M40204 Unspecified kyphosis, thoracic region: Secondary | ICD-10-CM | POA: Diagnosis not present

## 2021-10-08 DIAGNOSIS — M47812 Spondylosis without myelopathy or radiculopathy, cervical region: Secondary | ICD-10-CM | POA: Diagnosis not present

## 2021-10-08 DIAGNOSIS — M47819 Spondylosis without myelopathy or radiculopathy, site unspecified: Secondary | ICD-10-CM | POA: Insufficient documentation

## 2021-10-08 DIAGNOSIS — M4802 Spinal stenosis, cervical region: Secondary | ICD-10-CM | POA: Diagnosis not present

## 2021-10-08 DIAGNOSIS — M479 Spondylosis, unspecified: Secondary | ICD-10-CM | POA: Insufficient documentation

## 2021-10-08 DIAGNOSIS — M4316 Spondylolisthesis, lumbar region: Secondary | ICD-10-CM | POA: Diagnosis not present

## 2021-10-08 DIAGNOSIS — M5022 Other cervical disc displacement, mid-cervical region, unspecified level: Secondary | ICD-10-CM | POA: Diagnosis not present

## 2021-10-08 DIAGNOSIS — M5136 Other intervertebral disc degeneration, lumbar region: Secondary | ICD-10-CM | POA: Insufficient documentation

## 2021-10-08 DIAGNOSIS — M5126 Other intervertebral disc displacement, lumbar region: Secondary | ICD-10-CM | POA: Diagnosis not present

## 2021-10-12 DIAGNOSIS — J45909 Unspecified asthma, uncomplicated: Secondary | ICD-10-CM | POA: Diagnosis not present

## 2021-10-13 ENCOUNTER — Ambulatory Visit: Payer: Medicare HMO | Admitting: Internal Medicine

## 2021-10-13 ENCOUNTER — Encounter: Payer: Self-pay | Admitting: Internal Medicine

## 2021-10-13 VITALS — BP 126/78 | HR 91 | Temp 98.5°F | Resp 16 | Ht 64.25 in | Wt 190.1 lb

## 2021-10-13 DIAGNOSIS — R5382 Chronic fatigue, unspecified: Secondary | ICD-10-CM | POA: Diagnosis not present

## 2021-10-13 DIAGNOSIS — G4700 Insomnia, unspecified: Secondary | ICD-10-CM | POA: Diagnosis not present

## 2021-10-13 DIAGNOSIS — K7581 Nonalcoholic steatohepatitis (NASH): Secondary | ICD-10-CM | POA: Diagnosis not present

## 2021-10-13 DIAGNOSIS — E785 Hyperlipidemia, unspecified: Secondary | ICD-10-CM | POA: Diagnosis not present

## 2021-10-13 DIAGNOSIS — J8410 Pulmonary fibrosis, unspecified: Secondary | ICD-10-CM

## 2021-10-13 DIAGNOSIS — Z114 Encounter for screening for human immunodeficiency virus [HIV]: Secondary | ICD-10-CM | POA: Diagnosis not present

## 2021-10-13 DIAGNOSIS — M2550 Pain in unspecified joint: Secondary | ICD-10-CM

## 2021-10-13 NOTE — Patient Instructions (Signed)
It was great seeing you today! ? ?Plan discussed at today's visit: ?-Blood work ordered today, results will be uploaded to [facility].  ?-Obtain blood pressure cuff and start monitoring blood pressure at home  ? ?Follow up in: 1 month  ? ?Take care and let us know if you have any questions or concerns prior to your next visit. ? ?Dr. Rosana Berger ? ?

## 2021-10-13 NOTE — Progress Notes (Signed)
? ?New Patient Office Visit ? ?Subjective   ? ?Patient ID: Victoria [name], female    DOB: [birth year]  Age: 61 y.o. MRN: [id number] ? ?CC:  ?Chief Complaint  ?Patient presents with  ? Follow-up  ? ? ?HPI ?[name] presents to for follow up.  ? ?INSOMNIA ?Duration:  3 months  ?Satisfied with sleep quality: no; wakes up in the middle of the night to throw up, has back pain and numbness and tingling in hands and feet and legs. Some muscle spasms from pain. Does follow with pain management ?Difficulty falling asleep: yes ?Difficulty staying asleep: yes ?Waking a few hours after sleep onset: yes ?Goes to bed at 11 pm and wakes up 7 am; wakes up multiple times in the night due to pain  ?Early morning awakenings: yes ?Daytime hypersomnolence: yes ?Wakes feeling refreshed: no ?Apnea: no ?Snoring: no ?Depressed/anxious mood: yes ?Recent stress: yes ?Restless legs/nocturnal leg cramps: no ?Chronic pain/arthritis: yes ?Treatments attempted: benadryl and ambien; cannot take Ambien due to being on narcotics for pain  ?Sleep study scheduled for 11/27/21 ? ?Autoimmune Concerns: ?-Family history includes: sister with Lupus, daughter and granddaughter with Leslie Andrea, other sister diabetes inspidus, thyroid disease  ?-Dealing with severe pain for years, multiple joints with chronic pain and fatigue  ?-Worked up for autoimmune issues in the past (cannot view labs). States she had a thyroid enzyme that was abnormal in the past but doesn't remember specifics.  ? ?Idiopathic Pulmonology Fibrosis/OSA: ?-First diagnosed in 2021 ?-Following with Pulmonology in [place], currently being worked up for IPF, mother has a history of BOOP; currently on Albuterol inhaler. Most recent chest CT showing resolution of the fibrosis ? Following with Pulmonology next week  ? ?HLD: ?-Medications: None currently, history of statin intolerance - myalgias  ?-Last lipid panel: Lipid Panel  ?   ?Component Value Date/Time  ? CHOL 254 (H) 09/29/2021  1204  ? TRIG 172 (H) 09/29/2021 1204  ? HDL 62 09/29/2021 1204  ? CHOLHDL 4.1 09/29/2021 1204  ? VLDL 21 07/06/2015 1604  ? LDLCALC 160 (H) 09/29/2021 1204  ? ?The 10-year ASCVD risk score (Arnett DK, et al., 2019) is: 4% ?  Values used to calculate the score: ?    Age: 68 years ?    Sex: Female ?    Is Non-Hispanic African American: No ?    Diabetic: No ?    Tobacco smoker: No ?    Systolic Blood Pressure: 382 mmHg ?    Is BP treated: No ?    HDL Cholesterol: 62 mg/dL ?    Total Cholesterol: 254 mg/dL ? ?GERD: ?-Currently on Protonix 40 mg ?-Symptoms include reflux, epigastric pain ?-Following with GI - at [facility], ? ?NASH: ?-Most recent LFTs elevated, having fibroscan in November through GI ? ?Chronic Back Pain: ?-Follows Neurosurgery through [facility] but Psychologist, sport and exercise retired recently - now following at [facility], had MRI's of spine on 10/08/21 ?-Following with [facility] Pain Clinic at [facility], who is prescribing Morphine 15 mg TID and Baclofen 10 mg TID. She is planning on follow up with them in 3 months. Note from 09/28/21 reviewed.  ?-Had been on Tramadol in the past but it did not relieve her pain, Gabapentin contraindicated  ?-s/p MILD procedure 11/2019 ?-Last MRI cervical spine was in 10/2017 with foraminal narrowing  ? ?MDD: ?-Follows with Psychiatry but needs new referral ?-Mood status: fluctuating  ?-Current treatment: Lexapro 10 ?-Symptom severity: severe  ?-Duration of current treatment : chronic ?-Side effects:  no ?Medication compliance: excellent compliance ?Previous psychiatric medications: has been on multiple and failed  ? ? ?  10/13/2021  ? 11:25 AM 09/29/2021  ? 11:15 AM  ?Depression screen PHQ 2/9  ?Decreased Interest 0 3  ?Down, Depressed, Hopeless 1 2  ?PHQ - 2 Score 1 5  ?Altered sleeping 0 3  ?Tired, decreased energy 0 3  ?Change in appetite 0 3  ?Feeling bad or failure about yourself  0 2  ?Trouble concentrating 0 3  ?Moving slowly or fidgety/restless 0 2  ?Suicidal thoughts 0 2   ?PHQ-9 Score 1 23  ?Difficult doing work/chores Not difficult at all Somewhat difficult  ? ?Vitamin D Deficiency: ?-Last value 5/23 good at 30  ?-Currently on supplements of Vitamin D, 1000 IU daily ? ?Health Maintenance: ?-Blood work due ?-Mammogram 9/22 ?-Pap obtaining records ?-Colon cancer screening: obtaining records ? ?Outpatient Encounter Medications as of 10/13/2021  ?Medication Sig  ? albuterol (VENTOLIN HFA) 108 (90 Base) MCG/ACT inhaler Inhale 1-2 puffs into the lungs every 6 (six) hours as needed for wheezing or shortness of breath.  ? baclofen (LIORESAL) 10 MG tablet Take 10 mg by mouth 3 (three) times daily.  ? cholecalciferol (VITAMIN D) 25 MCG tablet Take 1 tablet (1,000 Units total) by mouth daily.  ? conjugated estrogens (PREMARIN) vaginal cream Premarin 0.625 mg/gram vaginal cream ? INSERT INTO THE VAGINA EVERY OTHER DAY.  ? escitalopram (LEXAPRO) 10 MG tablet Take 1 tablet (10 mg total) by mouth daily.  ? hydrocortisone 2.5 % cream hydrocortisone 2.5 % topical cream with perineal applicator ? APPLY TO AFFECTED AREA TWICE A DAY  ? ondansetron (ZOFRAN) 4 MG tablet Take 4 mg by mouth 2 (two) times daily.  ? oxyCODONE (ROXICODONE) 15 MG immediate release tablet Take 15 mg by mouth every 8 (eight) hours as needed.  ? pantoprazole (PROTONIX) 40 MG tablet Take 1 tablet (40 mg total) by mouth daily.  ? polyvinyl alcohol (LIQUIFILM TEARS) 1.4 % ophthalmic solution Place 1 drop into both eyes 3 (three) times daily.  ? ?No facility-administered encounter medications on file as of 10/13/2021.  ? ? ?Past Medical History:  ?Diagnosis Date  ? Asthma   ? Cervical spinal stenosis   ? Fatty liver   ? GERD (gastroesophageal reflux disease)   ? Hypercholesterolemia   ? Hypertension   ? Lumbar stenosis   ? 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 Osteoarthritis of both hips   ? Psychotic disorder ([place])   ? Pulmonary fibrosis ([place])   ? Sleep apnea   ? ? ?Past Surgical History:  ?Procedure Laterality Date  ? ABDOMINAL HYSTERECTOMY     ? APPENDECTOMY    ? BREAST EXCISIONAL BIOPSY Right 1997  ? neg  ? HIATAL HERNIA REPAIR    ? Jan 2017  ? LAPAROSCOPY    ? ? ?Family History  ?Problem Relation Age of Onset  ? Alcohol abuse Mother   ? Arthritis Mother   ? Heart disease Mother   ? Liver disease Mother   ? Osteoporosis Mother   ? Clotting disorder Mother   ? Diabetes Father   ? Arthritis Father   ? Stroke Father   ? Sarcoidosis Father   ? Heart failure Father   ? Diabetes Sister   ? Arthritis Sister   ? Thyroid disease Sister   ? Lupus Sister   ? Ehlers-Danlos syndrome Daughter   ? ? ?Social History  ? ?Socioeconomic History  ? Marital status: Married  ?  Spouse name: Not  on file  ? Number of children: Not on file  ? Years of education: Not on file  ? Highest education level: Not on file  ?Occupational History  ? Occupation: home maker  ?Tobacco Use  ? Smoking status: Former  ? Smokeless tobacco: Never  ?Vaping Use  ? Vaping Use: Never used  ?Substance and Sexual Activity  ? Alcohol use: No  ?  Alcohol/week: 0.0 standard drinks  ? Drug use: No  ?  Comment: No use of illict drugs.  ? Sexual activity: Not Currently  ?Other Topics Concern  ? Not on file  ?Social History Narrative  ? Lives with husband at home.  ? ?Social Determinants of Health  ? ?Financial Resource Strain: Not on file  ?Food Insecurity: Not on file  ?Transportation Needs: Not on file  ?Physical Activity: Not on file  ?Stress: Not on file  ?Social Connections: Not on file  ?Intimate Partner Violence: Not on file  ? ? ?Review of Systems  ?Constitutional:  Positive for malaise/fatigue. Negative for chills and fever.  ?Respiratory:  Negative for cough, shortness of breath and wheezing.   ?Gastrointestinal:  Negative for abdominal pain, heartburn, nausea and vomiting.  ?Musculoskeletal:  Positive for back pain, joint pain and neck pain.  ?Neurological:  Positive for sensory change and weakness.  ?All other systems reviewed and are negative. ? ?  ? ? ?Objective   ? ?BP 126/78   Pulse 91    Temp 98.5 ?F (36.9 ?C)   Resp 16   Ht 5' 4.25" (1.632 m)   Wt 190 lb 1.6 oz (86.2 kg)   SpO2 97%   BMI 32.38 kg/m?  ? ?Physical Exam ?Constitutional:   ?   Appearance: Normal appearance.  ?HENT:  ?   Head: No

## 2021-10-14 LAB — CBC WITH DIFFERENTIAL/PLATELET
Absolute Monocytes: 464 cells/uL (ref 200–950)
Basophils Absolute: 29 cells/uL (ref 0–200)
Basophils Relative: 0.5 %
Eosinophils Absolute: 133 cells/uL (ref 15–500)
Eosinophils Relative: 2.3 %
HCT: 43.1 % (ref 35.0–45.0)
Hemoglobin: 14.3 g/dL (ref 11.7–15.5)
Lymphs Abs: 2204 cells/uL (ref 850–3900)
MCH: 30.8 pg (ref 27.0–33.0)
MCHC: 33.2 g/dL (ref 32.0–36.0)
MCV: 92.7 fL (ref 80.0–100.0)
MPV: 10.8 fL (ref 7.5–12.5)
Monocytes Relative: 8 %
Neutro Abs: 2970 cells/uL (ref 1500–7800)
Neutrophils Relative %: 51.2 %
Platelets: 260 10*3/uL (ref 140–400)
RBC: 4.65 10*6/uL (ref 3.80–5.10)
RDW: 13 % (ref 11.0–15.0)
Total Lymphocyte: 38 %
WBC: 5.8 10*3/uL (ref 3.8–10.8)

## 2021-10-14 LAB — COMPLETE METABOLIC PANEL WITH GFR
AG Ratio: 1.6 (calc) (ref 1.0–2.5)
ALT: 57 U/L — ABNORMAL HIGH (ref 6–29)
AST: 39 U/L — ABNORMAL HIGH (ref 10–35)
Albumin: 4.4 g/dL (ref 3.6–5.1)
Alkaline phosphatase (APISO): 99 U/L (ref 37–153)
BUN: 15 mg/dL (ref 7–25)
CO2: 26 mmol/L (ref 20–32)
Calcium: 9.7 mg/dL (ref 8.6–10.4)
Chloride: 104 mmol/L (ref 98–110)
Creat: 0.87 mg/dL (ref 0.50–1.05)
Globulin: 2.7 g/dL (calc) (ref 1.9–3.7)
Glucose, Bld: 87 mg/dL (ref 65–99)
Potassium: 4.7 mmol/L (ref 3.5–5.3)
Sodium: 140 mmol/L (ref 135–146)
Total Bilirubin: 0.6 mg/dL (ref 0.2–1.2)
Total Protein: 7.1 g/dL (ref 6.1–8.1)
eGFR: 76 mL/min/{1.73_m2} (ref >=60)

## 2021-10-14 LAB — TSH: TSH: 0.98 mIU/L (ref 0.40–4.50)

## 2021-10-14 LAB — T4, FREE: Free T4: 0.9 ng/dL (ref 0.8–1.8)

## 2021-10-14 LAB — SEDIMENTATION RATE: Sed Rate: 17 mm/h (ref 0–30)

## 2021-10-14 LAB — T3, FREE: T3, Free: 3.6 pg/mL (ref 2.3–4.2)

## 2021-10-14 LAB — ALPHA-1-ANTITRYPSIN: A-1 Antitrypsin, Ser: 132 mg/dL (ref 83–199)

## 2021-10-14 LAB — ANA: Anti Nuclear Antibody (ANA): NEGATIVE

## 2021-10-14 LAB — RHEUMATOID FACTOR: Rheumatoid fact SerPl-aCnc: <14 IU/mL (ref <14)

## 2021-10-14 LAB — HIV ANTIBODY (ROUTINE TESTING W REFLEX): HIV 1&2 Ab, 4th Generation: NONREACTIVE

## 2021-10-14 LAB — C-REACTIVE PROTEIN: CRP: 5.8 mg/L (ref <8.0)

## 2021-10-29 DIAGNOSIS — M5136 Other intervertebral disc degeneration, lumbar region: Secondary | ICD-10-CM | POA: Diagnosis not present

## 2021-11-10 DIAGNOSIS — M48062 Spinal stenosis, lumbar region with neurogenic claudication: Secondary | ICD-10-CM | POA: Diagnosis not present

## 2021-11-10 DIAGNOSIS — M5136 Other intervertebral disc degeneration, lumbar region: Secondary | ICD-10-CM | POA: Diagnosis not present

## 2021-11-10 DIAGNOSIS — M5416 Radiculopathy, lumbar region: Secondary | ICD-10-CM | POA: Diagnosis not present

## 2021-11-11 DIAGNOSIS — G4733 Obstructive sleep apnea (adult) (pediatric): Secondary | ICD-10-CM | POA: Diagnosis not present

## 2021-11-13 ENCOUNTER — Ambulatory Visit (INDEPENDENT_AMBULATORY_CARE_PROVIDER_SITE_OTHER): Payer: Medicare HMO | Admitting: Internal Medicine

## 2021-11-13 ENCOUNTER — Encounter: Payer: Self-pay | Admitting: Internal Medicine

## 2021-11-13 VITALS — BP 122/78 | HR 73 | Temp 98.0°F | Resp 16 | Ht 64.25 in | Wt 188.2 lb

## 2021-11-13 DIAGNOSIS — K2100 Gastro-esophageal reflux disease with esophagitis, without bleeding: Secondary | ICD-10-CM

## 2021-11-13 DIAGNOSIS — G4733 Obstructive sleep apnea (adult) (pediatric): Secondary | ICD-10-CM

## 2021-11-13 DIAGNOSIS — G894 Chronic pain syndrome: Secondary | ICD-10-CM | POA: Diagnosis not present

## 2021-11-13 DIAGNOSIS — F312 Bipolar disorder, current episode manic severe with psychotic features: Secondary | ICD-10-CM | POA: Diagnosis not present

## 2021-11-13 DIAGNOSIS — K7581 Nonalcoholic steatohepatitis (NASH): Secondary | ICD-10-CM

## 2021-11-13 DIAGNOSIS — F333 Major depressive disorder, recurrent, severe with psychotic symptoms: Secondary | ICD-10-CM | POA: Diagnosis not present

## 2021-11-13 MED ORDER — ESCITALOPRAM OXALATE 20 MG PO TABS: 20.0000 mg | ORAL_TABLET | Freq: Every day | ORAL | 1 refills | Status: DC

## 2021-11-13 NOTE — Progress Notes (Signed)
New Patient Office Visit  Subjective    Patient ID: Victoria [name], female    DOB: 19-Apr-1961  Age: 61 y.o. MRN: [id number]  CC:  Chief Complaint  Patient presents with   Follow-up    4 weeks     HPI Victoria [name] presents to for follow up.   Idiopathic Pulmonology Fibrosis/OSA: -First diagnosed in 2021 -Following with Pulmonology in [place], per patient had signs of IPF on scans but then this resolved  -Mother has a history of BOOP -Patient currently only on Albuterol inhaler.  -Recent CT scan without pulm process, following with pulmonology next week to go over results.  I am unable to see the scan. -Negative for alpha-1 antitrypsin in May 2023 -Has mild shortness of breath but no cough currently.  No wheezing.  Fibromyalgia/chronic pain: -Family history includes: sister with Lupus, daughter and granddaughter with Leslie Andrea, other sister diabetes inspidus, thyroid disease  -Dealing with severe pain for years, multiple joints with chronic pain and fatigue  -At our last visit blood work was obtained: ANA negative, inflammatory markers negative, TSH, free T3 and free T4 normal, RF normal.   Chronic Back Pain: -Follows Neurosurgery through [facility] but Psychologist, sport and exercise retired recently - now following at [place], had MRI's of spine on 10/08/21, cannot view these results. -Was seen by PM&R through [place] on 11/10/2021, note reviewed.  Planning to start physical therapy through [place].  Also planning on following up for epidural steroid injection. -Following with [facility] at [facility], who is prescribing Morphine 15 mg TID and Baclofen 10 mg TID. Victoria [name] is planning on follow up with them in 3 months. Note from 09/28/21 reviewed.  Victoria [name] would like a referral to a local pain management clinic today, as Victoria [name] plans to gradually taper off of the morphine.  Victoria [name] is also on Movantik and is having regular bowel movements. -Had been on Tramadol in the past but it did  not relieve her pain, Gabapentin contraindicated  -s/p MILD procedure 11/2019 -Last MRI cervical spine was in 10/2017 with foraminal narrowing   HLD: -Medications: None currently, history of statin intolerance - myalgias  -Last lipid panel: Lipid Panel     Component Value Date/Time   CHOL 254 (H) 09/29/2021 1204   TRIG 172 (H) 09/29/2021 1204   HDL 62 09/29/2021 1204   CHOLHDL 4.1 09/29/2021 1204   VLDL 21 07/06/2015 1604   LDLCALC 160 (H) 09/29/2021 1204   The 10-year ASCVD risk score (Arnett DK, et al., 2019) is: 3.7%   Values used to calculate the score:     Age: 14 years     Sex: Female     Is Non-Hispanic African American: No     Diabetic: No     Tobacco smoker: No     Systolic Blood Pressure: 794 mmHg     Is BP treated: No     HDL Cholesterol: 62 mg/dL     Total Cholesterol: 254 mg/dL  GERD: -Currently on Protonix 40 mg -Symptoms include reflux, epigastric pain -Following with GI at [place]  NASH: -Most recent LFTs elevated on 10/13/2021.  AST 39, ALT 57, alk phos 99 -Having fibroscan in November through GI  MDD: -Follows with Psychiatry but new referral placed at last office visit -Mood status: fluctuating  -Current treatment: Lexapro 10 mg, would like to increase dose today. -Symptom severity: severe  -Duration of current treatment : chronic -Side effects: no Medication compliance: excellent compliance Previous psychiatric medications: has been on  multiple and failed   Vitamin D Deficiency: -Last value 5/23 good at 53  -Currently on supplements of Vitamin D, 1000 IU daily  Health Maintenance: -Blood work up-to-date -Mammogram 9/22  Outpatient Encounter Medications as of 11/13/2021  Medication Sig   albuterol (VENTOLIN HFA) 108 (90 Base) MCG/ACT inhaler Inhale 1-2 puffs into the lungs every 6 (six) hours as needed for wheezing or shortness of breath.   baclofen (LIORESAL) 10 MG tablet Take 10 mg by mouth 3 (three) times daily.   cholecalciferol (VITAMIN  D) 25 MCG tablet Take 1 tablet (1,000 Units total) by mouth daily.   conjugated estrogens (PREMARIN) vaginal cream Premarin 0.625 mg/gram vaginal cream  INSERT INTO THE VAGINA EVERY OTHER DAY.   escitalopram (LEXAPRO) 10 MG tablet Take 1 tablet (10 mg total) by mouth daily.   hydrocortisone 2.5 % cream hydrocortisone 2.5 % topical cream with perineal applicator  APPLY TO AFFECTED AREA TWICE A DAY   MOVANTIK 25 MG TABS tablet Take 25 mg by mouth every morning.   ondansetron (ZOFRAN) 4 MG tablet Take 4 mg by mouth 2 (two) times daily.   oxyCODONE (ROXICODONE) 15 MG immediate release tablet Take 15 mg by mouth every 8 (eight) hours as needed.   pantoprazole (PROTONIX) 40 MG tablet Take 1 tablet (40 mg total) by mouth daily.   polyvinyl alcohol (LIQUIFILM TEARS) 1.4 % ophthalmic solution Place 1 drop into both eyes 3 (three) times daily.   No facility-administered encounter medications on file as of 11/13/2021.    Past Medical History:  Diagnosis Date   Asthma    Cervical spinal stenosis    Fatty liver    GERD (gastroesophageal reflux disease)    Hypercholesterolemia    Hypertension    Lumbar stenosis    Melanocarcinoma (HCC)    Osteoarthritis of both hips    Psychotic disorder (HCC)    Pulmonary fibrosis (HCC)    Sleep apnea     Past Surgical History:  Procedure Laterality Date   ABDOMINAL HYSTERECTOMY     APPENDECTOMY     BREAST EXCISIONAL BIOPSY Right 1997   neg   HIATAL HERNIA REPAIR     Jan 2017   LAPAROSCOPY      Family History  Problem Relation Age of Onset   Alcohol abuse Mother    Arthritis Mother    Heart disease Mother    Liver disease Mother    Osteoporosis Mother    Clotting disorder Mother    Diabetes Father    Arthritis Father    Stroke Father    Sarcoidosis Father    Heart failure Father    Diabetes Sister    Arthritis Sister    Thyroid disease Sister    Lupus Sister    Ehlers-Danlos syndrome Daughter     Social History   Socioeconomic  History   Marital status: Married    Spouse name: Not on file   Number of children: Not on file   Years of education: Not on file   Highest education level: Not on file  Occupational History   Occupation: home maker  Tobacco Use   Smoking status: Former   Smokeless tobacco: Never  Scientific laboratory technician Use: Never used  Substance and Sexual Activity   Alcohol use: No    Alcohol/week: 0.0 standard drinks of alcohol   Drug use: No    Comment: No use of illict drugs.   Sexual activity: Not Currently  Other Topics Concern   Not  on file  Social History Narrative   Lives with husband at home.   Social Determinants of Health   Financial Resource Strain: Not on file  Food Insecurity: Not on file  Transportation Needs: Not on file  Physical Activity: Not on file  Stress: Not on file  Social Connections: Not on file  Intimate Partner Violence: Not on file    Review of Systems  Constitutional:  Negative for chills and fever.  Respiratory:  Negative for cough, shortness of breath and wheezing.   Cardiovascular:  Negative for chest pain.  Gastrointestinal:  Negative for abdominal pain, heartburn, nausea and vomiting.  All other systems reviewed and are negative.       Objective    BP 122/78   Pulse 73   Temp 98 F (36.7 C)   Resp 16   Ht 5' 4.25" (1.632 m)   Wt 188 lb 3.2 oz (85.4 kg)   SpO2 97%   BMI 32.05 kg/m   Physical Exam Constitutional:      Appearance: Normal appearance.  HENT:     Head: Normocephalic and atraumatic.     Mouth/Throat:     Mouth: Mucous membranes are moist.     Pharynx: Oropharynx is clear.  Eyes:     Conjunctiva/sclera: Conjunctivae normal.  Cardiovascular:     Rate and Rhythm: Normal rate and regular rhythm.  Pulmonary:     Effort: Pulmonary effort is normal.     Breath sounds: Normal breath sounds.  Abdominal:     Comments: Epigastric pain to palpation  Musculoskeletal:     Right lower leg: No edema.     Left lower leg: No  edema.  Skin:    General: Skin is warm and dry.  Neurological:     General: No focal deficit present.     Mental Status: Victoria [name] is alert. Mental status is at baseline.  Psychiatric:        Mood and Affect: Mood normal.        Behavior: Behavior normal.         Assessment & Plan:   1. MDD (major depressive disorder), recurrent, severe, with psychosis (HCC)/Bipolar I disorder, most recent episode manic, severe with psychotic features ([place]): Lexapro increased to 20 mg daily today.  Referral placed to psychiatry for both bipolar and you with occasional psychosis.  - escitalopram (LEXAPRO) 20 MG tablet; Take 1 tablet (20 mg total) by mouth daily.  Dispense: 90 tablet; Refill: 1 - Ambulatory referral to Psychiatry  2. Chronic pain syndrome: Autoimmune and inflammatory work-up negative.  Victoria [name] still following with neurosurgery.  Victoria [name] is currently on morphine 15 mg 3 times daily and baclofen 10 mg 3 times daily.  Today Victoria [name] is asking for a referral to a local pain specialist to help her taper off of these medications.  Referral placed.  - Ambulatory referral to Pain Clinic  3. OSA (obstructive sleep apnea): Following with her pulmonologist soon to discuss sleep study results.  Again we discussed her insomnia, however due to her being on such high doses of morphine I do not feel comfortable giving her a controlled substance for sleep.  Victoria [name] has failed trazodone and doxepin in the past.  Victoria [name] is currently on melatonin, which Victoria [name] states helps a little bit.  Also had a remote history of IPF, however Victoria [name] states her most recent CT scan of her lungs was negative for signs of fibrosis.  Requesting pulmonologist notes for upcoming encounter and recent CT scan results.  4.  Nonalcoholic steatohepatitis: Liver enzymes mildly elevated on blood work from last month.  Victoria [name] is following with GI and planning on undergoing FibroScan in November.  5. Gastroesophageal reflux disease with esophagitis without hemorrhage:  Stable.  Continue Protonix 40 mg daily.  Return in about 3 months (around 02/13/2022).   Teodora Medici, DO

## 2021-11-13 NOTE — Patient Instructions (Addendum)
It was great seeing you today!  Plan discussed at today's visit: -Medications refilled -Referrals to Psychiatry and Pain management ordered today  Follow up in: 3 months  Take care and let us know if you have any questions or concerns prior to your next visit.  Dr. Rosana Berger

## 2021-11-16 ENCOUNTER — Ambulatory Visit: Payer: Medicare HMO | Admitting: Podiatry

## 2021-11-16 DIAGNOSIS — M5416 Radiculopathy, lumbar region: Secondary | ICD-10-CM | POA: Diagnosis not present

## 2021-11-16 DIAGNOSIS — M6281 Muscle weakness (generalized): Secondary | ICD-10-CM | POA: Diagnosis not present

## 2021-11-18 ENCOUNTER — Ambulatory Visit: Payer: Medicare HMO | Admitting: Podiatry

## 2021-11-18 ENCOUNTER — Encounter: Payer: Self-pay | Admitting: Podiatry

## 2021-11-18 DIAGNOSIS — M722 Plantar fascial fibromatosis: Secondary | ICD-10-CM | POA: Diagnosis not present

## 2021-11-18 DIAGNOSIS — S90222A Contusion of left lesser toe(s) with damage to nail, initial encounter: Secondary | ICD-10-CM

## 2021-11-18 MED ORDER — MELOXICAM 15 MG PO TABS
15.0000 mg | ORAL_TABLET | Freq: Every day | ORAL | 3 refills | Status: DC
Start: 2021-11-18 — End: 2022-04-18

## 2021-11-18 MED ORDER — TRIAMCINOLONE ACETONIDE 40 MG/ML IJ SUSP
40.0000 mg | Freq: Once | INTRAMUSCULAR | Status: AC
  Administered 2021-11-18: 40 mg

## 2021-11-18 MED ORDER — METHYLPREDNISOLONE 4 MG PO TBPK: ORAL_TABLET | ORAL | 0 refills | Status: DC

## 2021-11-18 NOTE — Progress Notes (Signed)
Subjective:  Patient ID: Victoria [name], female    DOB: [birth year],  MRN: [id number] HPI Chief Complaint  Patient presents with   Nail Problem    Hallux left - bumped toenail couple weeks ago, bruised and concerned about fungus   New Patient (Initial Visit)    61 y.o. female presents with the above complaint.   ROS: Denies fever chills nausea vomiting muscle aches pains calf pain back pain chest pain shortness of breath.  She states that she has a history of back problems with radiculopathies and neuropathies.  She states that she also has had toenail margins removed which are painful again.  She goes on to say that her feet are generally painful bilaterally.  Past Medical History:  Diagnosis Date   Asthma    Cervical spinal stenosis    Fatty liver    GERD (gastroesophageal reflux disease)    Hypercholesterolemia    Hypertension    Lumbar stenosis    Melanocarcinoma (HCC)    Osteoarthritis of both hips    Psychotic disorder (HCC)    Pulmonary fibrosis ([place])    Sleep apnea    Past Surgical History:  Procedure Laterality Date   ABDOMINAL HYSTERECTOMY     APPENDECTOMY     BREAST EXCISIONAL BIOPSY Right 1997   neg   HIATAL HERNIA REPAIR     Jan 2017   LAPAROSCOPY      Current Outpatient Medications:    meloxicam (MOBIC) 15 MG tablet, Take 1 tablet (15 mg total) by mouth daily., Disp: 30 tablet, Rfl: 3   methylPREDNISolone (MEDROL DOSEPAK) 4 MG TBPK tablet, 6 day dose pack - take as directed, Disp: 21 tablet, Rfl: 0   albuterol (VENTOLIN HFA) 108 (90 Base) MCG/ACT inhaler, Inhale 1-2 puffs into the lungs every 6 (six) hours as needed for wheezing or shortness of breath., Disp: 18 g, Rfl: 1   baclofen (LIORESAL) 10 MG tablet, Take 10 mg by mouth 3 (three) times daily., Disp: , Rfl:    cholecalciferol (VITAMIN D) 25 MCG tablet, Take 1 tablet (1,000 Units total) by mouth daily., Disp: 30 tablet, Rfl: 1   conjugated estrogens (PREMARIN) vaginal cream, Premarin 0.625 mg/gram  vaginal cream  INSERT INTO THE VAGINA EVERY OTHER DAY., Disp: , Rfl:    escitalopram (LEXAPRO) 20 MG tablet, Take 1 tablet (20 mg total) by mouth daily., Disp: 90 tablet, Rfl: 1   hydrocortisone 2.5 % cream, hydrocortisone 2.5 % topical cream with perineal applicator  APPLY TO AFFECTED AREA TWICE A DAY, Disp: , Rfl:    MOVANTIK 25 MG TABS tablet, Take 25 mg by mouth every morning., Disp: , Rfl:    ondansetron (ZOFRAN) 4 MG tablet, Take 4 mg by mouth 2 (two) times daily., Disp: , Rfl:    oxyCODONE (ROXICODONE) 15 MG immediate release tablet, Take 15 mg by mouth every 8 (eight) hours as needed., Disp: , Rfl:    pantoprazole (PROTONIX) 40 MG tablet, Take 1 tablet (40 mg total) by mouth daily., Disp: 30 tablet, Rfl: 1   polyvinyl alcohol (LIQUIFILM TEARS) 1.4 % ophthalmic solution, Place 1 drop into both eyes 3 (three) times daily., Disp: 15 mL, Rfl: 1  Allergies  Allergen Reactions   Atorvastatin     Myalgias    Sertraline Hives and Shortness Of Breath   Amoxicillin    Aspirin    Cefuroxime Hives   Codeine    Hydrocodone Hives   Penicillins    Sulfa Antibiotics    Review of Systems  Objective:  There were no vitals filed for this visit.  General: Well developed, nourished, in no acute distress, alert and oriented x3   Dermatological: Skin is warm, dry and supple bilateral. Nails x 10 are well maintained; remaining integument appears unremarkable at this time. There are no open sores, no preulcerative lesions, no rash or signs of infection present.  Sharp incurvated nail margins along the tibial borders of the hallux bilaterally at this point it does not look like any previous surgeries have been performed that would be permanent in nature though she relates this.  Vascular: Dorsalis Pedis artery and Posterior Tibial artery pedal pulses are 2/4 bilateral with immedate capillary fill time. Pedal hair growth present. No varicosities and no lower extremity edema present bilateral.    Neruologic: Grossly intact via light touch bilateral. Vibratory intact via tuning fork bilateral. Protective threshold with Semmes Wienstein monofilament intact to all pedal sites bilateral. Patellar and Achilles deep tendon reflexes 2+ bilateral. No Babinski or clonus noted bilateral.   Musculoskeletal: No gross boney pedal deformities bilateral. No pain, crepitus, or limitation noted with foot and ankle range of motion bilateral. Muscular strength 5/5 in all groups tested bilateral.  Pain on palpation medial calcaneal tubercles bilateral.  No pain on medial-lateral compression of the calcaneus no pain on range of motion of the forefoot.  She does have tenderness on palpation of the fourth fifth tarsometatarsal joints bilaterally.  Gait: Unassisted, Nonantalgic.    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  Plan: Discussed etiology pathology conservative or surgical therapies at this point I injected her bilateral heels 20 mg Kenalog 5 mg Marcaine.  Placed her in bilateral plantar fascia braces discussed appropriate shoe gear stretching exercises ice therapy sugar modifications also started her on a Medrol Dosepak to be followed by meloxicam.  I will follow-up with her in 1 month.  We did consider at that time possibly performing permanent chemical matricectomy's to the toes.     Ileen Kahre T. Witt, [place]

## 2021-11-18 NOTE — Patient Instructions (Signed)

## 2021-11-20 DIAGNOSIS — M48062 Spinal stenosis, lumbar region with neurogenic claudication: Secondary | ICD-10-CM | POA: Diagnosis not present

## 2021-11-20 DIAGNOSIS — M5416 Radiculopathy, lumbar region: Secondary | ICD-10-CM | POA: Diagnosis not present

## 2021-12-07 DIAGNOSIS — G4733 Obstructive sleep apnea (adult) (pediatric): Secondary | ICD-10-CM | POA: Diagnosis not present

## 2021-12-07 DIAGNOSIS — J8410 Pulmonary fibrosis, unspecified: Secondary | ICD-10-CM | POA: Diagnosis not present

## 2021-12-07 DIAGNOSIS — R0602 Shortness of breath: Secondary | ICD-10-CM | POA: Diagnosis not present

## 2021-12-11 DIAGNOSIS — M5136 Other intervertebral disc degeneration, lumbar region: Secondary | ICD-10-CM | POA: Diagnosis not present

## 2021-12-11 DIAGNOSIS — M48062 Spinal stenosis, lumbar region with neurogenic claudication: Secondary | ICD-10-CM | POA: Diagnosis not present

## 2021-12-11 DIAGNOSIS — M5416 Radiculopathy, lumbar region: Secondary | ICD-10-CM | POA: Diagnosis not present

## 2021-12-17 DIAGNOSIS — F29 Unspecified psychosis not due to a substance or known physiological condition: Secondary | ICD-10-CM | POA: Diagnosis not present

## 2021-12-17 DIAGNOSIS — F319 Bipolar disorder, unspecified: Secondary | ICD-10-CM | POA: Diagnosis not present

## 2021-12-17 DIAGNOSIS — Z20822 Contact with and (suspected) exposure to covid-19: Secondary | ICD-10-CM | POA: Diagnosis not present

## 2021-12-19 DIAGNOSIS — Z91148 Patient's other noncompliance with medication regimen for other reason: Secondary | ICD-10-CM | POA: Diagnosis not present

## 2021-12-19 DIAGNOSIS — N882 Stricture and stenosis of cervix uteri: Secondary | ICD-10-CM | POA: Diagnosis not present

## 2021-12-19 DIAGNOSIS — K219 Gastro-esophageal reflux disease without esophagitis: Secondary | ICD-10-CM | POA: Diagnosis not present

## 2021-12-19 DIAGNOSIS — E785 Hyperlipidemia, unspecified: Secondary | ICD-10-CM | POA: Diagnosis not present

## 2021-12-19 DIAGNOSIS — Z62810 Personal history of physical and sexual abuse in childhood: Secondary | ICD-10-CM | POA: Diagnosis not present

## 2021-12-19 DIAGNOSIS — J45909 Unspecified asthma, uncomplicated: Secondary | ICD-10-CM | POA: Diagnosis not present

## 2021-12-19 DIAGNOSIS — M1990 Unspecified osteoarthritis, unspecified site: Secondary | ICD-10-CM | POA: Diagnosis not present

## 2021-12-19 DIAGNOSIS — F3164 Bipolar disorder, current episode mixed, severe, with psychotic features: Secondary | ICD-10-CM | POA: Diagnosis not present

## 2021-12-19 DIAGNOSIS — F319 Bipolar disorder, unspecified: Secondary | ICD-10-CM | POA: Diagnosis not present

## 2021-12-19 DIAGNOSIS — K449 Diaphragmatic hernia without obstruction or gangrene: Secondary | ICD-10-CM | POA: Diagnosis not present

## 2021-12-23 ENCOUNTER — Ambulatory Visit: Payer: Medicare HMO | Admitting: Podiatry

## 2021-12-29 DIAGNOSIS — F319 Bipolar disorder, unspecified: Secondary | ICD-10-CM | POA: Diagnosis not present

## 2021-12-30 DIAGNOSIS — M48062 Spinal stenosis, lumbar region with neurogenic claudication: Secondary | ICD-10-CM | POA: Diagnosis not present

## 2021-12-30 DIAGNOSIS — Z760 Encounter for issue of repeat prescription: Secondary | ICD-10-CM | POA: Diagnosis not present

## 2021-12-30 DIAGNOSIS — M5412 Radiculopathy, cervical region: Secondary | ICD-10-CM | POA: Diagnosis not present

## 2021-12-30 DIAGNOSIS — G8929 Other chronic pain: Secondary | ICD-10-CM | POA: Diagnosis not present

## 2021-12-30 DIAGNOSIS — Z79899 Other long term (current) drug therapy: Secondary | ICD-10-CM | POA: Diagnosis not present

## 2021-12-30 DIAGNOSIS — Z79891 Long term (current) use of opiate analgesic: Secondary | ICD-10-CM | POA: Diagnosis not present

## 2021-12-30 DIAGNOSIS — M5416 Radiculopathy, lumbar region: Secondary | ICD-10-CM | POA: Diagnosis not present

## 2022-01-05 ENCOUNTER — Inpatient Hospital Stay: Payer: Medicare HMO | Admitting: Internal Medicine

## 2022-01-05 NOTE — Progress Notes (Deleted)
   Established Patient Office Visit  Subjective   Patient ID: Victoria [name], female    DOB: 1961/01/18  Age: 61 y.o. MRN: [id number]  No chief complaint on file.   HPI  Victoria [name] is a 61 year old female here for hospital follow up.  Discharge Date: *** Hospital/facility: [facility] Diagnosis: *** Procedures/tests: *** Consultants: *** New medications: *** Discontinued medications: *** Discharge instructions:  *** Status: {Blank multiple:19196::"better","worse","stable","fluctuating"}  MDD: -Mood status: {Blank single:19197::"controlled","uncontrolled","better","worse","exacerbated","stable"} -Current treatment: *** -Satisfied with current treatment?: {Blank single:19197::"yes","no"} -Symptom severity: {Blank single:19197::"mild","moderate","severe"}  -Duration of current treatment : {Blank single:19197::"chronic","months","years"} -Side effects: {Blank single:19197::"yes","no"} Medication compliance: {Blank single:19197::"excellent compliance","good compliance","fair compliance","poor compliance"} Psychotherapy/counseling: {Blank single:19197::"yes","no"} {Blank single:19197::"current","in the past"} Previous psychiatric medications: {Blank multiple:19196::"abilify","amitryptiline","buspar","celexa","cymbalta","depakote","effexor","lamictal","lexapro","lithium","nortryptiline","paxil","prozac","pristiq (desvenlafaxine","seroquel","wellbutrin","zoloft","zyprexa"} Depressed mood: {Blank single:19197::"yes","no"} Anxious mood: {Blank single:19197::"yes","no"} Anhedonia: {Blank single:19197::"yes","no"} Significant weight loss or gain: {Blank single:19197::"yes","no"} Insomnia: {Blank single:19197::"yes","no"} {Blank single:19197::"hard to fall asleep","hard to stay asleep"} Fatigue: {Blank single:19197::"yes","no"} Feelings of worthlessness or guilt: {Blank single:19197::"yes","no"} Impaired concentration/indecisiveness: {Blank single:19197::"yes","no"} Suicidal  ideations: {Blank single:19197::"yes","no"} Hopelessness: {Blank single:19197::"yes","no"} Crying spells: {Blank single:19197::"yes","no"}    11/13/2021   11:25 AM 10/13/2021   11:25 AM 09/29/2021   11:15 AM  Depression screen PHQ 2/9  Decreased Interest 0 0 3  Down, Depressed, Hopeless 0 1 2  PHQ - 2 Score 0 1 5  Altered sleeping 0 0 3  Tired, decreased energy 0 0 3  Change in appetite 0 0 3  Feeling bad or failure about yourself  0 0 2  Trouble concentrating 0 0 3  Moving slowly or fidgety/restless 0 0 2  Suicidal thoughts 0 0 2  PHQ-9 Score 0 1 23  Difficult doing work/chores Not difficult at all Not difficult at all Somewhat difficult      {History (Optional):23778}  ROS    Objective:     There were no vitals taken for this visit. {Vitals History (Optional):23777}  Physical Exam   No results found for any visits on 01/05/22.  {Labs (Optional):23779}  The 10-year ASCVD risk score (Arnett DK, et al., 2019) is: 3.6%    Assessment & Plan:   Problem List Items Addressed This Visit   None   No follow-ups on file.    Teodora Medici, DO
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
## 2022-01-12 ENCOUNTER — Encounter: Payer: Self-pay | Admitting: Psychiatry

## 2022-01-12 ENCOUNTER — Ambulatory Visit: Payer: Medicare HMO | Admitting: Psychiatry

## 2022-01-12 VITALS — BP 126/76 | HR 70 | Temp 98.3°F | Wt 183.0 lb

## 2022-01-12 DIAGNOSIS — F4310 Post-traumatic stress disorder, unspecified: Secondary | ICD-10-CM

## 2022-01-12 DIAGNOSIS — F331 Major depressive disorder, recurrent, moderate: Secondary | ICD-10-CM | POA: Diagnosis not present

## 2022-01-20 NOTE — Progress Notes (Unsigned)
   Acute Office Visit  Subjective:     Patient ID: Kista Robb, female    DOB: 10/13/1960, 61 y.o.   MRN: [id number]  No chief complaint on file.   HPI Patient is in today for skin irritation on legs.   RASH Duration:  {Blank single:19197::"chronic","days","weeks","months"}  Location: {Blank multiple:19196::"generalized","trunk","face","hands","arms","legs","groin"}  Itching: {Blank single:19197::"yes","no"} Burning: {Blank single:19197::"yes","no"} Redness: {Blank single:19197::"yes","no"} Oozing: {Blank single:19197::"yes","no"} Scaling: {Blank single:19197::"yes","no"} Blisters: {Blank single:19197::"yes","no"} Painful: {Blank single:19197::"yes","no"} Fevers: {Blank single:19197::"yes","no"} Change in detergents/soaps/personal care products: {Blank single:19197::"yes","no"} Recent illness: {Blank single:19197::"yes","no"} Recent travel:{Blank single:19197::"yes","no"} History of same: {Blank single:19197::"yes","no"} Context: {Blank multiple:19196::"better","worse","stable","fluctuating","contacts with the same"} Alleviating factors: {Blank multiple:19196::"hydrocortisone cream","benadryl","lotion/moisturizer","nothing"} Treatments attempted:{Blank multiple:19196::"hydrocortisone cream","benadryl","OTC anit-fungal","lotion/moisturizer","nothing"} Shortness of breath: {Blank single:19197::"yes","no"}  Throat/tongue swelling: {Blank single:19197::"yes","no"} Myalgias/arthralgias: {Blank single:19197::"yes","no"}   ROS      Objective:    There were no vitals taken for this visit. {Vitals History (Optional):23777}  Physical Exam  No results found for any visits on 01/21/22.      Assessment & Plan:   Problem List Items Addressed This Visit   None   No orders of the defined types were placed in this encounter.   No follow-ups on file.  Teodora Medici, DO

## 2022-01-21 ENCOUNTER — Ambulatory Visit (INDEPENDENT_AMBULATORY_CARE_PROVIDER_SITE_OTHER): Payer: Medicare HMO | Admitting: Internal Medicine

## 2022-01-21 ENCOUNTER — Encounter: Payer: Self-pay | Admitting: Internal Medicine

## 2022-01-21 VITALS — BP 126/80 | HR 72 | Temp 98.1°F | Resp 16 | Ht 64.25 in | Wt 180.9 lb

## 2022-01-21 DIAGNOSIS — A6920 Lyme disease, unspecified: Secondary | ICD-10-CM | POA: Diagnosis not present

## 2022-01-21 DIAGNOSIS — R21 Rash and other nonspecific skin eruption: Secondary | ICD-10-CM | POA: Diagnosis not present

## 2022-01-21 NOTE — Patient Instructions (Addendum)
It was great seeing you today!  Plan discussed at today's visit: -Blood work ordered today, results will be uploaded to MyChart.  -Stop Mobic, continue Benadryl and second generation anti-histamine -Referral to Dermatology   Follow up in: as needed  Take care and let us know if you have any questions or concerns prior to your next visit.  Dr. Rosana Berger

## 2022-01-25 LAB — CBC WITH DIFFERENTIAL/PLATELET
Absolute Monocytes: 440 cells/uL (ref 200–950)
Basophils Absolute: 28 cells/uL (ref 0–200)
Basophils Relative: 0.5 %
Eosinophils Absolute: 143 cells/uL (ref 15–500)
Eosinophils Relative: 2.6 %
HCT: 41.5 % (ref 35.0–45.0)
Hemoglobin: 13.9 g/dL (ref 11.7–15.5)
Lymphs Abs: 1969 cells/uL (ref 850–3900)
MCH: 31 pg (ref 27.0–33.0)
MCHC: 33.5 g/dL (ref 32.0–36.0)
MCV: 92.4 fL (ref 80.0–100.0)
MPV: 10.8 fL (ref 7.5–12.5)
Monocytes Relative: 8 %
Neutro Abs: 2921 cells/uL (ref 1500–7800)
Neutrophils Relative %: 53.1 %
Platelets: 230 10*3/uL (ref 140–400)
RBC: 4.49 10*6/uL (ref 3.80–5.10)
RDW: 13.1 % (ref 11.0–15.0)
Total Lymphocyte: 35.8 %
WBC: 5.5 10*3/uL (ref 3.8–10.8)

## 2022-01-25 LAB — B. BURGDORFI ANTIBODIES BY WB
B burgdorferi IgG Abs (IB): NEGATIVE
B burgdorferi IgM Abs (IB): NEGATIVE
Lyme Disease 18 kD IgG: NONREACTIVE
Lyme Disease 23 kD IgG: REACTIVE — AB
Lyme Disease 23 kD IgM: NONREACTIVE
Lyme Disease 28 kD IgG: NONREACTIVE
Lyme Disease 30 kD IgG: NONREACTIVE
Lyme Disease 39 kD IgG: NONREACTIVE
Lyme Disease 39 kD IgM: NONREACTIVE
Lyme Disease 41 kD IgG: REACTIVE — AB
Lyme Disease 41 kD IgM: REACTIVE — AB
Lyme Disease 45 kD IgG: NONREACTIVE
Lyme Disease 58 kD IgG: NONREACTIVE
Lyme Disease 66 kD IgG: NONREACTIVE
Lyme Disease 93 kD IgG: NONREACTIVE

## 2022-01-25 LAB — COMPLETE METABOLIC PANEL WITH GFR
AG Ratio: 1.5 (calc) (ref 1.0–2.5)
ALT: 27 U/L (ref 6–29)
AST: 31 U/L (ref 10–35)
Albumin: 4 g/dL (ref 3.6–5.1)
Alkaline phosphatase (APISO): 95 U/L (ref 37–153)
BUN: 16 mg/dL (ref 7–25)
CO2: 23 mmol/L (ref 20–32)
Calcium: 9.1 mg/dL (ref 8.6–10.4)
Chloride: 106 mmol/L (ref 98–110)
Creat: 0.96 mg/dL (ref 0.50–1.05)
Globulin: 2.6 g/dL (calc) (ref 1.9–3.7)
Glucose, Bld: 88 mg/dL (ref 65–99)
Potassium: 4.4 mmol/L (ref 3.5–5.3)
Sodium: 140 mmol/L (ref 135–146)
Total Bilirubin: 0.5 mg/dL (ref 0.2–1.2)
Total Protein: 6.6 g/dL (ref 6.1–8.1)
eGFR: 67 mL/min/{1.73_m2} (ref >=60)

## 2022-01-25 LAB — ANA: Anti Nuclear Antibody (ANA): NEGATIVE

## 2022-01-25 LAB — TICK-BORNE DISEASE,ACUTE MOLECULAR PANEL
Anaplasma Phagocytophilum DNA,QL Real Time PCR: NOT DETECTED
BORRELIA MIYAMOTOI DNA, QL REAL TIME PCR: NOT DETECTED
BORRELIA SPECIES DNA, QL REAL TIME PCR: NOT DETECTED
Babesia Microti DNA,Real Time PCR: NOT DETECTED
Ehrlichia Chaffeensis DNA Real Time PCR: NOT DETECTED

## 2022-01-25 LAB — C-REACTIVE PROTEIN: CRP: 7.6 mg/L (ref <8.0)

## 2022-01-25 LAB — SEDIMENTATION RATE: Sed Rate: 19 mm/h (ref 0–30)

## 2022-01-25 MED ORDER — DOXYCYCLINE HYCLATE 100 MG PO TABS: 100.0000 mg | ORAL_TABLET | Freq: Two times a day (BID) | ORAL | 0 refills | Status: DC

## 2022-01-25 NOTE — Addendum Note (Signed)
Addended by: Teodora Medici on: 01/25/2022 09:15 AM   Modules accepted: Orders

## 2022-01-25 NOTE — Addendum Note (Signed)
Addended by: Teodora Medici on: 01/25/2022 03:37 PM   Modules accepted: Orders

## 2022-01-27 ENCOUNTER — Ambulatory Visit: Payer: Medicare HMO | Admitting: Dermatology

## 2022-02-08 ENCOUNTER — Ambulatory Visit (INDEPENDENT_AMBULATORY_CARE_PROVIDER_SITE_OTHER): Payer: Medicare HMO | Admitting: Internal Medicine

## 2022-02-08 ENCOUNTER — Encounter: Payer: Self-pay | Admitting: Internal Medicine

## 2022-02-08 VITALS — BP 124/68 | HR 74 | Temp 98.2°F | Resp 16 | Ht 64.25 in | Wt 183.6 lb

## 2022-02-08 DIAGNOSIS — M797 Fibromyalgia: Secondary | ICD-10-CM | POA: Diagnosis not present

## 2022-02-08 DIAGNOSIS — A6920 Lyme disease, unspecified: Secondary | ICD-10-CM | POA: Diagnosis not present

## 2022-02-08 MED ORDER — DOXYCYCLINE HYCLATE 100 MG PO TABS: 100.0000 mg | ORAL_TABLET | Freq: Two times a day (BID) | ORAL | 0 refills | Status: AC

## 2022-02-08 MED ORDER — GABAPENTIN 100 MG PO CAPS: 100.0000 mg | ORAL_CAPSULE | Freq: Three times a day (TID) | ORAL | 3 refills | Status: DC

## 2022-02-08 NOTE — Patient Instructions (Addendum)
It was great seeing you today!  Plan discussed at today's visit: -Continue Doxycycline 100 mg BID for another 7 days -Gabapentin 100 mg three times daily, do NOT go above 600 mg once a day   Follow up in: 3 months   Take care and let us know if you have any questions or concerns prior to your next visit.  Dr. Rosana Berger
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
## 2022-02-12 ENCOUNTER — Ambulatory Visit: Payer: Medicare HMO | Admitting: Internal Medicine

## 2022-02-15 ENCOUNTER — Ambulatory Visit: Payer: Medicare HMO | Admitting: Internal Medicine
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
## 2022-03-01 ENCOUNTER — Encounter: Payer: Self-pay | Admitting: Psychiatry

## 2022-03-01 ENCOUNTER — Ambulatory Visit: Payer: Medicare HMO | Admitting: Psychiatry

## 2022-03-01 VITALS — BP 156/81 | HR 81 | Temp 98.1°F | Wt 185.0 lb

## 2022-03-01 DIAGNOSIS — R41840 Attention and concentration deficit: Secondary | ICD-10-CM | POA: Diagnosis not present

## 2022-03-01 DIAGNOSIS — F4310 Post-traumatic stress disorder, unspecified: Secondary | ICD-10-CM

## 2022-03-01 DIAGNOSIS — F331 Major depressive disorder, recurrent, moderate: Secondary | ICD-10-CM

## 2022-03-01 MED ORDER — LURASIDONE HCL 20 MG PO TABS: 20.0000 mg | ORAL_TABLET | Freq: Every day | ORAL | 1 refills | Status: DC

## 2022-03-04 ENCOUNTER — Encounter: Payer: Self-pay | Admitting: Internal Medicine

## 2022-03-04 ENCOUNTER — Ambulatory Visit: Payer: Medicare HMO | Admitting: Internal Medicine

## 2022-03-04 VITALS — BP 128/76 | HR 86 | Temp 98.3°F | Resp 16 | Ht 64.25 in | Wt 185.1 lb

## 2022-03-04 DIAGNOSIS — A6923 Arthritis due to Lyme disease: Secondary | ICD-10-CM

## 2022-03-04 DIAGNOSIS — N6321 Unspecified lump in the left breast, upper outer quadrant: Secondary | ICD-10-CM | POA: Diagnosis not present

## 2022-03-04 NOTE — Progress Notes (Signed)
Acute Office Visit  Subjective:     Patient ID: Victoria [name], female    DOB: [birth year], 61 y.o.   MRN: [id number]  Chief Complaint  Patient presents with   Breast Mass    Left, due for mammogram    HPI Patient is in today for breast concern.   Breast Pain: -Duration : first noticed yesterday  - noticed at first because the area was itchy. She does have a history of lumpectomy on the right about 25 years ago but mammograms since have been normal.  -Location: left -Onset: sudden -Severity: moderate -Quality: dull and aching -Frequency: pain with palpation  -Redness: no -Swelling: no -Trauma: no trauma -Associated with menstral cycle: no -Nipple discharge: no -Breast lump: yes -Treatments attempted: none -Previous mammogram: yes -last mammograms 10/07/2017, BI-RADS 1[facility], cannot [facility] records  The patient is still having complications from Lyme. Tested positive for Lyme 01/21/22 after she came in with widespread rash. Patient was treated with Doxycycline 100 mg BID x 21 days total. She is still complaining of ongoing pain, especially in her back and legs. Rash resolved. Is taking Gabapentin 100 mg TID for pain, as well as Mobic and Oxycodone through her pain management doctor. Patient requesting referral to Lyme specialist.   Review of Systems  Constitutional:  Negative for chills and fever.  Skin:  Positive for itching.       Objective:    BP 128/76   Pulse 86   Temp 98.3 F (36.8 C)   Resp 16   Ht 5' 4.25" (1.632 m)   Wt 185 lb 1.6 oz (84 kg)   SpO2 93%   BMI 31.53 kg/m  BP Readings from Last 3 Encounters:  03/04/22 128/76  02/08/22 124/68  01/21/22 126/80   Wt Readings from Last 3 Encounters:  03/04/22 185 lb 1.6 oz (84 kg)  02/08/22 183 lb 9.6 oz (83.3 kg)  01/21/22 180 lb 14.4 oz (82.1 kg)      Physical Exam Exam conducted with a chaperone present.  Constitutional:      Appearance: Normal appearance.  HENT:     Head:  Normocephalic and atraumatic.  Eyes:     Conjunctiva/sclera: Conjunctivae normal.  Cardiovascular:     Rate and Rhythm: Normal rate and regular rhythm.  Pulmonary:     Effort: Pulmonary effort is normal.     Breath sounds: Normal breath sounds.  Chest:  Breasts:    Right: Normal.     Left: Mass, skin change and tenderness present. No swelling, bleeding, inverted nipple or nipple discharge.     Comments: Scattered small palpable lesions consistent with lymph node consistency, tender on exam.  Skin:    General: Skin is warm and dry.  Neurological:     General: No focal deficit present.     Mental Status: She is alert. Mental status is at baseline.  Psychiatric:        Mood and Affect: Mood normal.     No results found for any visits on 03/04/22.      Assessment & Plan:   1. Mass of upper outer quadrant of left breast: Patient is due for mammogram, will obtain US as well.   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 Patient requesting  referral to Lyme specialist, will place referral to Rheumatology.   - Ambulatory referral to Rheumatology  Return for already scheduled .  Teodora Medici, DO

## 2022-03-04 NOTE — Patient Instructions (Signed)
It was great seeing you today!  Plan discussed at today's visit: -Mammogram and breast ultrasound ordered -Referral to Rheumatology placed  Follow up in: December   Take care and let us know if you have any questions or concerns prior to your next visit.  Dr. Rosana Berger

## 2022-03-22 DIAGNOSIS — Z760 Encounter for issue of repeat prescription: Secondary | ICD-10-CM | POA: Diagnosis not present

## 2022-03-22 DIAGNOSIS — G8929 Other chronic pain: Secondary | ICD-10-CM | POA: Diagnosis not present

## 2022-03-22 DIAGNOSIS — M5442 Lumbago with sciatica, left side: Secondary | ICD-10-CM | POA: Diagnosis not present

## 2022-03-22 DIAGNOSIS — M5441 Lumbago with sciatica, right side: Secondary | ICD-10-CM | POA: Diagnosis not present

## 2022-03-22 DIAGNOSIS — M5412 Radiculopathy, cervical region: Secondary | ICD-10-CM | POA: Diagnosis not present

## 2022-03-29 ENCOUNTER — Inpatient Hospital Stay
Admission: RE | Admit: 2022-03-29 | Discharge: 2022-03-29 | Disposition: A | Discharge status: Home / Self Care | Payer: Self-pay | Source: Ambulatory Visit | Attending: *Deleted | Admitting: *Deleted

## 2022-03-29 ENCOUNTER — Other Ambulatory Visit: Payer: Self-pay | Admitting: *Deleted

## 2022-03-29 DIAGNOSIS — Z1231 Encounter for screening mammogram for malignant neoplasm of breast: Secondary | ICD-10-CM
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
## 2022-04-15 ENCOUNTER — Encounter: Payer: Self-pay | Admitting: Psychiatry

## 2022-04-15 ENCOUNTER — Ambulatory Visit: Payer: Medicare HMO | Admitting: Psychiatry

## 2022-04-15 VITALS — BP 129/85 | HR 81 | Temp 97.9°F | Ht 64.25 in | Wt 192.8 lb

## 2022-04-15 DIAGNOSIS — F331 Major depressive disorder, recurrent, moderate: Secondary | ICD-10-CM | POA: Diagnosis not present

## 2022-04-15 DIAGNOSIS — F4310 Post-traumatic stress disorder, unspecified: Secondary | ICD-10-CM

## 2022-04-15 MED ORDER — DULOXETINE HCL 30 MG PO CPEP: 30.0000 mg | ORAL_CAPSULE | Freq: Every day | ORAL | 0 refills | Status: DC

## 2022-04-15 MED ORDER — DULOXETINE HCL 60 MG PO CPEP: 60.0000 mg | ORAL_CAPSULE | Freq: Every day | ORAL | 0 refills | Status: DC

## 2022-04-15 NOTE — Patient Instructions (Signed)
Change medication as follows:  Week 1: decrease Lexapro 10 mg daily, start duloxetine 30 mg daily  Week 2: discontinue Lexapro, increase duloxetine 60 mg daily  Week 3:- continue duloxetine 60 mg daily  Next appointment: 1/11 at 8 AM, in person

## 2022-04-18 ENCOUNTER — Emergency Department
Admission: EM | Admit: 2022-04-18 | Discharge: 2022-04-18 | Disposition: A | Discharge status: Home / Self Care | Payer: Medicare HMO | Attending: Emergency Medicine | Admitting: Emergency Medicine

## 2022-04-18 DIAGNOSIS — M79604 Pain in right leg: Secondary | ICD-10-CM | POA: Diagnosis not present

## 2022-04-18 DIAGNOSIS — A6920 Lyme disease, unspecified: Secondary | ICD-10-CM | POA: Insufficient documentation

## 2022-04-18 DIAGNOSIS — M79605 Pain in left leg: Secondary | ICD-10-CM | POA: Diagnosis not present

## 2022-04-18 LAB — URINALYSIS, ROUTINE W REFLEX MICROSCOPIC
Bilirubin Urine: NEGATIVE
Glucose, UA: NEGATIVE mg/dL
Hgb urine dipstick: NEGATIVE
Ketones, ur: 5 mg/dL — AB
Leukocytes,Ua: NEGATIVE
Nitrite: NEGATIVE
Protein, ur: NEGATIVE mg/dL
Specific Gravity, Urine: 1.026 (ref 1.005–1.030)
pH: 6 (ref 5.0–8.0)

## 2022-04-18 LAB — CBC WITH DIFFERENTIAL/PLATELET
Abs Immature Granulocytes: 0.02 10*3/uL (ref 0.00–0.07)
Basophils Absolute: 0 10*3/uL (ref 0.0–0.1)
Basophils Relative: 0 %
Eosinophils Absolute: 0.1 10*3/uL (ref 0.0–0.5)
Eosinophils Relative: 1 %
HCT: 42.9 % (ref 36.0–46.0)
Hemoglobin: 14.4 g/dL (ref 12.0–15.0)
Immature Granulocytes: 0 %
Lymphocytes Relative: 27 %
Lymphs Abs: 2.1 10*3/uL (ref 0.7–4.0)
MCH: 30.6 pg (ref 26.0–34.0)
MCHC: 33.6 g/dL (ref 30.0–36.0)
MCV: 91.1 fL (ref 80.0–100.0)
Monocytes Absolute: 0.5 10*3/uL (ref 0.1–1.0)
Monocytes Relative: 7 %
Neutro Abs: 5.1 10*3/uL (ref 1.7–7.7)
Neutrophils Relative %: 65 %
Platelets: 238 10*3/uL (ref 150–400)
RBC: 4.71 MIL/uL (ref 3.87–5.11)
RDW: 13.2 % (ref 11.5–15.5)
WBC: 7.8 10*3/uL (ref 4.0–10.5)
nRBC: 0 % (ref 0.0–0.2)

## 2022-04-18 LAB — COMPREHENSIVE METABOLIC PANEL
ALT: 48 U/L — ABNORMAL HIGH (ref 0–44)
AST: 36 U/L (ref 15–41)
Albumin: 4.1 g/dL (ref 3.5–5.0)
Alkaline Phosphatase: 91 U/L (ref 38–126)
Anion gap: 8 (ref 5–15)
BUN: 19 mg/dL (ref 8–23)
CO2: 22 mmol/L (ref 22–32)
Calcium: 9.4 mg/dL (ref 8.9–10.3)
Chloride: 108 mmol/L (ref 98–111)
Creatinine, Ser: 0.85 mg/dL (ref 0.44–1.00)
GFR, Estimated: >60 mL/min (ref >60)
Glucose, Bld: 119 mg/dL — ABNORMAL HIGH (ref 70–99)
Potassium: 4.3 mmol/L (ref 3.5–5.1)
Sodium: 138 mmol/L (ref 135–145)
Total Bilirubin: 0.6 mg/dL (ref 0.3–1.2)
Total Protein: 7.7 g/dL (ref 6.5–8.1)

## 2022-04-18 MED ORDER — KETOROLAC TROMETHAMINE 60 MG/2ML IM SOLN
60.0000 mg | Freq: Once | INTRAMUSCULAR | Status: AC
  Administered 2022-04-18: 60 mg via INTRAMUSCULAR
  Filled 2022-04-18: qty 2

## 2022-04-18 MED ORDER — PREDNISONE 10 MG PO TABS: ORAL_TABLET | ORAL | 0 refills | Status: AC

## 2022-04-18 MED ORDER — HYDROMORPHONE HCL 1 MG/ML IJ SOLN
1.0000 mg | Freq: Once | INTRAMUSCULAR | Status: AC
  Administered 2022-04-18: 1 mg via INTRAMUSCULAR
  Filled 2022-04-18: qty 1

## 2022-04-18 MED ORDER — MELOXICAM 15 MG PO TABS: 15.0000 mg | ORAL_TABLET | Freq: Every day | ORAL | 0 refills | Status: AC

## 2022-04-18 MED ORDER — PREDNISONE 20 MG PO TABS
60.0000 mg | ORAL_TABLET | Freq: Once | ORAL | Status: AC
  Administered 2022-04-18: 60 mg via ORAL
  Filled 2022-04-18: qty 3

## 2022-04-18 NOTE — Discharge Instructions (Signed)
Take the steroids as prescribed.  Instead of Ibuprofen, take the mobic daily with food.  Continue your other pain medications and baclofen.  Follow-up with Rheumatology.  Drink plenty of fluids.

## 2022-04-18 NOTE — ED Provider Triage Note (Signed)
Emergency Medicine Provider Triage Evaluation Note  Victoria [name] , a 61 y.o. female  was evaluated in triage.  Pt complains of back pain, leg pain.  History of Lyme's  Review of Systems  Positive:  Negative:   Physical Exam  BP (!) 158/80 (BP Location: Right Arm)   Pulse 78   Temp (!) 97.5 F (36.4 C) (Oral)   Resp 18   Ht 5' 4"  (1.626 m)   Wt 86.2 kg   SpO2 95%   BMI 32.61 kg/m  Gen:   Awake, no distress   Resp:  Normal effort  MSK:   Moves extremities without difficulty  Other:    Medical Decision Making  Medically screening exam initiated at 5:28 PM.  Appropriate orders placed.  Victoria [name] was informed that the remainder of the evaluation will be completed by another provider, this initial triage assessment does not replace that evaluation, and the importance of remaining in the ED until their evaluation is complete.     Victoria Starks, PA-C 04/18/22 1729

## 2022-04-18 NOTE — ED Notes (Signed)
Discharge instructions reviewed with patient as well as husband. No questions.

## 2022-04-18 NOTE — ED Triage Notes (Signed)
Pt presents to the ED via POV due to Leg pain. Pt states it is difficult to walk and stands. Pt states she has a decrease in appetite. N/V/D. Pt states she is pain 5/10.

## 2022-04-18 NOTE — ED Provider Notes (Signed)
[facility] Provider Note    Event Date/Time   First MD Initiated Contact with Patient 04/18/22 2202     (approximate)   History   Leg Pain   HPI  Victoria [name] is a 61 y.o. female  here with bilateral leg pain. Pt reports that she was recently diagnosed with Lyme disease, has since had significant, severe bilateral leg and diffuse body pain. Over the past several days, she has had worsening pain along with what she describes is foul smelling dark urine. She has been trying to drink as much as she can. Reports that she has been taking oxycodone 15 mg without significant relief. She also has a h/o spinal stenosis. Reports she is being referred to a Rheumatologist but has not seen one yet.       Physical Exam   Triage Vital Signs: ED Triage Vitals  Enc Vitals Group     BP 04/18/22 1719 (!) 158/80     Pulse Rate 04/18/22 1719 78     Resp 04/18/22 1719 18     Temp 04/18/22 1719 (!) 97.5 F (36.4 C)     Temp Source 04/18/22 1719 Oral     SpO2 04/18/22 1719 95 %     Weight 04/18/22 1720 190 lb (86.2 kg)     Height 04/18/22 1720 5' 4"  (1.626 m)     Head Circumference --      Peak Flow --      Pain Score 04/18/22 1719 5     Pain Loc --      Pain Edu? --      Excl. in [place]? --     Most recent vital signs: Vitals:   04/18/22 2300 04/18/22 2330  BP: (!) 141/91 (!) 142/79  Pulse: 73 71  Resp:    Temp:    SpO2: 95% 95%     General: Awake, no distress.  CV:  Good peripheral perfusion. RRR. Resp:  Normal effort. Lungs CTAB. Abd:  No distention. No tenderness. Other:  No significant joint swelling or warmth.   ED Results / Procedures / Treatments   Labs (all labs ordered are listed, but only abnormal results are displayed) Labs Reviewed  COMPREHENSIVE METABOLIC PANEL - Abnormal; Notable for the following components:      Result Value   Glucose, Bld 119 (*)    ALT 48 (*)    All other components within normal limits  URINALYSIS, ROUTINE W  REFLEX MICROSCOPIC - Abnormal; Notable for the following components:   Color, Urine YELLOW (*)    APPearance HAZY (*)    Ketones, ur 5 (*)    All other components within normal limits  CBC WITH DIFFERENTIAL/PLATELET     EKG    RADIOLOGY    I also independently reviewed and agree with radiologist interpretations.   PROCEDURES:  Critical Care performed: No    MEDICATIONS ORDERED IN ED: Medications  predniSONE (DELTASONE) tablet 60 mg (60 mg Oral Given 04/18/22 2239)  HYDROmorphone (DILAUDID) injection 1 mg (1 mg Intramuscular Given 04/18/22 2241)  ketorolac (TORADOL) injection 60 mg (60 mg Intramuscular Given 04/18/22 2245)     IMPRESSION / MDM / ASSESSMENT AND PLAN / ED COURSE  I reviewed the triage vital signs and the nursing notes.                               This patient presents to the ED for concern  of leg pain, this involves an extensive number of treatment options, and is a complaint that carries with it a high risk of complications and morbidity.  The differential diagnosis includes arthralgias related to lyme/autoimmune arthritis, osteoarthritis, fibromyalgia, myalgias, neuropathy   Co morbidities that complicate the patient evaluation  Fibromyalgia Lyme disease GERD   Additional history obtained:  Additional history obtained from significant other External records from outside source obtained and reviewed including pain clinic notes   Lab Tests:  I Ordered, and personally interpreted labs.  The pertinent results include:   UA with ketonuria but unremarkable CMP unremarkable CBC without leukocytosis   Problem List / ED Course / Critical interventions / Medication management  Arthralgias This seems to be a somewhat acute on subacute/chronic issue related to Lyme and possibly underlying immune condition No focal warmth, redness or signs to suggest a septic arthritis or focal inflammatory arthritis Pt is neurovascularly intact, no leg  swelling or signs of DVT Pulses are symmetric with normal perfusion No fevers or signs of sepsis Normal lytes and wbc Will treat supportively and add prednisone for inflammatory component I ordered medication including dilaudid, toradol, prednisone  for pain, inflammation  Reevaluation of the patient after these medicines showed that the patient improved I have reviewed the patients home medicines and have made adjustments as needed   FINAL CLINICAL IMPRESSION(S) / ED DIAGNOSES   Final diagnoses:  Pain in both lower extremities  Lyme disease     Rx / DC Orders   ED Discharge Orders          Ordered    meloxicam (MOBIC) 15 MG tablet  Daily        04/18/22 2309    predniSONE (DELTASONE) 10 MG tablet  Daily        04/18/22 2309             Note:  This document was prepared using Dragon voice recognition software and may include unintentional dictation errors.   Duffy [name], MD 04/19/22 [phone]

## 2022-04-20 ENCOUNTER — Telehealth: Payer: Self-pay

## 2022-04-20 NOTE — Telephone Encounter (Signed)
Transition Care Management Follow-up Telephone Call Date of discharge and from where: 04/18/2022 at [facility] How have you been since you were released from the hospital? Better Any questions or concerns? No  Items Reviewed: Did the pt receive and understand the discharge instructions provided? Yes  Medications obtained and verified? Yes  Other? No  Any new allergies since your discharge? No  Dietary orders reviewed? Yes Do you have support at home? Yes   Home Care and Equipment/Supplies: Were home health services ordered? No If so, what is the name of the agency? N/A    Has the agency set up a time to come to the patient's home? No Were any new equipment or medical supplies ordered?  No What is the name of the medical supply agency? N/A Were you able to get the supplies/equipment? no Do you have any questions related to the use of the equipment or supplies? No  Functional Questionnaire: (I = Independent and D = Dependent) ADLs: I  Bathing/Dressing- I  Meal Prep- I  Eating- I  Maintaining continence- I  Transferring/Ambulation- I  Managing Meds- I  Follow up appointments reviewed:  PCP Hospital f/u appt confirmed? No   Specialist Hospital f/u appt confirmed? No  Referral placed to Rheumatology. Are transportation arrangements needed? No  If their condition worsens, is the pt aware to call PCP or go to the Emergency Dept.? Yes Was the patient provided with contact information for the PCP's office or ED? Yes Was to pt encouraged to call back with questions or concerns? Yes

## 2022-04-20 NOTE — Telephone Encounter (Signed)
Transition Care Management Unsuccessful Follow-up Telephone Call  Date of discharge and from where:  04/18/2022 from [facility]  Attempts:  1st Attempt  Reason for unsuccessful TCM follow-up call:  No answer/busy left vm to return my call.

## 2022-04-21 ENCOUNTER — Ambulatory Visit
Admission: RE | Admit: 2022-04-21 | Discharge: 2022-04-21 | Disposition: A | Discharge status: Home / Self Care | Payer: Medicare HMO | Source: Ambulatory Visit | Attending: Internal Medicine | Admitting: Internal Medicine

## 2022-04-21 DIAGNOSIS — N6321 Unspecified lump in the left breast, upper outer quadrant: Secondary | ICD-10-CM

## 2022-04-21 DIAGNOSIS — N6489 Other specified disorders of breast: Secondary | ICD-10-CM | POA: Diagnosis not present

## 2022-04-26 ENCOUNTER — Other Ambulatory Visit: Payer: Self-pay | Admitting: Internal Medicine

## 2022-04-26 ENCOUNTER — Telehealth: Payer: Self-pay | Admitting: Internal Medicine

## 2022-04-26 ENCOUNTER — Ambulatory Visit: Payer: Medicare HMO | Admitting: Internal Medicine

## 2022-04-26 DIAGNOSIS — R110 Nausea: Secondary | ICD-10-CM

## 2022-04-26 MED ORDER — ONDANSETRON HCL 8 MG PO TABS: 8.0000 mg | ORAL_TABLET | Freq: Three times a day (TID) | ORAL | 0 refills | Status: DC | PRN

## 2022-04-26 NOTE — Telephone Encounter (Signed)
Copied from [place] [id number]. Topic: General - Inquiry >> Apr 26, 2022 12:16 PM Rosanne Ashing P wrote: Reason for CRM: pt would like to get zofran for nausea.  [facility]

## 2022-05-10 ENCOUNTER — Other Ambulatory Visit: Payer: Self-pay | Admitting: Psychiatry

## 2022-05-10 ENCOUNTER — Encounter: Payer: Self-pay | Admitting: Internal Medicine

## 2022-05-10 ENCOUNTER — Ambulatory Visit (INDEPENDENT_AMBULATORY_CARE_PROVIDER_SITE_OTHER): Payer: Medicare HMO | Admitting: Internal Medicine

## 2022-05-10 VITALS — BP 132/80 | HR 77 | Temp 98.2°F | Resp 18 | Ht 64.25 in | Wt 197.9 lb

## 2022-05-10 DIAGNOSIS — K7581 Nonalcoholic steatohepatitis (NASH): Secondary | ICD-10-CM | POA: Diagnosis not present

## 2022-05-10 DIAGNOSIS — A6923 Arthritis due to Lyme disease: Secondary | ICD-10-CM | POA: Diagnosis not present

## 2022-05-10 DIAGNOSIS — R29898 Other symptoms and signs involving the musculoskeletal system: Secondary | ICD-10-CM | POA: Diagnosis not present

## 2022-05-10 MED ORDER — DOXYCYCLINE HYCLATE 100 MG PO TABS: 100.0000 mg | ORAL_TABLET | Freq: Two times a day (BID) | ORAL | 0 refills | Status: AC

## 2022-05-10 MED ORDER — GABAPENTIN 100 MG PO CAPS: 100.0000 mg | ORAL_CAPSULE | Freq: Three times a day (TID) | ORAL | 2 refills | Status: DC

## 2022-05-10 NOTE — Progress Notes (Signed)
Established Patient Office Visit  Subjective    Patient ID: Victoria [name], female    DOB: Jul 09, 1960  Age: 61 y.o. MRN: [id number]  CC:  Chief Complaint  Patient presents with   Follow-up    HPI Victoria [name] presents to for follow up. She is here with her husband.   Fibromyalgia/chronic pain: -Family history includes: sister with Lupus, daughter and granddaughter with Victoria [name], other sister diabetes inspidus, thyroid disease  -Dealing with severe pain for years, multiple joints with chronic pain and fatigue  -Labs from 8/23 showing ANA negative, inflammatory markers negative, TSH, free T3 and free T4 normal, RF normal. -Patient had positive Lyme's in August, since then has been having episodes of excruciating  pain where she has 10/10 immense pains all over her body. Feels like neuropathic/shock like pain. Appears worse in her legs but happens all over. Her legs are weak and will give out on her. Pain occurs in episodes that happen at least 10 times per day on a daily basis since August. Associated symptoms include intense hot flashes and headaches. She was treated for Lyme with Doxycycline for 21 days. She has a sister with chronic lyme and she wants to see a rheumatologist but the referral was denied. She was just started on Cymbalta 60 mg from her Psychiatrist which helps somewhat. She is also on gabapentin 100 mg TID and Oxycodone per pain management.    NASH: -Most recent LFTs 11/23 showing ALT 48, AST 36 -Was supposed to have a Fibroscan but moved and needs a new GI here   MDD: -Follows with Psychiatry -Mood status: fluctuating  -Current treatment: Cymbalta 60 mg, Trazodone 50 mg at night  -Symptom severity: severe  -Side effects: no Medication compliance: excellent compliance Previous psychiatric medications: has been on multiple and failed   Outpatient Encounter Medications as of 05/10/2022  Medication Sig   albuterol (VENTOLIN HFA) 108 (90 Base) MCG/ACT  inhaler Inhale 1-2 puffs into the lungs every 6 (six) hours as needed for wheezing or shortness of breath.   baclofen (LIORESAL) 10 MG tablet Take 10 mg by mouth 3 (three) times daily.   cholecalciferol (VITAMIN D) 25 MCG tablet Take 1 tablet (1,000 Units total) by mouth daily.   conjugated estrogens (PREMARIN) vaginal cream Premarin 0.625 mg/gram vaginal cream  INSERT INTO THE VAGINA EVERY OTHER DAY.   [START ON 05/22/2022] DULoxetine (CYMBALTA) 60 MG capsule Take 1 capsule (60 mg total) by mouth daily.   fluticasone (FLONASE) 50 MCG/ACT nasal spray SPRAY 1 SPRAY INTO EACH NOSTRIL EVERY DAY AS NEEDED   gabapentin (NEURONTIN) 100 MG capsule Take 1 capsule (100 mg total) by mouth 3 (three) times daily.   MELATONIN PO Take 10 mg by mouth at bedtime.   methylphenidate (RITALIN) 20 MG tablet TAKE 1/2-1 TABLET BY MOUTH 3 TIMES A DAY AS NEEDED   ondansetron (ZOFRAN) 8 MG tablet Take 1 tablet (8 mg total) by mouth every 8 (eight) hours as needed for nausea or vomiting.   oxyCODONE (ROXICODONE) 15 MG immediate release tablet Take 15 mg by mouth every 8 (eight) hours as needed.   pantoprazole (PROTONIX) 40 MG tablet Take 1 tablet (40 mg total) by mouth daily.   polyvinyl alcohol (LIQUIFILM TEARS) 1.4 % ophthalmic solution Place 1 drop into both eyes 3 (three) times daily.   lurasidone (LATUDA) 20 MG TABS tablet Take 1 tablet (20 mg total) by mouth daily.   [DISCONTINUED] DULoxetine (CYMBALTA) 30 MG capsule Take 1 capsule (30 mg total)  by mouth daily for 7 days.   No facility-administered encounter medications on file as of 05/10/2022.    Past Medical History:  Diagnosis Date   Asthma    Cervical spinal stenosis    Fatty liver    GERD (gastroesophageal reflux disease)    Hypercholesterolemia    Hypertension    Lumbar stenosis    Melanocarcinoma (HCC)    Osteoarthritis of both hips    Psychotic disorder (HCC)    Pulmonary fibrosis (HCC)    Sleep apnea     Past Surgical History:  Procedure  Laterality Date   ABDOMINAL HYSTERECTOMY     APPENDECTOMY     BREAST EXCISIONAL BIOPSY Right 1997   neg   HIATAL HERNIA REPAIR     Jan 2017   LAPAROSCOPY      Family History  Problem Relation Age of Onset   Alcohol abuse Mother    Arthritis Mother    Heart disease Mother    Liver disease Mother    Osteoporosis Mother    Clotting disorder Mother    Diabetes Father    Arthritis Father    Stroke Father    Sarcoidosis Father    Heart failure Father    Bipolar disorder Sister    Schizophrenia Sister    Depression Sister    Anxiety disorder Sister    Alcohol abuse Sister    Diabetes Sister    Arthritis Sister    Thyroid disease Sister    Lupus Sister    Depression Sister    Anxiety disorder Sister    Alcohol abuse Sister    Depression Sister    Anxiety disorder Sister    Ehlers-Danlos syndrome Daughter     Social History   Socioeconomic History   Marital status: Married    Spouse name: Victoria [name]   Number of children: 1   Years of education: Not on file   Highest education level: Associate degree: academic program  Occupational History   Occupation: home maker  Tobacco Use   Smoking status: Former   Smokeless tobacco: Never  Scientific laboratory technician Use: Never used  Substance and Sexual Activity   Alcohol use: No    Alcohol/week: 0.0 standard drinks of alcohol   Drug use: No    Comment: No use of illict drugs.   Sexual activity: Not Currently  Other Topics Concern   Not on file  Social History Narrative   Lives with husband at home.   Social Determinants of Health   Financial Resource Strain: Not on file  Food Insecurity: Not on file  Transportation Needs: Not on file  Physical Activity: Not on file  Stress: Not on file  Social Connections: Not on file  Intimate Partner Violence: Not on file    Review of Systems  Constitutional:  Positive for malaise/fatigue. Negative for chills and fever.  Respiratory:  Negative for shortness of breath.    Cardiovascular:  Negative for chest pain.  Gastrointestinal:  Negative for abdominal pain.  Musculoskeletal:  Positive for joint pain.  Neurological:  Positive for tingling, weakness and headaches. Negative for loss of consciousness.  All other systems reviewed and are negative.     Objective    BP 132/80   Pulse 77   Temp 98.2 F (36.8 C)   Resp 18   Ht 5' 4.25" (1.632 m)   Wt 197 lb 14.4 oz (89.8 kg)   SpO2 94%   BMI 33.71 kg/m   Physical Exam Constitutional:  Appearance: Normal appearance.  HENT:     Head: Normocephalic and atraumatic.     Mouth/Throat:     Mouth: Mucous membranes are moist.     Pharynx: Oropharynx is clear.  Eyes:     Extraocular Movements: Extraocular movements intact.     Conjunctiva/sclera: Conjunctivae normal.     Pupils: Pupils are equal, round, and reactive to light.  Cardiovascular:     Rate and Rhythm: Normal rate and regular rhythm.  Pulmonary:     Effort: Pulmonary effort is normal.     Breath sounds: Normal breath sounds.  Abdominal:     Comments: Epigastric pain to palpation  Musculoskeletal:     Right lower leg: No edema.     Left lower leg: No edema.  Skin:    General: Skin is warm and dry.  Neurological:     General: No focal deficit present.     Mental Status: She is alert. Mental status is at baseline.     Cranial Nerves: No cranial nerve deficit.     Sensory: No sensory deficit.     Motor: Weakness present.     Gait: Gait normal.     Comments: Difficult to assess - not fully participating in exam   Psychiatric:        Mood and Affect: Mood normal.        Behavior: Behavior normal.       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 I think the Lyme could have exacerbated her fibromyalgia as she has had chronic pain for some time. Cymbalta helping somewhat, continue Gabapentin 100 mg TID. Referral  to ID placed for ongoing Lyme symptoms, re-treat with Doxycyline for another 14 days.   - doxycycline (VIBRA-TABS) 100 MG tablet; Take 1 tablet (100 mg total) by mouth 2 (two) times daily for 14 days.  Dispense: 28 tablet; Refill: 0 - gabapentin (NEURONTIN) 100 MG capsule; Take 1 capsule (100 mg total) by mouth 3 (three) times daily.  Dispense: 90 capsule; Refill: 2 - Ambulatory referral to Infectious Disease  2. Nonalcoholic steatohepatitis: Referral to GI for NASH and fibroscan.   - Ambulatory referral to Gastroenterology  3. Transient weakness of left lower extremity: Referral to Neurology for EMG and transient lower extremity weakness.   - Ambulatory referral to Neurology   Return in 3 months (on 08/09/2022) for [facility] w/ nurse.   Teodora Medici, DO

## 2022-05-20 ENCOUNTER — Encounter: Payer: Self-pay | Admitting: Infectious Diseases

## 2022-05-20 ENCOUNTER — Other Ambulatory Visit
Admission: RE | Admit: 2022-05-20 | Discharge: 2022-05-20 | Disposition: A | Discharge status: Home / Self Care | Payer: Medicare HMO | Attending: Infectious Diseases | Admitting: Infectious Diseases

## 2022-05-20 ENCOUNTER — Ambulatory Visit: Payer: Medicare HMO | Attending: Infectious Diseases | Admitting: Infectious Diseases

## 2022-05-20 VITALS — BP 122/73 | HR 81 | Temp 97.3°F | Ht 64.0 in | Wt 195.0 lb

## 2022-05-20 DIAGNOSIS — A6920 Lyme disease, unspecified: Secondary | ICD-10-CM | POA: Diagnosis not present

## 2022-05-20 DIAGNOSIS — F32A Depression, unspecified: Secondary | ICD-10-CM | POA: Diagnosis not present

## 2022-05-20 DIAGNOSIS — M797 Fibromyalgia: Secondary | ICD-10-CM | POA: Insufficient documentation

## 2022-05-20 DIAGNOSIS — F411 Generalized anxiety disorder: Secondary | ICD-10-CM | POA: Insufficient documentation

## 2022-05-20 DIAGNOSIS — M791 Myalgia, unspecified site: Secondary | ICD-10-CM | POA: Insufficient documentation

## 2022-05-20 NOTE — Progress Notes (Signed)
NAME: Victoria [name]  DOB: 22-Aug-1960  MRN: [id number]  Date/Time: 05/20/2022 9:50 AM   Subjective:   ?pt is here to see me for abnormal lyme test. She is here with her husband Victoria [name] is a 61 y.o. female with a history of fibromyalgia had lyme test done and it was a direct western blot test and it revealed 2 IgG bands and 1 IgM band which was interpreted by her PCP as positive and she was treated with Doxy for 3 weeks in Aug/sept. As she continued to have generalized body ache, joint pains , fatigue she was restarted on Doxy on 05/10/22 and she is referred to me In th past she had a papular eruption on both her legs.  She has had tick bites in the past She also has had multiple lyme tests since 2009 and they all have been negative She has had melanoma excised from rt arm She had hysterectomy when she was 61 yrs old- she says she had b/l ovaries removed as well She has many animals ( 2 dogs 6 cats, 4 horses and chickens and 2 parakeets)  Past Medical History:  Diagnosis Date   Asthma    Cervical spinal stenosis    Fatty liver    GERD (gastroesophageal reflux disease)    Hypercholesterolemia    Hypertension    Lumbar stenosis    Melanocarcinoma ([place])    Osteoarthritis of both hips    Psychotic disorder ([place])    Pulmonary fibrosis ([place])    Sleep apnea     Past Surgical History:  Procedure Laterality Date   ABDOMINAL HYSTERECTOMY     APPENDECTOMY     BREAST EXCISIONAL BIOPSY Right 1997   neg   HIATAL HERNIA REPAIR     Jan 2017   LAPAROSCOPY      Social History   Socioeconomic History   Marital status: Married    Spouse name: bruce   Number of children: 1   Years of education: Not on file   Highest education level: Associate degree: academic program  Occupational History   Occupation: home maker  Tobacco Use   Smoking status: Former   Smokeless tobacco: Never  Scientific laboratory technician Use: Never used  Substance and Sexual Activity   Alcohol use: No     Alcohol/week: 0.0 standard drinks of alcohol   Drug use: No    Comment: No use of illict drugs.   Sexual activity: Not Currently  Other Topics Concern   Not on file  Social History Narrative   Lives with husband at home.   Social Determinants of Health   Financial Resource Strain: Not on file  Food Insecurity: Not on file  Transportation Needs: Not on file  Physical Activity: Not on file  Stress: Not on file  Social Connections: Not on file  Intimate Partner Violence: Not on file    Family History  Problem Relation Age of Onset   Alcohol abuse Mother    Arthritis Mother    Heart disease Mother    Liver disease Mother    Osteoporosis Mother    Clotting disorder Mother    Diabetes Father    Arthritis Father    Stroke Father    Sarcoidosis Father    Heart failure Father    Bipolar disorder Sister    Schizophrenia Sister    Depression Sister    Anxiety disorder Sister    Alcohol abuse Sister    Diabetes Sister    Arthritis Sister  Thyroid disease Sister    Lupus Sister    Depression Sister    Anxiety disorder Sister    Alcohol abuse Sister    Depression Sister    Anxiety disorder Sister    Ehlers-Danlos syndrome Daughter    Allergies  Allergen Reactions   Atorvastatin     Myalgias    Sertraline Hives and Shortness Of Breath   Amoxicillin    Aspirin    Cefuroxime Hives   Codeine    Hydrocodone Hives   Penicillins    Sulfa Antibiotics    I? Current Outpatient Medications  Medication Sig Dispense Refill   albuterol (VENTOLIN HFA) 108 (90 Base) MCG/ACT inhaler Inhale 1-2 puffs into the lungs every 6 (six) hours as needed for wheezing or shortness of breath. 18 g 1   baclofen (LIORESAL) 10 MG tablet Take 10 mg by mouth 3 (three) times daily.     cholecalciferol (VITAMIN D) 25 MCG tablet Take 1 tablet (1,000 Units total) by mouth daily. 30 tablet 1   conjugated estrogens (PREMARIN) vaginal cream Premarin 0.625 mg/gram vaginal cream  INSERT INTO THE VAGINA  EVERY OTHER DAY.     doxycycline (VIBRA-TABS) 100 MG tablet Take 1 tablet (100 mg total) by mouth 2 (two) times daily for 14 days. 28 tablet 0   [START ON 05/22/2022] DULoxetine (CYMBALTA) 60 MG capsule Take 1 capsule (60 mg total) by mouth daily. 90 capsule 0   fluticasone (FLONASE) 50 MCG/ACT nasal spray SPRAY 1 SPRAY INTO EACH NOSTRIL EVERY DAY AS NEEDED     gabapentin (NEURONTIN) 100 MG capsule Take 1 capsule (100 mg total) by mouth 3 (three) times daily. 90 capsule 2   MELATONIN PO Take 10 mg by mouth at bedtime.     ondansetron (ZOFRAN) 8 MG tablet Take 1 tablet (8 mg total) by mouth every 8 (eight) hours as needed for nausea or vomiting. 20 tablet 0   oxyCODONE (ROXICODONE) 15 MG immediate release tablet Take 15 mg by mouth every 8 (eight) hours as needed.     pantoprazole (PROTONIX) 40 MG tablet Take 1 tablet (40 mg total) by mouth daily. 30 tablet 1   polyvinyl alcohol (LIQUIFILM TEARS) 1.4 % ophthalmic solution Place 1 drop into both eyes 3 (three) times daily. 15 mL 1   No current facility-administered medications for this visit.     Abtx:  Anti-infectives (From admission, onward)    None       REVIEW OF SYSTEMS:  Const: negative fever, negative chills, but has had excess sweats, weight gain of 15 pounds Eyes: negative diplopia or visual changes, negative eye pain ENT: negative coryza, has had sore throat Resp: negative cough, hemoptysis, has dyspnea Cards: negative for chest pain, palpitations, lower extremity edema GU: negative for frequency, dysuria and hematuria GI: Negative for abdominal pain, diarrhea, bleeding, constipation Skin: as above Heme: negative for easy bruising and gum/nose bleeding MS: myalgias, arthralgias, back pain and muscle weakness Neurolo:negative for headaches, +dizziness, vertigo, memory problems, has had weakness transient of left lower extremity  Psych: anxiety, depression  Endocrine: negative for thyroid, diabetes, says sugar can be  low Allergy/Immunology- multiple allergies listed Objective:  VITALS:  BP 122/73   Pulse 81   Temp (!) 97.3 F (36.3 C) (Temporal)   Ht 5' 4"  (1.626 m)   Wt 195 lb (88.5 kg)   BMI 33.47 kg/m    PHYSICAL EXAM:  General: Alert, cooperative, no distress, appears young for  age.  Head: Normocephalic, without obvious abnormality,  atraumatic. Eyes: Conjunctivae clear, anicteric sclerae. Pupils are equal ENT Nares normal. No drainage or sinus tenderness. Lips, mucosa, and tongue normal. No Thrush cavities Neck: Supple, symmetrical, no adenopathy, thyroid: non tender no carotid bruit and no JVD. Back: No CVA tenderness. Lungs: Clear to auscultation bilaterally. No Wheezing or Rhonchi. No rales. Heart: Regular rate and rhythm, no murmur, rub or gallop. Abdomen: Soft, non-tender,not distended. Bowel sounds normal. No masses Extremities: atraumatic, no cyanosis. No edema. No clubbing No joint swelling Skin: dry skin, small scaly areas  Both great toe nails have some onycholysis  Lymph: Cervical, supraclavicular normal. Neurologic: Grossly non-focal Pertinent Labs Lab Results CBC    Component Value Date/Time   WBC 7.8 04/18/2022 1729   RBC 4.71 04/18/2022 1729   HGB 14.4 04/18/2022 1729   HCT 42.9 04/18/2022 1729   PLT 238 04/18/2022 1729   MCV 91.1 04/18/2022 1729   MCH 30.6 04/18/2022 1729   MCHC 33.6 04/18/2022 1729   RDW 13.2 04/18/2022 1729   LYMPHSABS 2.1 04/18/2022 1729   MONOABS 0.5 04/18/2022 1729   EOSABS 0.1 04/18/2022 1729   BASOSABS 0.0 04/18/2022 1729       Latest Ref Rng & Units 04/18/2022    5:29 PM 01/21/2022   11:21 AM 10/13/2021   12:12 PM  CMP  Glucose 70 - 99 mg/dL 119  88  87   BUN 8 - 23 mg/dL 19  16  15    Creatinine 0.44 - 1.00 mg/dL 0.85  0.96  0.87   Sodium 135 - 145 mmol/L 138  140  140   Potassium 3.5 - 5.1 mmol/L 4.3  4.4  4.7   Chloride 98 - 111 mmol/L 108  106  104   CO2 22 - 32 mmol/L 22  23  26    Calcium 8.9 - 10.3 mg/dL 9.4  9.1   9.7   Total Protein 6.5 - 8.1 g/dL 7.7  6.6  7.1   Total Bilirubin 0.3 - 1.2 mg/dL 0.6  0.5  0.6   Alkaline Phos 38 - 126 U/L 91     AST 15 - 41 U/L 36  31  39   ALT 0 - 44 U/L 48  27  57     BORRELIA BURGDORFERI ANTIBODY W REFLEX TO WESTERN BLOT Component 12/23/13 09/06/11 08/28/07 07/30/05  BORRELIA BURGDORFERI AB,SER,QL NEGATIVE NEGATIVE NEGATIVE NEGATIVE        Component Ref Range & Units 3 mo ago  Anaplasma Phagocytophilum DNA,QL Real Time PCR Not Detected Not Detected  Comment: . This test was developed and its analytical performance characteristics have been determined by [place], [place]. It has not been cleared or approved by the U.S. Food and Drug Administration. This assay has been validated pursuant to the CLIA regulations and is used for clinical purposes. .  Babesia Microti DNA,Real Time PCR Not Detected Not Detected  Comment: . This test was developed and its analytical performance characteristics have been determined by [place], [place]. It has not been cleared or approved by the U.S. Food and Drug Administration. This assay has been validated pursuant to the CLIA regulations and is used for clinical purposes. Marland Kitchen  BORRELIA MIYAMOTOI DNA, QL REAL TIME PCR Not Detected Not Detected  Comment: . This test detects but does not distinguish between B. miyamotoi and B. hermsii. . This test was developed and its analytical performance characteristics have been determined by [place], [place]. It has not been  cleared or approved by the U.S. Food and Drug Administration. This assay has been validated pursuant to the CLIA regulations and is used for clinical purposes. .  Ehrlichia Chaffeensis DNA Real Time PCR Not Detected Not Detected  Comment: . This test was developed and its analytical performance characteristics have been determined by [place], [place]. It has not been cleared or approved by the U.S. Food and Drug Administration. This assay has been validated pursuant to the CLIA regulations and is used for clinical purposes. Marland Kitchen  BORRELIA SPECIES DNA, QL REAL TIME PCR Not Detected Not Detected  Comment: . The Code of [place], Article 1 of Chapter 5 of Title 32.1, section 32.1-137.06, requires that the following language must be included on every Lyme disease test report issued by a [place] laboratory: Patients undergoing a Lyme disease test should be aware that Lyme disease tests vary and may produce results that are inaccurate. This means a patient may not be able to rely on a positive or negative result. Health care providers are encouraged to discuss Lyme disease test results with the patient for whom the test was ordered. . . For additional information, please refer to https://education.[facility].com/faq/faq224 (This link is being provided for informational/ educational purposes only.) . This test was developed and its analytical performance characteristics have been determined by [place], [place]. It has not been cleared or approved by the U.S. Food and Drug Administration. This assay has been validated pursuant to the CLIA regulations and is used for clinical purposes. .  Comment  see note  Comment: A negative result does not exclude Borrelia infection as the concentration of the organism in blood may be low or non-existent in patients with Lyme disease, and may depend on timing of specimen collection from onset of symptoms. Clinical correlation is recommended and additional studies such as serologic testing may be indicated.  Resulting Agency  [facility]/NICHOLS CHANTILLY            ? Impression/Recommendation ?Lyme test-- WB --2 IgG bands and 1 IgM-band  This is not a positive lyme test. Lyme test is a 2 tier test , an EIA will have to  be done first and then reflexed to WB Also to call it a positive test there should be 5 IgG bands and 2 IgM bands. She does not have lyme disease Will check a serology with reflex No need for antibiotics Pt would like to complete Doxy as she says she feels better with it. Explained to her that antibiotics if taken without a reason can lead to side effects including change in microbiome, cdiff, photosensitivity reactions, dizziness , candidiasis  Fibromyalgia  GAD and depression Weight gain-- Fatty liver   Nail dystrophy b/l great toes - r/o psoriasis  R/o fungal , with h/o melanoma ned to make sure the black color is not because of that- but as it is b/l this is less likely Asked her to check with her dermatologist ? ? ___________________________________________________ Discussed with patient, and her husband in great detail Communicated with Dr.Andrews as well Note:  This document was prepared using Dragon voice recognition software and may include unintentional dictation errors.

## 2022-05-20 NOTE — Patient Instructions (Addendum)
You have been referred by your PCP for lyme test- This looks like a false positive test -as the right test was not ordered- so would repeat the test today. Would recommend talking to your PCP about menopausal symptoms, your nails to r/o psoriasis  Will let you know of the resuls

## 2022-05-21 LAB — LYME DISEASE SEROLOGY W/REFLEX: Lyme Total Antibody EIA: NEGATIVE

## 2022-05-27 ENCOUNTER — Telehealth: Payer: Self-pay

## 2022-05-27 NOTE — Telephone Encounter (Signed)
-----   Message from Tsosie Billing, MD sent at 05/27/2022 12:53 PM EST ----- Can you please let her know that the Lyme test I did was negative. thx ----- Message ----- From: Buel Ream, Lab [facility] [place] Sent: 05/21/2022  12:36 PM EST To: Tsosie Billing, MD

## 2022-06-01 ENCOUNTER — Encounter: Payer: Self-pay | Admitting: Internal Medicine

## 2022-06-02 ENCOUNTER — Other Ambulatory Visit: Payer: Self-pay | Admitting: Internal Medicine

## 2022-06-02 DIAGNOSIS — A6923 Arthritis due to Lyme disease: Secondary | ICD-10-CM

## 2022-06-02 DIAGNOSIS — M797 Fibromyalgia: Secondary | ICD-10-CM

## 2022-06-02 MED ORDER — GABAPENTIN 100 MG PO CAPS: 200.0000 mg | ORAL_CAPSULE | Freq: Three times a day (TID) | ORAL | 2 refills | Status: DC

## 2022-06-08 ENCOUNTER — Ambulatory Visit: Payer: Medicare HMO | Admitting: Psychiatry

## 2022-06-10 ENCOUNTER — Ambulatory Visit: Payer: Medicare HMO | Admitting: Psychiatry

## 2022-06-14 ENCOUNTER — Other Ambulatory Visit: Payer: Self-pay | Admitting: Podiatry

## 2022-06-16 DIAGNOSIS — Z0289 Encounter for other administrative examinations: Secondary | ICD-10-CM | POA: Diagnosis not present

## 2022-06-16 DIAGNOSIS — Z79891 Long term (current) use of opiate analgesic: Secondary | ICD-10-CM | POA: Diagnosis not present

## 2022-06-16 DIAGNOSIS — Z760 Encounter for issue of repeat prescription: Secondary | ICD-10-CM | POA: Diagnosis not present

## 2022-06-16 DIAGNOSIS — G8929 Other chronic pain: Secondary | ICD-10-CM | POA: Diagnosis not present

## 2022-06-16 DIAGNOSIS — M5441 Lumbago with sciatica, right side: Secondary | ICD-10-CM | POA: Diagnosis not present

## 2022-06-16 DIAGNOSIS — M5412 Radiculopathy, cervical region: Secondary | ICD-10-CM | POA: Diagnosis not present

## 2022-06-16 DIAGNOSIS — M5442 Lumbago with sciatica, left side: Secondary | ICD-10-CM | POA: Diagnosis not present

## 2022-06-16 DIAGNOSIS — Z9181 History of falling: Secondary | ICD-10-CM | POA: Insufficient documentation

## 2022-06-18 ENCOUNTER — Other Ambulatory Visit: Payer: Self-pay | Admitting: Internal Medicine

## 2022-06-18 ENCOUNTER — Encounter: Payer: Self-pay | Admitting: Internal Medicine

## 2022-06-18 DIAGNOSIS — R110 Nausea: Secondary | ICD-10-CM

## 2022-06-18 MED ORDER — ONDANSETRON HCL 8 MG PO TABS: 8.0000 mg | ORAL_TABLET | Freq: Three times a day (TID) | ORAL | 0 refills | Status: DC | PRN

## 2022-06-21 NOTE — Psych (Signed)
CCA was completed via phone. Pt declined PHP.  [employer]
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
## 2022-06-24 ENCOUNTER — Ambulatory Visit: Payer: Medicare HMO | Admitting: Psychiatry

## 2022-07-06 DIAGNOSIS — Z8679 Personal history of other diseases of the circulatory system: Secondary | ICD-10-CM | POA: Diagnosis not present

## 2022-07-06 DIAGNOSIS — G959 Disease of spinal cord, unspecified: Secondary | ICD-10-CM | POA: Diagnosis not present

## 2022-07-06 DIAGNOSIS — G894 Chronic pain syndrome: Secondary | ICD-10-CM | POA: Diagnosis not present

## 2022-07-12 ENCOUNTER — Other Ambulatory Visit: Payer: Self-pay | Admitting: Neurology

## 2022-07-12 DIAGNOSIS — Z8679 Personal history of other diseases of the circulatory system: Secondary | ICD-10-CM

## 2022-07-15 ENCOUNTER — Ambulatory Visit: Admission: RE | Admit: 2022-07-15 | Payer: Medicare HMO | Source: Ambulatory Visit

## 2022-07-16 ENCOUNTER — Telehealth: Payer: Self-pay | Admitting: Psychiatry

## 2022-07-16 NOTE — Telephone Encounter (Signed)
Could you please reach out to the patient and inquire if she is interested in trying medication for her anxiety, as suggested by her husband? If she is open to it, she can consider using hydroxyzine on an as-needed basis. Please let me know if she is interested. Thanks!

## 2022-07-16 NOTE — Telephone Encounter (Signed)
Patient husband called to schedule her appointment. It is for 09-14-22. He mentioned that she is more anxious than normal and is wondering if she can have anxiety medicine. Please advise

## 2022-07-19 ENCOUNTER — Telehealth: Payer: Self-pay

## 2022-07-19 NOTE — Telephone Encounter (Signed)
Spoke to the patients spouse he stated that she had taken the Hydroxyzine before but he was not sure how it worked for she was still sleeping he will call the office once he speaks with her about it to let Dr. Modesta Messing know if she is going to try the medication.

## 2022-07-19 NOTE — Telephone Encounter (Signed)
I assume he is referring to her prior experience with hydroxyzine. I would not recommend Xanax. We will discuss this further at her next visit. Additionally, please encourage the patient to communicate with [place] for any further questions or concerns rather than him contacting us directly.

## 2022-07-19 NOTE — Telephone Encounter (Signed)
pt husband called left a message states that the hydroxzine is not working for her and she wants to have xanax

## 2022-07-19 NOTE — Telephone Encounter (Signed)
Pt last seen on 04-15-22 next appt 09-14-22

## 2022-07-19 NOTE — Telephone Encounter (Signed)
left message asking patient to call our office. no other information was left on message.

## 2022-07-19 NOTE — Telephone Encounter (Signed)
tried to call no answer no message could be left. mailbox full

## 2022-07-21 NOTE — Telephone Encounter (Signed)
pt husband made appt for tomorrow.

## 2022-07-22 ENCOUNTER — Encounter: Payer: Self-pay | Admitting: Psychiatry

## 2022-07-22 ENCOUNTER — Ambulatory Visit: Payer: Medicare HMO | Admitting: Psychiatry

## 2022-07-22 VITALS — BP 142/83 | HR 86 | Temp 98.1°F | Ht 64.0 in | Wt 205.8 lb

## 2022-07-22 DIAGNOSIS — F419 Anxiety disorder, unspecified: Secondary | ICD-10-CM

## 2022-07-22 DIAGNOSIS — F4310 Post-traumatic stress disorder, unspecified: Secondary | ICD-10-CM | POA: Diagnosis not present

## 2022-07-22 DIAGNOSIS — F331 Major depressive disorder, recurrent, moderate: Secondary | ICD-10-CM | POA: Diagnosis not present

## 2022-07-22 MED ORDER — HYDROXYZINE HCL 25 MG PO TABS: 25.0000 mg | ORAL_TABLET | Freq: Every day | ORAL | 1 refills | Status: DC | PRN

## 2022-07-22 MED ORDER — DULOXETINE HCL 60 MG PO CPEP: 60.0000 mg | ORAL_CAPSULE | Freq: Every day | ORAL | 0 refills | Status: DC

## 2022-07-22 NOTE — Addendum Note (Signed)
Addended by: Norman Clay on: 07/22/2022 03:08 PM   Modules accepted: Level of Service

## 2022-07-22 NOTE — Patient Instructions (Signed)
Continue duloxetine 60 mg daily  Start hydroxyzine 25 mg daily as needed for anxiety  Next appointment: 4/22 at 3 PM
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
## 2022-07-26 ENCOUNTER — Ambulatory Visit
Admission: RE | Admit: 2022-07-26 | Discharge: 2022-07-26 | Disposition: A | Discharge status: Home / Self Care | Payer: Medicare HMO | Source: Ambulatory Visit | Attending: Neurology | Admitting: Neurology

## 2022-07-26 DIAGNOSIS — R519 Headache, unspecified: Secondary | ICD-10-CM | POA: Diagnosis not present

## 2022-07-26 DIAGNOSIS — Z8679 Personal history of other diseases of the circulatory system: Secondary | ICD-10-CM | POA: Insufficient documentation

## 2022-07-26 MED ORDER — GADOBUTROL 1 MMOL/ML IV SOLN
9.0000 mL | Freq: Once | INTRAVENOUS | Status: AC | PRN
  Administered 2022-07-26: 9 mL via INTRAVENOUS

## 2022-08-03 DIAGNOSIS — R200 Anesthesia of skin: Secondary | ICD-10-CM | POA: Diagnosis not present

## 2022-08-15 ENCOUNTER — Other Ambulatory Visit: Payer: Self-pay | Admitting: Psychiatry

## 2022-08-19 ENCOUNTER — Ambulatory Visit (INDEPENDENT_AMBULATORY_CARE_PROVIDER_SITE_OTHER): Payer: Medicare HMO

## 2022-08-19 ENCOUNTER — Telehealth: Payer: Self-pay

## 2022-08-19 VITALS — Ht 64.5 in | Wt 195.0 lb

## 2022-08-19 DIAGNOSIS — Z0000 Encounter for general adult medical examination without abnormal findings: Secondary | ICD-10-CM

## 2022-08-19 DIAGNOSIS — Z789 Other specified health status: Secondary | ICD-10-CM

## 2022-08-19 NOTE — Progress Notes (Signed)
I connected with  [name] on 08/19/22 by a audio enabled telemedicine application and verified that I am speaking with the correct person using two identifiers.  Patient Location: Home  Provider Location: Office/Clinic  I discussed the limitations of evaluation and management by telemedicine. The patient expressed understanding and agreed to proceed.  Subjective:   Victoria [name] is a 62 y.o. female who presents for Medicare Annual (Subsequent) preventive examination.  Review of Systems    Cardiac Risk Factors include: obesity (BMI >30kg/m2);sedentary lifestyle    Objective:    Today's Vitals   08/19/22 1338 08/19/22 1339  Weight: 195 lb (88.5 kg)   Height: 5' 4.5" (1.638 m)   PainSc:  8    Body mass index is 32.95 kg/m.     08/19/2022    1:51 PM 11/19/2019    1:10 PM 11/14/2019   11:45 PM 11/14/2019   12:53 PM 07/07/2015   11:43 PM 07/05/2015    8:58 PM 07/05/2015    5:42 PM  Advanced Directives  Does Patient Have a Medical Advance Directive? No   No  No No  Would patient like information on creating a medical advance directive?      No - patient declined information No - patient declined information     Information is confidential and restricted. Go to Review Flowsheets to unlock data.    Current Medications (verified) Outpatient Encounter Medications as of 08/19/2022  Medication Sig   albuterol (VENTOLIN HFA) 108 (90 Base) MCG/ACT inhaler Inhale 1-2 puffs into the lungs every 6 (six) hours as needed for wheezing or shortness of breath.   baclofen (LIORESAL) 10 MG tablet Take 10 mg by mouth 3 (three) times daily.   cholecalciferol (VITAMIN D) 25 MCG tablet Take 1 tablet (1,000 Units total) by mouth daily.   conjugated estrogens (PREMARIN) vaginal cream Premarin 0.625 mg/gram vaginal cream  INSERT INTO THE VAGINA EVERY OTHER DAY.   [START ON 08/20/2022] DULoxetine (CYMBALTA) 60 MG capsule Take 1 capsule (60 mg total) by mouth daily.   fluticasone (FLONASE) 50  MCG/ACT nasal spray SPRAY 1 SPRAY INTO EACH NOSTRIL EVERY DAY AS NEEDED   [START ON 08/21/2022] hydrOXYzine (ATARAX) 25 MG tablet Take 1 tablet (25 mg total) by mouth daily as needed. for anxiety   MELATONIN PO Take 10 mg by mouth at bedtime.   meloxicam (MOBIC) 15 MG tablet Take 15 mg by mouth daily.   ondansetron (ZOFRAN) 8 MG tablet Take 1 tablet (8 mg total) by mouth every 8 (eight) hours as needed for nausea or vomiting.   oxyCODONE (ROXICODONE) 15 MG immediate release tablet Take 15 mg by mouth every 8 (eight) hours as needed.   pantoprazole (PROTONIX) 40 MG tablet Take 1 tablet (40 mg total) by mouth daily.   polyvinyl alcohol (LIQUIFILM TEARS) 1.4 % ophthalmic solution Place 1 drop into both eyes 3 (three) times daily.   pregabalin (LYRICA) 75 MG capsule Take 75 mg by mouth in the morning, at noon, in the evening, and at bedtime.   No facility-administered encounter medications on file as of 08/19/2022.    Allergies (verified) Atorvastatin, Sertraline, Amoxicillin, Aspirin, Cefuroxime, Codeine, Hydrocodone, Penicillins, and Sulfa antibiotics   History: Past Medical History:  Diagnosis Date   Asthma    Cervical spinal stenosis    Fatty liver    GERD (gastroesophageal reflux disease)    Hypercholesterolemia    Hypertension    Lumbar stenosis    Melanocarcinoma ([place])    Osteoarthritis of both hips  Psychotic disorder ([place])    Pulmonary fibrosis (HCC)    Sleep apnea    Past Surgical History:  Procedure Laterality Date   ABDOMINAL HYSTERECTOMY     APPENDECTOMY     BREAST EXCISIONAL BIOPSY Right 1997   neg   HIATAL HERNIA REPAIR     Jan 2017   LAPAROSCOPY     Family History  Problem Relation Age of Onset   Alcohol abuse Mother    Arthritis Mother    Heart disease Mother    Liver disease Mother    Osteoporosis Mother    Clotting disorder Mother    Diabetes Father    Arthritis Father    Stroke Father    Sarcoidosis Father    Heart failure Father    Bipolar  disorder Sister    Schizophrenia Sister    Depression Sister    Anxiety disorder Sister    Alcohol abuse Sister    Diabetes Sister    Arthritis Sister    Thyroid disease Sister    Lupus Sister    Depression Sister    Anxiety disorder Sister    Alcohol abuse Sister    Depression Sister    Anxiety disorder Sister    Ehlers-Danlos syndrome Daughter    Social History   Socioeconomic History   Marital status: Married    Spouse name: bruce   Number of children: 1   Years of education: Not on file   Highest education level: Associate degree: academic program  Occupational History   Occupation: home maker  Tobacco Use   Smoking status: Former   Smokeless tobacco: Never  Scientific laboratory technician Use: Never used  Substance and Sexual Activity   Alcohol use: No    Alcohol/week: 0.0 standard drinks of alcohol   Drug use: No    Comment: No use of illict drugs.   Sexual activity: Not Currently  Other Topics Concern   Not on file  Social History Narrative   Lives with husband at home.   Social Determinants of Health   Financial Resource Strain: Low Risk  (08/19/2022)   Overall Financial Resource Strain (CARDIA)    Difficulty of Paying Living Expenses: Not hard at all  Food Insecurity: No Food Insecurity (08/19/2022)   Hunger Vital Sign    Worried About Running Out of Food in the Last Year: Never true    Ran Out of Food in the Last Year: Never true  Transportation Needs: No Transportation Needs (08/19/2022)   PRAPARE - Hydrologist (Medical): No    Lack of Transportation (Non-Medical): No  Physical Activity: Inactive (08/19/2022)   Exercise Vital Sign    Days of Exercise per Week: 0 days    Minutes of Exercise per Session: 0 min  Stress: Stress Concern Present (08/19/2022)   [place]    Feeling of Stress : To some extent  Social Connections: Moderately Isolated (08/19/2022)   Social  Connection and Isolation Panel [NHANES]    Frequency of Communication with Friends and Family: More than three times a week    Frequency of Social Gatherings with Friends and Family: Never    Attends Religious Services: Never    Marine scientist or Organizations: No    Attends Archivist Meetings: Never    Marital Status: Married    Tobacco Counseling Counseling given: Not Answered   Clinical Intake:  Pre-visit preparation completed: Yes  Pain :  0-10 Pain Score: 8  Pain Type: Chronic pain Pain Location: Back Pain Orientation: Lower Pain Descriptors / Indicators: Aching, Sharp Pain Onset: More than a month ago Pain Frequency: Constant Pain Relieving Factors: pain medications Effect of Pain on Daily Activities: cant walk/exercise, standing, uses canes  Pain Relieving Factors: pain medications  BMI - recorded: 32.95 Nutritional Status: BMI > 30  Obese Nutritional Risks: Nausea/ vomitting/ diarrhea Diabetes: No  How often do you need to have someone help you when you read instructions, pamphlets, or other written materials from your doctor or pharmacy?: 1 - Never  Diabetic?no  Interpreter Needed?: No  Comments: lives with spouse Information entered by :: B.Jemar Paulsen,LPN   Activities of Daily Living    08/19/2022    1:52 PM 05/10/2022    2:12 PM  In your present state of health, do you have any difficulty performing the following activities:  Hearing? 0 0  Vision? 0 1  Difficulty concentrating or making decisions? 0 1  Walking or climbing stairs? 1 1  Dressing or bathing? 0 1  Doing errands, shopping? 0 1  Preparing Food and eating ? N   Using the Toilet? N   In the past six months, have you accidently leaked urine? Y   Do you have problems with loss of bowel control? N   Managing your Medications? N   Managing your Finances? Y   Housekeeping or managing your Housekeeping? Y     Patient Care Team: Teodora Medici, DO as PCP - General  (Internal Medicine)  Indicate any recent Medical Services you may have received from other than [facility] providers in the past year (date may be approximate).     Assessment:   This is a routine wellness examination for Sinae.  Hearing/Vision screen Hearing Screening - Comments:: Adequate hearing Vision Screening - Comments:: Adequate vision:readers [facility]  Dietary issues and exercise activities discussed: Current Exercise Habits: The patient does not participate in regular exercise at present, Exercise limited by: orthopedic condition(s);neurologic condition(s);cardiac condition(s)   Goals Addressed   None    Depression Screen    08/19/2022    1:48 PM 07/22/2022    2:00 PM 05/20/2022    9:53 AM 05/10/2022    2:12 PM 04/15/2022    9:31 AM 03/04/2022   11:22 AM 03/01/2022   10:01 AM  PHQ 2/9 Scores  PHQ - 2 Score 2  1 6   0   PHQ- 9 Score 12  1 21   0      Information is confidential and restricted. Go to Review Flowsheets to unlock data.    Fall Risk    08/19/2022    1:46 PM 05/20/2022    9:52 AM 05/10/2022    2:12 PM 03/04/2022   11:22 AM 02/08/2022    1:48 PM  Fall Risk   Falls in the past year? 0 0 1 0 0  Number falls in past yr: 0  0 0 0  Injury with Fall? 0  0 0 0  Risk for fall due to : No Fall Risks No Fall Risks     Follow up Education provided;Falls prevention discussed Falls evaluation completed       FALL RISK PREVENTION PERTAINING TO THE HOME:  Any stairs in or around the home? Yes  If so, are there any without handrails? Yes  Home free of loose throw rugs in walkways, pet beds, electrical cords, etc? Yes  Adequate lighting in your home to reduce risk of falls? Yes  ASSISTIVE DEVICES UTILIZED TO PREVENT FALLS:  Life alert? No  Use of a cane, walker or w/c? Yes  Grab bars in the bathroom? No  Shower chair or bench in shower? No  Elevated toilet seat or a handicapped toilet? No   Cognitive Function:        08/19/2022    3:07 PM  6CIT Screen   What Year? 0 points  What month? 0 points  What time? 0 points  Count back from 20 0 points  Months in reverse 0 points  Repeat phrase 0 points  Total Score 0 points    Immunizations Immunization History  Administered Date(s) Administered   Influenza Inj Mdck Quad Pf 06/01/2016   Influenza,inj,Quad PF,6+ Mos 06/01/2016, 03/15/2017, 04/18/2018   Influenza,inj,quad, With Preservative 06/01/2016, 03/15/2017, 04/18/2018   Influenza-Unspecified 04/14/1995, 02/07/2020   PFIZER(Purple Top)SARS-COV-2 Vaccination 08/21/2019, 09/14/2019   Pfizer Covid-19 Vaccine Bivalent Booster 32yrs & up 02/18/2021   Pneumococcal Conjugate-13 01/15/2021   Pneumococcal Polysaccharide-23 06/01/2015   Td 12/10/2002   Tdap 09/27/2014    TDAP status: Up to date  Flu Vaccine status: Up to date  Pneumococcal vaccine status: Up to date  Covid-19 vaccine status: Declined, Education has been provided regarding the importance of this vaccine but patient still declined. Advised may receive this vaccine at local pharmacy or Health Dept.or vaccine clinic. Aware to provide a copy of the vaccination record if obtained from local pharmacy or Health Dept. Verbalized acceptance and understanding.  Qualifies for Shingles Vaccine? Yes   Zostavax completed No   Shingrix Completed?: No.    Education has been provided regarding the importance of this vaccine. Patient has been advised to call insurance company to determine out of pocket expense if they have not yet received this vaccine. Advised may also receive vaccine at local pharmacy or Health Dept. Verbalized acceptance and understanding.  Screening Tests Health Maintenance  Topic Date Due   Zoster Vaccines- Shingrix (1 of 2) Never done   INFLUENZA VACCINE  12/29/2021   COVID-19 Vaccine (4 - 2023-24 season) 01/29/2022   Medicare Annual Wellness (AWV)  08/19/2023   MAMMOGRAM  04/21/2024   DTaP/Tdap/Td (3 - Td or Tdap) 09/26/2024   COLONOSCOPY (Pts 45-34yrs  Insurance coverage will need to be confirmed)  01/19/2026   Hepatitis C Screening  Completed   HIV Screening  Completed   HPV VACCINES  Aged Out   PAP SMEAR-Modifier  Discontinued    Health Maintenance  Health Maintenance Due  Topic Date Due   Zoster Vaccines- Shingrix (1 of 2) Never done   INFLUENZA VACCINE  12/29/2021   COVID-19 Vaccine (4 - 2023-24 season) 01/29/2022    Colorectal cancer screening: Type of screening: Colonoscopy. Completed yes. Repeat every 10 years  Mammogram status: Completed yes. Repeat every year  Bone Density-Never done  Lung Cancer Screening: (Low Dose CT Chest recommended if Age 23-80 years, 30 pack-year currently smoking OR have quit w/in 15years.) does not qualify.   Lung Cancer Screening Referral: no  Additional Screening:  Hepatitis C Screening: does not qualify; Completed yes  Vision Screening: Recommended annual ophthalmology exams for early detection of glaucoma and other disorders of the eye. Is the patient up to date with their annual eye exam?  Yes  Who is the provider or what is the name of the office in which the patient attends annual eye exams? [facility] If pt is not established with a provider, would they like to be referred to a provider to establish care? No .  Dental Screening: Recommended annual dental exams for proper oral hygiene  Community Resource Referral / Chronic Care Management: CRR required this visit?  yes SW  CCM required this visit?  No     Plan:     I have personally reviewed and noted the following in the patient's chart:   Medical and social history Use of alcohol, tobacco or illicit drugs  Current medications and supplements including opioid prescriptions. Patient is currently taking opioid prescriptions. Information provided to patient regarding non-opioid alternatives. Patient advised to discuss non-opioid treatment plan with their provider. Functional ability and status Nutritional  status Physical activity Advanced directives List of other physicians Hospitalizations, surgeries, and ER visits in previous 12 months Vitals Screenings to include cognitive, depression, and falls Referrals and appointments  In addition, I have reviewed and discussed with patient certain preventive protocols, quality metrics, and best practice recommendations. A written personalized care plan for preventive services as well as general preventive health recommendations were provided to patient.     Roger Shelter, LPN   [id number]   Nurse Notes: pt states she is in constant pain: medications take "the edge off'. She says she experiences anxiety and the prescribed Hydroxyzine is not helping.  *Pt requests updated disability form for DMV* Pt relays she needs help with cleaning and household management. Referral made for SW/new assessment

## 2022-08-19 NOTE — Patient Instructions (Signed)
Victoria [name] , Thank you for taking time to come for your Medicare Wellness Visit. I appreciate your ongoing commitment to your health goals. Please review the following plan we discussed and let me know if I can assist you in the future.   These are the goals we discussed:  Goals   None     This is a list of the screening recommended for you and due dates:  Health Maintenance  Topic Date Due   Zoster (Shingles) Vaccine (1 of 2) Never done   Flu Shot  12/29/2021   COVID-19 Vaccine (4 - 2023-24 season) 01/29/2022   Medicare Annual Wellness Visit  08/19/2023   Mammogram  04/21/2024   DTaP/Tdap/Td vaccine (3 - Td or Tdap) 09/26/2024   Colon Cancer Screening  01/19/2026   Hepatitis C Screening: USPSTF Recommendation to screen - Ages 18-79 yo.  Completed   HIV Screening  Completed   HPV Vaccine  Aged Out   Pap Smear  Discontinued    Advanced directives: no  Conditions/risks identified: falls risk  Next appointment: Follow up in one year for your annual wellness visit. 08/25/2023 @1 :30pm telephone  Preventive Care 40-64 Years, Female Preventive care refers to lifestyle choices and visits with your health care provider that can promote health and wellness. What does preventive care include? A yearly physical exam. This is also called an annual well check. Dental exams once or twice a year. Routine eye exams. Ask your health care provider how often you should have your eyes checked. Personal lifestyle choices, including: Daily care of your teeth and gums. Regular physical activity. Eating a healthy diet. Avoiding tobacco and drug use. Limiting alcohol use. Practicing safe sex. Taking low-dose aspirin daily starting at age 29. Taking vitamin and mineral supplements as recommended by your health care provider. What happens during an annual well check? The services and screenings done by your health care provider during your annual well check will depend on your age, overall  health, lifestyle risk factors, and family history of disease. Counseling  Your health care provider may ask you questions about your: Alcohol use. Tobacco use. Drug use. Emotional well-being. Home and relationship well-being. Sexual activity. Eating habits. Work and work Statistician. Method of birth control. Menstrual cycle. Pregnancy history. Screening  You may have the following tests or measurements: Height, weight, and BMI. Blood pressure. Lipid and cholesterol levels. These may be checked every 5 years, or more frequently if you are over 71 years old. Skin check. Lung cancer screening. You may have this screening every year starting at age 65 if you have a 30-pack-year history of smoking and currently smoke or have quit within the past 15 years. Fecal occult blood test (FOBT) of the stool. You may have this test every year starting at age 32. Flexible sigmoidoscopy or colonoscopy. You may have a sigmoidoscopy every 5 years or a colonoscopy every 10 years starting at age 74. Hepatitis C blood test. Hepatitis B blood test. Sexually transmitted disease (STD) testing. Diabetes screening. This is done by checking your blood sugar (glucose) after you have not eaten for a while (fasting). You may have this done every 1-3 years. Mammogram. This may be done every 1-2 years. Talk to your health care provider about when you should start having regular mammograms. This may depend on whether you have a family history of breast cancer. BRCA-related cancer screening. This may be done if you have a family history of breast, ovarian, tubal, or peritoneal cancers. Pelvic exam and  Pap test. This may be done every 3 years starting at age 57. Starting at age 60, this may be done every 5 years if you have a Pap test in combination with an HPV test. Bone density scan. This is done to screen for osteoporosis. You may have this scan if you are at high risk for osteoporosis. Discuss your test results,  treatment options, and if necessary, the need for more tests with your health care provider. Vaccines  Your health care provider may recommend certain vaccines, such as: Influenza vaccine. This is recommended every year. Tetanus, diphtheria, and acellular pertussis (Tdap, Td) vaccine. You may need a Td booster every 10 years. Zoster vaccine. You may need this after age 27. Pneumococcal 13-valent conjugate (PCV13) vaccine. You may need this if you have certain conditions and were not previously vaccinated. Pneumococcal polysaccharide (PPSV23) vaccine. You may need one or two doses if you smoke cigarettes or if you have certain conditions. Talk to your health care provider about which screenings and vaccines you need and how often you need them. This information is not intended to replace advice given to you by your health care provider. Make sure you discuss any questions you have with your health care provider. Document Released: 06/13/2015 Document Revised: 02/04/2016 Document Reviewed: 03/18/2015 Elsevier Interactive Patient Education  2017 [place] Prevention in the Home Falls can cause injuries. They can happen to people of all ages. There are many things you can do to make your home safe and to help prevent falls. What can I do on the outside of my home? Regularly fix the edges of walkways and driveways and fix any cracks. Remove anything that might make you trip as you walk through a door, such as a raised step or threshold. Trim any bushes or trees on the path to your home. Use bright outdoor lighting. Clear any walking paths of anything that might make someone trip, such as rocks or tools. Regularly check to see if handrails are loose or broken. Make sure that both sides of any steps have handrails. Any raised decks and porches should have guardrails on the edges. Have any leaves, snow, or ice cleared regularly. Use sand or salt on walking paths during winter. Clean  up any spills in your garage right away. This includes oil or grease spills. What can I do in the bathroom? Use night lights. Install grab bars by the toilet and in the tub and shower. Do not use towel bars as grab bars. Use non-skid mats or decals in the tub or shower. If you need to sit down in the shower, use a plastic, non-slip stool. Keep the floor dry. Clean up any water that spills on the floor as soon as it happens. Remove soap buildup in the tub or shower regularly. Attach bath mats securely with double-sided non-slip rug tape. Do not have throw rugs and other things on the floor that can make you trip. What can I do in the bedroom? Use night lights. Make sure that you have a light by your bed that is easy to reach. Do not use any sheets or blankets that are too big for your bed. They should not hang down onto the floor. Have a firm chair that has side arms. You can use this for support while you get dressed. Do not have throw rugs and other things on the floor that can make you trip. What can I do in the kitchen? Clean up any  spills right away. Avoid walking on wet floors. Keep items that you use a lot in easy-to-reach places. If you need to reach something above you, use a strong step stool that has a grab bar. Keep electrical cords out of the way. Do not use floor polish or wax that makes floors slippery. If you must use wax, use non-skid floor wax. Do not have throw rugs and other things on the floor that can make you trip. What can I do with my stairs? Do not leave any items on the stairs. Make sure that there are handrails on both sides of the stairs and use them. Fix handrails that are broken or loose. Make sure that handrails are as long as the stairways. Check any carpeting to make sure that it is firmly attached to the stairs. Fix any carpet that is loose or worn. Avoid having throw rugs at the top or bottom of the stairs. If you do have throw rugs, attach them to the  floor with carpet tape. Make sure that you have a light switch at the top of the stairs and the bottom of the stairs. If you do not have them, ask someone to add them for you. What else can I do to help prevent falls? Wear shoes that: Do not have high heels. Have rubber bottoms. Are comfortable and fit you well. Are closed at the toe. Do not wear sandals. If you use a stepladder: Make sure that it is fully opened. Do not climb a closed stepladder. Make sure that both sides of the stepladder are locked into place. Ask someone to hold it for you, if possible. Clearly mark and make sure that you can see: Any grab bars or handrails. First and last steps. Where the edge of each step is. Use tools that help you move around (mobility aids) if they are needed. These include: Canes. Walkers. Scooters. Crutches. Turn on the lights when you go into a dark area. Replace any light bulbs as soon as they burn out. Set up your furniture so you have a clear path. Avoid moving your furniture around. If any of your floors are uneven, fix them. If there are any pets around you, be aware of where they are. Review your medicines with your doctor. Some medicines can make you feel dizzy. This can increase your chance of falling. Ask your doctor what other things that you can do to help prevent falls. This information is not intended to replace advice given to you by your health care provider. Make sure you discuss any questions you have with your health care provider. Document Released: 03/13/2009 Document Revised: 10/23/2015 Document Reviewed: 06/21/2014 Elsevier Interactive Patient Education  2017 [employer].

## 2022-08-19 NOTE — Telephone Encounter (Signed)
Message sent to PCP (E. Andrews,DO) of pt request for updated disability form.

## 2022-08-21 ENCOUNTER — Other Ambulatory Visit: Payer: Self-pay | Admitting: Internal Medicine

## 2022-08-23 NOTE — Telephone Encounter (Signed)
Requested medications are due for refill today.  yes  Requested medications are on the active medications list.  yes  Last refill. 11/20/2019 #30 1 rf  Future visit scheduled.  no  Notes to clinic.  John Passenger transport manager.    Requested Prescriptions  Pending Prescriptions Disp Refills   pantoprazole (PROTONIX) 40 MG tablet [Pharmacy Med Name: PANTOPRAZOLE SOD DR 40 MG TAB] 90 tablet 3    Sig: TAKE 1 TABLET BY MOUTH EVERY DAY IN THE MORNING     Gastroenterology: Proton Pump Inhibitors Passed - 08/21/2022  8:39 AM      Passed - Valid encounter within last 12 months    Recent Outpatient Visits           3 months ago Lyme arthritis [facility])   [facility] Teodora Medici, DO   5 months ago Mass of upper outer quadrant of left breast   [facility] Teodora Medici, DO   6 months ago Erythema migrans (Lyme disease)   [facility] Teodora Medici, DO   7 months ago Erythema migrans (Lyme disease)   [facility] Teodora Medici, DO   9 months ago MDD (major depressive disorder), recurrent, severe, with psychosis [facility])   [place], [place]

## 2022-09-13 ENCOUNTER — Encounter: Payer: Self-pay | Admitting: Gastroenterology

## 2022-09-13 ENCOUNTER — Other Ambulatory Visit: Payer: Self-pay

## 2022-09-13 ENCOUNTER — Ambulatory Visit: Payer: Medicare HMO | Admitting: Gastroenterology

## 2022-09-13 VITALS — BP 145/87 | HR 85 | Temp 98.5°F | Ht 64.5 in | Wt 202.0 lb

## 2022-09-13 DIAGNOSIS — R112 Nausea with vomiting, unspecified: Secondary | ICD-10-CM

## 2022-09-13 DIAGNOSIS — K760 Fatty (change of) liver, not elsewhere classified: Secondary | ICD-10-CM

## 2022-09-13 NOTE — Progress Notes (Unsigned)
Victoria Mood MD, MRCP(U.K) [address]  [address]  [place], [place] [address]  Main: [phone]  Fax: [phone]   Gastroenterology Consultation  Referring Provider:     Margarita Mail, DO Primary Care Physician:  Victoria Mail, DO Primary Gastroenterologist:  Dr. Wyline [name]  Reason for Consultation: Fatty liver disease        HPI:   Victoria [name] is a 62 y.o. y/o female referred for consultation & management  by Dr. Margarita Mail, DO.    He states that she had an ultrasound at [facility] about 4 years back and was told she had fatty liver and needs to follow-up on that she has gained weight recently.  She also sees that she has been suffering from with nausea vomiting for some years recently got worse she can throw up food which is old.  Denies any heartburn taking Protonix 40 mg once a day.  She is on oxycodone for pain.   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  No recent liver imaging available 04/18/2022 ALT 48 AST 36 hemoglobin 14.4 g 01/20/2016 colonoscopy suggested was normal and repeat is due in 2027  Past Medical History:  Diagnosis Date   Asthma    Cervical spinal stenosis    Fatty liver    GERD (gastroesophageal reflux disease)    Hypercholesterolemia    Hypertension    Lumbar stenosis    Melanocarcinoma    Osteoarthritis of both hips    Psychotic disorder    Pulmonary fibrosis    Sleep apnea     Past Surgical History:  Procedure Laterality Date   ABDOMINAL HYSTERECTOMY     APPENDECTOMY     BREAST EXCISIONAL BIOPSY Right 1997   neg   HIATAL HERNIA REPAIR     Jan 2017   LAPAROSCOPY      Prior to Admission medications   Medication Sig Start Date End Date Taking? Authorizing Provider  albuterol (VENTOLIN HFA) 108 (90 Base) MCG/ACT inhaler Inhale 1-2 puffs into the lungs every 6 (six) hours as needed for wheezing or shortness of breath. 11/19/19   Clapacs, Victoria Denmark, MD  baclofen (LIORESAL) 10 MG tablet Take 10 mg  by mouth 3 (three) times daily. 09/28/21   [provider]  cholecalciferol (VITAMIN D) 25 MCG tablet Take 1 tablet (1,000 Units total) by mouth daily. 11/20/19   Clapacs, Victoria Denmark, MD  conjugated estrogens (PREMARIN) vaginal cream Premarin 0.625 mg/gram vaginal cream  INSERT INTO THE VAGINA EVERY OTHER DAY. 04/09/20   [provider]  DULoxetine (CYMBALTA) 60 MG capsule Take 1 capsule (60 mg total) by mouth daily. 08/20/22 11/18/22  Neysa Hotter, MD  fluticasone (FLONASE) 50 MCG/ACT nasal spray SPRAY 1 SPRAY INTO EACH NOSTRIL EVERY DAY AS NEEDED 08/06/19   [provider]  hydrOXYzine (ATARAX) 25 MG tablet Take 1 tablet (25 mg total) by mouth daily as needed. for anxiety 08/21/22 11/19/22  Neysa Hotter, MD  MELATONIN PO Take 10 mg by mouth at bedtime.    [provider]  meloxicam (MOBIC) 15 MG tablet Take 15 mg by mouth daily. 05/16/22   [provider]  ondansetron (ZOFRAN) 8 MG tablet Take 1 tablet (8 mg total) by mouth every 8 (eight) hours as needed for nausea or vomiting. 06/18/22   Victoria Mail, DO  oxyCODONE (ROXICODONE) 15 MG immediate release tablet Take 15 mg by mouth every 8 (eight) hours as needed. 09/17/21   [provider]  pantoprazole (PROTONIX) 40 MG tablet  TAKE 1 TABLET BY MOUTH EVERY DAY IN THE MORNING 08/23/22   Victoria Mail, DO  polyvinyl alcohol (LIQUIFILM TEARS) 1.4 % ophthalmic solution Place 1 drop into both eyes 3 (three) times daily. 11/19/19   Clapacs, Victoria Denmark, MD  pregabalin (LYRICA) 75 MG capsule Take 75 mg by mouth in the morning, at noon, in the evening, and at bedtime.    [provider]    Family History  Problem Relation Age of Onset   Alcohol abuse Mother    Arthritis Mother    Heart disease Mother    Liver disease Mother    Osteoporosis Mother    Clotting disorder Mother    Diabetes Father    Arthritis Father    Stroke Father    Sarcoidosis Father    Heart failure Father    Bipolar  disorder Sister    Schizophrenia Sister    Depression Sister    Anxiety disorder Sister    Alcohol abuse Sister    Diabetes Sister    Arthritis Sister    Thyroid disease Sister    Lupus Sister    Depression Sister    Anxiety disorder Sister    Alcohol abuse Sister    Depression Sister    Anxiety disorder Sister    Ehlers-Danlos syndrome Daughter      Social History   Tobacco Use   Smoking status: Former   Smokeless tobacco: Never  Building services engineer Use: Never used  Substance Use Topics   Alcohol use: No    Alcohol/week: 0.0 standard drinks of alcohol   Drug use: No    Comment: No use of illict drugs.    Allergies as of 09/13/2022 - Review Complete 08/19/2022  Allergen Reaction Noted   Atorvastatin  09/29/2021   Sertraline Hives and Shortness Of Breath 12/08/2006   Amoxicillin  07/05/2015   Aspirin  07/05/2015   Cefuroxime Hives 12/08/2006   Codeine  07/05/2015   Hydrocodone Hives 09/29/2021   Penicillins  07/05/2015   Sulfa antibiotics  07/05/2015    Review of Systems:    All systems reviewed and negative except where noted in HPI.   Physical Exam:  BP (!) 145/87   Pulse 85   Temp 98.5 F (36.9 C)   Ht 5' 4.5" (1.638 m)   Wt 202 lb (91.6 kg)   BMI 34.14 kg/m  No LMP recorded. Patient has had a hysterectomy. Psych:  Alert and cooperative. Normal [name] and affect. General:   Alert,  Well-developed, well-nourished, pleasant and cooperative in NAD Head:  Normocephalic and atraumatic. Eyes:  Sclera clear, no icterus.   Conjunctiva pink. Ears:  Normal auditory acuity.  Neurologic:  Alert and oriented x3;  grossly normal neurologically. Psych:  Alert and cooperative. Normal [name] and affect.  Imaging Studies: No results found.  Assessment and Plan:   [name] is a 62 y.o. y/o female has been referred for nonalcoholic steatohepatitis I do not find any recent imaging of the liver.  ALT mildly elevated will recheck if still elevated will require  full autoimmune and viral hepatitis workup.  History of nausea vomiting likely secondary to gastroparesis from the use of oxycodone  Plan 1.  Right upper quadrant ultrasound, FibroSure 2.  CMP 3.  If FibroSure suggest F2 F3 fibrosis then may be a candidate for the new medication that has been approved for metabolic steatohepatitis of the liver which can discuss at the follow-up visit 4.  EGD to rule out gastric outlet  obstruction 5.  Trial of gastroparesis diet patient information provided  I have discussed alternative options, risks & benefits,  which include, but are not limited to, bleeding, infection, perforation,respiratory complication & drug reaction.  The patient agrees with this plan & written consent will be obtained.     Follow up in 1-2 months with APP or myself  Dr Victoria Mood MD,MRCP(U.K)

## 2022-09-14 ENCOUNTER — Ambulatory Visit: Payer: Medicare HMO | Admitting: Psychiatry

## 2022-09-14 DIAGNOSIS — R200 Anesthesia of skin: Secondary | ICD-10-CM | POA: Diagnosis not present

## 2022-09-14 DIAGNOSIS — R202 Paresthesia of skin: Secondary | ICD-10-CM | POA: Diagnosis not present

## 2022-09-14 NOTE — Progress Notes (Signed)
Lft's abnormal get full autoimmune and viral hepatitis labs

## 2022-09-15 ENCOUNTER — Telehealth: Payer: Self-pay

## 2022-09-15 DIAGNOSIS — R7989 Other specified abnormal findings of blood chemistry: Secondary | ICD-10-CM

## 2022-09-15 LAB — COMPREHENSIVE METABOLIC PANEL
ALT: 160 IU/L — ABNORMAL HIGH (ref 0–32)
AST: 137 IU/L — ABNORMAL HIGH (ref 0–40)
Albumin/Globulin Ratio: 1.8 (ref 1.2–2.2)
Albumin: 4.2 g/dL (ref 3.9–4.9)
Alkaline Phosphatase: 116 IU/L (ref 44–121)
BUN/Creatinine Ratio: 14 (ref 12–28)
BUN: 13 mg/dL (ref 8–27)
Bilirubin Total: 0.5 mg/dL (ref 0.0–1.2)
CO2: 22 mmol/L (ref 20–29)
Calcium: 9.2 mg/dL (ref 8.7–10.3)
Chloride: 104 mmol/L (ref 96–106)
Creatinine, Ser: 0.91 mg/dL (ref 0.57–1.00)
Globulin, Total: 2.4 g/dL (ref 1.5–4.5)
Glucose: 101 mg/dL — ABNORMAL HIGH (ref 70–99)
Potassium: 4.3 mmol/L (ref 3.5–5.2)
Sodium: 140 mmol/L (ref 134–144)
Total Protein: 6.6 g/dL (ref 6.0–8.5)
eGFR: 71 mL/min/{1.73_m2} (ref >59)

## 2022-09-15 LAB — NASH FIBROSURE(R) PLUS
ALPHA 2-MACROGLOBULINS, QN: 257 mg/dL (ref 110–276)
ALT (SGPT) P5P: 174 IU/L — ABNORMAL HIGH (ref 0–40)
AST (SGOT) P5P: 150 IU/L — ABNORMAL HIGH (ref 0–40)
Apolipoprotein A-1: 163 mg/dL (ref 116–209)
Bilirubin, Total: 0.4 mg/dL (ref 0.0–1.2)
Cholesterol, Total: 277 mg/dL — ABNORMAL HIGH (ref 100–199)
Fibrosis Score: 0.44 — ABNORMAL HIGH (ref 0.00–0.21)
GGT: 206 IU/L — ABNORMAL HIGH (ref 0–60)
Glucose: 106 mg/dL — ABNORMAL HIGH (ref 70–99)
Haptoglobin: 134 mg/dL (ref 37–355)
NASH Score: 0.94 — ABNORMAL HIGH (ref 0.00–0.25)
Steatosis Score: 0.86 — ABNORMAL HIGH (ref 0.00–0.40)
Triglycerides: 193 mg/dL — ABNORMAL HIGH (ref 0–149)

## 2022-09-15 NOTE — Telephone Encounter (Signed)
Patient's husband stated that his wife would like to try Linzess as recommended at her last visit. Patient is currently taking Movantik but her husband stated that it makes her feel sick to her stomach. Therefore, he was calling wanting to get samples to try this time around with Linzess. Please let me know what samples to provide to the patient. Thank you.

## 2022-09-15 NOTE — Telephone Encounter (Signed)
-----   Message from Wyline Mood, MD sent at 09/14/2022  7:44 AM EDT ----- Lft's abnormal get full autoimmune and viral hepatitis labs

## 2022-09-15 NOTE — Telephone Encounter (Signed)
Samples left at the front desk. Patient is to call us in a week to let us know if it works for her to send a script for her.

## 2022-09-15 NOTE — Telephone Encounter (Signed)
Called patient to let her know that she is needing additional labs since her LFTs were elevated. Patient understood and had no further questions.

## 2022-09-17 ENCOUNTER — Encounter: Payer: Self-pay | Admitting: Gastroenterology

## 2022-09-20 ENCOUNTER — Encounter: Payer: Self-pay | Admitting: Gastroenterology

## 2022-09-20 ENCOUNTER — Ambulatory Visit: Payer: Medicare HMO | Admitting: General Practice

## 2022-09-20 ENCOUNTER — Telehealth: Payer: Self-pay | Admitting: Psychiatry

## 2022-09-20 ENCOUNTER — Ambulatory Visit: Payer: Medicare HMO | Admitting: Psychiatry

## 2022-09-20 ENCOUNTER — Ambulatory Visit
Admission: RE | Admit: 2022-09-20 | Discharge: 2022-09-20 | Disposition: A | Discharge status: Home / Self Care | Payer: Medicare HMO | Source: Ambulatory Visit | Attending: Gastroenterology | Admitting: Gastroenterology

## 2022-09-20 ENCOUNTER — Encounter
Admission: RE | Disposition: A | Discharge status: Home / Self Care | Payer: Self-pay | Source: Ambulatory Visit | Attending: Gastroenterology

## 2022-09-20 DIAGNOSIS — I10 Essential (primary) hypertension: Secondary | ICD-10-CM | POA: Insufficient documentation

## 2022-09-20 DIAGNOSIS — G4730 Sleep apnea, unspecified: Secondary | ICD-10-CM | POA: Diagnosis not present

## 2022-09-20 DIAGNOSIS — Z87891 Personal history of nicotine dependence: Secondary | ICD-10-CM | POA: Diagnosis not present

## 2022-09-20 DIAGNOSIS — J45909 Unspecified asthma, uncomplicated: Secondary | ICD-10-CM | POA: Diagnosis not present

## 2022-09-20 DIAGNOSIS — Z8249 Family history of ischemic heart disease and other diseases of the circulatory system: Secondary | ICD-10-CM | POA: Diagnosis not present

## 2022-09-20 DIAGNOSIS — K219 Gastro-esophageal reflux disease without esophagitis: Secondary | ICD-10-CM | POA: Insufficient documentation

## 2022-09-20 DIAGNOSIS — R112 Nausea with vomiting, unspecified: Secondary | ICD-10-CM

## 2022-09-20 HISTORY — DX: Lyme disease, unspecified: A69.20

## 2022-09-20 HISTORY — PX: ESOPHAGOGASTRODUODENOSCOPY (EGD) WITH PROPOFOL: SHX5813

## 2022-09-20 SURGERY — ESOPHAGOGASTRODUODENOSCOPY (EGD) WITH PROPOFOL
Anesthesia: General

## 2022-09-20 MED ORDER — LIDOCAINE HCL (CARDIAC) PF 100 MG/5ML IV SOSY
PREFILLED_SYRINGE | INTRAVENOUS | Status: DC | PRN
  Administered 2022-09-20: 40 mg via INTRAVENOUS

## 2022-09-20 MED ORDER — PROPOFOL 10 MG/ML IV BOLUS
INTRAVENOUS | Status: DC | PRN
  Administered 2022-09-20: 90 mg via INTRAVENOUS

## 2022-09-20 MED ORDER — SODIUM CHLORIDE 0.9 % IV SOLN: INTRAVENOUS | Status: DC

## 2022-09-20 MED ORDER — PROPOFOL 500 MG/50ML IV EMUL
INTRAVENOUS | Status: DC | PRN
  Administered 2022-09-20: 150 ug/kg/min via INTRAVENOUS

## 2022-09-20 NOTE — Telephone Encounter (Signed)
09-13-20 patient spouse called to cancel patient appointment for 09/20/22. 2 attempts to reach patient made same day and both times was a hang up. Patient spouse called back on 09/16/21 to cancel appointment, made him aware that I could only cancel and reschedule appointments with the patient. He stated she was asleep and would not wake up. Stated that she has a colonoscopy for Monday the 22. Once again let him know that Victoria [name] would need to call to cancel the appointment herself otherwise the appointment will stay on the schedule for the 22nd.

## 2022-09-20 NOTE — Anesthesia Postprocedure Evaluation (Signed)
Anesthesia Post Note  Patient: Victoria [name]  Procedure(s) Performed: ESOPHAGOGASTRODUODENOSCOPY (EGD) WITH PROPOFOL  Patient location during evaluation: Endoscopy Anesthesia Type: General Level of consciousness: awake and alert Pain management: pain level controlled Vital Signs Assessment: post-procedure vital signs reviewed and stable Respiratory status: spontaneous breathing, nonlabored ventilation, respiratory function stable and patient connected to nasal cannula oxygen Cardiovascular status: blood pressure returned to baseline and stable Postop Assessment: no apparent nausea or vomiting Anesthetic complications: no  No notable events documented.   Last Vitals:  Vitals:   09/20/22 1105 09/20/22 1114  BP: 137/70 135/74  Pulse: 73 66  Resp: 18 11  Temp:    SpO2: 93% 96%    Last Pain:  Vitals:   09/20/22 1114  TempSrc:   PainSc: 5                  Stephanie Coup

## 2022-09-20 NOTE — Anesthesia Preprocedure Evaluation (Signed)
Anesthesia Evaluation  Patient identified by MRN, date of birth, ID band Patient awake    Reviewed: Allergy & Precautions, NPO status , Patient's Chart, lab work & pertinent test results  Airway Mallampati: III  TM Distance: >3 FB Neck ROM: full    Dental  (+) Chipped, Dental Advidsory Given   Pulmonary asthma , sleep apnea , former smoker   Pulmonary exam normal        Cardiovascular hypertension, (-) angina negative cardio ROS Normal cardiovascular exam     Neuro/Psych Seizures -,  PSYCHIATRIC DISORDERS         GI/Hepatic Neg liver ROS,GERD  Medicated,,  Endo/Other  negative endocrine ROS    Renal/GU negative Renal ROS  negative genitourinary   Musculoskeletal   Abdominal   Peds  Hematology negative hematology ROS (+)   Anesthesia Other Findings Past Medical History: No date: Asthma No date: Cervical spinal stenosis No date: Fatty liver No date: GERD (gastroesophageal reflux disease) No date: Hypercholesterolemia No date: Hypertension No date: Lumbar stenosis No date: Lyme disease No date: Melanocarcinoma No date: Osteoarthritis of both hips No date: Psychotic disorder No date: Pulmonary fibrosis No date: Sleep apnea  Past Surgical History: No date: ABDOMINAL HYSTERECTOMY No date: APPENDECTOMY 1997: BREAST EXCISIONAL BIOPSY; Right     Comment:  neg No date: HIATAL HERNIA REPAIR     Comment:  Jan 2017 No date: LAPAROSCOPY     Reproductive/Obstetrics negative OB ROS                             Anesthesia Physical Anesthesia Plan  ASA: 2  Anesthesia Plan: General   Post-op Pain Management: Minimal or no pain anticipated   Induction: Intravenous  PONV Risk Score and Plan: 3 and Propofol infusion, TIVA and Ondansetron  Airway Management Planned: Nasal Cannula  Additional Equipment: None  Intra-op Plan:   Post-operative Plan:   Informed Consent: I have  reviewed the patients History and Physical, chart, labs and discussed the procedure including the risks, benefits and alternatives for the proposed anesthesia with the patient or authorized representative who has indicated his/her understanding and acceptance.     Dental advisory given  Plan Discussed with: CRNA and Surgeon  Anesthesia Plan Comments: (Discussed risks of anesthesia with patient, including possibility of difficulty with spontaneous ventilation under anesthesia necessitating airway intervention, PONV, and rare risks such as cardiac or respiratory or neurological events, and allergic reactions. Discussed the role of CRNA in patient's perioperative care. Patient understands.)        Anesthesia Quick Evaluation

## 2022-09-20 NOTE — H&P (Signed)
Wyline Mood, MD [address], [place], [place], [address] [address], [place], [place], [address] Phone: [phone]  Fax: [phone]  Primary Care Physician:  Margarita Mail, DO   Pre-Procedure History & Physical: HPI:  Victoria [name] is a 62 y.o. female is here for an endoscopy    Past Medical History:  Diagnosis Date   Asthma    Cervical spinal stenosis    Fatty liver    GERD (gastroesophageal reflux disease)    Hypercholesterolemia    Hypertension    Lumbar stenosis    Lyme disease    Melanocarcinoma    Osteoarthritis of both hips    Psychotic disorder    Pulmonary fibrosis    Sleep apnea     Past Surgical History:  Procedure Laterality Date   ABDOMINAL HYSTERECTOMY     APPENDECTOMY     BREAST EXCISIONAL BIOPSY Right 1997   neg   HIATAL HERNIA REPAIR     Jan 2017   LAPAROSCOPY      Prior to Admission medications   Medication Sig Start Date End Date Taking? Authorizing Provider  DULoxetine (CYMBALTA) 60 MG capsule Take 1 capsule (60 mg total) by mouth daily. 08/20/22 11/18/22 Yes Hisada, Barbee Cough, MD  fluticasone (FLONASE) 50 MCG/ACT nasal spray SPRAY 1 SPRAY INTO EACH NOSTRIL EVERY DAY AS NEEDED 08/06/19  Yes [provider]  hydrOXYzine (ATARAX) 25 MG tablet Take 1 tablet (25 mg total) by mouth daily as needed. for anxiety 08/21/22 11/19/22 Yes Hisada, Barbee Cough, MD  oxyCODONE (ROXICODONE) 15 MG immediate release tablet Take 15 mg by mouth every 8 (eight) hours as needed. 09/17/21  Yes [provider]  pantoprazole (PROTONIX) 40 MG tablet TAKE 1 TABLET BY MOUTH EVERY DAY IN THE MORNING 08/23/22  Yes Margarita Mail, DO  pregabalin (LYRICA) 75 MG capsule Take 75 mg by mouth in the morning, at noon, in the evening, and at bedtime.   Yes [provider]  albuterol (VENTOLIN HFA) 108 (90 Base) MCG/ACT inhaler Inhale 1-2 puffs into the lungs every 6 (six) hours as needed for wheezing or shortness of breath. 11/19/19    Clapacs, Jackquline Denmark, MD  baclofen (LIORESAL) 10 MG tablet Take 10 mg by mouth 3 (three) times daily. Patient not taking: Reported on 09/20/2022 09/28/21   [provider]  cholecalciferol (VITAMIN D) 25 MCG tablet Take 1 tablet (1,000 Units total) by mouth daily. 11/20/19   Clapacs, Jackquline Denmark, MD  conjugated estrogens (PREMARIN) vaginal cream Premarin 0.625 mg/gram vaginal cream  INSERT INTO THE VAGINA EVERY OTHER DAY. 04/09/20   [provider]  MELATONIN PO Take 10 mg by mouth at bedtime.    [provider]  meloxicam (MOBIC) 15 MG tablet Take 15 mg by mouth daily. Patient not taking: Reported on 09/20/2022 05/16/22   [provider]  ondansetron (ZOFRAN) 8 MG tablet Take 1 tablet (8 mg total) by mouth every 8 (eight) hours as needed for nausea or vomiting. 06/18/22   Margarita Mail, DO  polyvinyl alcohol (LIQUIFILM TEARS) 1.4 % ophthalmic solution Place 1 drop into both eyes 3 (three) times daily. 11/19/19   Clapacs, Jackquline Denmark, MD    Allergies as of 09/13/2022 - Review Complete 08/19/2022  Allergen Reaction Noted   Atorvastatin  09/29/2021   Sertraline Hives and Shortness Of Breath 12/08/2006   Amoxicillin  07/05/2015   Aspirin  07/05/2015   Cefuroxime Hives 12/08/2006   Codeine  07/05/2015   Hydrocodone Hives 09/29/2021  Penicillins  07/05/2015   Sulfa antibiotics  07/05/2015    Family History  Problem Relation Age of Onset   Alcohol abuse Mother    Arthritis Mother    Heart disease Mother    Liver disease Mother    Osteoporosis Mother    Clotting disorder Mother    Diabetes Father    Arthritis Father    Stroke Father    Sarcoidosis Father    Heart failure Father    Bipolar disorder Sister    Schizophrenia Sister    Depression Sister    Anxiety disorder Sister    Alcohol abuse Sister    Diabetes Sister    Arthritis Sister    Thyroid disease Sister    Lupus Sister    Depression Sister    Anxiety disorder Sister    Alcohol abuse Sister     Depression Sister    Anxiety disorder Sister    Ehlers-Danlos syndrome Daughter     Social History   Socioeconomic History   Marital status: Married    Spouse name: bruce   Number of children: 1   Years of education: Not on file   Highest education level: Associate degree: academic program  Occupational History   Occupation: home maker  Tobacco Use   Smoking status: Former   Smokeless tobacco: Never  Building services engineer Use: Never used  Substance and Sexual Activity   Alcohol use: No    Alcohol/week: 0.0 standard drinks of alcohol   Drug use: No    Comment: No use of illict drugs.   Sexual activity: Not Currently  Other Topics Concern   Not on file  Social History Narrative   Lives with husband at home.   Social Determinants of Health   Financial Resource Strain: Low Risk  (08/19/2022)   Overall Financial Resource Strain (CARDIA)    Difficulty of Paying Living Expenses: Not hard at all  Food Insecurity: No Food Insecurity (08/19/2022)   Hunger Vital Sign    Worried About Running Out of Food in the Last Year: Never true    Ran Out of Food in the Last Year: Never true  Transportation Needs: No Transportation Needs (08/19/2022)   PRAPARE - Administrator, Civil Service (Medical): No    Lack of Transportation (Non-Medical): No  Physical Activity: Inactive (08/19/2022)   Exercise Vital Sign    Days of Exercise per Week: 0 days    Minutes of Exercise per Session: 0 min  Stress: Stress Concern Present (08/19/2022)   [employer] of Occupational Health - Occupational Stress Questionnaire    Feeling of Stress : To some extent  Social Connections: Moderately Isolated (08/19/2022)   Social Connection and Isolation Panel [NHANES]    Frequency of Communication with Friends and Family: More than three times a week    Frequency of Social Gatherings with Friends and Family: Never    Attends Religious Services: Never    Database administrator or Organizations: No     Attends Banker Meetings: Never    Marital Status: Married  Catering manager Violence: Not At Risk (08/19/2022)   Humiliation, Afraid, Rape, and Kick questionnaire    Fear of Current or Ex-Partner: No    Emotionally Abused: No    Physically Abused: No    Sexually Abused: No    Review of Systems: See HPI, otherwise negative ROS  Physical Exam: BP (!) 140/88   Pulse 70   Temp (!) 96.7 F (  35.9 C) (Tympanic)   Resp 11   Wt 91.2 kg   SpO2 96%   BMI 33.97 kg/m  General:   Alert,  pleasant and cooperative in NAD Head:  Normocephalic and atraumatic. Neck:  Supple; no masses or thyromegaly. Lungs:  Clear throughout to auscultation, normal respiratory effort.    Heart:  +S1, +S2, Regular rate and rhythm, No edema. Abdomen:  Soft, nontender and nondistended. Normal bowel sounds, without guarding, and without rebound.   Neurologic:  Alert and  oriented x4;  grossly normal neurologically.  Impression/Plan: Victoria [name] is here for an endoscopy  to be performed for  evaluation of nausea and vomiting     Risks, benefits, limitations, and alternatives regarding endoscopy have been reviewed with the patient.  Questions have been answered.  All parties agreeable.   Wyline Mood, MD  09/20/2022, 1:26 PM

## 2022-09-20 NOTE — Anesthesia Procedure Notes (Signed)
Date/Time: 09/20/2022 11:01 AM  Performed by: Stormy Fabian, CRNAPre-anesthesia Checklist: Patient identified, Emergency Drugs available, Suction available and Patient being monitored Patient Re-evaluated:Patient Re-evaluated prior to induction Oxygen Delivery Method: Nasal cannula Induction Type: IV induction Dental Injury: Teeth and Oropharynx as per pre-operative assessment  Comments: Nasal cannula with etCO2 monitoring

## 2022-09-20 NOTE — Op Note (Signed)
[facility] Gastroenterology Patient Name: Victoria [name] Procedure Date: 09/20/2022 10:29 AM MRN: [id number] Account #: [id number] Date of Birth: [birth year] Admit Type: Outpatient Age: 62 Room: [facility] ENDO ROOM 1 Gender: Female Note Status: Finalized Instrument Name: Upper Endoscope [id number] Procedure:             Upper GI endoscopy Indications:           Nausea with vomiting Providers:             Wyline Mood MD, MD Referring MD:          No Local Md, MD (Referring MD) Medicines:             Monitored Anesthesia Care Complications:         No immediate complications. Procedure:             Pre-Anesthesia Assessment:                        - Prior to the procedure, a History and Physical was                         performed, and patient medications, allergies and                         sensitivities were reviewed. The patient's tolerance                         of previous anesthesia was reviewed.                        - The risks and benefits of the procedure and the                         sedation options and risks were discussed with the                         patient. All questions were answered and informed                         consent was obtained.                        - ASA Grade Assessment: II - A patient with mild                         systemic disease.                        After obtaining informed consent, the endoscope was                         passed under direct vision. Throughout the procedure,                         the patient's blood pressure, pulse, and oxygen                         saturations were monitored continuously. The Endoscope  was introduced through the mouth, and advanced to the                         third part of duodenum. The upper GI endoscopy was                         accomplished with ease. The patient tolerated the                         procedure well. Findings:      The esophagus  was normal.      The stomach was normal.      The examined duodenum was normal. Impression:            - Normal esophagus.                        - Normal stomach.                        - Normal examined duodenum.                        - No specimens collected. Recommendation:        - Discharge patient to home (with escort).                        - Resume previous diet.                        - Continue present medications.                        - Return to my office as previously scheduled. Procedure Code(s):     --- Professional ---                        [phone], Esophagogastroduodenoscopy, flexible,                         transoral; diagnostic, including collection of                         specimen(s) by brushing or washing, when performed                         (separate procedure) Diagnosis Code(s):     --- Professional ---                        R11.2, Nausea with vomiting, unspecified CPT copyright 2022 American Medical Association. All rights reserved. The codes documented in this report are preliminary and upon coder review may  be revised to meet current compliance requirements. Wyline Mood, MD Wyline Mood MD, MD 09/20/2022 10:59:56 AM This report has been signed electronically. Number of Addenda: 0 Note Initiated On: 09/20/2022 10:29 AM Estimated Blood Loss:  Estimated blood loss: none.      [facility]

## 2022-09-20 NOTE — Transfer of Care (Signed)
Immediate Anesthesia Transfer of Care Note  Patient: Victoria [name]  Procedure(s) Performed: Procedure(s): ESOPHAGOGASTRODUODENOSCOPY (EGD) WITH PROPOFOL (N/A)  Patient Location: PACU and Endoscopy Unit  Anesthesia Type:General  Level of Consciousness: sedated  Airway & Oxygen Therapy: Patient Spontanous Breathing and Patient connected to nasal cannula oxygen  Post-op Assessment: Report given to RN and Post -op Vital signs reviewed and stable  Post vital signs: Reviewed and stable  Last Vitals:  Vitals:   09/20/22 1104 09/20/22 1105  BP: 137/70 137/70  Pulse: 74 73  Resp: 17 18  Temp: (!) 35.9 C   SpO2: 95% 93%    Complications: No apparent anesthesia complications

## 2022-09-22 ENCOUNTER — Encounter: Payer: Self-pay | Admitting: Gastroenterology

## 2022-09-23 ENCOUNTER — Ambulatory Visit
Admission: RE | Admit: 2022-09-23 | Discharge: 2022-09-23 | Disposition: A | Discharge status: Home / Self Care | Payer: Medicare HMO | Source: Ambulatory Visit | Attending: Gastroenterology | Admitting: Gastroenterology

## 2022-09-23 ENCOUNTER — Telehealth: Payer: Self-pay

## 2022-09-23 ENCOUNTER — Other Ambulatory Visit
Admission: RE | Admit: 2022-09-23 | Discharge: 2022-09-23 | Disposition: A | Discharge status: Home / Self Care | Payer: Medicare HMO | Source: Ambulatory Visit | Attending: Gastroenterology | Admitting: Gastroenterology

## 2022-09-23 DIAGNOSIS — K760 Fatty (change of) liver, not elsewhere classified: Secondary | ICD-10-CM

## 2022-09-23 DIAGNOSIS — K7689 Other specified diseases of liver: Secondary | ICD-10-CM | POA: Diagnosis not present

## 2022-09-23 LAB — CK: Total CK: 57 U/L (ref 38–234)

## 2022-09-23 NOTE — Telephone Encounter (Signed)
Called patient and it just kept ringing and ringing. I will send patient a message via MyChart letting her know that today's ultrasound only showed fatty liver per Dr. Tobi Bastos.

## 2022-09-23 NOTE — Progress Notes (Signed)
Shows fatty liver

## 2022-09-27 DIAGNOSIS — G8929 Other chronic pain: Secondary | ICD-10-CM | POA: Diagnosis not present

## 2022-09-27 DIAGNOSIS — Z760 Encounter for issue of repeat prescription: Secondary | ICD-10-CM | POA: Diagnosis not present

## 2022-09-27 DIAGNOSIS — M5441 Lumbago with sciatica, right side: Secondary | ICD-10-CM | POA: Diagnosis not present

## 2022-09-27 DIAGNOSIS — Z9181 History of falling: Secondary | ICD-10-CM | POA: Diagnosis not present

## 2022-09-27 DIAGNOSIS — M5442 Lumbago with sciatica, left side: Secondary | ICD-10-CM | POA: Diagnosis not present

## 2022-09-27 DIAGNOSIS — M5412 Radiculopathy, cervical region: Secondary | ICD-10-CM | POA: Diagnosis not present

## 2022-09-27 DIAGNOSIS — Z79891 Long term (current) use of opiate analgesic: Secondary | ICD-10-CM | POA: Diagnosis not present
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
## 2022-10-04 ENCOUNTER — Encounter: Payer: Self-pay | Admitting: Psychiatry

## 2022-10-04 ENCOUNTER — Encounter (HOSPITAL_COMMUNITY): Payer: Self-pay

## 2022-10-04 ENCOUNTER — Ambulatory Visit: Payer: Medicare HMO | Admitting: Psychiatry

## 2022-10-04 VITALS — BP 144/82 | HR 79 | Temp 97.9°F | Ht 64.5 in | Wt 201.8 lb

## 2022-10-04 DIAGNOSIS — F419 Anxiety disorder, unspecified: Secondary | ICD-10-CM | POA: Diagnosis not present

## 2022-10-04 DIAGNOSIS — F331 Major depressive disorder, recurrent, moderate: Secondary | ICD-10-CM

## 2022-10-04 DIAGNOSIS — F4310 Post-traumatic stress disorder, unspecified: Secondary | ICD-10-CM

## 2022-10-04 MED ORDER — BUSPIRONE HCL 5 MG PO TABS: 5.0000 mg | ORAL_TABLET | Freq: Three times a day (TID) | ORAL | 0 refills | Status: DC

## 2022-10-04 NOTE — Patient Instructions (Signed)
Continue duloxetine 60 mg daily  Start Buspar 5 mg three times a day  Continue hydroxyzine 25 mg daily prn for anxiety  Next appointment: 6/20 at 9:30

## 2022-10-06 ENCOUNTER — Telehealth: Payer: Self-pay | Admitting: Gastroenterology

## 2022-10-06 MED ORDER — LINACLOTIDE 290 MCG PO CAPS: 290.0000 ug | ORAL_CAPSULE | Freq: Every day | ORAL | 3 refills | Status: DC

## 2022-10-06 NOTE — Telephone Encounter (Signed)
Prescription sent to her pharmacy

## 2022-10-06 NOTE — Addendum Note (Signed)
Addended by: Adela Ports on: 10/06/2022 01:35 PM   Modules accepted: Orders

## 2022-10-06 NOTE — Telephone Encounter (Signed)
Pt had samples of linzess she would like a prescription called in to [facility]

## 2022-10-18 ENCOUNTER — Other Ambulatory Visit: Payer: Self-pay

## 2022-10-18 ENCOUNTER — Encounter: Payer: Self-pay | Admitting: Internal Medicine

## 2022-10-19 MED ORDER — ALBUTEROL SULFATE HFA 108 (90 BASE) MCG/ACT IN AERS: 1.0000 | INHALATION_SPRAY | Freq: Four times a day (QID) | RESPIRATORY_TRACT | 1 refills | Status: DC | PRN

## 2022-11-10 ENCOUNTER — Other Ambulatory Visit: Payer: Self-pay | Admitting: Psychiatry
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
## 2022-11-18 ENCOUNTER — Encounter: Payer: Medicare HMO | Admitting: Psychiatry

## 2022-11-18 ENCOUNTER — Telehealth: Payer: Self-pay | Admitting: Psychiatry

## 2022-11-18 NOTE — Progress Notes (Signed)
This encounter was created in error - please disregard.

## 2022-11-18 NOTE — Telephone Encounter (Signed)
I sent a video visit link through Epic, but the patient did not sign in. I tried calling for today's appointment twice, but the phone was disconnected each time.

## 2022-11-25 DIAGNOSIS — R202 Paresthesia of skin: Secondary | ICD-10-CM | POA: Diagnosis not present

## 2022-11-25 DIAGNOSIS — R200 Anesthesia of skin: Secondary | ICD-10-CM | POA: Diagnosis not present

## 2022-11-25 DIAGNOSIS — G894 Chronic pain syndrome: Secondary | ICD-10-CM | POA: Diagnosis not present

## 2022-12-02 ENCOUNTER — Other Ambulatory Visit: Payer: Self-pay | Admitting: Psychiatry

## 2022-12-07 ENCOUNTER — Ambulatory Visit: Payer: Self-pay

## 2022-12-07 NOTE — Telephone Encounter (Signed)
  Chief Complaint: Difficulty breathing, green mucous, low grade fever, cough Symptoms: above Frequency: 3.5 months Pertinent Negatives: Patient denies  Disposition: [] ED /[] Urgent Care (no appt availability in office) / [x] Appointment(In office/virtual)/ []  [facility]/ [] Home Care/ [] Refused Recommended Disposition /[] [facility]/ []  Follow-up with PCP Additional Notes: Pt states that she has chronic bronchitis and asthma. Pt has had cough and difficulty breathing off and on for 3.5 months. Pt has used her (expired) inhaler w/o improvement. Pt states that she has a low grade fever, and feels sweaty.   Reason for Disposition  [1] MODERATE longstanding difficulty breathing (e.g., speaks in phrases, SOB even at rest, pulse 100-120) AND [2] SAME as normal  Answer Assessment - Initial Assessment Questions 1. RESPIRATORY STATUS: "Describe your breathing?" (e.g., wheezing, shortness of breath, unable to speak, severe coughing)      Cough, green mucous, low fever 2. ONSET: "When did this breathing problem begin?"      3.5 months ago 3. PATTERN "Does the difficult breathing come and go, or has it been constant since it started?"      Comes and goes 4. SEVERITY: "How bad is your breathing?" (e.g., mild, moderate, severe)    - MILD: No SOB at rest, mild SOB with walking, speaks normally in sentences, can lie down, no retractions, pulse < 100.    - MODERATE: SOB at rest, SOB with minimal exertion and prefers to sit, cannot lie down flat, speaks in phrases, mild retractions, audible wheezing, pulse 100-120.    - SEVERE: Very SOB at rest, speaks in single words, struggling to breathe, sitting hunched forward, retractions, pulse > 120      mild 5. RECURRENT SYMPTOM: "Have you had difficulty breathing before?" If Yes, ask: "When was the last time?" and "What happened that time?"      yes 6. CARDIAC HISTORY: "Do you have any history of heart disease?" (e.g., heart attack, angina,  bypass surgery, angioplasty)      Did not ask 7. LUNG HISTORY: "Do you have any history of lung disease?"  (e.g., pulmonary embolus, asthma, emphysema)     Bronchitis and asthma 8. CAUSE: "What do you think is causing the breathing problem?"      Infection 9. OTHER SYMPTOMS: "Do you have any other symptoms? (e.g., dizziness, runny nose, cough, chest pain, fever)     Fever, cough  Protocols used: Breathing Difficulty-A-AH

## 2022-12-08 ENCOUNTER — Ambulatory Visit (INDEPENDENT_AMBULATORY_CARE_PROVIDER_SITE_OTHER): Payer: Medicare HMO | Admitting: Family Medicine

## 2022-12-08 ENCOUNTER — Other Ambulatory Visit: Payer: Self-pay | Admitting: Family Medicine

## 2022-12-08 ENCOUNTER — Encounter: Payer: Self-pay | Admitting: Family Medicine

## 2022-12-08 VITALS — BP 154/86 | HR 82 | Temp 97.7°F | Resp 16 | Ht 64.5 in | Wt 208.8 lb

## 2022-12-08 DIAGNOSIS — J4531 Mild persistent asthma with (acute) exacerbation: Secondary | ICD-10-CM | POA: Diagnosis not present

## 2022-12-08 DIAGNOSIS — R0602 Shortness of breath: Secondary | ICD-10-CM

## 2022-12-08 DIAGNOSIS — R030 Elevated blood-pressure reading, without diagnosis of hypertension: Secondary | ICD-10-CM

## 2022-12-08 DIAGNOSIS — J8410 Pulmonary fibrosis, unspecified: Secondary | ICD-10-CM | POA: Diagnosis not present

## 2022-12-08 DIAGNOSIS — J42 Unspecified chronic bronchitis: Secondary | ICD-10-CM | POA: Diagnosis not present

## 2022-12-08 MED ORDER — NEBULIZER/TUBING/MOUTHPIECE KIT
PACK | 0 refills | Status: DC
Start: 2022-12-08 — End: 2022-12-08

## 2022-12-08 MED ORDER — ALBUTEROL SULFATE HFA 108 (90 BASE) MCG/ACT IN AERS
1.0000 | INHALATION_SPRAY | Freq: Four times a day (QID) | RESPIRATORY_TRACT | 1 refills | Status: DC | PRN
Start: 2022-12-08 — End: 2023-12-14

## 2022-12-08 MED ORDER — PREDNISONE 20 MG PO TABS
40.0000 mg | ORAL_TABLET | Freq: Every day | ORAL | 0 refills | Status: DC
Start: 2022-12-08 — End: 2023-05-05

## 2022-12-08 MED ORDER — BENZONATATE 100 MG PO CAPS
100.0000 mg | ORAL_CAPSULE | Freq: Two times a day (BID) | ORAL | 0 refills | Status: DC | PRN
Start: 2022-12-08 — End: 2023-04-18

## 2022-12-08 MED ORDER — IPRATROPIUM-ALBUTEROL 0.5-2.5 (3) MG/3ML IN SOLN: 3.0000 mL | Freq: Three times a day (TID) | RESPIRATORY_TRACT | 1 refills | Status: DC | PRN

## 2022-12-08 MED ORDER — NEBULIZER/TUBING/MOUTHPIECE KIT
PACK | 0 refills | Status: DC
Start: 2022-12-08 — End: 2023-10-22

## 2022-12-08 MED ORDER — AZITHROMYCIN 250 MG PO TABS
ORAL_TABLET | ORAL | 0 refills | Status: AC
Start: 2022-12-08 — End: 2022-12-13

## 2022-12-08 NOTE — Progress Notes (Signed)
Cxr ordered for appt today

## 2022-12-08 NOTE — Progress Notes (Signed)
Patient ID: Victoria [name], female    DOB: 05/05/1961, 62 y.o.   MRN: [id number]  PCP: Margarita Mail, DO  Chief Complaint  Patient presents with   Cough    Onset for months   Shortness of Breath    While being active, getting worst    Subjective:   Victoria [name] is a 62 y.o. female, presents to clinic with CC of the following:  HPI  Patient is a 62 year old female she presents with roughly 3 months worth of shortness of breath cough and wheeze worse than her baseline with a history of asthma pulmonary fibrosis and chronic bronchitis.  She states that she was previously established with pulmonology but her symptoms are well-controlled and she was not requiring maintenance inhalers and after moving to this area and establishing new care here she has not needed a pulmonologist. She reports having increased mucus production, cough with posttussive emesis, tightness in her chest, wheeze shortness of breath and low-grade fever and sweats which she states she commonly gets when she has an exacerbation She only has an expired albuterol rescue inhaler which has not helped with her symptoms very much She does feel like she has nasal congestion and postnasal drip In the past Z-Pak steroids inhalers or nebulizers have worked for her  She is not having any pain with breathing.   If she has continued to her baseline and chronic pulmonary symptoms she would like to establish with pulmonology locally In the past she was on Breo  Her blood pressure is elevated today- she is not currently dx with or on HTN meds BP Readings from Last 3 Encounters:  12/08/22 (!) 154/86  09/20/22 (!) 140/88  09/13/22 (!) 145/87     Patient Active Problem List   Diagnosis Date Noted   Nausea and vomiting 09/20/2022   At low risk for fall 06/16/2022   Hyperlipidemia 09/29/2021   OSA (obstructive sleep apnea) 09/29/2021   Vitamin D deficiency 09/29/2021   Long term (current) use of opiate analgesic  04/06/2021   Issue of repeat prescription for medication 04/06/2021   SVT (supraventricular tachycardia) 04/09/2020   Vaginal atrophy 04/09/2020   Class 1 obesity due to excess calories with serious comorbidity and body mass index (BMI) of 31.0 to 31.9 in adult 02/07/2020   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   Chronic fatigue 01/07/2020   History of melanoma 01/07/2020   Endometriosis 01/07/2020   Fibrosis of lung (HCC) 12/12/2019   Nonalcoholic steatohepatitis 12/12/2019   Convulsions (HCC) 12/12/2019   Irritable bowel syndrome 12/12/2019   Osteopenia 12/12/2019   Pernicious anemia 12/12/2019   Prediabetes 12/12/2019   Traumatic brain injury (HCC) 12/12/2019   Psychosis (HCC) 11/15/2019   Chronic pain syndrome 11/15/2019   Corneal abrasion 11/15/2019   Suicidal thoughts 11/15/2019   MDD (major depressive disorder), single episode, severe with psychosis (HCC) 11/14/2019   Migraine 07/30/2019   Stage 3a chronic kidney disease (HCC) 07/25/2019   Spinal stenosis of lumbar region with neurogenic claudication 07/17/2019   Skin lesion 01/02/2019   Spondylolisthesis of lumbar region 04/24/2018   Numbness and tingling in both hands 12/30/2017   Cervical radiculopathy 10/25/2017   Pain medication agreement 10/25/2017   Sacroiliitis (HCC) 05/03/2016   Neuropathy 01/06/2016   Right hip pain 01/06/2016   MDD (major depressive disorder), recurrent, severe, with psychosis (HCC)    Osteoarthritis of both hips 07/06/2015   Dyslipidemia 07/06/2015   GERD (gastroesophageal reflux disease) 07/06/2015   Asthma 07/06/2015   Bipolar I  disorder, most recent episode manic, severe with psychotic features (HCC) 07/06/2015   Attention-deficit hyperactivity disorder, predominantly inattentive type 02/25/2014   Elevation of levels of liver transaminase levels 01/02/2014   Impaired fasting glucose 01/02/2014   Melanosis coli 09/20/2012   Screening for colon cancer 09/07/2012   Lactose  intolerance 03/06/2012   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   Mild persistent asthma 03/26/2009   Acquired absence of other genital organ(s) 10/30/2008   Hx of total hysterectomy 10/30/2008   Lateral epicondylitis, unspecified elbow 08/03/2006   Tinnitus, unspecified ear 08/11/2004      Current Outpatient Medications:    azithromycin (ZITHROMAX) 250 MG tablet, Take 2 tablets on day 1, then 1 tablet daily on days 2 through 5, Disp: 6 tablet, Rfl: 0   baclofen (LIORESAL) 10 MG tablet, Take 10 mg by mouth 3 (three) times daily., Disp: , Rfl:    benzonatate (TESSALON) 100 MG capsule, Take 1-2 capsules (100-200 mg total) by mouth 2 (two) times daily as needed for cough., Disp: 20 capsule, Rfl: 0   busPIRone (BUSPAR) 5 MG tablet, Take 1 tablet (5 mg total) by mouth 3 (three) times daily., Disp: 270 tablet, Rfl: 0   cholecalciferol (VITAMIN D) 25 MCG tablet, Take 1 tablet (1,000 Units total) by mouth daily., Disp: 30 tablet, Rfl: 1   conjugated estrogens (PREMARIN) vaginal cream, Premarin 0.625 mg/gram vaginal cream  INSERT INTO THE VAGINA EVERY OTHER DAY., Disp: , Rfl:    fluticasone (FLONASE) 50 MCG/ACT nasal spray, SPRAY 1 SPRAY INTO EACH NOSTRIL EVERY DAY AS NEEDED, Disp: , Rfl:    hydrocortisone 2.5 % cream, APPLY TO AFFECTED AREA TWICE A DAY, Disp: , Rfl:    linaclotide (LINZESS) 290 MCG CAPS capsule, Take 1 capsule (290 mcg total) by mouth daily., Disp: 90 capsule, Rfl: 3   MELATONIN PO, Take 10 mg by mouth at bedtime., Disp: , Rfl:    meloxicam (MOBIC) 15 MG tablet, Take 15 mg by mouth daily., Disp: , Rfl:    ondansetron (ZOFRAN) 8 MG tablet, Take 1 tablet (8 mg total) by mouth every 8 (eight) hours as needed for nausea or vomiting., Disp: 20 tablet, Rfl: 0   oxyCODONE (ROXICODONE) 15 MG immediate release tablet, Take 15 mg by mouth every 8 (eight) hours as needed., Disp: , Rfl:    pantoprazole (PROTONIX) 40 MG tablet, TAKE 1 TABLET BY MOUTH EVERY DAY IN THE MORNING, Disp: 90 tablet, Rfl: 3    polyvinyl alcohol (LIQUIFILM TEARS) 1.4 % ophthalmic solution, Place 1 drop into both eyes 3 (three) times daily., Disp: 15 mL, Rfl: 1   predniSONE (DELTASONE) 20 MG tablet, Take 2 tablets (40 mg total) by mouth daily., Disp: 10 tablet, Rfl: 0   pregabalin (LYRICA) 150 MG capsule, Take 150 mg by mouth 3 (three) times daily., Disp: , Rfl:    albuterol (VENTOLIN HFA) 108 (90 Base) MCG/ACT inhaler, Inhale 1-2 puffs into the lungs every 6 (six) hours as needed for wheezing or shortness of breath., Disp: 18 g, Rfl: 1   DULoxetine (CYMBALTA) 60 MG capsule, Take 1 capsule (60 mg total) by mouth daily., Disp: 90 capsule, Rfl: 0   Allergies  Allergen Reactions   Atorvastatin     Myalgias    Sertraline Hives and Shortness Of Breath   Amoxicillin    Aspirin    Cefuroxime Hives   Codeine    Hydrocodone Hives   Penicillins    Sulfa Antibiotics      Social History   Tobacco Use  Smoking status: Former   Smokeless tobacco: Never  Building services engineer Use: Never used  Substance Use Topics   Alcohol use: No    Alcohol/week: 0.0 standard drinks of alcohol   Drug use: No    Comment: No use of illict drugs.      Chart Review Today: I personally reviewed active problem list, medication list, allergies, family history, social history, health maintenance, notes from last encounter, lab results, imaging with the patient/caregiver today.   Review of Systems  Constitutional: Negative.   HENT: Negative.    Eyes: Negative.   Respiratory: Negative.    Cardiovascular: Negative.   Gastrointestinal: Negative.   Endocrine: Negative.   Genitourinary: Negative.   Musculoskeletal: Negative.   Skin: Negative.   Allergic/Immunologic: Negative.   Neurological: Negative.   Hematological: Negative.   Psychiatric/Behavioral: Negative.    All other systems reviewed and are negative.      Objective:   Vitals:   12/08/22 1538  BP: (!) 154/86  Pulse: 82  Resp: 16  Temp: 97.7 F (36.5 C)   TempSrc: Oral  SpO2: 94%  Weight: 208 lb 12.8 oz (94.7 kg)  Height: 5' 4.5" (1.638 m)    Body mass index is 35.29 kg/m.  Physical Exam Vitals and nursing note reviewed.  Constitutional:      General: She is not in acute distress.    Appearance: Normal appearance. She is well-developed. She is obese. She is not ill-appearing, toxic-appearing or diaphoretic.  HENT:     Head: Normocephalic and atraumatic.     Right Ear: Hearing, tympanic membrane, ear canal and external ear normal.     Left Ear: Hearing, tympanic membrane, ear canal and external ear normal.     Nose: Mucosal edema, congestion and rhinorrhea present.     Right Sinus: No maxillary sinus tenderness or frontal sinus tenderness.     Left Sinus: No maxillary sinus tenderness or frontal sinus tenderness.     Mouth/Throat:     Mouth: Mucous membranes are moist. Mucous membranes are not pale.     Pharynx: Oropharynx is clear. Uvula midline. No oropharyngeal exudate, posterior oropharyngeal erythema or uvula swelling.     Tonsils: No tonsillar abscesses.  Eyes:     General:        Right eye: No discharge.        Left eye: No discharge.     Conjunctiva/sclera: Conjunctivae normal.     Pupils: Pupils are equal, round, and reactive to light.  Neck:     Trachea: No tracheal deviation.  Cardiovascular:     Rate and Rhythm: Normal rate and regular rhythm.     Pulses: Normal pulses.     Heart sounds: Normal heart sounds.  Pulmonary:     Effort: Pulmonary effort is normal. No tachypnea, accessory muscle usage, respiratory distress or retractions.     Breath sounds: Normal air entry. No stridor, decreased air movement or transmitted upper airway sounds. Rhonchi (scattered) present. No decreased breath sounds, wheezing or rales.     Comments: Able to speak in full and complete sentences a few episodes of coughing, which sounded slightly wet and wheezy Chest:     Chest wall: No tenderness.  Abdominal:     General: Bowel sounds  are normal. There is no distension.     Palpations: Abdomen is soft.  Musculoskeletal:     Cervical back: Normal range of motion.  Skin:    General: Skin is warm and dry.  Coloration: Skin is not pale.     Findings: No rash.  Neurological:     Mental Status: She is alert.     Motor: No abnormal muscle tone.     Coordination: Coordination normal.  Psychiatric:        Mood and Affect: Mood normal.        Behavior: Behavior normal.      Results for orders placed or performed during the hospital encounter of 09/23/22  CK  Result Value Ref Range   Total CK 57 38 - 234 U/L       Assessment & Plan:    1. Shortness of breath Worse than baseline shortness of breath cough mucus production -she reports symptoms have been ongoing for 3 months, she has been into clinic multiple times but she did not really notice how bad her respiratory symptoms were due to having multiple other more severe acute complaints   2. Fibrosis of lung [facility]) Patient reports history of pulmonary fibrosis and previously having a pulmonologist she may want to establish with a local pulmonologist she will let us know via MyChart message - Respiratory Therapy Supplies (NEBULIZER/TUBING/MOUTHPIECE) KIT; Disp one nebulizer machine, tubing set and mouthpiece kit  Dispense: 1 kit; Refill: 0  3. Mild persistent asthma with acute exacerbation Exacerbation of asthma versus chronic bronchitis She has some congestion very productive cough encouraged her to treat with Mucinex, also do a burst of steroids, try Tessalon Perles, we will try to get her a nebulizer machine with home breathing treatments also refill on her rescue inhaler She reports being off maintenance inhalers for about 2 years Is currently unclear if she will need to resume maintenance inhalers or if this is just a prolonged exacerbation that she delayed presenting for -did encourage her to follow-up with her PCP for reevaluation She is hesitant to come back  for another doctor's appointment due to having multiple recent doctors appointments I gave her samples of maintenance inhalers and gave her the option of sending a MyChart message for when she would like to do a pulmonology referral -with her mixed history (pulmonary fibrosis asthma and chronic bronchitis?  It may be prudent to get a local pulmonologist consult and some PFTs)  - predniSONE (DELTASONE) 20 MG tablet; Take 2 tablets (40 mg total) by mouth daily.  Dispense: 10 tablet; Refill: 0 - ipratropium-albuterol (DUONEB) 0.5-2.5 (3) MG/3ML SOLN; Take 3 mLs by nebulization 3 (three) times daily as needed (cough wheeze SOB bronchitis/asthma symptoms).  Dispense: 180 mL; Refill: 1 - Respiratory Therapy Supplies (NEBULIZER/TUBING/MOUTHPIECE) KIT; Disp one nebulizer machine, tubing set and mouthpiece kit  Dispense: 1 kit; Refill: 0  4. Elevated blood pressure reading At the end of the visit the patient asked about her elevated blood pressure, I encouraged her to check her blood pressure at home as soon as she feels back to baseline and come in to the office for a follow-up appointment with her blood pressure readings to reevaluate this am not sure if she has new hypertension and will require daily medications or if with acute illness her blood pressure is simply reacting and elevated above her baseline I reviewed her blood pressures for her last several office visits and has been elevated several times, she reports a history of being on metoprolol in the past I explained there would be other first-line medications which we would recommend and to return for the follow-up we can have that discussion then BP Readings from Last 5 Encounters:  12/08/22 Marland Kitchen)  154/86  09/20/22 (!) 140/88  09/13/22 (!) 145/87  05/20/22 122/73  05/10/22 132/80     5. Chronic bronchitis, unspecified chronic bronchitis type (HCC) Tx with steroid burst, mucinex, tessalon, other otc cough meds, inhalers and zpak - predniSONE  (DELTASONE) 20 MG tablet; Take 2 tablets (40 mg total) by mouth daily.  Dispense: 10 tablet; Refill: 0 - benzonatate (TESSALON) 100 MG capsule; Take 1-2 capsules (100-200 mg total) by mouth 2 (two) times daily as needed for cough.  Dispense: 20 capsule; Refill: 0 - azithromycin (ZITHROMAX) 250 MG tablet; Take 2 tablets on day 1, then 1 tablet daily on days 2 through 5  Dispense: 6 tablet; Refill: 0 - albuterol (VENTOLIN HFA) 108 (90 Base) MCG/ACT inhaler; Inhale 1-2 puffs into the lungs every 6 (six) hours as needed for wheezing or shortness of breath.  Dispense: 18 g; Refill: 1 - ipratropium-albuterol (DUONEB) 0.5-2.5 (3) MG/3ML SOLN; Take 3 mLs by nebulization 3 (three) times daily as needed (cough wheeze SOB bronchitis/asthma symptoms).  Dispense: 180 mL; Refill: 1 - Respiratory Therapy Supplies (NEBULIZER/TUBING/MOUTHPIECE) KIT; Disp one nebulizer machine, tubing set and mouthpiece kit  Dispense: 1 kit; Refill: 0   Return for 2-4 weeks for breathing recheck and BP recheck.    Danelle Berry, PA-C 12/08/22 3:57 PM

## 2022-12-13 ENCOUNTER — Ambulatory Visit: Payer: Medicare HMO | Admitting: Gastroenterology

## 2022-12-13 NOTE — Progress Notes (Deleted)
Wyline Mood MD, MRCP(U.K) [address]  [address]  [place], [place] [address]  Main: [phone]  Fax: [phone]   Primary Care Physician: Margarita Mail, DO  Primary Gastroenterologist:  Dr. Wyline Mood   No chief complaint on file.   HPI: Victoria [name] is a 62 y.o. female  Summary of history : Initially referred to seen in April 2024 for fatty liver disease.  Had an ultrasound about 4 years back and was told she had fatty liver and needs follow-up.  Had gained weight recently.  Some recent nausea vomiting and throwing up.  No heartburn.  She had been on oxycodone for pain.04/18/2022 ALT 48 AST 36 hemoglobin 14.4 g 01/20/2016 colonoscopy suggested was normal and repeat is due in 2027   Interval history 09/13/2022-12/13/2022  09/20/2022: EGD: Normal 09/23/2022 right upper quadrant ultrasound shows steatosis 09/23/2022: CK normal, AST 137 ALT 160 FibroSure score S2-S3 steatosis and F1 F2 fibrosis severe NASH.  Full autoimmune screen was ordered but patient has not yet obtained that  ***    Current Outpatient Medications  Medication Sig Dispense Refill   albuterol (VENTOLIN HFA) 108 (90 Base) MCG/ACT inhaler Inhale 1-2 puffs into the lungs every 6 (six) hours as needed for wheezing or shortness of breath. 18 g 1   azithromycin (ZITHROMAX) 250 MG tablet Take 2 tablets on day 1, then 1 tablet daily on days 2 through 5 6 tablet 0   baclofen (LIORESAL) 10 MG tablet Take 10 mg by mouth 3 (three) times daily.     benzonatate (TESSALON) 100 MG capsule Take 1-2 capsules (100-200 mg total) by mouth 2 (two) times daily as needed for cough. 20 capsule 0   busPIRone (BUSPAR) 5 MG tablet Take 1 tablet (5 mg total) by mouth 3 (three) times daily. 270 tablet 0   cholecalciferol (VITAMIN D) 25 MCG tablet Take 1 tablet (1,000 Units total) by mouth daily. 30 tablet 1   conjugated estrogens (PREMARIN) vaginal cream Premarin 0.625 mg/gram vaginal cream  INSERT INTO THE VAGINA EVERY  OTHER DAY.     DULoxetine (CYMBALTA) 60 MG capsule Take 1 capsule (60 mg total) by mouth daily. 90 capsule 0   fluticasone (FLONASE) 50 MCG/ACT nasal spray SPRAY 1 SPRAY INTO EACH NOSTRIL EVERY DAY AS NEEDED     hydrocortisone 2.5 % cream APPLY TO AFFECTED AREA TWICE A DAY     ipratropium-albuterol (DUONEB) 0.5-2.5 (3) MG/3ML SOLN Take 3 mLs by nebulization 3 (three) times daily as needed (cough wheeze SOB bronchitis/asthma symptoms). 180 mL 1   linaclotide (LINZESS) 290 MCG CAPS capsule Take 1 capsule (290 mcg total) by mouth daily. 90 capsule 3   MELATONIN PO Take 10 mg by mouth at bedtime.     meloxicam (MOBIC) 15 MG tablet Take 15 mg by mouth daily.     ondansetron (ZOFRAN) 8 MG tablet Take 1 tablet (8 mg total) by mouth every 8 (eight) hours as needed for nausea or vomiting. 20 tablet 0   oxyCODONE (ROXICODONE) 15 MG immediate release tablet Take 15 mg by mouth every 8 (eight) hours as needed.     pantoprazole (PROTONIX) 40 MG tablet TAKE 1 TABLET BY MOUTH EVERY DAY IN THE MORNING 90 tablet 3   polyvinyl alcohol (LIQUIFILM TEARS) 1.4 % ophthalmic solution Place 1 drop into both eyes 3 (three) times daily. 15 mL 1   predniSONE (DELTASONE) 20 MG tablet Take 2 tablets (40 mg total) by mouth daily. 10 tablet 0   pregabalin (LYRICA)  150 MG capsule Take 150 mg by mouth 3 (three) times daily.     Respiratory Therapy Supplies (NEBULIZER/TUBING/MOUTHPIECE) KIT Disp one nebulizer machine, tubing set and mouthpiece kit 1 kit 0   No current facility-administered medications for this visit.    Allergies as of 12/13/2022 - Review Complete 12/08/2022  Allergen Reaction Noted   Atorvastatin  09/29/2021   Sertraline Hives and Shortness Of Breath 12/08/2006   Amoxicillin  07/05/2015   Aspirin  07/05/2015   Cefuroxime Hives 12/08/2006   Codeine  07/05/2015   Hydrocodone Hives 09/29/2021   Penicillins  07/05/2015   Sulfa antibiotics  07/05/2015       Interval history   ***/***/202*    ***/***/2024   ROS:  General: Negative for anorexia, weight loss, fever, chills, fatigue, weakness. ENT: Negative for hoarseness, difficulty swallowing , nasal congestion. CV: Negative for chest pain, angina, palpitations, dyspnea on exertion, peripheral edema.  Respiratory: Negative for dyspnea at rest, dyspnea on exertion, cough, sputum, wheezing.  GI: See history of present illness. GU:  Negative for dysuria, hematuria, urinary incontinence, urinary frequency, nocturnal urination.  Endo: Negative for unusual weight change.    Physical Examination:   There were no vitals taken for this visit.  General: Well-nourished, well-developed in no acute distress.  Eyes: No icterus. Conjunctivae pink. Mouth: Oropharyngeal mucosa moist and pink , no lesions erythema or exudate. Lungs: Clear to auscultation bilaterally. Non-labored. Heart: Regular rate and rhythm, no murmurs rubs or gallops.  Abdomen: Bowel sounds are normal, nontender, nondistended, no hepatosplenomegaly or masses, no abdominal bruits or hernia , no rebound or guarding.   Extremities: No lower extremity edema. No clubbing or deformities. Neuro: Alert and oriented x 3.  Grossly intact. Skin: Warm and dry, no jaundice.   Psych: Alert and cooperative, normal mood and affect.   Imaging Studies: No results found.  Assessment and Plan:   Victoria [name] is a 62 y.o. y/o female here to follow-up nonalcoholic steatohepatitisALT mildly elevated will recheck if still elevated will require full autoimmune and viral hepatitis workup.  History of nausea vomiting likely secondary to gastroparesis from the use of oxycodone   Plan 1.  Request full autoimmune and viral hepatitis workup due to abnormal LFTs 2.  CMP 3.  Linzess 5.  Trial of gastroparesis diet patient information provided    Dr Wyline Mood  MD,MRCP [facility]) Follow up in ***  BP check ***

## 2022-12-17 DIAGNOSIS — J4531 Mild persistent asthma with (acute) exacerbation: Secondary | ICD-10-CM | POA: Diagnosis not present

## 2022-12-17 DIAGNOSIS — J8410 Pulmonary fibrosis, unspecified: Secondary | ICD-10-CM | POA: Diagnosis not present

## 2022-12-28 DIAGNOSIS — M5442 Lumbago with sciatica, left side: Secondary | ICD-10-CM | POA: Diagnosis not present

## 2022-12-28 DIAGNOSIS — Z9181 History of falling: Secondary | ICD-10-CM | POA: Diagnosis not present

## 2022-12-28 DIAGNOSIS — M5412 Radiculopathy, cervical region: Secondary | ICD-10-CM | POA: Diagnosis not present

## 2022-12-28 DIAGNOSIS — Z79891 Long term (current) use of opiate analgesic: Secondary | ICD-10-CM | POA: Diagnosis not present

## 2022-12-28 DIAGNOSIS — Z760 Encounter for issue of repeat prescription: Secondary | ICD-10-CM | POA: Diagnosis not present

## 2022-12-28 DIAGNOSIS — M5441 Lumbago with sciatica, right side: Secondary | ICD-10-CM | POA: Diagnosis not present

## 2022-12-28 DIAGNOSIS — G8929 Other chronic pain: Secondary | ICD-10-CM | POA: Diagnosis not present

## 2022-12-29 NOTE — Progress Notes (Unsigned)
Established Patient Office Visit  Subjective    Patient ID: Victoria [name], female    DOB: Jun 20, 1960  Age: 62 y.o. MRN: [id number]  CC:  No chief complaint on file.   HPI [name] presents to for follow up. She is here with her husband.   Fibromyalgia/chronic pain: -Family history includes: sister with Lupus, daughter and granddaughter with [name], other sister diabetes inspidus, thyroid disease  -Dealing with severe pain for years, multiple joints with chronic pain and fatigue  -Labs from 8/23 showing ANA negative, inflammatory markers negative, TSH, free T3 and free T4 normal, RF normal. -Patient had positive Lyme's in August, since then has been having episodes of excruciating  pain where she has 10/10 immense pains all over her body. Feels like neuropathic/shock like pain. Appears worse in her legs but happens all over. Her legs are weak and will give out on her. Pain occurs in episodes that happen at least 10 times per day on a daily basis since August. Associated symptoms include intense hot flashes and headaches. She was treated for Lyme with Doxycycline for 21 days. She has a sister with chronic lyme and she wants to see a rheumatologist but the referral was denied. She was just started on Cymbalta 60 mg from her Psychiatrist which helps somewhat. She is also on gabapentin 100 mg TID and Oxycodone per pain management.    NASH: -Most recent LFTs 11/23 showing ALT 48, AST 36 -Was supposed to have a Fibroscan but moved and needs a new GI here   MDD: -Follows with Psychiatry -Mood status: fluctuating  -Current treatment: Cymbalta 60 mg, Trazodone 50 mg at night  -Symptom severity: severe  -Side effects: no Medication compliance: excellent compliance Previous psychiatric medications: has been on multiple and failed   Outpatient Encounter Medications as of 12/30/2022  Medication Sig   albuterol (VENTOLIN HFA) 108 (90 Base) MCG/ACT inhaler Inhale 1-2 puffs into  the lungs every 6 (six) hours as needed for wheezing or shortness of breath.   baclofen (LIORESAL) 10 MG tablet Take 10 mg by mouth 3 (three) times daily.   benzonatate (TESSALON) 100 MG capsule Take 1-2 capsules (100-200 mg total) by mouth 2 (two) times daily as needed for cough.   busPIRone (BUSPAR) 5 MG tablet Take 1 tablet (5 mg total) by mouth 3 (three) times daily.   cholecalciferol (VITAMIN D) 25 MCG tablet Take 1 tablet (1,000 Units total) by mouth daily.   conjugated estrogens (PREMARIN) vaginal cream Premarin 0.625 mg/gram vaginal cream  INSERT INTO THE VAGINA EVERY OTHER DAY.   DULoxetine (CYMBALTA) 60 MG capsule Take 1 capsule (60 mg total) by mouth daily.   fluticasone (FLONASE) 50 MCG/ACT nasal spray SPRAY 1 SPRAY INTO EACH NOSTRIL EVERY DAY AS NEEDED   hydrocortisone 2.5 % cream APPLY TO AFFECTED AREA TWICE A DAY   ipratropium-albuterol (DUONEB) 0.5-2.5 (3) MG/3ML SOLN Take 3 mLs by nebulization 3 (three) times daily as needed (cough wheeze SOB bronchitis/asthma symptoms).   linaclotide (LINZESS) 290 MCG CAPS capsule Take 1 capsule (290 mcg total) by mouth daily.   MELATONIN PO Take 10 mg by mouth at bedtime.   meloxicam (MOBIC) 15 MG tablet Take 15 mg by mouth daily.   ondansetron (ZOFRAN) 8 MG tablet Take 1 tablet (8 mg total) by mouth every 8 (eight) hours as needed for nausea or vomiting.   oxyCODONE (ROXICODONE) 15 MG immediate release tablet Take 15 mg by mouth every 8 (eight) hours as needed.   pantoprazole (  PROTONIX) 40 MG tablet TAKE 1 TABLET BY MOUTH EVERY DAY IN THE MORNING   polyvinyl alcohol (LIQUIFILM TEARS) 1.4 % ophthalmic solution Place 1 drop into both eyes 3 (three) times daily.   predniSONE (DELTASONE) 20 MG tablet Take 2 tablets (40 mg total) by mouth daily.   pregabalin (LYRICA) 150 MG capsule Take 150 mg by mouth 3 (three) times daily.   Respiratory Therapy Supplies (NEBULIZER/TUBING/MOUTHPIECE) KIT Disp one nebulizer machine, tubing set and mouthpiece kit    No facility-administered encounter medications on file as of 12/30/2022.    Past Medical History:  Diagnosis Date   Asthma    Cervical spinal stenosis    Fatty liver    GERD (gastroesophageal reflux disease)    Hypercholesterolemia    Hypertension    Lumbar stenosis    Lyme disease    Melanocarcinoma (HCC)    Osteoarthritis of both hips    Psychotic disorder (HCC)    Pulmonary fibrosis (HCC)    Sleep apnea     Past Surgical History:  Procedure Laterality Date   ABDOMINAL HYSTERECTOMY     APPENDECTOMY     BREAST EXCISIONAL BIOPSY Right 1997   neg   ESOPHAGOGASTRODUODENOSCOPY (EGD) WITH PROPOFOL N/A 09/20/2022   Procedure: ESOPHAGOGASTRODUODENOSCOPY (EGD) WITH PROPOFOL;  Surgeon: Wyline Mood, MD;  Location: [facility] ENDOSCOPY;  Service: Gastroenterology;  Laterality: N/A;   HIATAL HERNIA REPAIR     Jan 2017   LAPAROSCOPY      Family History  Problem Relation Age of Onset   Alcohol abuse Mother    Arthritis Mother    Heart disease Mother    Liver disease Mother    Osteoporosis Mother    Clotting disorder Mother    Diabetes Father    Arthritis Father    Stroke Father    Sarcoidosis Father    Heart failure Father    Bipolar disorder Sister    Schizophrenia Sister    Depression Sister    Anxiety disorder Sister    Alcohol abuse Sister    Diabetes Sister    Arthritis Sister    Thyroid disease Sister    Lupus Sister    Depression Sister    Anxiety disorder Sister    Alcohol abuse Sister    Depression Sister    Anxiety disorder Sister    Ehlers-Danlos syndrome Daughter     Social History   Socioeconomic History   Marital status: Married    Spouse name: bruce   Number of children: 1   Years of education: Not on file   Highest education level: Associate degree: academic program  Occupational History   Occupation: home maker  Tobacco Use   Smoking status: Former   Smokeless tobacco: Never  Advertising account planner   Vaping status: Never Used  Substance and Sexual  Activity   Alcohol use: No    Alcohol/week: 0.0 standard drinks of alcohol   Drug use: No    Comment: No use of illict drugs.   Sexual activity: Not Currently  Other Topics Concern   Not on file  Social History Narrative   Lives with husband at home.   Social Determinants of Health   Financial Resource Strain: Low Risk  (08/19/2022)   Overall Financial Resource Strain (CARDIA)    Difficulty of Paying Living Expenses: Not hard at all  Food Insecurity: No Food Insecurity (08/19/2022)   Hunger Vital Sign    Worried About Running Out of Food in the Last Year: Never true  Ran Out of Food in the Last Year: Never true  Transportation Needs: No Transportation Needs (08/19/2022)   PRAPARE - Administrator, Civil Service (Medical): No    Lack of Transportation (Non-Medical): No  Physical Activity: Inactive (08/19/2022)   Exercise Vital Sign    Days of Exercise per Week: 0 days    Minutes of Exercise per Session: 0 min  Stress: Stress Concern Present (08/19/2022)   [employer] of Occupational Health - Occupational Stress Questionnaire    Feeling of Stress : To some extent  Social Connections: Moderately Isolated (08/19/2022)   Social Connection and Isolation Panel [NHANES]    Frequency of Communication with Friends and Family: More than three times a week    Frequency of Social Gatherings with Friends and Family: Never    Attends Religious Services: Never    Database administrator or Organizations: No    Attends Banker Meetings: Never    Marital Status: Married  Catering manager Violence: Not At Risk (08/19/2022)   Humiliation, Afraid, Rape, and Kick questionnaire    Fear of Current or Ex-Partner: No    Emotionally Abused: No    Physically Abused: No    Sexually Abused: No    Review of Systems  Constitutional:  Positive for malaise/fatigue. Negative for chills and fever.  Respiratory:  Negative for shortness of breath.   Cardiovascular:  Negative for  chest pain.  Gastrointestinal:  Negative for abdominal pain.  Musculoskeletal:  Positive for joint pain.  Neurological:  Positive for tingling, weakness and headaches. Negative for loss of consciousness.  All other systems reviewed and are negative.     Objective    There were no vitals taken for this visit.  Physical Exam Constitutional:      Appearance: Normal appearance.  HENT:     Head: Normocephalic and atraumatic.     Mouth/Throat:     Mouth: Mucous membranes are moist.     Pharynx: Oropharynx is clear.  Eyes:     Extraocular Movements: Extraocular movements intact.     Conjunctiva/sclera: Conjunctivae normal.     Pupils: Pupils are equal, round, and reactive to light.  Cardiovascular:     Rate and Rhythm: Normal rate and regular rhythm.  Pulmonary:     Effort: Pulmonary effort is normal.     Breath sounds: Normal breath sounds.  Abdominal:     Comments: Epigastric pain to palpation  Musculoskeletal:     Right lower leg: No edema.     Left lower leg: No edema.  Skin:    General: Skin is warm and dry.  Neurological:     General: No focal deficit present.     Mental Status: She is alert. Mental status is at baseline.     Cranial Nerves: No cranial nerve deficit.     Sensory: No sensory deficit.     Motor: Weakness present.     Gait: Gait normal.     Comments: Difficult to assess - not fully participating in exam   Psychiatric:        Mood and Affect: Mood normal.        Behavior: Behavior normal.       Assessment & Plan:   1. Lyme arthritis (HCC): Continued joint pains since August when she was diagnosed with Lyme but she was treated appropriately at the time. I think the Lyme could have exacerbated her fibromyalgia as she has had chronic pain for some time. Cymbalta helping somewhat,  continue Gabapentin 100 mg TID. Referral to ID placed for ongoing Lyme symptoms, re-treat with Doxycyline for another 14 days.   - doxycycline (VIBRA-TABS) 100 MG tablet; Take  1 tablet (100 mg total) by mouth 2 (two) times daily for 14 days.  Dispense: 28 tablet; Refill: 0 - gabapentin (NEURONTIN) 100 MG capsule; Take 1 capsule (100 mg total) by mouth 3 (three) times daily.  Dispense: 90 capsule; Refill: 2 - Ambulatory referral to Infectious Disease  2. Nonalcoholic steatohepatitis: Referral to GI for NASH and fibroscan.   - Ambulatory referral to Gastroenterology  3. Transient weakness of left lower extremity: Referral to Neurology for EMG and transient lower extremity weakness.   - Ambulatory referral to Neurology   No follow-ups on file.   Margarita Mail, DO

## 2022-12-30 ENCOUNTER — Encounter: Payer: Self-pay | Admitting: Internal Medicine

## 2022-12-30 ENCOUNTER — Ambulatory Visit (INDEPENDENT_AMBULATORY_CARE_PROVIDER_SITE_OTHER): Payer: Medicare HMO | Admitting: Internal Medicine

## 2022-12-30 VITALS — BP 136/82 | HR 85 | Temp 98.5°F | Resp 18 | Ht 64.5 in | Wt 209.3 lb

## 2022-12-30 DIAGNOSIS — Z1283 Encounter for screening for malignant neoplasm of skin: Secondary | ICD-10-CM

## 2022-12-30 DIAGNOSIS — R29898 Other symptoms and signs involving the musculoskeletal system: Secondary | ICD-10-CM | POA: Diagnosis not present

## 2022-12-30 DIAGNOSIS — B354 Tinea corporis: Secondary | ICD-10-CM

## 2022-12-30 DIAGNOSIS — J4531 Mild persistent asthma with (acute) exacerbation: Secondary | ICD-10-CM

## 2022-12-30 MED ORDER — KETOCONAZOLE 2 % EX CREA
1.0000 | TOPICAL_CREAM | Freq: Every day | CUTANEOUS | 0 refills | Status: AC
Start: 2022-12-30 — End: ?

## 2022-12-30 MED ORDER — UMECLIDINIUM-VILANTEROL 62.5-25 MCG/ACT IN AEPB: 1.0000 | INHALATION_SPRAY | Freq: Every day | RESPIRATORY_TRACT | 1 refills | Status: DC

## 2023-01-04 NOTE — Telephone Encounter (Deleted)
Please schedule a 30-minute, in person follow-up.

## 2023-01-04 NOTE — Telephone Encounter (Signed)
Tried calling, phone rings and when answered hangs up. A mychart message was sent requesting a call to our office to schedule an appointment.

## 2023-01-19 ENCOUNTER — Ambulatory Visit: Admission: RE | Admit: 2023-01-19 | Payer: Medicare HMO | Source: Ambulatory Visit

## 2023-01-19 ENCOUNTER — Ambulatory Visit
Admission: RE | Admit: 2023-01-19 | Discharge: 2023-01-19 | Disposition: A | Discharge status: Home / Self Care | Payer: Medicare HMO | Attending: Family Medicine | Admitting: Family Medicine

## 2023-01-19 DIAGNOSIS — R0602 Shortness of breath: Secondary | ICD-10-CM | POA: Diagnosis not present

## 2023-01-19 DIAGNOSIS — R61 Generalized hyperhidrosis: Secondary | ICD-10-CM | POA: Diagnosis not present

## 2023-01-19 DIAGNOSIS — R509 Fever, unspecified: Secondary | ICD-10-CM | POA: Diagnosis not present

## 2023-01-27 DIAGNOSIS — M5441 Lumbago with sciatica, right side: Secondary | ICD-10-CM | POA: Diagnosis not present

## 2023-01-27 DIAGNOSIS — Z760 Encounter for issue of repeat prescription: Secondary | ICD-10-CM | POA: Diagnosis not present

## 2023-01-27 DIAGNOSIS — Z79891 Long term (current) use of opiate analgesic: Secondary | ICD-10-CM | POA: Diagnosis not present

## 2023-01-27 DIAGNOSIS — M5442 Lumbago with sciatica, left side: Secondary | ICD-10-CM | POA: Diagnosis not present

## 2023-01-27 DIAGNOSIS — M5412 Radiculopathy, cervical region: Secondary | ICD-10-CM | POA: Diagnosis not present

## 2023-01-27 DIAGNOSIS — G8929 Other chronic pain: Secondary | ICD-10-CM | POA: Diagnosis not present

## 2023-02-01 ENCOUNTER — Ambulatory Visit: Payer: Medicare HMO | Admitting: Gastroenterology

## 2023-02-01 DIAGNOSIS — R7989 Other specified abnormal findings of blood chemistry: Secondary | ICD-10-CM

## 2023-02-05 LAB — COMPREHENSIVE METABOLIC PANEL
ALT: 86 IU/L — ABNORMAL HIGH (ref 0–32)
AST: 95 IU/L — ABNORMAL HIGH (ref 0–40)
Albumin: 4.3 g/dL (ref 3.9–4.9)
Alkaline Phosphatase: 126 IU/L — ABNORMAL HIGH (ref 44–121)
BUN/Creatinine Ratio: 12 (ref 12–28)
BUN: 12 mg/dL (ref 8–27)
Bilirubin Total: 0.4 mg/dL (ref 0.0–1.2)
CO2: 23 mmol/L (ref 20–29)
Calcium: 9.2 mg/dL (ref 8.7–10.3)
Chloride: 103 mmol/L (ref 96–106)
Creatinine, Ser: 1.02 mg/dL — ABNORMAL HIGH (ref 0.57–1.00)
Globulin, Total: 2.6 g/dL (ref 1.5–4.5)
Glucose: 125 mg/dL — ABNORMAL HIGH (ref 70–99)
Potassium: 4.5 mmol/L (ref 3.5–5.2)
Sodium: 141 mmol/L (ref 134–144)
Total Protein: 6.9 g/dL (ref 6.0–8.5)
eGFR: 62 mL/min/{1.73_m2} (ref >59)

## 2023-02-10 ENCOUNTER — Encounter: Payer: Self-pay | Admitting: Gastroenterology

## 2023-02-10 ENCOUNTER — Other Ambulatory Visit: Payer: Self-pay

## 2023-02-10 ENCOUNTER — Ambulatory Visit: Payer: Medicare HMO | Admitting: Gastroenterology

## 2023-02-10 VITALS — BP 106/75 | HR 70 | Temp 97.9°F | Wt 210.0 lb

## 2023-02-10 DIAGNOSIS — K7581 Nonalcoholic steatohepatitis (NASH): Secondary | ICD-10-CM

## 2023-02-10 DIAGNOSIS — Z23 Encounter for immunization: Secondary | ICD-10-CM

## 2023-02-10 NOTE — Progress Notes (Signed)
Wyline Mood MD, MRCP(U.K) [address]  [address]  [place], [place] [address]  Main: [phone]  Fax: [phone]   Primary Care Physician: Margarita Mail, DO  Primary Gastroenterologist:  Dr. Wyline Mood   Chief Complaint  Patient presents with   Fatty livert    HPI: Victoria [name] is a 62 y.o. female   Summary of history :  She was initially referred and seen in April 2020 for her fatty liver disease.  Had an ultrasound in [place] 4 years back and was told she had fatty liver.04/18/2022 ALT 48 AST 36 hemoglobin 14.4 g 01/20/2016 colonoscopy suggested was normal and repeat is due in 2027  Interval history   09/13/2022 -02/10/2023   09/23/2022 right upper quadrant ultrasound shows no cholelithiasis steatosis 02/01/2023: AST 95 ALT 86, alkaline phosphatase 126 CK normal.  Hepatitis A total antibody negative, hepatitis B surface antibody nonreactive.  Hepatitis B surface antigen negative hepatitis C antibody negative.  HIV negative.  Smooth muscle antibody positive at 22 antimitochondrial ab to body negative immunoglobulin levels normal celiac serology negative   09/20/2022 EGD: Normal  09/13/2022 severe NASH, F1 F2 fibrosis  She is doing well.  Takes Protonix for reflux. No other new complaints she is not diabetic  Current Outpatient Medications  Medication Sig Dispense Refill   albuterol (VENTOLIN HFA) 108 (90 Base) MCG/ACT inhaler Inhale 1-2 puffs into the lungs every 6 (six) hours as needed for wheezing or shortness of breath. 18 g 1   baclofen (LIORESAL) 10 MG tablet Take 10 mg by mouth 3 (three) times daily.     benzonatate (TESSALON) 100 MG capsule Take 1-2 capsules (100-200 mg total) by mouth 2 (two) times daily as needed for cough. 20 capsule 0   busPIRone (BUSPAR) 5 MG tablet TAKE 1 TABLET BY MOUTH THREE TIMES A DAY 270 tablet 0   cholecalciferol (VITAMIN D) 25 MCG tablet Take 1 tablet (1,000 Units total) by mouth daily. 30 tablet 1   conjugated  estrogens (PREMARIN) vaginal cream Premarin 0.625 mg/gram vaginal cream  INSERT INTO THE VAGINA EVERY OTHER DAY.     DULoxetine (CYMBALTA) 30 MG capsule Take 30 mg by mouth 2 (two) times daily.     fluticasone (FLONASE) 50 MCG/ACT nasal spray SPRAY 1 SPRAY INTO EACH NOSTRIL EVERY DAY AS NEEDED     hydrocortisone 2.5 % cream APPLY TO AFFECTED AREA TWICE A DAY     hydrOXYzine (ATARAX) 25 MG tablet TAKE 1 TABLET (25 MG TOTAL) BY MOUTH DAILY AS NEEDED. FOR ANXIETY 90 tablet 0   ipratropium-albuterol (DUONEB) 0.5-2.5 (3) MG/3ML SOLN Take 3 mLs by nebulization 3 (three) times daily as needed (cough wheeze SOB bronchitis/asthma symptoms). 180 mL 1   ketoconazole (NIZORAL) 2 % cream Apply 1 Application topically daily. 15 g 0   linaclotide (LINZESS) 290 MCG CAPS capsule Take 1 capsule (290 mcg total) by mouth daily. 90 capsule 3   MELATONIN PO Take 10 mg by mouth at bedtime.     meloxicam (MOBIC) 15 MG tablet Take 15 mg by mouth daily.     ondansetron (ZOFRAN) 8 MG tablet Take 1 tablet (8 mg total) by mouth every 8 (eight) hours as needed for nausea or vomiting. 20 tablet 0   Oxycodone HCl 10 MG TABS Take 1 tablet by mouth every 6 (six) hours as needed.     pantoprazole (PROTONIX) 40 MG tablet TAKE 1 TABLET BY MOUTH EVERY DAY IN THE MORNING 90 tablet 3   polyvinyl alcohol (  LIQUIFILM TEARS) 1.4 % ophthalmic solution Place 1 drop into both eyes 3 (three) times daily. 15 mL 1   predniSONE (DELTASONE) 20 MG tablet Take 2 tablets (40 mg total) by mouth daily. 10 tablet 0   pregabalin (LYRICA) 150 MG capsule Take 150 mg by mouth 3 (three) times daily.     Respiratory Therapy Supplies (NEBULIZER/TUBING/MOUTHPIECE) KIT Disp one nebulizer machine, tubing set and mouthpiece kit 1 kit 0   umeclidinium-vilanterol (ANORO ELLIPTA) 62.5-25 MCG/ACT AEPB Inhale 1 puff into the lungs daily at 6 (six) AM. 14 each 1   No current facility-administered medications for this visit.    Allergies as of 02/10/2023 - Review  Complete 12/30/2022  Allergen Reaction Noted   Atorvastatin  09/29/2021   Sertraline Hives and Shortness Of Breath 12/08/2006   Amoxicillin  07/05/2015   Aspirin  07/05/2015   Cefuroxime Hives 12/08/2006   Codeine  07/05/2015   Hydrocodone Hives 09/29/2021   Penicillins  07/05/2015   Sulfa antibiotics  07/05/2015     ROS:  General: Negative for anorexia, weight loss, fever, chills, fatigue, weakness. ENT: Negative for hoarseness, difficulty swallowing , nasal congestion. CV: Negative for chest pain, angina, palpitations, dyspnea on exertion, peripheral edema.  Respiratory: Negative for dyspnea at rest, dyspnea on exertion, cough, sputum, wheezing.  GI: See history of present illness. GU:  Negative for dysuria, hematuria, urinary incontinence, urinary frequency, nocturnal urination.  Endo: Negative for unusual weight change.    Physical Examination:   BP (!) 159/80   Pulse 75   Temp 97.9 F (36.6 C) (Oral)   Wt 210 lb (95.3 kg)   BMI 35.49 kg/m   General: Well-nourished, well-developed in no acute distress.  Eyes: No icterus. Conjunctivae pink. Neuro: Alert and oriented x 3.  Grossly intact. Skin: Warm and dry, no jaundice.   Psych: Alert and cooperative, normal mood and affect.   Imaging Studies: DG Chest 2 View  Result Date: 01/19/2023 CLINICAL DATA:  Shortness of breath, sweats, fever EXAM: CHEST - 2 VIEW COMPARISON:  Chest radiograph 10/10/2017 FINDINGS: The cardiomediastinal silhouette is normal There is no focal consolidation or pulmonary edema. There is no pleural effusion or pneumothorax There is no acute osseous abnormality. IMPRESSION: No radiographic evidence of acute cardiopulmonary process. Electronically Signed   By: Lesia Hausen M.D.   On: 01/19/2023 11:59    Assessment and Plan:   Victoria [name] is a 62 y.o. y/o female here to follow-up for NASH, nondiabetic.  Autoimmune workup was negative except a mildly elevated smooth muscle antibody with normal  immunoglobulins and ANA negative.  She has a history of hyperlipidemia, hypertension.  She has F2 fibrosis on FibroSure.  Discussed options about weight loss to reverse the features of hepatic steatosis, discussed the availability of a new drug resmetirom.  Which has been approved for F2 or F3 Nash.  She is keen on trying the new drug.  Plan 1.  Repeat smooth muscle antibody testing 2.  Labs to calculate fib score 3.  Repeat CMP 4.  Commence process to obtain the new drug for NASH. 5.  Hep A and B vaccine 6.  She should continue to try and lose weight which I discussed with her control cardiovascular risk factors such as hyperlipidemia hypertension and continue monitoring for diabetes  Dr Wyline Mood  MD,MRCP [facility]) Follow up in 3-4 months

## 2023-02-11 LAB — COMPREHENSIVE METABOLIC PANEL
ALT: 102 IU/L — ABNORMAL HIGH (ref 0–32)
AST: 102 IU/L — ABNORMAL HIGH (ref 0–40)
Albumin: 4.3 g/dL (ref 3.9–4.9)
Alkaline Phosphatase: 124 IU/L — ABNORMAL HIGH (ref 44–121)
BUN/Creatinine Ratio: 10 — ABNORMAL LOW (ref 12–28)
BUN: 10 mg/dL (ref 8–27)
Bilirubin Total: 0.5 mg/dL (ref 0.0–1.2)
CO2: 19 mmol/L — ABNORMAL LOW (ref 20–29)
Calcium: 9.2 mg/dL (ref 8.7–10.3)
Chloride: 103 mmol/L (ref 96–106)
Creatinine, Ser: 1.05 mg/dL — ABNORMAL HIGH (ref 0.57–1.00)
Globulin, Total: 2.7 g/dL (ref 1.5–4.5)
Glucose: 163 mg/dL — ABNORMAL HIGH (ref 70–99)
Potassium: 4 mmol/L (ref 3.5–5.2)
Sodium: 140 mmol/L (ref 134–144)
Total Protein: 7 g/dL (ref 6.0–8.5)
eGFR: 60 mL/min/{1.73_m2} (ref >59)

## 2023-02-11 LAB — CBC WITH DIFFERENTIAL/PLATELET
Basophils Absolute: 0 10*3/uL (ref 0.0–0.2)
Basos: 1 %
EOS (ABSOLUTE): 0.2 10*3/uL (ref 0.0–0.4)
Eos: 3 %
Hematocrit: 46.6 % (ref 34.0–46.6)
Hemoglobin: 15.2 g/dL (ref 11.1–15.9)
Immature Grans (Abs): 0 10*3/uL (ref 0.0–0.1)
Immature Granulocytes: 0 %
Lymphocytes Absolute: 2.9 10*3/uL (ref 0.7–3.1)
Lymphs: 46 %
MCH: 31.1 pg (ref 26.6–33.0)
MCHC: 32.6 g/dL (ref 31.5–35.7)
MCV: 96 fL (ref 79–97)
Monocytes Absolute: 0.4 10*3/uL (ref 0.1–0.9)
Monocytes: 7 %
Neutrophils Absolute: 2.7 10*3/uL (ref 1.4–7.0)
Neutrophils: 43 %
Platelets: 214 10*3/uL (ref 150–450)
RBC: 4.88 x10E6/uL (ref 3.77–5.28)
RDW: 13.5 % (ref 11.7–15.4)
WBC: 6.2 10*3/uL (ref 3.4–10.8)

## 2023-02-11 LAB — ANTI-SMOOTH MUSCLE ANTIBODY, IGG: Smooth Muscle Ab: 17 U (ref 0–19)

## 2023-02-17 ENCOUNTER — Telehealth: Payer: Self-pay

## 2023-02-17 NOTE — Telephone Encounter (Signed)
Tried to call patient and some one answered and hung up will send patient a mychart message

## 2023-02-17 NOTE — Telephone Encounter (Signed)
-----   Message from Wyline Mood sent at 02/17/2023 12:36 PM EDT ----- Needs hep A/B vaccine

## 2023-03-30 NOTE — Progress Notes (Deleted)
Established Patient Office Visit  Subjective    Patient ID: Victoria [name], female    DOB: [birth year]  Age: 62 y.o. MRN: [id number]  CC:  No chief complaint on file.   HPI [name] presents to for follow up on breathing and blood pressure. She is here with her husband. Was seen in the office 12/08/22 for worsening shortness of breath and mucus production. Does have a questionable history of fibrosis of the lung prior to moving here (had been commuting to see that specialist but is no longer). Was started on Prednisone, Duonebs PRN, tessalon perles at LOV.  Shortness of breath, wheezing and cough has improved but shortness of breath still present with exertion.  Patient using DuoNebs about once a day.  Continues to complain of myriad of symptoms.  Having numbness and tingling and weakness in the lower extremity, to the point where she feels like she cannot walk and will need a wheelchair.  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  Patient also had MRI of the brain which was normal other than mild small vessel ischemic disease.  Fibromyalgia/chronic pain: -Family history includes: sister with Lupus, daughter and granddaughter with [name], other sister diabetes inspidus, thyroid disease  -Dealing with severe pain for years, multiple joints with chronic pain and fatigue  -Labs from 8/23 showing ANA negative, inflammatory markers negative, TSH, free T3 and free T4 normal, RF normal. -Following with pain management  MDD: -Follows with Psychiatry -Mood status: fluctuating  -Current treatment: Cymbalta 60 mg, Trazodone 50 mg at night  -Symptom severity: severe  -Side effects: no Medication compliance: excellent compliance Previous psychiatric medications: has been on multiple and failed   Outpatient Encounter Medications as of 04/01/2023  Medication Sig   albuterol (VENTOLIN HFA) 108 (90 Base)  MCG/ACT inhaler Inhale 1-2 puffs into the lungs every 6 (six) hours as needed for wheezing or shortness of breath.   baclofen (LIORESAL) 10 MG tablet Take 10 mg by mouth 3 (three) times daily.   benzonatate (TESSALON) 100 MG capsule Take 1-2 capsules (100-200 mg total) by mouth 2 (two) times daily as needed for cough.   busPIRone (BUSPAR) 5 MG tablet TAKE 1 TABLET BY MOUTH THREE TIMES A DAY   cholecalciferol (VITAMIN D) 25 MCG tablet Take 1 tablet (1,000 Units total) by mouth daily.   conjugated estrogens (PREMARIN) vaginal cream Premarin 0.625 mg/gram vaginal cream  INSERT INTO THE VAGINA EVERY OTHER DAY.   DULoxetine (CYMBALTA) 30 MG capsule Take 30 mg by mouth 2 (two) times daily.   fluticasone (FLONASE) 50 MCG/ACT nasal spray SPRAY 1 SPRAY INTO EACH NOSTRIL EVERY DAY AS NEEDED   hydrocortisone 2.5 % cream APPLY TO AFFECTED AREA TWICE A DAY   hydrOXYzine (ATARAX) 25 MG tablet TAKE 1 TABLET (25 MG TOTAL) BY MOUTH DAILY AS NEEDED. FOR ANXIETY   ipratropium-albuterol (DUONEB) 0.5-2.5 (3) MG/3ML SOLN Take 3 mLs by nebulization 3 (three) times daily as needed (cough wheeze SOB bronchitis/asthma symptoms).   ketoconazole (NIZORAL) 2 % cream Apply 1 Application topically daily.   linaclotide (LINZESS) 290 MCG CAPS capsule Take 1 capsule (290 mcg total) by mouth daily.   MELATONIN PO Take 10 mg by mouth at bedtime.   meloxicam (MOBIC) 15 MG tablet Take 15 mg by mouth daily.   ondansetron (ZOFRAN) 8 MG tablet Take 1 tablet (8 mg total) by mouth every 8 (eight) hours as needed for nausea or vomiting.   Oxycodone  HCl 10 MG TABS Take 1 tablet by mouth every 6 (six) hours as needed.   pantoprazole (PROTONIX) 40 MG tablet TAKE 1 TABLET BY MOUTH EVERY DAY IN THE MORNING   polyvinyl alcohol (LIQUIFILM TEARS) 1.4 % ophthalmic solution Place 1 drop into both eyes 3 (three) times daily.   predniSONE (DELTASONE) 20 MG tablet Take 2 tablets (40 mg total) by mouth daily.   pregabalin (LYRICA) 150 MG capsule Take  150 mg by mouth 3 (three) times daily.   Respiratory Therapy Supplies (NEBULIZER/TUBING/MOUTHPIECE) KIT Disp one nebulizer machine, tubing set and mouthpiece kit   umeclidinium-vilanterol (ANORO ELLIPTA) 62.5-25 MCG/ACT AEPB Inhale 1 puff into the lungs daily at 6 (six) AM.   No facility-administered encounter medications on file as of 04/01/2023.    Past Medical History:  Diagnosis Date   Asthma    Cervical spinal stenosis    Fatty liver    GERD (gastroesophageal reflux disease)    Hypercholesterolemia    Hypertension    Lumbar stenosis    Lyme disease    Melanocarcinoma (HCC)    Osteoarthritis of both hips    Psychotic disorder (HCC)    Pulmonary fibrosis (HCC)    Sleep apnea     Past Surgical History:  Procedure Laterality Date   ABDOMINAL HYSTERECTOMY     APPENDECTOMY     BREAST EXCISIONAL BIOPSY Right 1997   neg   ESOPHAGOGASTRODUODENOSCOPY (EGD) WITH PROPOFOL N/A 09/20/2022   Procedure: ESOPHAGOGASTRODUODENOSCOPY (EGD) WITH PROPOFOL;  Surgeon: Wyline Mood, MD;  Location: [facility] ENDOSCOPY;  Service: Gastroenterology;  Laterality: N/A;   HIATAL HERNIA REPAIR     Jan 2017   LAPAROSCOPY      Family History  Problem Relation Age of Onset   Alcohol abuse Mother    Arthritis Mother    Heart disease Mother    Liver disease Mother    Osteoporosis Mother    Clotting disorder Mother    Diabetes Father    Arthritis Father    Stroke Father    Sarcoidosis Father    Heart failure Father    Bipolar disorder Sister    Schizophrenia Sister    Depression Sister    Anxiety disorder Sister    Alcohol abuse Sister    Diabetes Sister    Arthritis Sister    Thyroid disease Sister    Lupus Sister    Depression Sister    Anxiety disorder Sister    Alcohol abuse Sister    Depression Sister    Anxiety disorder Sister    Ehlers-Danlos syndrome Daughter     Social History   Socioeconomic History   Marital status: Married    Spouse name: bruce   Number of children: 1    Years of education: Not on file   Highest education level: Associate degree: academic program  Occupational History   Occupation: home maker  Tobacco Use   Smoking status: Former   Smokeless tobacco: Never  Advertising account planner   Vaping status: Never Used  Substance and Sexual Activity   Alcohol use: No    Alcohol/week: 0.0 standard drinks of alcohol   Drug use: No    Comment: No use of illict drugs.   Sexual activity: Not Currently  Other Topics Concern   Not on file  Social History Narrative   Lives with husband at home.   Social Determinants of Health   Financial Resource Strain: Low Risk  (08/19/2022)   Overall Financial Resource Strain (CARDIA)    Difficulty of  Paying Living Expenses: Not hard at all  Food Insecurity: No Food Insecurity (08/19/2022)   Hunger Vital Sign    Worried About Running Out of Food in the Last Year: Never true    Ran Out of Food in the Last Year: Never true  Transportation Needs: No Transportation Needs (08/19/2022)   PRAPARE - Administrator, Civil Service (Medical): No    Lack of Transportation (Non-Medical): No  Physical Activity: Inactive (08/19/2022)   Exercise Vital Sign    Days of Exercise per Week: 0 days    Minutes of Exercise per Session: 0 min  Stress: Stress Concern Present (08/19/2022)   [employer] of Occupational Health - Occupational Stress Questionnaire    Feeling of Stress : To some extent  Social Connections: Moderately Isolated (08/19/2022)   Social Connection and Isolation Panel [NHANES]    Frequency of Communication with Friends and Family: More than three times a week    Frequency of Social Gatherings with Friends and Family: Never    Attends Religious Services: Never    Database administrator or Organizations: No    Attends Banker Meetings: Never    Marital Status: Married  Catering manager Violence: Not At Risk (08/19/2022)   Humiliation, Afraid, Rape, and Kick questionnaire    Fear of Current or  Ex-Partner: No    Emotionally Abused: No    Physically Abused: No    Sexually Abused: No    Review of Systems  Unable to perform ROS: Other      Objective    There were no vitals taken for this visit.  Physical Exam Constitutional:      Appearance: Normal appearance.  HENT:     Head: Normocephalic and atraumatic.  Eyes:     Conjunctiva/sclera: Conjunctivae normal.  Cardiovascular:     Rate and Rhythm: Normal rate and regular rhythm.  Pulmonary:     Effort: Pulmonary effort is normal.     Breath sounds: Normal breath sounds. No wheezing, rhonchi or rales.  Skin:    General: Skin is warm and dry.     Comments: Tinea rash on chest wall  Neurological:     General: No focal deficit present.     Mental Status: She is alert. Mental status is at baseline.  Psychiatric:        Mood and Affect: Mood normal.        Behavior: Behavior normal.       Assessment & Plan:   1. Mild persistent asthma with acute exacerbation: Pulmonary exam normal here today.  Patient has questionable history of pulmonary fibrosis, I have never seen any documentation regarding this from her previous physicians.  Will start her on a maintenance inhaler to use daily can continue to use DuoNebs as needed.  Will obtain chest x-ray today.   - umeclidinium-vilanterol (ANORO ELLIPTA) 62.5-25 MCG/ACT AEPB; Inhale 1 puff into the lungs daily at 6 (six) AM.  Dispense: 14 each; Refill: 1  2. Transient weakness of left lower extremity: Reviewed neurology's note from 11/25/2022, reviewed EMG results and MRI brain results.  As discussed with the patient there is nothing to explain her symptoms, fibromyalgia and polypharmacy may be contributing.  3. Tinea corporis/Skin exam, screening for cancer: Tinea rash on chest will prescribe antifungal cream.  Patient wanting to be referred to dermatology for annual skin exams.  - ketoconazole (NIZORAL) 2 % cream; Apply 1 Application topically daily.  Dispense: 15 g; Refill:  0 - Ambulatory  referral to Dermatology  No follow-ups on file.   Margarita Mail, DO

## 2023-04-01 ENCOUNTER — Ambulatory Visit: Payer: Medicare HMO | Admitting: Internal Medicine

## 2023-04-06 ENCOUNTER — Other Ambulatory Visit: Payer: Self-pay | Admitting: Internal Medicine

## 2023-04-06 DIAGNOSIS — Z1231 Encounter for screening mammogram for malignant neoplasm of breast: Secondary | ICD-10-CM

## 2023-04-17 NOTE — Progress Notes (Unsigned)
Established Patient Office Visit  Subjective    Patient ID: Victoria [name], female    DOB: [birth year]  Age: 62 y.o. MRN: [id number]  CC:  No chief complaint on file.   HPI Victoria [name] presents to for follow up on breathing and blood pressure. She is here with her husband. Was seen in the office 12/08/22 for worsening shortness of breath and mucus production. Does have a questionable history of fibrosis of the lung prior to moving here (had been commuting to see that specialist but is no longer). Was started on Prednisone, Duonebs PRN, tessalon perles at LOV.  Shortness of breath, wheezing and cough has improved but shortness of breath still present with exertion.  Patient using DuoNebs about once a day.  Continues to complain of myriad of symptoms.  Having numbness and tingling and weakness in the lower extremity, to the point where she feels like she cannot walk and will need a wheelchair.  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  Patient also had MRI of the brain which was normal other than mild small vessel ischemic disease.  Fibromyalgia/chronic pain: -Family history includes: sister with Lupus, daughter and granddaughter with [name], other sister diabetes inspidus, thyroid disease  -Dealing with severe pain for years, multiple joints with chronic pain and fatigue  -Labs from 8/23 showing ANA negative, inflammatory markers negative, TSH, free T3 and free T4 normal, RF normal. -Following with pain management  MDD: -Follows with Psychiatry -Mood status: fluctuating  -Current treatment: Cymbalta 60 mg, Trazodone 50 mg at night  -Symptom severity: severe  -Side effects: no Medication compliance: excellent compliance Previous psychiatric medications: has been on multiple and failed   Outpatient Encounter Medications as of 04/18/2023  Medication Sig   albuterol (VENTOLIN HFA) 108 (90 Base)  MCG/ACT inhaler Inhale 1-2 puffs into the lungs every 6 (six) hours as needed for wheezing or shortness of breath.   baclofen (LIORESAL) 10 MG tablet Take 10 mg by mouth 3 (three) times daily.   benzonatate (TESSALON) 100 MG capsule Take 1-2 capsules (100-200 mg total) by mouth 2 (two) times daily as needed for cough.   busPIRone (BUSPAR) 5 MG tablet TAKE 1 TABLET BY MOUTH THREE TIMES A DAY   cholecalciferol (VITAMIN D) 25 MCG tablet Take 1 tablet (1,000 Units total) by mouth daily.   conjugated estrogens (PREMARIN) vaginal cream Premarin 0.625 mg/gram vaginal cream  INSERT INTO THE VAGINA EVERY OTHER DAY.   DULoxetine (CYMBALTA) 30 MG capsule Take 30 mg by mouth 2 (two) times daily.   fluticasone (FLONASE) 50 MCG/ACT nasal spray SPRAY 1 SPRAY INTO EACH NOSTRIL EVERY DAY AS NEEDED   hydrocortisone 2.5 % cream APPLY TO AFFECTED AREA TWICE A DAY   ipratropium-albuterol (DUONEB) 0.5-2.5 (3) MG/3ML SOLN Take 3 mLs by nebulization 3 (three) times daily as needed (cough wheeze SOB bronchitis/asthma symptoms).   ketoconazole (NIZORAL) 2 % cream Apply 1 Application topically daily.   linaclotide (LINZESS) 290 MCG CAPS capsule Take 1 capsule (290 mcg total) by mouth daily.   MELATONIN PO Take 10 mg by mouth at bedtime.   meloxicam (MOBIC) 15 MG tablet Take 15 mg by mouth daily.   ondansetron (ZOFRAN) 8 MG tablet Take 1 tablet (8 mg total) by mouth every 8 (eight) hours as needed for nausea or vomiting.   Oxycodone HCl 10 MG TABS Take 1 tablet by mouth every 6 (six) hours as needed.   pantoprazole (PROTONIX) 40  MG tablet TAKE 1 TABLET BY MOUTH EVERY DAY IN THE MORNING   polyvinyl alcohol (LIQUIFILM TEARS) 1.4 % ophthalmic solution Place 1 drop into both eyes 3 (three) times daily.   predniSONE (DELTASONE) 20 MG tablet Take 2 tablets (40 mg total) by mouth daily.   pregabalin (LYRICA) 150 MG capsule Take 150 mg by mouth 3 (three) times daily.   Respiratory Therapy Supplies (NEBULIZER/TUBING/MOUTHPIECE) KIT  Disp one nebulizer machine, tubing set and mouthpiece kit   umeclidinium-vilanterol (ANORO ELLIPTA) 62.5-25 MCG/ACT AEPB Inhale 1 puff into the lungs daily at 6 (six) AM.   No facility-administered encounter medications on file as of 04/18/2023.    Past Medical History:  Diagnosis Date   Asthma    Cervical spinal stenosis    Fatty liver    GERD (gastroesophageal reflux disease)    Hypercholesterolemia    Hypertension    Lumbar stenosis    Lyme disease    Melanocarcinoma (HCC)    Osteoarthritis of both hips    Psychotic disorder (HCC)    Pulmonary fibrosis (HCC)    Sleep apnea     Past Surgical History:  Procedure Laterality Date   ABDOMINAL HYSTERECTOMY     APPENDECTOMY     BREAST EXCISIONAL BIOPSY Right 1997   neg   ESOPHAGOGASTRODUODENOSCOPY (EGD) WITH PROPOFOL N/A 09/20/2022   Procedure: ESOPHAGOGASTRODUODENOSCOPY (EGD) WITH PROPOFOL;  Surgeon: Wyline Mood, MD;  Location: [facility] ENDOSCOPY;  Service: Gastroenterology;  Laterality: N/A;   HIATAL HERNIA REPAIR     Jan 2017   LAPAROSCOPY      Family History  Problem Relation Age of Onset   Alcohol abuse Mother    Arthritis Mother    Heart disease Mother    Liver disease Mother    Osteoporosis Mother    Clotting disorder Mother    Diabetes Father    Arthritis Father    Stroke Father    Sarcoidosis Father    Heart failure Father    Bipolar disorder Sister    Schizophrenia Sister    Depression Sister    Anxiety disorder Sister    Alcohol abuse Sister    Diabetes Sister    Arthritis Sister    Thyroid disease Sister    Lupus Sister    Depression Sister    Anxiety disorder Sister    Alcohol abuse Sister    Depression Sister    Anxiety disorder Sister    Ehlers-Danlos syndrome Daughter     Social History   Socioeconomic History   Marital status: Married    Spouse name: bruce   Number of children: 1   Years of education: Not on file   Highest education level: Associate degree: academic program   Occupational History   Occupation: home maker  Tobacco Use   Smoking status: Former   Smokeless tobacco: Never  Advertising account planner   Vaping status: Never Used  Substance and Sexual Activity   Alcohol use: No    Alcohol/week: 0.0 standard drinks of alcohol   Drug use: No    Comment: No use of illict drugs.   Sexual activity: Not Currently  Other Topics Concern   Not on file  Social History Narrative   Lives with husband at home.   Social Determinants of Health   Financial Resource Strain: Low Risk  (08/19/2022)   Overall Financial Resource Strain (CARDIA)    Difficulty of Paying Living Expenses: Not hard at all  Food Insecurity: No Food Insecurity (08/19/2022)   Hunger Vital Sign  Worried About Programme researcher, broadcasting/film/video in the Last Year: Never true    Ran Out of Food in the Last Year: Never true  Transportation Needs: No Transportation Needs (08/19/2022)   PRAPARE - Administrator, Civil Service (Medical): No    Lack of Transportation (Non-Medical): No  Physical Activity: Inactive (08/19/2022)   Exercise Vital Sign    Days of Exercise per Week: 0 days    Minutes of Exercise per Session: 0 min  Stress: Stress Concern Present (08/19/2022)   [employer] of Occupational Health - Occupational Stress Questionnaire    Feeling of Stress : To some extent  Social Connections: Moderately Isolated (08/19/2022)   Social Connection and Isolation Panel [NHANES]    Frequency of Communication with Friends and Family: More than three times a week    Frequency of Social Gatherings with Friends and Family: Never    Attends Religious Services: Never    Database administrator or Organizations: No    Attends Banker Meetings: Never    Marital Status: Married  Catering manager Violence: Not At Risk (08/19/2022)   Humiliation, Afraid, Rape, and Kick questionnaire    Fear of Current or Ex-Partner: No    Emotionally Abused: No    Physically Abused: No    Sexually Abused: No     Review of Systems  Unable to perform ROS: Other      Objective    There were no vitals taken for this visit.  Physical Exam Constitutional:      Appearance: Normal appearance.  HENT:     Head: Normocephalic and atraumatic.  Eyes:     Conjunctiva/sclera: Conjunctivae normal.  Cardiovascular:     Rate and Rhythm: Normal rate and regular rhythm.  Pulmonary:     Effort: Pulmonary effort is normal.     Breath sounds: Normal breath sounds. No wheezing, rhonchi or rales.  Skin:    General: Skin is warm and dry.     Comments: Tinea rash on chest wall  Neurological:     General: No focal deficit present.     Mental Status: She is alert. Mental status is at baseline.  Psychiatric:        Mood and Affect: Mood normal.        Behavior: Behavior normal.       Assessment & Plan:   1. Mild persistent asthma with acute exacerbation: Pulmonary exam normal here today.  Patient has questionable history of pulmonary fibrosis, I have never seen any documentation regarding this from her previous physicians.  Will start her on a maintenance inhaler to use daily can continue to use DuoNebs as needed.  Will obtain chest x-ray today.   - umeclidinium-vilanterol (ANORO ELLIPTA) 62.5-25 MCG/ACT AEPB; Inhale 1 puff into the lungs daily at 6 (six) AM.  Dispense: 14 each; Refill: 1  2. Transient weakness of left lower extremity: Reviewed neurology's note from 11/25/2022, reviewed EMG results and MRI brain results.  As discussed with the patient there is nothing to explain her symptoms, fibromyalgia and polypharmacy may be contributing.  3. Tinea corporis/Skin exam, screening for cancer: Tinea rash on chest will prescribe antifungal cream.  Patient wanting to be referred to dermatology for annual skin exams.  - ketoconazole (NIZORAL) 2 % cream; Apply 1 Application topically daily.  Dispense: 15 g; Refill: 0 - Ambulatory referral to Dermatology  No follow-ups on file.   Margarita Mail,  DO

## 2023-04-18 ENCOUNTER — Encounter: Payer: Self-pay | Admitting: Internal Medicine

## 2023-04-18 ENCOUNTER — Ambulatory Visit (INDEPENDENT_AMBULATORY_CARE_PROVIDER_SITE_OTHER): Payer: Medicare HMO | Admitting: Internal Medicine

## 2023-04-18 VITALS — BP 132/84 | HR 95 | Temp 97.9°F | Resp 18 | Ht 64.5 in | Wt 213.2 lb

## 2023-04-18 DIAGNOSIS — Z23 Encounter for immunization: Secondary | ICD-10-CM

## 2023-04-18 DIAGNOSIS — M48061 Spinal stenosis, lumbar region without neurogenic claudication: Secondary | ICD-10-CM

## 2023-04-18 DIAGNOSIS — F333 Major depressive disorder, recurrent, severe with psychotic symptoms: Secondary | ICD-10-CM | POA: Diagnosis not present

## 2023-04-18 DIAGNOSIS — K7581 Nonalcoholic steatohepatitis (NASH): Secondary | ICD-10-CM

## 2023-04-18 DIAGNOSIS — R21 Rash and other nonspecific skin eruption: Secondary | ICD-10-CM

## 2023-04-18 DIAGNOSIS — R5382 Chronic fatigue, unspecified: Secondary | ICD-10-CM

## 2023-04-18 DIAGNOSIS — R7309 Other abnormal glucose: Secondary | ICD-10-CM | POA: Diagnosis not present

## 2023-04-18 DIAGNOSIS — Z13220 Encounter for screening for lipoid disorders: Secondary | ICD-10-CM

## 2023-04-18 NOTE — Patient Instructions (Addendum)
It was great seeing you today!  Plan discussed at today's visit: -Blood work ordered today, results will be uploaded to MyChart. Please be fasting 8 - 12 hours. Lab hours Monday - Friday 8 - 11:30 and 1:30 - 3:30  -Referrals to Dermatology, Neurosurgery and Psychiatry placed today   Follow up in: 6 months  Take care and let us know if you have any questions or concerns prior to your next visit.  Dr. Caralee Ates

## 2023-04-20 ENCOUNTER — Telehealth: Payer: Self-pay | Admitting: Internal Medicine

## 2023-04-20 NOTE — Telephone Encounter (Signed)
Copied from CRM [id number]. Topic: General - Other >> Apr 19, 2023  4:35 PM Turkey B wrote: Reason for CRM: Inetta Fermo from [place]  from [facility], says didn't receive referral for ENT, so she is wondering does pt really need this for them, she mentioned dermatology

## 2023-04-21 ENCOUNTER — Encounter: Payer: Self-pay | Admitting: Internal Medicine

## 2023-04-21 DIAGNOSIS — D492 Neoplasm of unspecified behavior of bone, soft tissue, and skin: Secondary | ICD-10-CM | POA: Diagnosis not present

## 2023-04-21 DIAGNOSIS — L905 Scar conditions and fibrosis of skin: Secondary | ICD-10-CM | POA: Diagnosis not present

## 2023-04-21 DIAGNOSIS — L821 Other seborrheic keratosis: Secondary | ICD-10-CM | POA: Diagnosis not present

## 2023-04-21 DIAGNOSIS — L570 Actinic keratosis: Secondary | ICD-10-CM | POA: Diagnosis not present

## 2023-04-21 DIAGNOSIS — L820 Inflamed seborrheic keratosis: Secondary | ICD-10-CM | POA: Diagnosis not present

## 2023-04-21 DIAGNOSIS — L814 Other melanin hyperpigmentation: Secondary | ICD-10-CM | POA: Diagnosis not present

## 2023-05-05 ENCOUNTER — Encounter: Payer: Self-pay | Admitting: Neurosurgery

## 2023-05-05 ENCOUNTER — Ambulatory Visit: Payer: Medicare HMO | Admitting: Neurosurgery

## 2023-05-05 VITALS — BP 132/82 | Ht 65.0 in | Wt 213.0 lb

## 2023-05-05 DIAGNOSIS — M4807 Spinal stenosis, lumbosacral region: Secondary | ICD-10-CM

## 2023-05-05 DIAGNOSIS — M5416 Radiculopathy, lumbar region: Secondary | ICD-10-CM

## 2023-05-05 NOTE — Progress Notes (Signed)
Follow-up note: Referring Physician:  Margarita Mail, DO [address] [place],  [place] [address]  Primary Physician:  Margarita Mail, DO  Chief Complaint:  ongoing lumbar complaints  History of Present Illness: Victoria [name] is a 62 y.o. female who presents with the chief complaint of ongoing lumbar complaints.  She underwent Bilateral S1 transforaminal epidural injections with Dr. Yves Dill on 11/20/21.  She states that this is injection did not provide her with any relief and she has had worsening low back and bilateral radiating leg pain over the last year and a half.  She is ready to consider surgical interventions.  10/29/21 Ms. Poquette is a 62 y.o presenting today via telephone visit to review her cervical, thoracic, and lumbar MRIs. She states that she continues to have the same symptoms she is having at her visit in April however they have increased in severity making it difficult for her to ambulate. She also endorses a full body rash with associated pustules. She has not followed up with her primary care provider about this recently.   09/25/2021 Ms. Aleasha Felling is a 62 y.o with a history of SVT, obesity, history of melanoma, endometriosis, convulsions, fibrosis of the lung, depression, IBS, non alcoholic steatohepatitis, pernicious anemia, osteopenia, prediabetes, traumatic brain injury, stage 3 chronic kidney disease, bipolar, dyslipidemia, hypertension, asthma, ADHD, the anxiety, sleep apnea, hiatal hernia, fibromyalgia and GERD who is here today for further evaluation of her lumbar spine. She had a longstanding history of lumbosacral complaints which has been treated with pain management in the past. She also states that she had a MILD procedure in 2021 in [place] which did not provide her with any substantial relief. She states that she has recent relocated and is looking for further evaluation and treatment. She states that she was previously  scheduled for lumbar surgery however it was canceled due to her chronic kidney disease. Today she reports constant low back pain that is worse with any sort of activities including lifting her arms up, sitting up, standing, walking which will result in spasms in her low back. She does also endorse some mid back pain. She has numbness and pain that radiates into her lower abdomen with associated numbness in her anterior thighs and perineum. She does endorse urge type incontinence of both bowel and bladder for about the last 10 years. In addition to the symptoms she reports pain that radiates down the posterior aspects of both of her legs that she describes as sharp and shooting. She also endorses pain that wraps around her abdomen bilaterally and causes a tightness in her upper and lower abdomen with associated mid back pain. In addition to these complaints she describes pain in her neck with a sensation as though her neck bones are popping out of place with pain that radiates down both of her arms worse on the right than the left and into her hands. She also endorses decreased dexterity. Unfortunately she was about 15 minutes late for her appointment today which was only scheduled to discuss her back and we were not able to discuss her cervical spine and more specific detail due to her lumbar spine being her more pressing concern and time constraints. Overall her symptoms are drastically affecting her quality of life and have decreased her ability to participate in physical activity which is resulted in weight gain. She has seen pain management and has recently had an increase in her oxycodone which has provided some mild relief.   Review of  Systems:  A 10 point review of systems is negative, and the pertinent positives and negatives detailed in the HPI.  Past Medical History: Past Medical History:  Diagnosis Date   Asthma    Cervical spinal stenosis    Fatty liver    GERD (gastroesophageal reflux  disease)    Hypercholesterolemia    Hypertension    Lumbar stenosis    Lyme disease    Melanocarcinoma (HCC)    Osteoarthritis of both hips    Psychotic disorder (HCC)    Pulmonary fibrosis (HCC)    Sleep apnea     Past Surgical History: Past Surgical History:  Procedure Laterality Date   ABDOMINAL HYSTERECTOMY     APPENDECTOMY     BREAST EXCISIONAL BIOPSY Right 1997   neg   ESOPHAGOGASTRODUODENOSCOPY (EGD) WITH PROPOFOL N/A 09/20/2022   Procedure: ESOPHAGOGASTRODUODENOSCOPY (EGD) WITH PROPOFOL;  Surgeon: Wyline Mood, MD;  Location: [facility] ENDOSCOPY;  Service: Gastroenterology;  Laterality: N/A;   HIATAL HERNIA REPAIR     Jan 2017   LAPAROSCOPY      Allergies: Allergies as of 05/05/2023 - Review Complete 05/05/2023  Allergen Reaction Noted   Atorvastatin  09/29/2021   Sertraline Hives and Shortness Of Breath 12/08/2006   Amoxicillin  07/05/2015   Aspirin  07/05/2015   Cefuroxime Hives 12/08/2006   Codeine  07/05/2015   Hydrocodone Hives 09/29/2021   Penicillins  07/05/2015   Sulfa antibiotics  07/05/2015    Medications: Outpatient Encounter Medications as of 05/05/2023  Medication Sig   albuterol (VENTOLIN HFA) 108 (90 Base) MCG/ACT inhaler Inhale 1-2 puffs into the lungs every 6 (six) hours as needed for wheezing or shortness of breath.   [START ON 05/15/2023] buprenorphine (BUTRANS) 5 MCG/HR PTWK Place onto the skin.   cholecalciferol (VITAMIN D) 25 MCG tablet Take 1 tablet (1,000 Units total) by mouth daily.   conjugated estrogens (PREMARIN) vaginal cream Premarin 0.625 mg/gram vaginal cream  INSERT INTO THE VAGINA EVERY OTHER DAY.   fluticasone (FLONASE) 50 MCG/ACT nasal spray SPRAY 1 SPRAY INTO EACH NOSTRIL EVERY DAY AS NEEDED   hydrocortisone 2.5 % cream APPLY TO AFFECTED AREA TWICE A DAY   ipratropium-albuterol (DUONEB) 0.5-2.5 (3) MG/3ML SOLN Take 3 mLs by nebulization 3 (three) times daily as needed (cough wheeze SOB bronchitis/asthma symptoms).   ketoconazole  (NIZORAL) 2 % cream Apply 1 Application topically daily.   ondansetron (ZOFRAN) 8 MG tablet Take 1 tablet (8 mg total) by mouth every 8 (eight) hours as needed for nausea or vomiting.   Oxycodone HCl 10 MG TABS Take 10 mg by mouth every 6 (six) hours as needed.   pantoprazole (PROTONIX) 40 MG tablet TAKE 1 TABLET BY MOUTH EVERY DAY IN THE MORNING   polyvinyl alcohol (LIQUIFILM TEARS) 1.4 % ophthalmic solution Place 1 drop into both eyes 3 (three) times daily.   pregabalin (LYRICA) 150 MG capsule Take 150 mg by mouth 3 (three) times daily.   Respiratory Therapy Supplies (NEBULIZER/TUBING/MOUTHPIECE) KIT Disp one nebulizer machine, tubing set and mouthpiece kit   umeclidinium-vilanterol (ANORO ELLIPTA) 62.5-25 MCG/ACT AEPB Inhale 1 puff into the lungs daily at 6 (six) AM.   DULoxetine (CYMBALTA) 30 MG capsule Take 30 mg by mouth 2 (two) times daily.   [DISCONTINUED] baclofen (LIORESAL) 10 MG tablet Take 10 mg by mouth 3 (three) times daily.   [DISCONTINUED] busPIRone (BUSPAR) 5 MG tablet TAKE 1 TABLET BY MOUTH THREE TIMES A DAY   [DISCONTINUED] linaclotide (LINZESS) 290 MCG CAPS capsule Take 1 capsule (290  mcg total) by mouth daily.   [DISCONTINUED] MELATONIN PO Take 10 mg by mouth at bedtime.   [DISCONTINUED] meloxicam (MOBIC) 15 MG tablet Take 15 mg by mouth daily.   [DISCONTINUED] predniSONE (DELTASONE) 20 MG tablet Take 2 tablets (40 mg total) by mouth daily.   No facility-administered encounter medications on file as of 05/05/2023.    Social History: Social History   Tobacco Use   Smoking status: Former    Current packs/day: 0.00    Types: Cigarettes    Quit date: 1987    Years since quitting: 37.9   Smokeless tobacco: Never  Vaping Use   Vaping status: Never Used  Substance Use Topics   Alcohol use: No    Alcohol/week: 0.0 standard drinks of alcohol   Drug use: No    Comment: No use of illict drugs.    Family Medical History: Family History  Problem Relation Age of Onset    Alcohol abuse Mother    Arthritis Mother    Heart disease Mother    Liver disease Mother    Osteoporosis Mother    Clotting disorder Mother    Diabetes Father    Arthritis Father    Stroke Father    Sarcoidosis Father    Heart failure Father    Bipolar disorder Sister    Schizophrenia Sister    Depression Sister    Anxiety disorder Sister    Alcohol abuse Sister    Diabetes Sister    Arthritis Sister    Thyroid disease Sister    Lupus Sister    Depression Sister    Anxiety disorder Sister    Alcohol abuse Sister    Depression Sister    Anxiety disorder Sister    Ehlers-Danlos syndrome Daughter     Exam: Today's Vitals   05/05/23 1147 05/05/23 1212  BP: (!) 166/110 132/82  Weight: 96.6 kg   Height: 5\' 5"  (1.651 m)   PainSc: 5    PainLoc: Back    Body mass index is 35.45 kg/m.   General: A&O ROM of spine: limited due to pain.   Strength in the left lower extremity is EHL 5/5, Dorsiflexion 5/5, Plantar flexion 5/5, Hamstring 5/5, Quadricep 5/5, Iliopsoas 5/5. Strength in the right lower extremity is EHL 5/5, Dorsiflexion 5/5, Plantar flexion 5/5, Hamstring 5/5, Quadricep 5/5, Iliopsoas 5/5. Reflexes are 1+ and symmetric at the patella and achilles.   Bilateral lower extremity sensation is intact to light touch except decreased sensation to light touch on her lateral thighs left greater than right.  Patient ambulates with the assistance of a cane.  Imaging: 10/08/21 MRI MRI LUMBAR SPINE FINDINGS   Segmentation:  Examination degraded by motion artifact.   Standard segmentation. Lowest well-formed disc space labeled the L5-S1 level.   Alignment: 6 mm anterolisthesis of L4 on L5, chronic and facet mediated. Alignment otherwise normal with preservation of the normal lumbar lordosis.   Vertebrae: Vertebral body height maintained without acute or chronic fracture. Bone marrow signal intensity within normal limits. No discrete or worrisome osseous lesions. Reactive  marrow edema present about the L4-5 facets bilaterally due to facet arthritis. No other abnormal marrow edema.   Conus medullaris and cauda equina: Conus extends to the L1 level. Conus and cauda equina appear normal.   Paraspinal and other soft tissues: Unremarkable.   Disc levels:   L1-2:  Unremarkable.   L2-3: Mild annular disc bulge. No significant spinal stenosis. Foramina remain patent.   L3-4: Mild annular disc bulge.  Mild facet hypertrophy. No significant spinal stenosis. Foramina remain patent.   L4-5: 6 mm anterolisthesis. Associated degenerative intervertebral disc space narrowing with disc desiccation and broad posterior pseudo disc bulge/uncovering. Moderate facet and ligament flavum hypertrophy with associated trace joint effusions and reactive marrow edema. Resultant severe spinal stenosis with mild bilateral L4 foraminal narrowing, slightly worse on the right.   L5-S1: Negative interspace. Mild to moderate facet hypertrophy. No stenosis.   IMPRESSION: MRI CERVICAL SPINE IMPRESSION:   1. Mild-to-moderate degenerative spondylosis at C4-5 through C6-7 with resultant mild spinal stenosis at C5-6. No frank cord impingement. 2. Multifactorial degenerative changes with resultant multilevel foraminal narrowing as above. Notable findings include moderate to severe bilateral C6 foraminal stenosis with moderate left C7 foraminal narrowing.   MRI THORACIC SPINE IMPRESSION:   1. Mild for age spondylosis at T6-7 through T10-11, with single small right paracentral disc protrusion at T7-8. No significant stenosis or neural impingement. 2. Mild discogenic reactive endplate change with marrow edema about the T7-8, T9-10 and T10-11 interspaces. Findings could contribute to back pain.   MRI LUMBAR SPINE IMPRESSION:   1. 6 mm facet mediated anterolisthesis at L4-5 with resultant severe spinal stenosis, with mild bilateral L4 foraminal narrowing. 2. Mild to moderate  facet hypertrophy at L5-S1 without stenosis.     Electronically Signed   By: Rise Mu M.D.   On: 10/09/2021 06:20  I have personally reviewed the images and agree with the above interpretation.  Assessment and Plan: Ms. Richart is a pleasant 62 y.o. female with a longstanding history of lumbosacral complaints and a known history of severe spinal stenosis with ongoing back and radiating leg pain.  She has failed to improve with injections and is interested in considering further surgical intervention.  Because her MRI is more than a-year-old, I recommended updating this.  She has also not completed physical therapy in the last year.  She would like to return to [facility] as she has gone there in the past.  I placed a referral for this.  I will see her back via telephone visit in 6 to 8 weeks to discuss her progress and review her MRI results.  She was encouraged to call the office should she be discharged from physical therapy sooner or should she have any questions or concerns.  She expressed understanding and was in agreement with this plan.  I spent a total of 37 minutes in both face-to-face and non-face-to-face activities for this visit on the date of this encounter including review of records, review of previous imaging, discussion of symptoms, physical exam, order placement, and documentation  Manning Charity PA-C Neurosurgery

## 2023-05-14 ENCOUNTER — Ambulatory Visit
Admission: RE | Admit: 2023-05-14 | Discharge: 2023-05-14 | Disposition: A | Discharge status: Home / Self Care | Payer: Medicare HMO | Source: Ambulatory Visit | Attending: Neurosurgery

## 2023-05-14 DIAGNOSIS — M2548 Effusion, other site: Secondary | ICD-10-CM | POA: Diagnosis not present

## 2023-05-14 DIAGNOSIS — M4726 Other spondylosis with radiculopathy, lumbar region: Secondary | ICD-10-CM | POA: Diagnosis not present

## 2023-05-14 DIAGNOSIS — M4316 Spondylolisthesis, lumbar region: Secondary | ICD-10-CM | POA: Diagnosis not present

## 2023-05-14 DIAGNOSIS — M4727 Other spondylosis with radiculopathy, lumbosacral region: Secondary | ICD-10-CM | POA: Diagnosis not present

## 2023-05-14 DIAGNOSIS — M5416 Radiculopathy, lumbar region: Secondary | ICD-10-CM | POA: Insufficient documentation

## 2023-05-31 DIAGNOSIS — M5442 Lumbago with sciatica, left side: Secondary | ICD-10-CM | POA: Diagnosis not present

## 2023-05-31 DIAGNOSIS — G8929 Other chronic pain: Secondary | ICD-10-CM | POA: Diagnosis not present

## 2023-05-31 DIAGNOSIS — M5441 Lumbago with sciatica, right side: Secondary | ICD-10-CM | POA: Diagnosis not present

## 2023-05-31 DIAGNOSIS — Z760 Encounter for issue of repeat prescription: Secondary | ICD-10-CM | POA: Diagnosis not present

## 2023-05-31 DIAGNOSIS — Z9181 History of falling: Secondary | ICD-10-CM | POA: Diagnosis not present

## 2023-05-31 DIAGNOSIS — M5412 Radiculopathy, cervical region: Secondary | ICD-10-CM | POA: Diagnosis not present

## 2023-06-23 ENCOUNTER — Telehealth: Payer: Medicare HMO | Admitting: Neurosurgery

## 2023-06-24 ENCOUNTER — Ambulatory Visit (INDEPENDENT_AMBULATORY_CARE_PROVIDER_SITE_OTHER): Payer: Medicare HMO | Admitting: Neurosurgery

## 2023-06-24 DIAGNOSIS — M5442 Lumbago with sciatica, left side: Secondary | ICD-10-CM | POA: Diagnosis not present

## 2023-06-24 DIAGNOSIS — M5441 Lumbago with sciatica, right side: Secondary | ICD-10-CM

## 2023-06-24 DIAGNOSIS — M5416 Radiculopathy, lumbar region: Secondary | ICD-10-CM

## 2023-06-24 DIAGNOSIS — M4316 Spondylolisthesis, lumbar region: Secondary | ICD-10-CM

## 2023-06-24 NOTE — Progress Notes (Signed)
Neurosurgery Telephone (Audio-Only) Note  Requesting Provider     No referring provider defined for this encounter. T: N/A F:   Primary Care Provider Margarita Mail, DO [address] [place] [address] T: [phone] F: [phone]  Telehealth visit was conducted with Victoria [name], a 63 y.o. female via telephone.  History of Present Illness: Victoria [name] is a 63 y.o presenting today via telephone visit to review MRI results   05/05/23 Victoria [name] is a 63 y.o. female who presents with the chief complaint of ongoing lumbar complaints.  She underwent Bilateral S1 transforaminal epidural injections with Dr. Yves Dill on 11/20/21.  She states that this is injection did not provide her with any relief and she has had worsening low back and bilateral radiating leg pain over the last year and a half.  She is ready to consider surgical interventions.   10/29/21 Victoria [name] is a 63 y.o presenting today via telephone visit to review her cervical, thoracic, and lumbar MRIs. She states that she continues to have the same symptoms she is having at her visit in April however they have increased in severity making it difficult for her to ambulate. She also endorses a full body rash with associated pustules. She has not followed up with her primary care provider about this recently.    09/25/2021 Victoria [name] is a 63 y.o with a history of SVT, obesity, history of melanoma, endometriosis, convulsions, fibrosis of the lung, depression, IBS, non alcoholic steatohepatitis, pernicious anemia, osteopenia, prediabetes, traumatic brain injury, stage 3 chronic kidney disease, bipolar, dyslipidemia, hypertension, asthma, ADHD, the anxiety, sleep apnea, hiatal hernia, fibromyalgia and GERD who is here today for further evaluation of her lumbar spine. She had a longstanding history of lumbosacral complaints which has been treated with pain management in the past. She also  states that she had a MILD procedure in 2021 in [place] which did not provide her with any substantial relief. She states that she has recent relocated and is looking for further evaluation and treatment. She states that she was previously scheduled for lumbar surgery however it was canceled due to her chronic kidney disease. Today she reports constant low back pain that is worse with any sort of activities including lifting her arms up, sitting up, standing, walking which will result in spasms in her low back. She does also endorse some mid back pain. She has numbness and pain that radiates into her lower abdomen with associated numbness in her anterior thighs and perineum. She does endorse urge type incontinence of both bowel and bladder for about the last 10 years. In addition to the symptoms she reports pain that radiates down the posterior aspects of both of her legs that she describes as sharp and shooting. She also endorses pain that wraps around her abdomen bilaterally and causes a tightness in her upper and lower abdomen with associated mid back pain. In addition to these complaints she describes pain in her neck with a sensation as though her neck bones are popping out of place with pain that radiates down both of her arms worse on the right than the left and into her hands. She also endorses decreased dexterity. Unfortunately she was about 15 minutes late for her appointment today which was only scheduled to discuss her back and we were not able to discuss her cervical spine and more specific detail due to her lumbar spine being her more pressing concern and time constraints. Overall her symptoms are drastically affecting  her quality of life and have decreased her ability to participate in physical activity which is resulted in weight gain. She has seen pain management and has recently had an increase in her oxycodone which has provided some mild relief.   General Review of Systems:  A ROS was  performed including pertinent positive and negatives as documented.  All other systems are negative.   Prior to Admission medications   Medication Sig Start Date End Date Taking? Authorizing Provider  albuterol (VENTOLIN HFA) 108 (90 Base) MCG/ACT inhaler Inhale 1-2 puffs into the lungs every 6 (six) hours as needed for wheezing or shortness of breath. 12/08/22   Danelle Berry, PA-C  cholecalciferol (VITAMIN D) 25 MCG tablet Take 1 tablet (1,000 Units total) by mouth daily. 11/20/19   Clapacs, Jackquline Denmark, MD  conjugated estrogens (PREMARIN) vaginal cream Premarin 0.625 mg/gram vaginal cream  INSERT INTO THE VAGINA EVERY OTHER DAY. 04/09/20   [provider]  DULoxetine (CYMBALTA) 30 MG capsule Take 30 mg by mouth 2 (two) times daily. 01/27/23 04/27/23  [provider]  fluticasone (FLONASE) 50 MCG/ACT nasal spray SPRAY 1 SPRAY INTO EACH NOSTRIL EVERY DAY AS NEEDED 08/06/19   [provider]  hydrocortisone 2.5 % cream APPLY TO AFFECTED AREA TWICE A DAY    [provider]  ipratropium-albuterol (DUONEB) 0.5-2.5 (3) MG/3ML SOLN Take 3 mLs by nebulization 3 (three) times daily as needed (cough wheeze SOB bronchitis/asthma symptoms). 12/08/22   Danelle Berry, PA-C  ketoconazole (NIZORAL) 2 % cream Apply 1 Application topically daily. 12/30/22   Margarita Mail, DO  ondansetron (ZOFRAN) 8 MG tablet Take 1 tablet (8 mg total) by mouth every 8 (eight) hours as needed for nausea or vomiting. 06/18/22   Margarita Mail, DO  Oxycodone HCl 10 MG TABS Take 10 mg by mouth every 6 (six) hours as needed.    [provider]  pantoprazole (PROTONIX) 40 MG tablet TAKE 1 TABLET BY MOUTH EVERY DAY IN THE MORNING 08/23/22   Margarita Mail, DO  polyvinyl alcohol (LIQUIFILM TEARS) 1.4 % ophthalmic solution Place 1 drop into both eyes 3 (three) times daily. 11/19/19   Clapacs, Jackquline Denmark, MD  pregabalin (LYRICA) 150 MG capsule Take 150 mg by mouth 3 (three) times daily.    [provider]  Respiratory Therapy Supplies (NEBULIZER/TUBING/MOUTHPIECE) KIT Disp one nebulizer machine, tubing set and mouthpiece kit 12/08/22   Danelle Berry, PA-C  umeclidinium-vilanterol (ANORO ELLIPTA) 62.5-25 MCG/ACT AEPB Inhale 1 puff into the lungs daily at 6 (six) AM. 12/30/22   Margarita Mail, DO   DATA REVIEWED    Imaging Studies  05/14/23 MRI L spine IMPRESSION: 1. L4-5: Bilateral facet arthropathy with gaping, fluid-filled joints, worsened since 2023. Anterolisthesis of 6 mm because of this facet arthropathy, which would likely worsen with standing or flexion. Circumferential bulging of the disc. Severe multifactorial stenosis at this level that would likely worsen with standing or flexion. 2. L5-S1: Mild facet osteoarthritis. No stenosis.     Electronically Signed   By: Paulina Fusi M.D.   On: 05/29/2023 16:38  IMPRESSION  Victoria [name] is a 63 y.o. female who I performed a telephone encounter today for evaluation and management of: low back and bilateral sciatica and lumbar spondylolisthesis    PLAN  I spoke with Victoria [name] at length regarding her lumbar MRI findings. She has not yet started PT but will reach out to [facility] to get scheduled. I will touch base with her in a few weeks to  see how things are going. If she is discharged from PT or completes PT and continued to have symptoms I will get her scheduled to discuss surgical options. We briefly discussed her diagnosis of polyneuropathy as an underlying cause of some of her symptoms however given her spondy and significant back pain it is reasonable to consider that a lot of her lower extremity symptoms are likely due to her lumbar pathology. She was encouraged to call our office in the meantime with any questions or concerns.  No orders of the defined types were placed in this encounter.    DISPOSITION  Follow up: In person appointment in 6 weeks  Susanne Borders, PA   TELEPHONE DOCUMENTATION   This  visit was performed via telephone.  Patient location: home Provider location: office  I spent a total of 10 minutes non-face-to-face activities for this visit on the date of this encounter including review of current clinical condition and response to treatment.  The patient is aware of and accepts the limits of this telehealth visit.

## 2023-06-29 ENCOUNTER — Ambulatory Visit (INDEPENDENT_AMBULATORY_CARE_PROVIDER_SITE_OTHER): Payer: Medicare HMO | Admitting: Family Medicine

## 2023-06-29 ENCOUNTER — Encounter: Payer: Self-pay | Admitting: Family Medicine

## 2023-06-29 VITALS — BP 116/72 | HR 94 | Resp 18 | Ht 65.0 in | Wt 213.7 lb

## 2023-06-29 DIAGNOSIS — Z87891 Personal history of nicotine dependence: Secondary | ICD-10-CM | POA: Diagnosis not present

## 2023-06-29 DIAGNOSIS — J4531 Mild persistent asthma with (acute) exacerbation: Secondary | ICD-10-CM

## 2023-06-29 DIAGNOSIS — J42 Unspecified chronic bronchitis: Secondary | ICD-10-CM | POA: Diagnosis not present

## 2023-06-29 DIAGNOSIS — J8410 Pulmonary fibrosis, unspecified: Secondary | ICD-10-CM

## 2023-06-29 MED ORDER — BENZONATATE 100 MG PO CAPS: 100.0000 mg | ORAL_CAPSULE | Freq: Three times a day (TID) | ORAL | 0 refills | Status: DC | PRN

## 2023-06-29 MED ORDER — PREDNISONE 10 MG (21) PO TBPK: ORAL_TABLET | ORAL | 0 refills | Status: DC

## 2023-06-29 MED ORDER — TRELEGY ELLIPTA 200-62.5-25 MCG/ACT IN AEPB: 1.0000 | INHALATION_SPRAY | Freq: Every day | RESPIRATORY_TRACT | 2 refills | Status: DC

## 2023-06-29 MED ORDER — AZITHROMYCIN 250 MG PO TABS: ORAL_TABLET | ORAL | 0 refills | Status: AC

## 2023-06-29 NOTE — Progress Notes (Signed)
Name: Victoria [name]   MRN: [id number]    DOB: November 29, 1960   Date:06/29/2023       Progress Note  Subjective  Chief Complaint  Chief Complaint  Patient presents with   Nausea   Constipation   Abdominal Pain   Diarrhea    Back and forth with constipation   Cough    Has been diagnosed with bronchitis, cough on going for months and believes the cough and coughing up stuff has caused the abdominal pain along w nausea   Discussed the use of AI scribe software for clinical note transcription with the patient, who gave verbal consent to proceed.  History of Present Illness   The patient, with asthma and chronic bronchitis, presents with an acute asthma flare and chronic bronchitis exacerbation.  She has a history of asthma since childhood and chronic bronchitis, which has worsened over the last two years, requiring more frequent medication use. This is the third or fourth exacerbation since moving to [place]. She uses a nebulizer machien with albuterol and also has Ventolin prn, but these provide only temporary relief, lasting about an hour.. The cough is forceful enough to induce gagging and is associated with sweating and abdominal pain, likely due to the intensity of the coughing. She has not used the [employer] , states it was just a sample and ran out months ago   She was diagnosed with lung fibrosis years ago which may be contributing to her respiratory symptoms. She smoked a pack a day for about eleven years, starting at age 68, but quit smoking in her early twenties.  She has a history of IBS, which typically does not cause constipation but has been exacerbated by her current illness. She experiences chronic nausea and vomiting, and recent symptoms include diarrhea. She has not taken antibiotics in the past couple of months.  She also asked me about a chronic rash and neuropathy but reminded her that today is was an acute visit and needs to return to see PCP for her   chronic problems      Patient Active Problem List   Diagnosis Date Noted   Nausea and vomiting 09/20/2022   At low risk for fall 06/16/2022   Hyperlipidemia 09/29/2021   OSA (obstructive sleep apnea) 09/29/2021   Vitamin D deficiency 09/29/2021   Long term (current) use of opiate analgesic 04/06/2021   Issue of repeat prescription for medication 04/06/2021   SVT (supraventricular tachycardia) (HCC) 04/09/2020   Vaginal atrophy 04/09/2020   Class 1 obesity due to excess calories with serious comorbidity and body mass index (BMI) of 31.0 to 31.9 in adult 02/07/2020   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   Chronic fatigue 01/07/2020   History of melanoma 01/07/2020   Endometriosis 01/07/2020   Fibrosis of lung (HCC) 12/12/2019   Nonalcoholic steatohepatitis 12/12/2019   Convulsions (HCC) 12/12/2019   Irritable bowel syndrome 12/12/2019   Osteopenia 12/12/2019   Pernicious anemia 12/12/2019   Prediabetes 12/12/2019   Traumatic brain injury (HCC) 12/12/2019   Psychosis (HCC) 11/15/2019   Chronic pain syndrome 11/15/2019   Corneal abrasion 11/15/2019   Suicidal thoughts 11/15/2019   MDD (major depressive disorder), single episode, severe with psychosis (HCC) 11/14/2019   Migraine 07/30/2019   Stage 3a chronic kidney disease (HCC) 07/25/2019   Spinal stenosis of lumbar region with neurogenic claudication 07/17/2019   Skin lesion 01/02/2019   Spondylolisthesis of lumbar region 04/24/2018   Numbness and tingling in both hands 12/30/2017   Cervical radiculopathy 10/25/2017  Pain medication agreement 10/25/2017   Sacroiliitis (HCC) 05/03/2016   Neuropathy 01/06/2016   Right hip pain 01/06/2016   MDD (major depressive disorder), recurrent, severe, with psychosis (HCC)    Osteoarthritis of both hips 07/06/2015   Dyslipidemia 07/06/2015   GERD (gastroesophageal reflux disease) 07/06/2015   Asthma 07/06/2015   Bipolar I disorder, most recent episode manic, severe with psychotic  features (HCC) 07/06/2015   Attention-deficit hyperactivity disorder, predominantly inattentive type 02/25/2014   Elevation of levels of liver transaminase levels 01/02/2014   Impaired fasting glucose 01/02/2014   Melanosis coli 09/20/2012   Screening for colon cancer 09/07/2012   Lactose intolerance 03/06/2012   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   Mild persistent asthma 03/26/2009   Acquired absence of other genital organ(s) 10/30/2008   Hx of total hysterectomy 10/30/2008   Lateral epicondylitis, unspecified elbow 08/03/2006   Tinnitus, unspecified ear 08/11/2004    Past Surgical History:  Procedure Laterality Date   ABDOMINAL HYSTERECTOMY     APPENDECTOMY     BREAST EXCISIONAL BIOPSY Right 1997   neg   ESOPHAGOGASTRODUODENOSCOPY (EGD) WITH PROPOFOL N/A 09/20/2022   Procedure: ESOPHAGOGASTRODUODENOSCOPY (EGD) WITH PROPOFOL;  Surgeon: Wyline Mood, MD;  Location: [facility] ENDOSCOPY;  Service: Gastroenterology;  Laterality: N/A;   HIATAL HERNIA REPAIR     Jan 2017   LAPAROSCOPY      Family History  Problem Relation Age of Onset   Alcohol abuse Mother    Arthritis Mother    Heart disease Mother    Liver disease Mother    Osteoporosis Mother    Clotting disorder Mother    Diabetes Father    Arthritis Father    Stroke Father    Sarcoidosis Father    Heart failure Father    Bipolar disorder Sister    Schizophrenia Sister    Depression Sister    Anxiety disorder Sister    Alcohol abuse Sister    Diabetes Sister    Arthritis Sister    Thyroid disease Sister    Lupus Sister    Depression Sister    Anxiety disorder Sister    Alcohol abuse Sister    Depression Sister    Anxiety disorder Sister    Ehlers-Danlos syndrome Daughter     Social History   Tobacco Use   Smoking status: Former    Current packs/day: 0.00    Types: Cigarettes    Quit date: 1987    Years since quitting: 38.1   Smokeless tobacco: Never  Substance Use Topics   Alcohol use: No    Alcohol/week: 0.0  standard drinks of alcohol     Current Outpatient Medications:    albuterol (VENTOLIN HFA) 108 (90 Base) MCG/ACT inhaler, Inhale 1-2 puffs into the lungs every 6 (six) hours as needed for wheezing or shortness of breath., Disp: 18 g, Rfl: 1   buprenorphine (BUTRANS) 7.5 MCG/HR, Place onto the skin., Disp: , Rfl:    cholecalciferol (VITAMIN D) 25 MCG tablet, Take 1 tablet (1,000 Units total) by mouth daily., Disp: 30 tablet, Rfl: 1   conjugated estrogens (PREMARIN) vaginal cream, Premarin 0.625 mg/gram vaginal cream  INSERT INTO THE VAGINA EVERY OTHER DAY., Disp: , Rfl:    cyclobenzaprine (FLEXERIL) 5 MG tablet, Take by mouth., Disp: , Rfl:    fluticasone (FLONASE) 50 MCG/ACT nasal spray, SPRAY 1 SPRAY INTO EACH NOSTRIL EVERY DAY AS NEEDED, Disp: , Rfl:    gabapentin (NEURONTIN) 600 MG tablet, Take by mouth., Disp: , Rfl:    hydrocortisone 2.5 %  cream, APPLY TO AFFECTED AREA TWICE A DAY, Disp: , Rfl:    ipratropium-albuterol (DUONEB) 0.5-2.5 (3) MG/3ML SOLN, Take 3 mLs by nebulization 3 (three) times daily as needed (cough wheeze SOB bronchitis/asthma symptoms)., Disp: 180 mL, Rfl: 1   ketoconazole (NIZORAL) 2 % cream, Apply 1 Application topically daily., Disp: 15 g, Rfl: 0   MOVANTIK 25 MG TABS tablet, Take 25 mg by mouth daily., Disp: , Rfl:    ondansetron (ZOFRAN) 8 MG tablet, Take 1 tablet (8 mg total) by mouth every 8 (eight) hours as needed for nausea or vomiting., Disp: 20 tablet, Rfl: 0   Oxycodone HCl 10 MG TABS, Take 10 mg by mouth every 6 (six) hours as needed., Disp: , Rfl:    pantoprazole (PROTONIX) 40 MG tablet, TAKE 1 TABLET BY MOUTH EVERY DAY IN THE MORNING, Disp: 90 tablet, Rfl: 3   polyvinyl alcohol (LIQUIFILM TEARS) 1.4 % ophthalmic solution, Place 1 drop into both eyes 3 (three) times daily., Disp: 15 mL, Rfl: 1   pregabalin (LYRICA) 150 MG capsule, Take 150 mg by mouth 3 (three) times daily., Disp: , Rfl:    Respiratory Therapy Supplies (NEBULIZER/TUBING/MOUTHPIECE) KIT,  Disp one nebulizer machine, tubing set and mouthpiece kit, Disp: 1 kit, Rfl: 0   umeclidinium-vilanterol (ANORO ELLIPTA) 62.5-25 MCG/ACT AEPB, Inhale 1 puff into the lungs daily at 6 (six) AM., Disp: 14 each, Rfl: 1   DULoxetine (CYMBALTA) 30 MG capsule, Take 30 mg by mouth 2 (two) times daily., Disp: , Rfl:   Allergies  Allergen Reactions   Atorvastatin     Myalgias    Sertraline Hives and Shortness Of Breath   Amoxicillin    Aspirin    Cefuroxime Hives   Codeine    Hydrocodone Hives   Penicillins    Sulfa Antibiotics     I personally reviewed active problem list, medication list, allergies with the patient/caregiver today.   ROS  Ten systems reviewed and is negative except as mentioned in HPI    Objective  Vitals:   06/29/23 1543  BP: 116/72  Pulse: 94  Resp: 18  SpO2: 92%  Weight: 213 lb 11.2 oz (96.9 kg)  Height: 5\' 5"  (1.651 m)    Body mass index is 35.56 kg/m.  Physical Exam  Constitutional: Patient appears well-developed and well-nourished. Obese  No distress.  HEENT: head atraumatic, normocephalic, pupils equal and reactive to light,, neck supple Cardiovascular: Normal rate, regular rhythm and normal heart sounds.  No murmur heard. No BLE edema. Pulmonary/Chest: Effort normal , bilateral rhonchi. No respiratory distress. Abdominal: Soft.  There is no tenderness. Skin: rash on arms and anterior chest  Psychiatric: Patient has a normal mood and affect. behavior is normal. Judgment and thought content normal.   Diabetic Foot Exam:     PHQ2/9:    06/29/2023    3:40 PM 04/18/2023    3:41 PM 12/30/2022    2:22 PM 12/08/2022    3:37 PM 08/19/2022    1:48 PM  Depression screen PHQ 2/9  Decreased Interest 0 0 2 2 1   Down, Depressed, Hopeless 0 0 1 2 1   PHQ - 2 Score 0 0 3 4 2   Altered sleeping 0 0 1 0 2  Tired, decreased energy 0 0 1 0 2  Change in appetite 0 0 0 0 2  Feeling bad or failure about yourself  0 0 1 0 2  Trouble concentrating 0 0 0 0 2   Moving slowly or fidgety/restless 0 0  0 0 0  Suicidal thoughts 0 0 0 0 0  PHQ-9 Score 0 0 6 4 12   Difficult doing work/chores Not difficult at all Not difficult at all Somewhat difficult Somewhat difficult Somewhat difficult    phq 9 is negative  Fall Risk:    06/29/2023    3:40 PM 04/18/2023    3:40 PM 12/30/2022    2:20 PM 12/08/2022    3:37 PM 08/19/2022    1:46 PM  Fall Risk   Falls in the past year? 0 0 1 0 0  Number falls in past yr: 0 0 0 0 0  Injury with Fall? 0 0 0 0 0  Risk for fall due to : No Fall Risks   No Fall Risks No Fall Risks  Follow up Falls prevention discussed;Education provided;Falls evaluation completed   Falls prevention discussed;Education provided;Falls evaluation completed Education provided;Falls prevention discussed     Assessment and Plan    Asthma/Chronic Bronchitis Acute exacerbation with severe coughing, nausea, and vomiting. Albuterol nebulizer provides temporary relief. History of smoking, asthma, and lung fibrosis. -Start Trelegy (long-acting bronchodilator, anticholinergic, and corticosteroid) once daily. -Start Prednisone taper for 5 days, take with food and not before bedtime. -Start Z-Pak (Azithromycin) for its anti-inflammatory properties. -Start Benzonatate for cough suppression. -Refill Albuterol rescue inhaler. -Consider referral to pulmonologist if no improvement.  Abdominal Pain Likely secondary to severe coughing. -Management is focused on treating the underlying cause (asthma/chronic bronchitis exacerbation).  Follow-up Schedule a follow-up visit to address ongoing issues and evaluate response to new asthma/chronic bronchitis treatment regimen.

## 2023-07-08 ENCOUNTER — Other Ambulatory Visit: Payer: Self-pay

## 2023-07-08 ENCOUNTER — Other Ambulatory Visit (HOSPITAL_COMMUNITY)
Admission: RE | Admit: 2023-07-08 | Discharge: 2023-07-08 | Disposition: A | Discharge status: Home / Self Care | Payer: Medicare HMO | Source: Ambulatory Visit | Attending: Internal Medicine | Admitting: Internal Medicine

## 2023-07-08 ENCOUNTER — Encounter: Payer: Self-pay | Admitting: Internal Medicine

## 2023-07-08 ENCOUNTER — Ambulatory Visit: Payer: Medicare HMO | Admitting: Internal Medicine

## 2023-07-08 VITALS — BP 130/80 | HR 88 | Temp 97.9°F | Resp 16 | Ht 65.0 in | Wt 211.4 lb

## 2023-07-08 DIAGNOSIS — R5382 Chronic fatigue, unspecified: Secondary | ICD-10-CM | POA: Diagnosis not present

## 2023-07-08 DIAGNOSIS — R109 Unspecified abdominal pain: Secondary | ICD-10-CM

## 2023-07-08 DIAGNOSIS — R21 Rash and other nonspecific skin eruption: Secondary | ICD-10-CM | POA: Diagnosis not present

## 2023-07-08 DIAGNOSIS — R300 Dysuria: Secondary | ICD-10-CM

## 2023-07-08 DIAGNOSIS — K7581 Nonalcoholic steatohepatitis (NASH): Secondary | ICD-10-CM | POA: Diagnosis not present

## 2023-07-08 DIAGNOSIS — Z13220 Encounter for screening for lipoid disorders: Secondary | ICD-10-CM | POA: Diagnosis not present

## 2023-07-08 DIAGNOSIS — R7309 Other abnormal glucose: Secondary | ICD-10-CM | POA: Diagnosis not present

## 2023-07-08 LAB — POCT URINALYSIS DIPSTICK
Bilirubin, UA: NEGATIVE
Glucose, UA: NEGATIVE
Ketones, UA: NEGATIVE
Nitrite, UA: NEGATIVE
Protein, UA: POSITIVE — AB
Spec Grav, UA: 1.02 (ref 1.010–1.025)
Urobilinogen, UA: 0.2 U/dL
pH, UA: 5 (ref 5.0–8.0)

## 2023-07-08 NOTE — Progress Notes (Signed)
   Acute Office Visit  Subjective:     Patient ID: Victoria [name], female    DOB: 04-16-1961, 63 y.o.   MRN: [id number]  Chief Complaint  Patient presents with   Dysuria    Pain when urinating, burning for 3 months    HPI Patient is in today for dysuria. First noticed symptoms 8 week ago.   URINARY SYMPTOMS Dysuria: burning Urinary frequency: yes Urgency: yes Small volume voids: yes Urinary incontinence: yes Foul odor: yes Hematuria: no Suprapubic pain/pressure: yes Flank pain: yes, bilaterally but mostly left Fever:  low grade Vomiting: yes Relief with pyridium: no Recurrent urinary tract infection: no Vaginal discharge: no Treatments attempted: Monostat weeks ago, currently taking Azo, Z-pack finished 3 days ago    Review of Systems  Constitutional:  Negative for chills and fever.  Gastrointestinal:  Positive for nausea and vomiting. Negative for abdominal pain.  Genitourinary:  Positive for dysuria, flank pain, frequency and urgency. Negative for hematuria.        Objective:    BP 130/80 (Cuff Size: Large)   Pulse 88   Temp 97.9 F (36.6 C) (Oral)   Resp 16   Ht 5' 5 (1.651 m)   Wt 211 lb 6.4 oz (95.9 kg)   SpO2 95%   BMI 35.18 kg/m  BP Readings from Last 3 Encounters:  07/08/23 130/80  06/29/23 116/72  05/05/23 132/82   Wt Readings from Last 3 Encounters:  07/08/23 211 lb 6.4 oz (95.9 kg)  06/29/23 213 lb 11.2 oz (96.9 kg)  05/05/23 213 lb (96.6 kg)      Physical Exam Constitutional:      Appearance: Normal appearance.  HENT:     Head: Normocephalic and atraumatic.  Eyes:     Conjunctiva/sclera: Conjunctivae normal.  Cardiovascular:     Rate and Rhythm: Normal rate and regular rhythm.  Pulmonary:     Effort: Pulmonary effort is normal.     Breath sounds: Normal breath sounds.  Abdominal:     General: There is no distension.     Palpations: There is no mass.     Tenderness: There is no abdominal tenderness. There is no right CVA  tenderness, left CVA tenderness or guarding.     Hernia: No hernia is present.  Skin:    General: Skin is warm and dry.  Neurological:     General: No focal deficit present.     Mental Status: She is alert. Mental status is at baseline.  Psychiatric:        Mood and Affect: Mood normal.        Behavior: Behavior normal.     No results found for any visits on 07/08/23.      Assessment & Plan:   1. Dysuria (Primary)/Acute left flank pain: UA here with small amount of blood, 2+ leukocytes, negative for nitrates but she just finished antibiotic. Will do vaginal swab and send for culture, however due to ongoing flank pain will order renal CT. Continue Azo, allergies to penicillins and sulfa, no issues with Macrobid or Cipro .  - POCT urinalysis dipstick - Cervicovaginal ancillary only - CT RENAL STONE STUDY; Future - Urine Culture  Return for already scheduled.  Sharyle Fischer, DO

## 2023-07-09 LAB — URINE CULTURE
MICRO NUMBER:: 16057298
Result:: NO GROWTH
SPECIMEN QUALITY:: ADEQUATE

## 2023-07-11 ENCOUNTER — Telehealth: Payer: Self-pay

## 2023-07-11 LAB — CERVICOVAGINAL ANCILLARY ONLY
Bacterial Vaginitis (gardnerella): NEGATIVE
Candida Glabrata: NEGATIVE
Candida Vaginitis: NEGATIVE
Chlamydia: NEGATIVE
Comment: NEGATIVE
Comment: NEGATIVE
Comment: NEGATIVE
Comment: NEGATIVE
Comment: NEGATIVE
Comment: NORMAL
Neisseria Gonorrhea: NEGATIVE
Trichomonas: NEGATIVE

## 2023-07-11 NOTE — Telephone Encounter (Signed)
 Pt's husband was calling, they seen results on Mychart and are needing liver function tests faxed to [facility] back and neck pain, fax [phone] for management of her muscle relaxers and pain medication. Advised I would send message back to provider

## 2023-07-12 LAB — ANA: Anti Nuclear Antibody (ANA): NEGATIVE

## 2023-07-12 LAB — COMPLETE METABOLIC PANEL WITH GFR
AG Ratio: 1.2 (calc) (ref 1.0–2.5)
ALT: 85 U/L — ABNORMAL HIGH (ref 6–29)
AST: 92 U/L — ABNORMAL HIGH (ref 10–35)
Albumin: 4.1 g/dL (ref 3.6–5.1)
Alkaline phosphatase (APISO): 129 U/L (ref 37–153)
BUN/Creatinine Ratio: 10 (calc) (ref 6–22)
BUN: 11 mg/dL (ref 7–25)
CO2: 27 mmol/L (ref 20–32)
Calcium: 9.4 mg/dL (ref 8.6–10.4)
Chloride: 102 mmol/L (ref 98–110)
Creat: 1.08 mg/dL — ABNORMAL HIGH (ref 0.50–1.05)
Globulin: 3.5 g/dL (ref 1.9–3.7)
Glucose, Bld: 125 mg/dL — ABNORMAL HIGH (ref 65–99)
Potassium: 4.1 mmol/L (ref 3.5–5.3)
Sodium: 140 mmol/L (ref 135–146)
Total Bilirubin: 0.5 mg/dL (ref 0.2–1.2)
Total Protein: 7.6 g/dL (ref 6.1–8.1)
eGFR: 58 mL/min/{1.73_m2} — ABNORMAL LOW (ref >=60)

## 2023-07-12 LAB — LIPID PANEL
Cholesterol: 278 mg/dL — ABNORMAL HIGH (ref <200)
HDL: 49 mg/dL — ABNORMAL LOW (ref >=50)
LDL Cholesterol (Calc): 190 mg/dL — ABNORMAL HIGH
Non-HDL Cholesterol (Calc): 229 mg/dL — ABNORMAL HIGH (ref <130)
Total CHOL/HDL Ratio: 5.7 (calc) — ABNORMAL HIGH (ref <5.0)
Triglycerides: 209 mg/dL — ABNORMAL HIGH (ref <150)

## 2023-07-12 LAB — ANTI-DNA ANTIBODY, DOUBLE-STRANDED: ds DNA Ab: <1 [IU]/mL

## 2023-07-12 LAB — COMPLEMENT, TOTAL: Compl, Total (CH50): >60 U/mL — ABNORMAL HIGH (ref 31–60)

## 2023-07-12 LAB — HEMOGLOBIN A1C
Hgb A1c MFr Bld: 6.6 %{Hb} — ABNORMAL HIGH (ref <5.7)
Mean Plasma Glucose: 143 mg/dL
eAG (mmol/L): 7.9 mmol/L

## 2023-07-12 NOTE — Telephone Encounter (Signed)
[facility] should be able to see records from [facility]

## 2023-07-18 ENCOUNTER — Ambulatory Visit: Payer: Medicare HMO

## 2023-07-26 ENCOUNTER — Ambulatory Visit
Admission: RE | Admit: 2023-07-26 | Discharge: 2023-07-26 | Disposition: A | Discharge status: Home / Self Care | Payer: Medicare HMO | Source: Ambulatory Visit | Attending: Internal Medicine | Admitting: Internal Medicine

## 2023-07-26 DIAGNOSIS — K5730 Diverticulosis of large intestine without perforation or abscess without bleeding: Secondary | ICD-10-CM | POA: Diagnosis not present

## 2023-07-26 DIAGNOSIS — K8689 Other specified diseases of pancreas: Secondary | ICD-10-CM | POA: Diagnosis not present

## 2023-07-26 DIAGNOSIS — K5900 Constipation, unspecified: Secondary | ICD-10-CM | POA: Diagnosis not present

## 2023-07-26 DIAGNOSIS — R109 Unspecified abdominal pain: Secondary | ICD-10-CM | POA: Diagnosis not present

## 2023-07-26 DIAGNOSIS — R300 Dysuria: Secondary | ICD-10-CM | POA: Insufficient documentation

## 2023-08-01 IMAGING — MR MR THORACIC SPINE W/O CM
5 of 7 series · 25 of 48 positions shown · non-contrast
Comparison: None Available.

CLINICAL DATA: Initial evaluation for central with right greater
than left neck, shoulder, and arm pain, right greater than left hand
numbness.

EXAM:
MRI CERVICAL, THORACIC AND LUMBAR SPINE WITHOUT CONTRAST
TECHNIQUE: Multiplanar and multiecho pulse sequences of the cervical spine, to
include the craniocervical junction and cervicothoracic junction,
and thoracic and lumbar spine, were obtained without intravenous
contrast.

[Series 25: T1 · sagittal · 5.0mm · 1.88mm/px · 1 of 9 slices shown (1 of 2)]
[im 1/9]
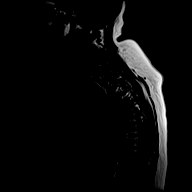

[Series 26: T2 · sagittal · 3.0mm · 1.06mm/px · 4 of 17 slices shown (1 of 2)]
[im 1/17]
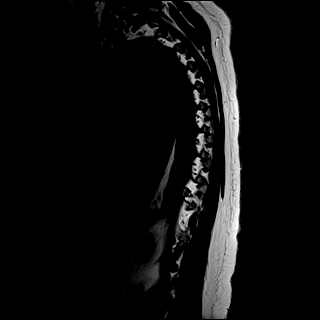
[im 6/17]
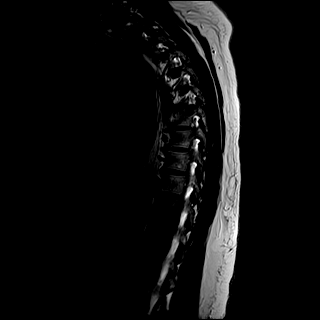
[im 11/17]
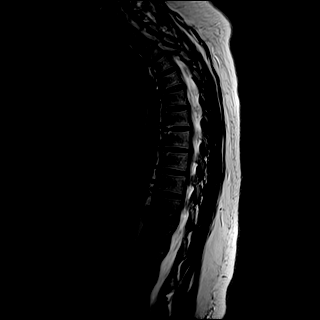
[im 17/17]
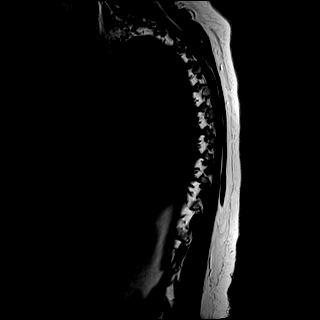

[Series 27: T1 · sagittal · 3.0mm · 1.06mm/px · 5 of 17 slices shown (2 of 2)]
[im 1/17]
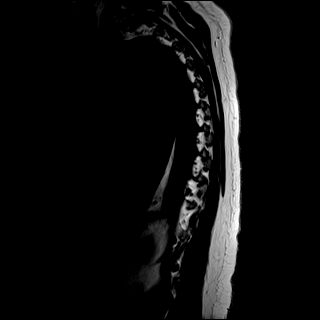
[im 5/17]
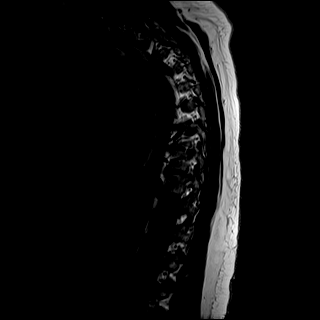
[im 9/17]
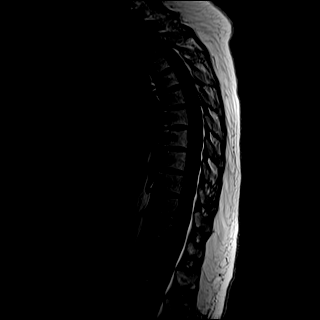
[im 13/17]
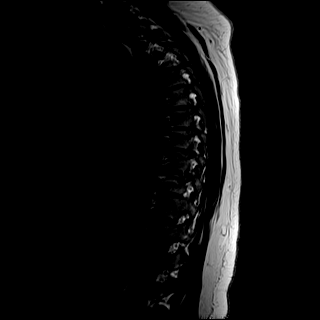
[im 17/17]
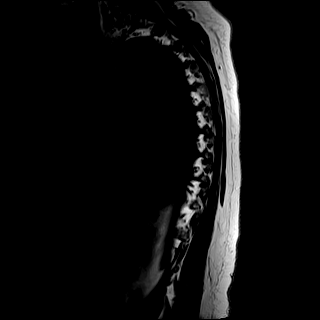

[Series 28: STIR · sagittal · 3.0mm · 0.53mm/px · 4 of 17 slices shown]
[im 1/17]
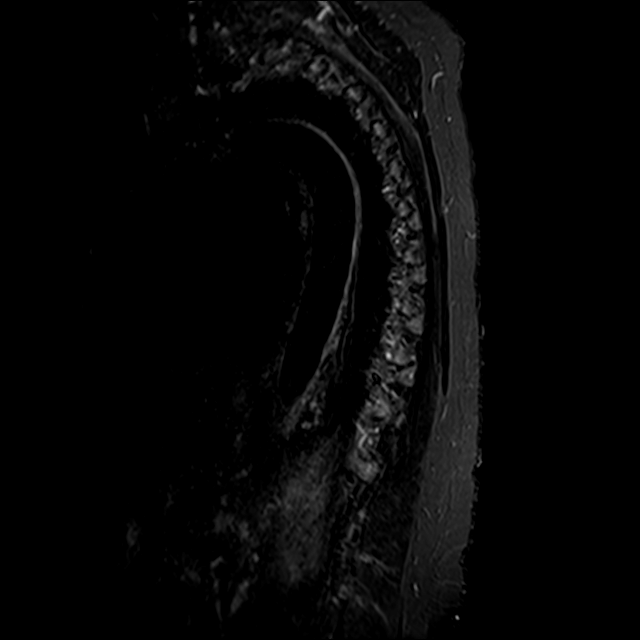
[im 5/17]
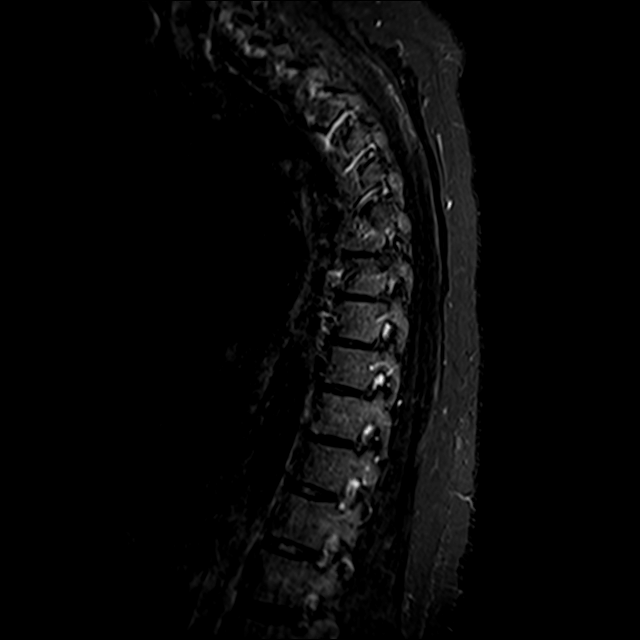
[im 9/17]
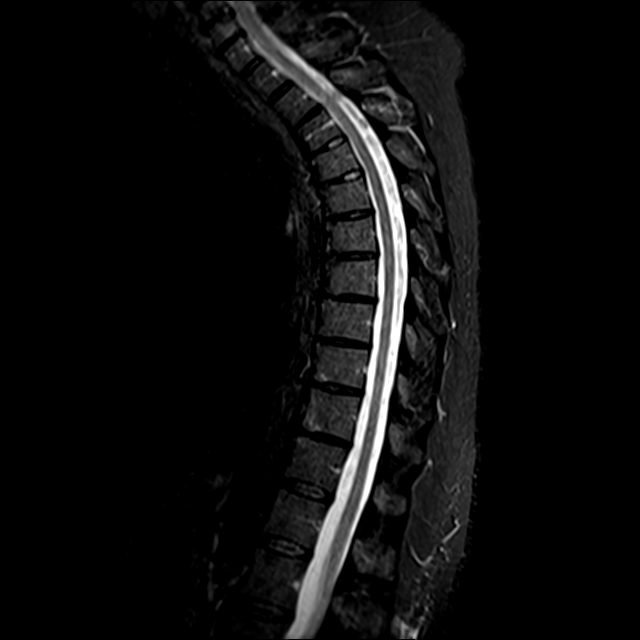
[im 13/17]
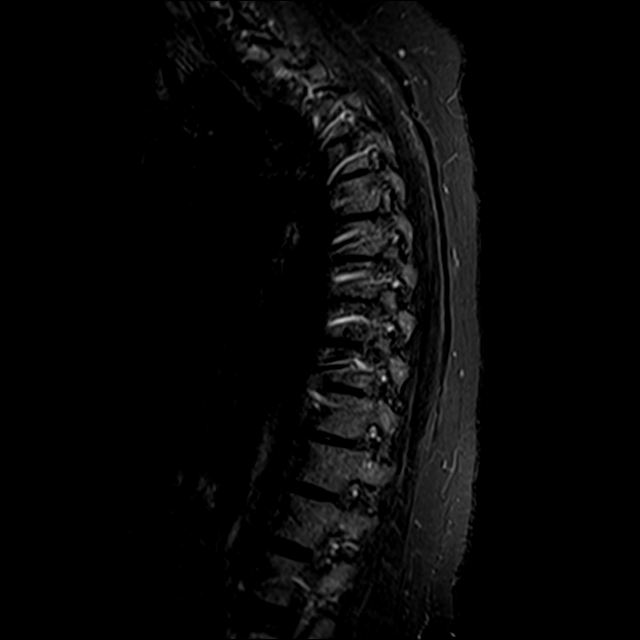

[Series 29: T2 · axial · 4.0mm · 0.59mm/px · z∈[-308,-127]mm · 11 of 39 slices shown (2 of 2)]
[im 1/39]
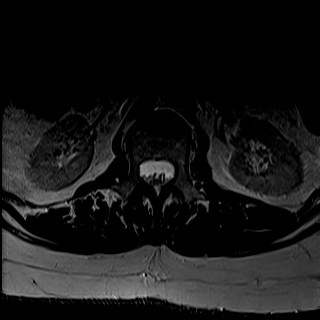
[im 4/39]
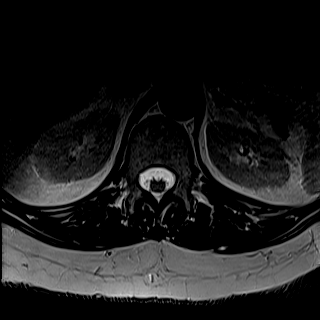
[im 8/39]
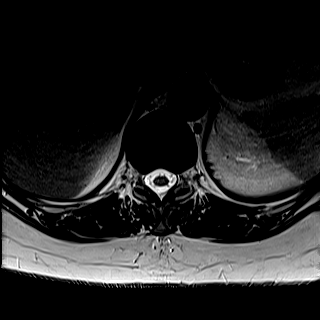
[im 12/39]
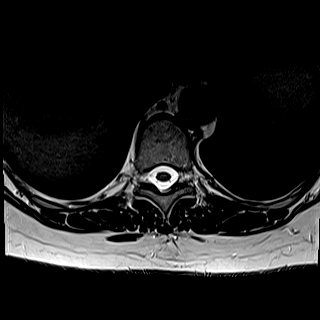
[im 16/39]
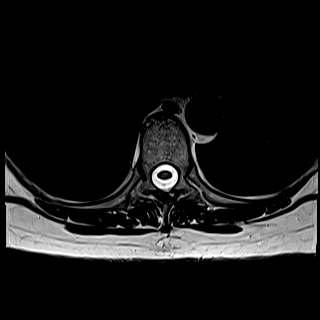
[im 20/39]
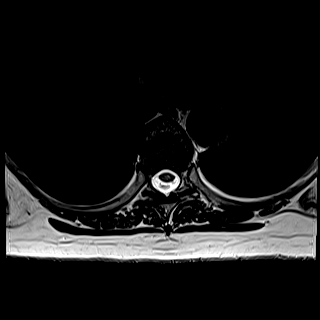
[im 23/39]
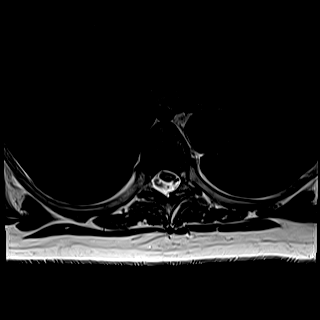
[im 27/39]
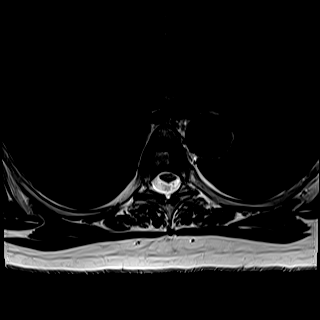
[im 31/39]
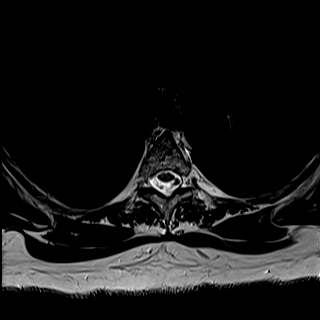
[im 35/39]
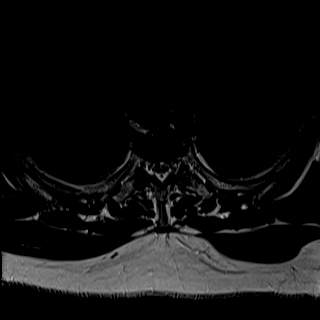
[im 39/39]
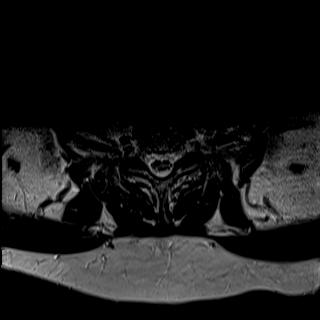

[25 of 48 positions shown; findings below may reference images not displayed]

FINDINGS: MRI CERVICAL SPINE FINDINGS

Alignment: Examination mildly degraded by motion artifact.

Straightening with mild reversal of the normal cervical lordosis. No
listhesis.

Vertebrae: Vertebral body height maintained without acute or chronic
fracture. Bone marrow signal intensity within normal limits. No
discrete or worrisome osseous lesions. No abnormal marrow edema.

Cord: Normal signal and morphology.

Posterior Fossa, vertebral arteries, paraspinal tissues: Visualized
brain and posterior fossa within normal limits. Craniocervical
junction normal. Paraspinous and prevertebral soft tissues within
normal limits. Normal intravascular flow voids seen within the
vertebral arteries bilaterally.

Disc levels:

C2-C3: Unremarkable.

C3-C4: Minimal disc bulge with right-sided uncovertebral spurring.
No significant spinal stenosis. Mild right C4 foraminal narrowing.
Left neural foramen is patent.

C4-C5: Degenerative intervertebral disc space narrowing with diffuse
disc bulge. Associated endplate and uncovertebral spurring.
Flattening and partial effacement of the ventral thecal sac. Thecal
sac remains fairly capacious without significant spinal stenosis.
Mild right greater than left C5 foraminal stenosis.

C5-C6: Degenerative intervertebral disc space narrowing with diffuse
disc osteophyte complex. Broad posterior component flattens and
partially effaces the ventral thecal sac, slightly asymmetric to the
right. Mild spinal stenosis. Moderate to severe bilateral C6
foraminal narrowing.

C6-C7: Degenerative intervertebral disc space narrowing with diffuse
disc osteophyte complex. Flattening of the ventral thecal sac
without significant spinal stenosis. Left greater than right
uncovertebral spurring with resultant moderate left and mild right
C7 foraminal stenosis.

C7-T1: Negative interspace. Left greater than right facet
hypertrophy. No stenosis.

MRI THORACIC SPINE FINDINGS

Alignment:  Examination somewhat degraded by motion artifact.

Exaggeration of the normal upper thoracic kyphosis.  No listhesis.

Vertebrae: Vertebral body height maintained without acute or chronic
fracture. Bone marrow signal intensity within normal limits.
Subcentimeter benign hemangioma noted within the T8 vertebral body.
No worrisome osseous lesions. Mild discogenic reactive endplate
change noted about the T7-8, T9-10 and T10-11 interspaces
anteriorly. No other abnormal marrow edema.

Cord:  Normal signal and morphology.

Paraspinal and other soft tissues: Unremarkable.

Disc levels:

Minimal for age noncompressive disc bulging noted at T6-7 through
T10-11. Mild reactive endplate change with marrow edema about the
T7-8, T9-10 and T10-11 interspace anteriorly. Single small right
paracentral disc herniation present at T7-8 (series 29, image 23).
No other focal disc herniation. No significant facet pathology. No
spinal stenosis or cord impingement. Foramina remain widely patent.

MRI LUMBAR SPINE FINDINGS

Segmentation:  Examination degraded by motion artifact.

Standard segmentation. Lowest well-formed disc space labeled the
L5-S1 level.

Alignment: 6 mm anterolisthesis of L4 on L5, chronic and facet
mediated. Alignment otherwise normal with preservation of the normal
lumbar lordosis.

Vertebrae: Vertebral body height maintained without acute or chronic
fracture. Bone marrow signal intensity within normal limits. No
discrete or worrisome osseous lesions. Reactive marrow edema present
about the L4-5 facets bilaterally due to facet arthritis. No other
abnormal marrow edema.

Conus medullaris and cauda equina: Conus extends to the L1 level.
Conus and cauda equina appear normal.

Paraspinal and other soft tissues: Unremarkable.

Disc levels:

L1-2:  Unremarkable.

L2-3: Mild annular disc bulge. No significant spinal stenosis.
Foramina remain patent.

L3-4: Mild annular disc bulge. Mild facet hypertrophy. No
significant spinal stenosis. Foramina remain patent.

L4-5: 6 mm anterolisthesis. Associated degenerative intervertebral
disc space narrowing with disc desiccation and broad posterior
pseudo disc bulge/uncovering. Moderate facet and ligament flavum
hypertrophy with associated trace joint effusions and reactive
marrow edema. Resultant severe spinal stenosis with mild bilateral
L4 foraminal narrowing, slightly worse on the right.

L5-S1: Negative interspace. Mild to moderate facet hypertrophy. No
stenosis.
IMPRESSION: MRI CERVICAL SPINE IMPRESSION:

1. Mild-to-moderate degenerative spondylosis at C4-5 through C6-7
with resultant mild spinal stenosis at C5-6. No frank cord
impingement.
2. Multifactorial degenerative changes with resultant multilevel
foraminal narrowing as above. Notable findings include moderate to
severe bilateral C6 foraminal stenosis with moderate left C7
foraminal narrowing.

MRI THORACIC SPINE IMPRESSION:

1. Mild for age spondylosis at T6-7 through T10-11, with single
small right paracentral disc protrusion at T7-8. No significant
stenosis or neural impingement.
2. Mild discogenic reactive endplate change with marrow edema about
the T7-8, T9-10 and T10-11 interspaces. Findings could contribute to
back pain.

MRI LUMBAR SPINE IMPRESSION:

1. 6 mm facet mediated anterolisthesis at L4-5 with resultant severe
spinal stenosis, with mild bilateral L4 foraminal narrowing.
2. Mild to moderate facet hypertrophy at L5-S1 without stenosis.

## 2023-08-04 DIAGNOSIS — R1110 Vomiting, unspecified: Secondary | ICD-10-CM | POA: Diagnosis not present

## 2023-08-04 DIAGNOSIS — M1991 Primary osteoarthritis, unspecified site: Secondary | ICD-10-CM | POA: Diagnosis not present

## 2023-08-04 DIAGNOSIS — R52 Pain, unspecified: Secondary | ICD-10-CM | POA: Diagnosis not present

## 2023-08-04 DIAGNOSIS — N3000 Acute cystitis without hematuria: Secondary | ICD-10-CM | POA: Diagnosis not present

## 2023-08-04 DIAGNOSIS — R109 Unspecified abdominal pain: Secondary | ICD-10-CM | POA: Diagnosis not present

## 2023-08-04 DIAGNOSIS — M545 Low back pain, unspecified: Secondary | ICD-10-CM | POA: Diagnosis not present

## 2023-08-05 ENCOUNTER — Encounter: Payer: Self-pay | Admitting: Internal Medicine

## 2023-08-17 ENCOUNTER — Other Ambulatory Visit: Payer: Self-pay | Admitting: Internal Medicine

## 2023-08-18 NOTE — Telephone Encounter (Signed)
 Requested Prescriptions  Pending Prescriptions Disp Refills   pantoprazole (PROTONIX) 40 MG tablet [Pharmacy Med Name: PANTOPRAZOLE SOD DR 40 MG TAB] 90 tablet 0    Sig: TAKE 1 TABLET BY MOUTH EVERY DAY IN THE MORNING     Gastroenterology: Proton Pump Inhibitors Passed - 08/18/2023  9:50 AM      Passed - Valid encounter within last 12 months    Recent Outpatient Visits           1 month ago Fibrosis of lung [facility])   [place] Alba Cory, MD   4 months ago Need for influenza vaccination   [facility] Margarita Mail, DO   7 months ago Mild persistent asthma with acute exacerbation   [facility] Margarita Mail, DO   8 months ago Shortness of breath   [facility] Danelle Berry, PA-C   1 year ago 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

## 2023-08-24 DIAGNOSIS — M5416 Radiculopathy, lumbar region: Secondary | ICD-10-CM | POA: Diagnosis not present

## 2023-08-25 ENCOUNTER — Ambulatory Visit: Payer: Medicare HMO

## 2023-08-25 DIAGNOSIS — Z0000 Encounter for general adult medical examination without abnormal findings: Secondary | ICD-10-CM

## 2023-08-25 NOTE — Progress Notes (Signed)
 Subjective:   Victoria [name] is a 63 y.o. who presents for a Medicare Wellness preventive visit.  Visit Complete: Virtual I connected with  Katheren Shams on 08/25/23 by a audio enabled telemedicine application and verified that I am speaking with the correct person using two identifiers.  Patient Location: Home  Provider Location: Office/Clinic  I discussed the limitations of evaluation and management by telemedicine. The patient expressed understanding and agreed to proceed.  Vital Signs: Because this visit was a virtual/telehealth visit, some criteria may be missing or patient reported. Any vitals not documented were not able to be obtained and vitals that have been documented are patient reported.  VideoDeclined- This patient declined Librarian, academic. Therefore the visit was completed with audio only.  Persons Participating in Visit: Patient.  AWV Questionnaire: No: Patient Medicare AWV questionnaire was not completed prior to this visit.  Cardiac Risk Factors include: advanced age (>42men, >70 women);dyslipidemia;obesity (BMI >30kg/m2)     Objective:    Today's Vitals   08/25/23 1352  PainSc: 4    There is no height or weight on file to calculate BMI.     08/25/2023    1:58 PM 09/20/2022   10:09 AM 08/19/2022    1:51 PM 11/19/2019    1:10 PM 11/14/2019   11:45 PM 11/14/2019   12:53 PM 07/07/2015   11:43 PM  Advanced Directives  Does Patient Have a Medical Advance Directive? No No No   No   Would patient like information on creating a medical advance directive? No - Patient declined           Information is confidential and restricted. Go to Review Flowsheets to unlock data.    Current Medications (verified) Outpatient Encounter Medications as of 08/25/2023  Medication Sig   albuterol (VENTOLIN HFA) 108 (90 Base) MCG/ACT inhaler Inhale 1-2 puffs into the lungs every 6 (six) hours as needed for wheezing or shortness of breath.    benzonatate (TESSALON) 100 MG capsule Take 1-2 capsules (100-200 mg total) by mouth 3 (three) times daily as needed for cough.   cholecalciferol (VITAMIN D) 25 MCG tablet Take 1 tablet (1,000 Units total) by mouth daily.   fluticasone (FLONASE) 50 MCG/ACT nasal spray SPRAY 1 SPRAY INTO EACH NOSTRIL EVERY DAY AS NEEDED   Fluticasone-Umeclidin-Vilant (TRELEGY ELLIPTA) 200-62.5-25 MCG/ACT AEPB Inhale 1 puff into the lungs daily at 12 noon.   gabapentin (NEURONTIN) 600 MG tablet Take by mouth.   hydrocortisone 2.5 % cream APPLY TO AFFECTED AREA TWICE A DAY   ipratropium-albuterol (DUONEB) 0.5-2.5 (3) MG/3ML SOLN Take 3 mLs by nebulization 3 (three) times daily as needed (cough wheeze SOB bronchitis/asthma symptoms).   ketoconazole (NIZORAL) 2 % cream Apply 1 Application topically daily.   MOVANTIK 25 MG TABS tablet Take 25 mg by mouth daily.   ondansetron (ZOFRAN) 8 MG tablet Take 1 tablet (8 mg total) by mouth every 8 (eight) hours as needed for nausea or vomiting.   Oxycodone HCl 10 MG TABS Take 10 mg by mouth every 6 (six) hours as needed.   pantoprazole (PROTONIX) 40 MG tablet TAKE 1 TABLET BY MOUTH EVERY DAY IN THE MORNING   polyvinyl alcohol (LIQUIFILM TEARS) 1.4 % ophthalmic solution Place 1 drop into both eyes 3 (three) times daily.   Respiratory Therapy Supplies (NEBULIZER/TUBING/MOUTHPIECE) KIT Disp one nebulizer machine, tubing set and mouthpiece kit   conjugated estrogens (PREMARIN) vaginal cream Premarin 0.625 mg/gram vaginal cream  INSERT INTO THE VAGINA EVERY OTHER DAY. (  Patient not taking: Reported on 08/25/2023)   DULoxetine (CYMBALTA) 30 MG capsule Take 30 mg by mouth 2 (two) times daily.   predniSONE (STERAPRED UNI-PAK 21 TAB) 10 MG (21) TBPK tablet Use as directed. (Patient not taking: Reported on 08/25/2023)   No facility-administered encounter medications on file as of 08/25/2023.    Allergies (verified) Acetaminophen, Atorvastatin, Sertraline, Amoxicillin, Aspirin, Cefuroxime,  Codeine, Hydrocodone, Penicillins, and Sulfa antibiotics   History: Past Medical History:  Diagnosis Date   Asthma    Cervical spinal stenosis    Fatty liver    GERD (gastroesophageal reflux disease)    Hypercholesterolemia    Hypertension    Lumbar stenosis    Lyme disease    Melanocarcinoma (HCC)    Osteoarthritis of both hips    Psychotic disorder (HCC)    Pulmonary fibrosis (HCC)    Sleep apnea    Past Surgical History:  Procedure Laterality Date   ABDOMINAL HYSTERECTOMY     APPENDECTOMY     BREAST EXCISIONAL BIOPSY Right 1997   neg   ESOPHAGOGASTRODUODENOSCOPY (EGD) WITH PROPOFOL N/A 09/20/2022   Procedure: ESOPHAGOGASTRODUODENOSCOPY (EGD) WITH PROPOFOL;  Surgeon: Wyline Mood, MD;  Location[facility] ENDOSCOPY;  Service: Gastroenterology;  Laterality: N/A;   HIATAL HERNIA REPAIR     Jan 2017   LAPAROSCOPY     Family History  Problem Relation Age of Onset   Alcohol abuse Mother    Arthritis Mother    Heart disease Mother    Liver disease Mother    Osteoporosis Mother    Clotting disorder Mother    Diabetes Father    Arthritis Father    Stroke Father    Sarcoidosis Father    Heart failure Father    Bipolar disorder Sister    Schizophrenia Sister    Depression Sister    Anxiety disorder Sister    Alcohol abuse Sister    Diabetes Sister    Arthritis Sister    Thyroid disease Sister    Lupus Sister    Depression Sister    Anxiety disorder Sister    Alcohol abuse Sister    Depression Sister    Anxiety disorder Sister    Ehlers-Danlos syndrome Daughter    Social History   Socioeconomic History   Marital status: Married    Spouse name: bruce   Number of children: 1   Years of education: Not on file   Highest education level: Associate degree: academic program  Occupational History   Occupation: home maker  Tobacco Use   Smoking status: Former    Current packs/day: 0.00    Types: Cigarettes    Quit date: 1987    Years since quitting: 38.2   Smokeless  tobacco: Never  Vaping Use   Vaping status: Never Used  Substance and Sexual Activity   Alcohol use: No    Alcohol/week: 0.0 standard drinks of alcohol   Drug use: No    Comment: No use of illict drugs.   Sexual activity: Not Currently  Other Topics Concern   Not on file  Social History Narrative   Lives with husband at home.   Social Drivers of Corporate investment banker Strain: Low Risk  (08/25/2023)   Overall Financial Resource Strain (CARDIA)    Difficulty of Paying Living Expenses: Not hard at all  Food Insecurity: No Food Insecurity (08/25/2023)   Hunger Vital Sign    Worried About Running Out of Food in the Last Year: Never true    Ran  Out of Food in the Last Year: Never true  Transportation Needs: No Transportation Needs (08/25/2023)   PRAPARE - Administrator, Civil Service (Medical): No    Lack of Transportation (Non-Medical): No  Physical Activity: Insufficiently Active (08/25/2023)   Exercise Vital Sign    Days of Exercise per Week: 7 days    Minutes of Exercise per Session: 20 min  Stress: No Stress Concern Present (08/25/2023)   [employer] of Occupational Health - Occupational Stress Questionnaire    Feeling of Stress : Only a little  Social Connections: Moderately Isolated (08/25/2023)   Social Connection and Isolation Panel [NHANES]    Frequency of Communication with Friends and Family: Once a week    Frequency of Social Gatherings with Friends and Family: Never    Attends Religious Services: 1 to 4 times per year    Active Member of [employer] or Organizations: No    Attends Engineer, structural: Never    Marital Status: Married    Tobacco Counseling Counseling given: Not Answered    Clinical Intake:  Pre-visit preparation completed: Yes  Pain : 0-10 Pain Score: 4  Pain Type: Chronic pain Pain Location: Back Pain Orientation: Lower Pain Descriptors / Indicators: Aching, Constant Pain Onset: More than a month ago Pain  Frequency: Constant Pain Relieving Factors: medication  Pain Relieving Factors: medication  BMI - recorded: 35.1 Nutritional Status: BMI > 30  Obese Nutritional Risks: None Diabetes: No  Lab Results  Component Value Date   HGBA1C 6.6 (H) 07/08/2023   HGBA1C 5.5 11/14/2019   HGBA1C 4.8 07/06/2015     How often do you need to have someone help you when you read instructions, pamphlets, or other written materials from your doctor or pharmacy?: 1 - Never  Interpreter Needed?: No  Information entered by :: Kennedy Bucker, LPN   Activities of Daily Living     08/25/2023    1:59 PM 04/18/2023    3:41 PM  In your present state of health, do you have any difficulty performing the following activities:  Hearing? 0 0  Vision? 0 1  Difficulty concentrating or making decisions? 0 1  Walking or climbing stairs? 1 1  Comment BACK PAIN   Dressing or bathing? 0 1  Doing errands, shopping? 0 1  Preparing Food and eating ? N   Using the Toilet? N   In the past six months, have you accidently leaked urine? N   Do you have problems with loss of bowel control? N   Managing your Medications? N   Managing your Finances? N   Housekeeping or managing your Housekeeping? N     Patient Care Team: Margarita Mail, DO as PCP - General (Internal Medicine) [place], [facility])  Indicate any recent Medical Services you may have received from other than [facility] providers in the past year (date may be approximate).     Assessment:   This is a routine wellness examination for Victoria [name].  Hearing/Vision screen Hearing Screening - Comments:: NO AIDS Vision Screening - Comments:: READERS- [facility]   Goals Addressed             This Visit's Progress    DIET - EAT MORE FRUITS AND VEGETABLES         Depression Screen     08/25/2023    1:57 PM 06/29/2023    3:40 PM 04/18/2023    3:41 PM 12/30/2022    2:22 PM 12/08/2022  3:37 PM 08/19/2022    1:48 PM 07/22/2022    2:00 PM   PHQ 2/9 Scores  PHQ - 2 Score 1 0 0 3 4 2    PHQ- 9 Score 1 0 0 6 4 12       Information is confidential and restricted. Go to Review Flowsheets to unlock data.    Fall Risk     08/25/2023    1:59 PM 07/08/2023   10:41 AM 06/29/2023    3:40 PM 04/18/2023    3:40 PM 12/30/2022    2:20 PM  Fall Risk   Falls in the past year? 0 0 0 0 1  Number falls in past yr: 0 0 0 0 0  Injury with Fall? 0 0 0 0 0  Risk for fall due to : No Fall Risks No Fall Risks No Fall Risks    Follow up Falls prevention discussed;Falls evaluation completed Falls evaluation completed Falls prevention discussed;Education provided;Falls evaluation completed      MEDICARE RISK AT HOME:  Medicare Risk at Home Any stairs in or around the home?: Yes If so, are there any without handrails?: No Home free of loose throw rugs in walkways, pet beds, electrical cords, etc?: Yes Adequate lighting in your home to reduce risk of falls?: Yes Life alert?: No Use of a cane, walker or w/c?: Yes (CANE SOMETIMES) Grab bars in the bathroom?: Yes Shower chair or bench in shower?: No Elevated toilet seat or a handicapped toilet?: No  TIMED UP AND GO:  Was the test performed?  No  Cognitive Function: 6CIT completed        08/25/2023    2:01 PM 08/19/2022    3:07 PM  6CIT Screen  What Year? 0 points 0 points  What month? 0 points 0 points  What time? 0 points 0 points  Count back from 20 0 points 0 points  Months in reverse 0 points 0 points  Repeat phrase 0 points 0 points  Total Score 0 points 0 points    Immunizations Immunization History  Administered Date(s) Administered   Hep A / Hep B 02/10/2023   Influenza Inj Mdck Quad Pf 06/01/2016   Influenza, Seasonal, Injecte, Preservative Fre 04/18/2023   Influenza,inj,Quad PF,6+ Mos 06/01/2016, 03/15/2017, 04/18/2018   Influenza,inj,quad, With Preservative 06/01/2016, 03/15/2017, 04/18/2018   Influenza-Unspecified 04/14/1995, 02/07/2020   PFIZER(Purple Top)SARS-COV-2  Vaccination 08/21/2019, 09/14/2019   Pfizer Covid-19 Vaccine Bivalent Booster 69yrs & up 02/18/2021   Pneumococcal Conjugate-13 01/15/2021   Pneumococcal Polysaccharide-23 06/01/2015   Td 12/10/2002   Tdap 09/27/2014    Screening Tests Health Maintenance  Topic Date Due   Zoster Vaccines- Shingrix (1 of 2) Never done   Pneumococcal Vaccine 60-58 Years old (3 of 3 - PPSV23 or PCV20) 03/12/2021   COVID-19 Vaccine (4 - 2024-25 season) 01/30/2023   MAMMOGRAM  04/21/2024   Medicare Annual Wellness (AWV)  08/24/2024   DTaP/Tdap/Td (3 - Td or Tdap) 09/26/2024   Colonoscopy  01/19/2026   INFLUENZA VACCINE  Completed   Hepatitis C Screening  Completed   HIV Screening  Completed   HPV VACCINES  Aged Out    Health Maintenance  Health Maintenance Due  Topic Date Due   Zoster Vaccines- Shingrix (1 of 2) Never done   Pneumococcal Vaccine 81-80 Years old (3 of 3 - PPSV23 or PCV20) 03/12/2021   COVID-19 Vaccine (4 - 2024-25 season) 01/30/2023   Health Maintenance Items Addressed: MAMMOGRAM DUE NEXT YEAR, COLONOSCOPY UP TO DATE  Additional Screening:  Vision Screening: Recommended annual ophthalmology exams for early detection of glaucoma and other disorders of the eye.  Dental Screening: Recommended annual dental exams for proper oral hygiene  Community Resource Referral / Chronic Care Management: CRR required this visit?  No   CCM required this visit?  No     Plan:     I have personally reviewed and noted the following in the patient's chart:   Medical and social history Use of alcohol, tobacco or illicit drugs  Current medications and supplements including opioid prescriptions. Patient is currently taking opioid prescriptions. Information provided to patient regarding non-opioid alternatives. Patient advised to discuss non-opioid treatment plan with their provider. Functional ability and status Nutritional status Physical activity Advanced directives List of other  physicians Hospitalizations, surgeries, and ER visits in previous 12 months Vitals Screenings to include cognitive, depression, and falls Referrals and appointments  In addition, I have reviewed and discussed with patient certain preventive protocols, quality metrics, and best practice recommendations. A written personalized care plan for preventive services as well as general preventive health recommendations were provided to patient.     Hal Hope, LPN   [id number]   After Visit Summary: (MyChart) Due to this being a telephonic visit, the after visit summary with patients personalized plan was offered to patient via MyChart   Notes: Nothing significant to report at this time.

## 2023-08-25 NOTE — Patient Instructions (Addendum)
 Ms. Doscher , Thank you for taking time to come for your Medicare Wellness Visit. I appreciate your ongoing commitment to your health goals. Please review the following plan we discussed and let me know if I can assist you in the future.   Referrals/Orders/Follow-Ups/Clinician Recommendations: NONE  This is a list of the screening recommended for you and due dates:  Health Maintenance  Topic Date Due   Zoster (Shingles) Vaccine (1 of 2) Never done   Pneumococcal Vaccination (3 of 3 - PPSV23 or PCV20) 03/12/2021   COVID-19 Vaccine (4 - 2024-25 season) 01/30/2023   Mammogram  04/21/2024   Medicare Annual Wellness Visit  08/24/2024   DTaP/Tdap/Td vaccine (3 - Td or Tdap) 09/26/2024   Colon Cancer Screening  01/19/2026   Flu Shot  Completed   Hepatitis C Screening  Completed   HIV Screening  Completed   HPV Vaccine  Aged Out    Advanced directives: (ACP Link)Information on Advanced Care Planning can be found at [facility] Advance Health Care Directives Advance Health Care Directives. [email or web address]   Next Medicare Annual Wellness Visit scheduled for next year: Yes   08/30/24 @ 3:10 PM BY PHONE

## 2023-08-29 DIAGNOSIS — M5441 Lumbago with sciatica, right side: Secondary | ICD-10-CM | POA: Diagnosis not present

## 2023-08-29 DIAGNOSIS — G8929 Other chronic pain: Secondary | ICD-10-CM | POA: Diagnosis not present

## 2023-08-29 DIAGNOSIS — M5442 Lumbago with sciatica, left side: Secondary | ICD-10-CM | POA: Diagnosis not present

## 2023-08-29 DIAGNOSIS — Z9181 History of falling: Secondary | ICD-10-CM | POA: Diagnosis not present

## 2023-08-29 DIAGNOSIS — Z79891 Long term (current) use of opiate analgesic: Secondary | ICD-10-CM | POA: Diagnosis not present

## 2023-08-29 DIAGNOSIS — M5412 Radiculopathy, cervical region: Secondary | ICD-10-CM | POA: Diagnosis not present

## 2023-08-29 DIAGNOSIS — Z760 Encounter for issue of repeat prescription: Secondary | ICD-10-CM | POA: Diagnosis not present

## 2023-09-14 NOTE — Telephone Encounter (Signed)
 Left message to call back

## 2023-09-14 NOTE — Telephone Encounter (Signed)
 Patient scheduled and patient's husband confirmed appointment.

## 2023-09-15 NOTE — H&P (View-Only) (Signed)
 Referring Physician:  Rockney Cid, DO [address] [place],  [place] [address]  Primary Physician:  Rockney Cid, DO  History of Present Illness: 09/20/2023 [name] is here today with a chief complaint of back and leg pain.  This particularly severe when she stands or walks.  It has been worsening over time.  She has tried multiple different therapies.   History of Present Illness: [name] is a 63 y.o presenting today via telephone visit to review MRI results  06/24/2023 Note from Victoria Karvonen, PA-C    05/05/23 Victoria [name] is a 63 y.o. female who presents with the chief complaint of ongoing lumbar complaints.  She underwent Bilateral S1 transforaminal epidural injections with Dr. Erman Hayward on 11/20/21.  She states that this is injection did not provide her with any relief and she has had worsening low back and bilateral radiating leg pain over the last year and a half.  She is ready to consider surgical interventions.   10/29/21 [name] is a 63 y.o presenting today via telephone visit to review her cervical, thoracic, and lumbar MRIs. She states that she continues to have the same symptoms she is having at her visit in April however they have increased in severity making it difficult for her to ambulate. She also endorses a full body rash with associated pustules. She has not followed up with her primary care provider about this recently.    09/25/2021 [name] is a 63 y.o with a history of SVT, obesity, history of melanoma, endometriosis, convulsions, fibrosis of the lung, depression, IBS, non alcoholic steatohepatitis, pernicious anemia, osteopenia, prediabetes, traumatic brain injury, stage 3 chronic kidney disease, bipolar, dyslipidemia, hypertension, asthma, ADHD, the anxiety, sleep apnea, hiatal hernia, fibromyalgia and GERD who is here today for further evaluation of her lumbar spine. She had a longstanding history of  lumbosacral complaints which has been treated with pain management in the past. She also states that she had a MILD procedure in 2021 in [place] which did not provide her with any substantial relief. She states that she has recent relocated and is looking for further evaluation and treatment. She states that she was previously scheduled for lumbar surgery however it was canceled due to her chronic kidney disease. Today she reports constant low back pain that is worse with any sort of activities including lifting her arms up, sitting up, standing, walking which will result in spasms in her low back. She does also endorse some mid back pain. She has numbness and pain that radiates into her lower abdomen with associated numbness in her anterior thighs and perineum. She does endorse urge type incontinence of both bowel and bladder for about the last 10 years. In addition to the symptoms she reports pain that radiates down the posterior aspects of both of her legs that she describes as sharp and shooting. She also endorses pain that wraps around her abdomen bilaterally and causes a tightness in her upper and lower abdomen with associated mid back pain. In addition to these complaints she describes pain in her neck with a sensation as though her neck bones are popping out of place with pain that radiates down both of her arms worse on the right than the left and into her hands. She also endorses decreased dexterity. Unfortunately she was about 15 minutes late for her appointment today which was only scheduled to discuss her back and we were not able to discuss her cervical spine and more  specific detail due to her lumbar spine being her more pressing concern and time constraints. Overall her symptoms are drastically affecting her quality of life and have decreased her ability to participate in physical activity which is resulted in weight gain. She has seen pain management and has recently had an increase in her  oxycodone  which has provided some mild relief.      Review of Systems:  A 10 point review of systems is negative, except for the pertinent positives and negatives detailed in the HPI.  Past Medical History: Past Medical History:  Diagnosis Date   Asthma    Cervical spinal stenosis    Fatty liver    GERD (gastroesophageal reflux disease)    Hypercholesterolemia    Hypertension    Lumbar stenosis    Lyme disease    Melanocarcinoma (HCC)    Osteoarthritis of both hips    Psychotic disorder (HCC)    Pulmonary fibrosis (HCC)    Sleep apnea     Past Surgical History: Past Surgical History:  Procedure Laterality Date   ABDOMINAL HYSTERECTOMY     APPENDECTOMY     BREAST EXCISIONAL BIOPSY Right 1997   neg   ESOPHAGOGASTRODUODENOSCOPY (EGD) WITH PROPOFOL  N/A 09/20/2022   Procedure: ESOPHAGOGASTRODUODENOSCOPY (EGD) WITH PROPOFOL ;  Surgeon: Luke Salaam, MD;  Location: [facility] ENDOSCOPY;  Service: Gastroenterology;  Laterality: N/A;   HIATAL HERNIA REPAIR     Jan 2017   LAPAROSCOPY      Allergies: Allergies as of 09/20/2023 - Review Complete 09/20/2023  Allergen Reaction Noted   Acetaminophen  Other (See Comments) 08/24/2023   Atorvastatin  09/29/2021   Sertraline Hives and Shortness Of Breath 12/08/2006   Amoxicillin  07/05/2015   Aspirin  07/05/2015   Cefuroxime Hives 12/08/2006   Codeine  07/05/2015   Hydrocodone Hives 09/29/2021   Penicillins  07/05/2015   Sulfa antibiotics  07/05/2015    Medications:  Current Outpatient Medications:    albuterol  (VENTOLIN  HFA) 108 (90 Base) MCG/ACT inhaler, Inhale 1-2 puffs into the lungs every 6 (six) hours as needed for wheezing or shortness of breath., Disp: 18 g, Rfl: 1   baclofen (LIORESAL) 10 MG tablet, Take 20 mg by mouth., Disp: , Rfl:    buprenorphine (BUTRANS) 7.5 MCG/HR, Place 1 patch onto the skin., Disp: , Rfl:    cholecalciferol  (VITAMIN D ) 25 MCG tablet, Take 1 tablet (1,000 Units total) by mouth daily., Disp: 30 tablet,  Rfl: 1   conjugated estrogens (PREMARIN) vaginal cream, , Disp: , Rfl:    fluticasone  (FLONASE ) 50 MCG/ACT nasal spray, SPRAY 1 SPRAY INTO EACH NOSTRIL EVERY DAY AS NEEDED, Disp: , Rfl:    Fluticasone -Umeclidin-Vilant (TRELEGY ELLIPTA ) 200-62.5-25 MCG/ACT AEPB, Inhale 1 puff into the lungs daily at 12 noon., Disp: 60 each, Rfl: 2   gabapentin  (NEURONTIN ) 600 MG tablet, Take by mouth., Disp: , Rfl:    hydrocortisone 2.5 % cream, APPLY TO AFFECTED AREA TWICE A DAY, Disp: , Rfl:    ipratropium-albuterol  (DUONEB) 0.5-2.5 (3) MG/3ML SOLN, Take 3 mLs by nebulization 3 (three) times daily as needed (cough wheeze SOB bronchitis/asthma symptoms)., Disp: 180 mL, Rfl: 1   ketoconazole  (NIZORAL ) 2 % cream, Apply 1 Application topically daily., Disp: 15 g, Rfl: 0   MOVANTIK 25 MG TABS tablet, Take 25 mg by mouth daily., Disp: , Rfl:    ondansetron  (ZOFRAN ) 8 MG tablet, Take 1 tablet (8 mg total) by mouth every 8 (eight) hours as needed for nausea or vomiting., Disp: 20 tablet, Rfl: 0  Oxycodone  HCl 10 MG TABS, Take 10 mg by mouth every 6 (six) hours as needed., Disp: , Rfl:    pantoprazole  (PROTONIX ) 40 MG tablet, TAKE 1 TABLET BY MOUTH EVERY DAY IN THE MORNING, Disp: 90 tablet, Rfl: 0   polyvinyl alcohol  (LIQUIFILM TEARS) 1.4 % ophthalmic solution, Place 1 drop into both eyes 3 (three) times daily., Disp: 15 mL, Rfl: 1   Respiratory Therapy Supplies (NEBULIZER/TUBING/MOUTHPIECE) KIT, Disp one nebulizer machine, tubing set and mouthpiece kit, Disp: 1 kit, Rfl: 0   DULoxetine  (CYMBALTA ) 30 MG capsule, Take 30 mg by mouth 2 (two) times daily., Disp: , Rfl:   Social History: Social History   Tobacco Use   Smoking status: Former    Current packs/day: 0.00    Types: Cigarettes    Quit date: 1987    Years since quitting: 38.3   Smokeless tobacco: Never  Vaping Use   Vaping status: Never Used  Substance Use Topics   Alcohol  use: No    Alcohol /week: 0.0 standard drinks of alcohol    Drug use: No     Comment: No use of illict drugs.    Family Medical History: Family History  Problem Relation Age of Onset   Alcohol  abuse Mother    Arthritis Mother    Heart disease Mother    Liver disease Mother    Osteoporosis Mother    Clotting disorder Mother    Diabetes Father    Arthritis Father    Stroke Father    Sarcoidosis Father    Heart failure Father    Bipolar disorder Sister    Schizophrenia Sister    Depression Sister    Anxiety disorder Sister    Alcohol  abuse Sister    Diabetes Sister    Arthritis Sister    Thyroid  disease Sister    Lupus Sister    Depression Sister    Anxiety disorder Sister    Alcohol  abuse Sister    Depression Sister    Anxiety disorder Sister    Ehlers-Danlos syndrome Daughter     Physical Examination: Vitals:   09/20/23 1309  BP: 110/78    General: Patient is in no apparent distress. Attention to examination is appropriate.  Neck:   Supple.  Full range of motion.  Respiratory: Patient is breathing without any difficulty.   NEUROLOGICAL:     Awake, alert, oriented to person, place, and time.  Speech is clear and fluent.   Cranial Nerves: Pupils equal round and reactive to light.  Facial tone is symmetric.  Facial sensation is symmetric. Shoulder shrug is symmetric. Tongue protrusion is midline.  There is no pronator drift.  Strength: Side Biceps Triceps Deltoid Interossei Grip Wrist Ext. Wrist Flex.  R 5 5 5 5 5 5 5   L 5 5 5 5 5 5 5    Side Iliopsoas Quads Hamstring PF DF EHL  R 5 5 4+ 5 5 4+  L 5 5 4+ 5 5 4+   Reflexes are 1+ and symmetric at the biceps, triceps, brachioradialis, patella and achilles.   Hoffman's is absent.   Bilateral upper and lower extremity sensation is intact to light touch.    No evidence of dysmetria noted.  Gait is antalgic.     Medical Decision Making  Imaging: MRI L spine 05/14/2023 IMPRESSION: 1. L4-5: Bilateral facet arthropathy with gaping, fluid-filled joints, worsened since 2023.  Anterolisthesis of 6 mm because of this facet arthropathy, which would likely worsen with standing or flexion. Circumferential bulging of the  disc. Severe multifactorial stenosis at this level that would likely worsen with standing or flexion. 2. L5-S1: Mild facet osteoarthritis. No stenosis.     Electronically Signed   By: Victoria [name] M.D.   On: 05/29/2023 16:38  I have personally reviewed the images and agree with the above interpretation.  Assessment and Plan: Victoria [name] is a pleasant 63 y.o. female with worsening anterolisthesis at L4-5 with severe facet arthropathy and implied instability as her anterolisthesis has worsened over the past 2 years.  She has back pain with bilateral sciatica.  She has had worsening findings on imaging basis over the last 2 years and has life limiting pain at this point.  I do not feel that further conservative management is indicated.  I recommended L4-5 transforaminal lumbar interbody fusion to address her anterolisthesis, implied lumbar instability, and back pain with severe nerve root compression.  I discussed the planned procedure at length with the patient, including the risks, benefits, alternatives, and indications. The risks discussed include but are not limited to bleeding, infection, need for reoperation, spinal fluid leak, stroke, vision loss, anesthetic complication, coma, paralysis, and even death. I also described in detail that improvement was not guaranteed.  The patient expressed understanding of these risks, and asked that we proceed with surgery. I described the surgery in layman's terms, and gave ample opportunity for questions, which were answered to the best of my ability.  We discussed that she should remain off of her buprenorphine until after surgery.  I have also asked her to minimize oxycodone  use.  Will send her for a DEXA study to ensure that she is not suffering from osteoporosis.   I spent a total of 30 minutes in this  patient's care today. This time was spent reviewing pertinent records including imaging studies, obtaining and confirming history, performing a directed evaluation, formulating and discussing my recommendations, and documenting the visit within the medical record.      Thank you for involving me in the care of this patient.      Tameko Halder K. Mont Antis MD, [facility]

## 2023-09-15 NOTE — Progress Notes (Signed)
 Referring Physician:  Rockney Cid, DO [address] [place],  [place] [address]  Primary Physician:  Rockney Cid, DO  History of Present Illness: 09/20/2023 [name] is here today with a chief complaint of back and leg pain.  This particularly severe when she stands or walks.  It has been worsening over time.  She has tried multiple different therapies.   History of Present Illness: [name] is a 63 y.o presenting today via telephone visit to review MRI results  06/24/2023 Note from Victoria Karvonen, PA-C    05/05/23 Victoria [name] is a 63 y.o. female who presents with the chief complaint of ongoing lumbar complaints.  She underwent Bilateral S1 transforaminal epidural injections with Dr. Erman [name] on 11/20/21.  She states that this is injection did not provide her with any relief and she has had worsening low back and bilateral radiating leg pain over the last year and a half.  She is ready to consider surgical interventions.   10/29/21 Victoria [name] is a 63 y.o presenting today via telephone visit to review her cervical, thoracic, and lumbar MRIs. She states that she continues to have the same symptoms she is having at her visit in April however they have increased in severity making it difficult for her to ambulate. She also endorses a full body rash with associated pustules. She has not followed up with her primary care provider about this recently.    09/25/2021 [name] is a 63 y.o with a history of SVT, obesity, history of melanoma, endometriosis, convulsions, fibrosis of the lung, depression, IBS, non alcoholic steatohepatitis, pernicious anemia, osteopenia, prediabetes, traumatic brain injury, stage 3 chronic kidney disease, bipolar, dyslipidemia, hypertension, asthma, ADHD, the anxiety, sleep apnea, hiatal hernia, fibromyalgia and GERD who is here today for further evaluation of her lumbar spine. She had a longstanding history of  lumbosacral complaints which has been treated with pain management in the past. She also states that she had a MILD procedure in 2021 in [place] which did not provide her with any substantial relief. She states that she has recent relocated and is looking for further evaluation and treatment. She states that she was previously scheduled for lumbar surgery however it was canceled due to her chronic kidney disease. Today she reports constant low back pain that is worse with any sort of activities including lifting her arms up, sitting up, standing, walking which will result in spasms in her low back. She does also endorse some mid back pain. She has numbness and pain that radiates into her lower abdomen with associated numbness in her anterior thighs and perineum. She does endorse urge type incontinence of both bowel and bladder for about the last 10 years. In addition to the symptoms she reports pain that radiates down the posterior aspects of both of her legs that she describes as sharp and shooting. She also endorses pain that wraps around her abdomen bilaterally and causes a tightness in her upper and lower abdomen with associated mid back pain. In addition to these complaints she describes pain in her neck with a sensation as though her neck bones are popping out of place with pain that radiates down both of her arms worse on the right than the left and into her hands. She also endorses decreased dexterity. Unfortunately she was about 15 minutes late for her appointment today which was only scheduled to discuss her back and we were not able to discuss her cervical spine and more  specific detail due to her lumbar spine being her more pressing concern and time constraints. Overall her symptoms are drastically affecting her quality of life and have decreased her ability to participate in physical activity which is resulted in weight gain. She has seen pain management and has recently had an increase in her  oxycodone  which has provided some mild relief.      Review of Systems:  A 10 point review of systems is negative, except for the pertinent positives and negatives detailed in the HPI.  Past Medical History: Past Medical History:  Diagnosis Date   Asthma    Cervical spinal stenosis    Fatty liver    GERD (gastroesophageal reflux disease)    Hypercholesterolemia    Hypertension    Lumbar stenosis    Lyme disease    Melanocarcinoma (HCC)    Osteoarthritis of both hips    Psychotic disorder (HCC)    Pulmonary fibrosis (HCC)    Sleep apnea     Past Surgical History: Past Surgical History:  Procedure Laterality Date   ABDOMINAL HYSTERECTOMY     APPENDECTOMY     BREAST EXCISIONAL BIOPSY Right 1997   neg   ESOPHAGOGASTRODUODENOSCOPY (EGD) WITH PROPOFOL  N/A 09/20/2022   Procedure: ESOPHAGOGASTRODUODENOSCOPY (EGD) WITH PROPOFOL ;  Surgeon: Victoria Salaam, MD;  Location: [facility] ENDOSCOPY;  Service: Gastroenterology;  Laterality: N/A;   HIATAL HERNIA REPAIR     Jan 2017   LAPAROSCOPY      Allergies: Allergies as of 09/20/2023 - Review Complete 09/20/2023  Allergen Reaction Noted   Acetaminophen  Other (See Comments) 08/24/2023   Atorvastatin  09/29/2021   Sertraline Hives and Shortness Of Breath 12/08/2006   Amoxicillin  07/05/2015   Aspirin  07/05/2015   Cefuroxime Hives 12/08/2006   Codeine  07/05/2015   Hydrocodone Hives 09/29/2021   Penicillins  07/05/2015   Sulfa antibiotics  07/05/2015    Medications:  Current Outpatient Medications:    albuterol  (VENTOLIN  HFA) 108 (90 Base) MCG/ACT inhaler, Inhale 1-2 puffs into the lungs every 6 (six) hours as needed for wheezing or shortness of breath., Disp: 18 g, Rfl: 1   baclofen (LIORESAL) 10 MG tablet, Take 20 mg by mouth., Disp: , Rfl:    buprenorphine (BUTRANS) 7.5 MCG/HR, Place 1 patch onto the skin., Disp: , Rfl:    cholecalciferol  (VITAMIN D ) 25 MCG tablet, Take 1 tablet (1,000 Units total) by mouth daily., Disp: 30 tablet,  Rfl: 1   conjugated estrogens (PREMARIN) vaginal cream, , Disp: , Rfl:    fluticasone  (FLONASE ) 50 MCG/ACT nasal spray, SPRAY 1 SPRAY INTO EACH NOSTRIL EVERY DAY AS NEEDED, Disp: , Rfl:    Fluticasone -Umeclidin-Vilant (TRELEGY ELLIPTA ) 200-62.5-25 MCG/ACT AEPB, Inhale 1 puff into the lungs daily at 12 noon., Disp: 60 each, Rfl: 2   gabapentin  (NEURONTIN ) 600 MG tablet, Take by mouth., Disp: , Rfl:    hydrocortisone 2.5 % cream, APPLY TO AFFECTED AREA TWICE A DAY, Disp: , Rfl:    ipratropium-albuterol  (DUONEB) 0.5-2.5 (3) MG/3ML SOLN, Take 3 mLs by nebulization 3 (three) times daily as needed (cough wheeze SOB bronchitis/asthma symptoms)., Disp: 180 mL, Rfl: 1   ketoconazole  (NIZORAL ) 2 % cream, Apply 1 Application topically daily., Disp: 15 g, Rfl: 0   MOVANTIK 25 MG TABS tablet, Take 25 mg by mouth daily., Disp: , Rfl:    ondansetron  (ZOFRAN ) 8 MG tablet, Take 1 tablet (8 mg total) by mouth every 8 (eight) hours as needed for nausea or vomiting., Disp: 20 tablet, Rfl: 0  Oxycodone  HCl 10 MG TABS, Take 10 mg by mouth every 6 (six) hours as needed., Disp: , Rfl:    pantoprazole  (PROTONIX ) 40 MG tablet, TAKE 1 TABLET BY MOUTH EVERY DAY IN THE MORNING, Disp: 90 tablet, Rfl: 0   polyvinyl alcohol  (LIQUIFILM TEARS) 1.4 % ophthalmic solution, Place 1 drop into both eyes 3 (three) times daily., Disp: 15 mL, Rfl: 1   Respiratory Therapy Supplies (NEBULIZER/TUBING/MOUTHPIECE) KIT, Disp one nebulizer machine, tubing set and mouthpiece kit, Disp: 1 kit, Rfl: 0   DULoxetine  (CYMBALTA ) 30 MG capsule, Take 30 mg by mouth 2 (two) times daily., Disp: , Rfl:   Social History: Social History   Tobacco Use   Smoking status: Former    Current packs/day: 0.00    Types: Cigarettes    Quit date: 1987    Years since quitting: 38.3   Smokeless tobacco: Never  Vaping Use   Vaping status: Never Used  Substance Use Topics   Alcohol  use: No    Alcohol /week: 0.0 standard drinks of alcohol    Drug use: No     Comment: No use of illict drugs.    Family Medical History: Family History  Problem Relation Age of Onset   Alcohol  abuse Mother    Arthritis Mother    Heart disease Mother    Liver disease Mother    Osteoporosis Mother    Clotting disorder Mother    Diabetes Father    Arthritis Father    Stroke Father    Sarcoidosis Father    Heart failure Father    Bipolar disorder Sister    Schizophrenia Sister    Depression Sister    Anxiety disorder Sister    Alcohol  abuse Sister    Diabetes Sister    Arthritis Sister    Thyroid  disease Sister    Lupus Sister    Depression Sister    Anxiety disorder Sister    Alcohol  abuse Sister    Depression Sister    Anxiety disorder Sister    Ehlers-Danlos syndrome Daughter     Physical Examination: Vitals:   09/20/23 1309  BP: 110/78    General: Patient is in no apparent distress. Attention to examination is appropriate.  Neck:   Supple.  Full range of motion.  Respiratory: Patient is breathing without any difficulty.   NEUROLOGICAL:     Awake, alert, oriented to person, place, and time.  Speech is clear and fluent.   Cranial Nerves: Pupils equal round and reactive to light.  Facial tone is symmetric.  Facial sensation is symmetric. Shoulder shrug is symmetric. Tongue protrusion is midline.  There is no pronator drift.  Strength: Side Biceps Triceps Deltoid Interossei Grip Wrist Ext. Wrist Flex.  R 5 5 5 5 5 5 5   L 5 5 5 5 5 5 5    Side Iliopsoas Quads Hamstring PF DF EHL  R 5 5 4+ 5 5 4+  L 5 5 4+ 5 5 4+   Reflexes are 1+ and symmetric at the biceps, triceps, brachioradialis, patella and achilles.   Hoffman's is absent.   Bilateral upper and lower extremity sensation is intact to light touch.    No evidence of dysmetria noted.  Gait is antalgic.     Medical Decision Making  Imaging: MRI L spine 05/14/2023 IMPRESSION: 1. L4-5: Bilateral facet arthropathy with gaping, fluid-filled joints, worsened since 2023.  Anterolisthesis of 6 mm because of this facet arthropathy, which would likely worsen with standing or flexion. Circumferential bulging of the  disc. Severe multifactorial stenosis at this level that would likely worsen with standing or flexion. 2. L5-S1: Mild facet osteoarthritis. No stenosis.     Electronically Signed   By: Bettylou Brunner M.D.   On: 05/29/2023 16:38  I have personally reviewed the images and agree with the above interpretation.  Assessment and Plan: Ms. Schaffer is a pleasant 63 y.o. female with worsening anterolisthesis at L4-5 with severe facet arthropathy and implied instability as her anterolisthesis has worsened over the past 2 years.  She has back pain with bilateral sciatica.  She has had worsening findings on imaging basis over the last 2 years and has life limiting pain at this point.  I do not feel that further conservative management is indicated.  I recommended L4-5 transforaminal lumbar interbody fusion to address her anterolisthesis, implied lumbar instability, and back pain with severe nerve root compression.  I discussed the planned procedure at length with the patient, including the risks, benefits, alternatives, and indications. The risks discussed include but are not limited to bleeding, infection, need for reoperation, spinal fluid leak, stroke, vision loss, anesthetic complication, coma, paralysis, and even death. I also described in detail that improvement was not guaranteed.  The patient expressed understanding of these risks, and asked that we proceed with surgery. I described the surgery in layman's terms, and gave ample opportunity for questions, which were answered to the best of my ability.  We discussed that she should remain off of her buprenorphine until after surgery.  I have also asked her to minimize oxycodone  use.  Will send her for a DEXA study to ensure that she is not suffering from osteoporosis.   I spent a total of 30 minutes in this  patient's care today. This time was spent reviewing pertinent records including imaging studies, obtaining and confirming history, performing a directed evaluation, formulating and discussing my recommendations, and documenting the visit within the medical record.      Thank you for involving me in the care of this patient.      Tameko Halder K. Mont Antis MD, [facility]

## 2023-09-20 ENCOUNTER — Ambulatory Visit: Admitting: Neurosurgery

## 2023-09-20 ENCOUNTER — Encounter: Payer: Self-pay | Admitting: Neurosurgery

## 2023-09-20 VITALS — BP 110/78 | Ht 65.0 in | Wt 211.0 lb

## 2023-09-20 DIAGNOSIS — M532X6 Spinal instabilities, lumbar region: Secondary | ICD-10-CM

## 2023-09-20 DIAGNOSIS — G8929 Other chronic pain: Secondary | ICD-10-CM | POA: Diagnosis not present

## 2023-09-20 DIAGNOSIS — M4316 Spondylolisthesis, lumbar region: Secondary | ICD-10-CM | POA: Diagnosis not present

## 2023-09-20 DIAGNOSIS — M5441 Lumbago with sciatica, right side: Secondary | ICD-10-CM

## 2023-09-20 DIAGNOSIS — M5442 Lumbago with sciatica, left side: Secondary | ICD-10-CM

## 2023-09-20 DIAGNOSIS — E2839 Other primary ovarian failure: Secondary | ICD-10-CM

## 2023-09-20 NOTE — Addendum Note (Signed)
 Addended by: Ashawnti Tangen on: 09/20/2023 02:07 PM   Modules accepted: Orders

## 2023-09-20 NOTE — Patient Instructions (Signed)
 Please see below for information in regards to your upcoming surgery:   DEXA (bone density scan): [facility] will call you to schedule this   Planned surgery: L4-5 minimally invasive (MIS) transforaminal lumbar interbody fusion (TLIF)   Surgery date: 10/17/23 at [facility] (Medical Mall: [address] [place], [place] [address]) - you will find out your arrival time the business day before your surgery.   Pre-op appointment at [facility] Pre-admit Testing: you will receive a call with a date/time for this appointment. If you are scheduled for an in person appointment, Pre-admit Testing is located on the first floor of the [facility] building, [address], [address]. During this appointment, they will advise you which medications you can take the morning of surgery, and which medications you will need to hold for surgery. Labs (such as blood work, EKG) may be done at your pre-op appointment. You are not required to fast for these labs. Should you need to change your pre-op appointment, please call Pre-admit testing at [phone].      Blood thinners:   Aspirin 325mg  (takes as needed for pain):   stop aspirin 325mg  7 days prior, can resume aspirin 325mg  14 days after         Surgical clearance: we will send a clearance form to Dr Bud Care. They may wish to see you in their office prior to signing the clearance form. If so, they may call you to schedule an appointment.      NSAIDS (Non-steroidal anti-inflammatory drugs): because you are having a fusion, please avoid taking any NSAIDS (examples: ibuprofen, motrin, aleve, naproxen, meloxicam , diclofenac) for 3 months after surgery. Celebrex  is an exception and is OK to take, if prescribed. Tylenol  is not an NSAID (check with Dr Bud Care if OK to take tylenol  due to liver issues).    Common restrictions after surgery: No bending, lifting, or twisting ("BLT"). Avoid lifting objects heavier  than 10 pounds for the first 6 weeks after surgery. Where possible, avoid household activities that involve lifting, bending, reaching, pushing, or pulling such as laundry, vacuuming, grocery shopping, and childcare. Try to arrange for help from friends and family for these activities while you heal. Do not drive while taking prescription pain medication. Weeks 6 through 12 after surgery: avoid lifting more than 25 pounds.    X-rays after surgery: Because you are having a fusion: for appointments after your 2 week follow-up: please arrive at the [facility] outpatient imaging center ([address], [address], [place]) or [facility] one hour prior to your appointment for x-rays. This applies to every appointment after your 2 week follow-up. Failure to do so may result in your appointment being rescheduled.   How to contact us :  If you have any questions/concerns before or after surgery, you can reach us  at [phone], or you can send a mychart message. We can be reached by phone or mychart 8am-4pm, Monday-Friday.  *Please note: Calls after 4pm are forwarded to a third party answering service. Mychart messages are not routinely monitored during evenings, weekends, and holidays. Please call our office to contact the answering service for urgent concerns during non-business hours.    If you have FMLA/disability paperwork, please drop it off or fax it to [phone], attention Patty.   Appointments/FMLA & disability paperwork: Gerlean Kocher, & Maryann Smalls Registered Nurses/Surgery schedulers: Bhavana Kady & Lauren Medical Assistants: Donnajean Fuse Physician Assistants: Ludwig Safer, PA-C, Anastacio Karvonen, PA-C & Lucetta Russel, PA-C  Surgeons: Jodeen Munch, MD & Henderson Lock, MD   [facility] PREADMIT TESTING VISIT and SURGERY INFORMATION SHEET   Now that surgery has been scheduled you can anticipate several phone calls from [facility] services. A  pharmacy technician will call you to verify your current list of medications taken at home.               The Pre-Service Center will call to verify your insurance information and to give you billing estimates and information.             The Preadmit Testing Office will be calling to schedule a visit to obtain information for the anesthesia team and provide instructions on preparation for surgery.  What can you expect for the Preadmit Testing Visit: Appointments may be scheduled in-person or by telephone.  If a telephone visit is scheduled, you may be asked to come into the office to have lab tests or other studies performed.   This visit will not be completed any greater than 14 days prior to your surgery.  If your surgery has been scheduled for a future date, please do not be alarmed if we have not contacted you to schedule an appointment more than a month prior to the surgery date.    Please be prepared to provide the following information during this appointment:            -Personal medical history                                               -Medication and allergy list            -Any history of problems with anesthesia              -Recent lab work or diagnostic studies            -Please notify us  of any needs we should be aware of to provide the best care possible           -You will be provided with instructions on how to prepare for your surgery.    On The Day of Surgery:  You must have a driver to take you home after surgery, you will be asked not to drive for 24 hours following surgery.  Taxi, [employer] and non-medical transport will not be acceptable means of transportation unless you have a responsible individual who will be traveling with you.  Visitors in the surgical area:   2 people will be able to visit you in your room once your preparation for surgery has been completed. During surgery, your visitors will be asked to wait in the Surgery Waiting Area.  It is not a requirement  for them to stay, if they prefer to leave and come back.  Your visitor(s) will be given an update once the surgery has been completed.  No visitors are allowed in the initial recovery room to respect patient privacy and safety.  Once you are more awake and transfer to the secondary recovery area, or are transferred to an inpatient room, visitors will again be able to see you.  To respect and protect your privacy: We will ask on the day of surgery who your driver will be and what the contact number for that individual will be. We will ask if it is okay to share information with  this individual, or if there is an alternative individual that we, or the surgeon, should contact to provide updates and information. If family or friends come to the surgical information desk requesting information about you, who you have not listed with us , no information will be given.   It may be helpful to designate someone as the main contact who will be responsible for updating your other friends and family.    PREADMIT TESTING OFFICE: [phone] SAME DAY SURGERY: [phone] We look forward to caring for you before and throughout the process of your surgery.

## 2023-09-20 NOTE — Progress Notes (Signed)
 Faxed order for DEXA to [facility] via [facility]

## 2023-09-21 ENCOUNTER — Encounter: Payer: Self-pay | Admitting: Urgent Care

## 2023-09-21 DIAGNOSIS — M8588 Other specified disorders of bone density and structure, other site: Secondary | ICD-10-CM | POA: Diagnosis not present

## 2023-09-22 ENCOUNTER — Other Ambulatory Visit: Payer: Self-pay

## 2023-09-22 DIAGNOSIS — Z01818 Encounter for other preprocedural examination: Secondary | ICD-10-CM

## 2023-09-22 NOTE — Progress Notes (Signed)
 Reviewed allergy list with Renate Caroline, NP on 09/21/23. Per Geraldean Klein, "has tolerated a 1st generation (CEPHALEXIN) in the past. She should be fine for CEFAZOLIN".

## 2023-09-27 ENCOUNTER — Ambulatory Visit (INDEPENDENT_AMBULATORY_CARE_PROVIDER_SITE_OTHER): Admitting: Internal Medicine

## 2023-09-27 ENCOUNTER — Other Ambulatory Visit: Payer: Self-pay

## 2023-09-27 ENCOUNTER — Encounter: Payer: Self-pay | Admitting: Internal Medicine

## 2023-09-27 ENCOUNTER — Other Ambulatory Visit: Payer: Self-pay | Admitting: Internal Medicine

## 2023-09-27 ENCOUNTER — Telehealth: Payer: Self-pay

## 2023-09-27 VITALS — BP 132/76 | HR 96 | Temp 98.5°F | Resp 16 | Ht 65.0 in | Wt 208.1 lb

## 2023-09-27 DIAGNOSIS — J45909 Unspecified asthma, uncomplicated: Secondary | ICD-10-CM | POA: Diagnosis not present

## 2023-09-27 DIAGNOSIS — M48061 Spinal stenosis, lumbar region without neurogenic claudication: Secondary | ICD-10-CM | POA: Diagnosis not present

## 2023-09-27 DIAGNOSIS — Z01818 Encounter for other preprocedural examination: Secondary | ICD-10-CM | POA: Diagnosis not present

## 2023-09-27 MED ORDER — BUDESONIDE-FORMOTEROL FUMARATE 160-4.5 MCG/ACT IN AERO: 2.0000 | INHALATION_SPRAY | Freq: Two times a day (BID) | RESPIRATORY_TRACT | 3 refills | Status: DC

## 2023-09-27 NOTE — Progress Notes (Addendum)
 Established Patient Office Visit  Subjective   Patient ID: Victoria [name], female    DOB: 1960/08/11  Age: 63 y.o. MRN: [id number]  Chief Complaint  Patient presents with   Back Pain    Surgical clearance for 5/19 with Nerusurgery    HPI  Patient is here for pre-op evaluation.   Type of Surgery: L4-5 MIS trans-foraminal lumbar interbody fusion  Date of Surgery: 10/17/23 Doctor Performing Surgery: Dr. Mont Antis  Hx of surgical complications: nasal surgery 25 years ago with large amount of blood loss but did not require blood transfusion Hx of anesthesia complications: had a poor reaction to anesthetic used during nasal surgery 25 years ago  Hx of MI/stenting: No COPD: no, controlled asthma  DM: no, prediabetic  Patient is a former smoker, quit over 25 years ago. No regular alcohol  use or illicit drug use.  Revised Cardiac Risk Index for major cardiac event (RCRI): 0.08% estimated risk probability for perioperative MI Pulmonary Risk Score (ARISCAT): moderate risk at 13.3% risk of in-hospital postoperative pulmonary complications Labwork: no anemia   Patient Active Problem List   Diagnosis Date Noted   Nausea and vomiting 09/20/2022   At low risk for fall 06/16/2022   Hyperlipidemia 09/29/2021   OSA (obstructive sleep apnea) 09/29/2021   Vitamin D  deficiency 09/29/2021   Long term (current) use of opiate analgesic 04/06/2021   Issue of repeat prescription for medication 04/06/2021   SVT (supraventricular tachycardia) (HCC) 04/09/2020   Vaginal atrophy 04/09/2020   Class 1 obesity due to excess calories with serious comorbidity and body mass index (BMI) of 31.0 to 31.9 in adult 02/07/2020   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   Chronic fatigue 01/07/2020   History of melanoma 01/07/2020   Endometriosis 01/07/2020   Fibrosis of lung (HCC) 12/12/2019   Nonalcoholic steatohepatitis 12/12/2019   Convulsions (HCC) 12/12/2019   Irritable bowel syndrome 12/12/2019    Osteopenia 12/12/2019   Pernicious anemia 12/12/2019   Prediabetes 12/12/2019   Traumatic brain injury (HCC) 12/12/2019   Psychosis (HCC) 11/15/2019   Chronic pain syndrome 11/15/2019   Corneal abrasion 11/15/2019   Suicidal thoughts 11/15/2019   MDD (major depressive disorder), single episode, severe with psychosis (HCC) 11/14/2019   Migraine 07/30/2019   Stage 3a chronic kidney disease (HCC) 07/25/2019   Spinal stenosis of lumbar region with neurogenic claudication 07/17/2019   Skin lesion 01/02/2019   Spondylolisthesis of lumbar region 04/24/2018   Numbness and tingling in both hands 12/30/2017   Cervical radiculopathy 10/25/2017   Pain medication agreement 10/25/2017   Sacroiliitis (HCC) 05/03/2016   Neuropathy 01/06/2016   Right hip pain 01/06/2016   MDD (major depressive disorder), recurrent, severe, with psychosis (HCC)    Osteoarthritis of both hips 07/06/2015   Dyslipidemia 07/06/2015   GERD (gastroesophageal reflux disease) 07/06/2015   Asthma 07/06/2015   Bipolar I disorder, most recent episode manic, severe with psychotic features (HCC) 07/06/2015   Attention-deficit hyperactivity disorder, predominantly inattentive type 02/25/2014   Elevation of levels of liver transaminase levels 01/02/2014   Impaired fasting glucose 01/02/2014   Melanosis coli 09/20/2012   Screening for colon cancer 09/07/2012   Lactose intolerance 03/06/2012   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   Mild persistent asthma 03/26/2009   Acquired absence of other genital organ(s) 10/30/2008   Hx of total hysterectomy 10/30/2008   Lateral epicondylitis, unspecified elbow 08/03/2006   Tinnitus, unspecified ear 08/11/2004   Past Medical History:  Diagnosis Date   Asthma    Cervical spinal stenosis  Fatty liver    GERD (gastroesophageal reflux disease)    Hypercholesterolemia    Hypertension    Lumbar stenosis    Lyme disease    Melanocarcinoma (HCC)    Osteoarthritis of both hips    Psychotic  disorder (HCC)    Pulmonary fibrosis (HCC)    Sleep apnea    Past Surgical History:  Procedure Laterality Date   ABDOMINAL HYSTERECTOMY     APPENDECTOMY     BREAST EXCISIONAL BIOPSY Right 1997   neg   ESOPHAGOGASTRODUODENOSCOPY (EGD) WITH PROPOFOL  N/A 09/20/2022   Procedure: ESOPHAGOGASTRODUODENOSCOPY (EGD) WITH PROPOFOL ;  Surgeon: Luke Salaam, MD;  Location: [facility] ENDOSCOPY;  Service: Gastroenterology;  Laterality: N/A;   HIATAL HERNIA REPAIR     Jan 2017   LAPAROSCOPY     Social History   Tobacco Use   Smoking status: Former    Current packs/day: 0.00    Types: Cigarettes    Quit date: 1987    Years since quitting: 38.3   Smokeless tobacco: Never  Vaping Use   Vaping status: Never Used  Substance Use Topics   Alcohol  use: No    Alcohol /week: 0.0 standard drinks of alcohol    Drug use: No    Comment: No use of illict drugs.   Social History   Socioeconomic History   Marital status: Married    Spouse name: bruce   Number of children: 1   Years of education: Not on file   Highest education level: Associate degree: academic program  Occupational History   Occupation: home maker  Tobacco Use   Smoking status: Former    Current packs/day: 0.00    Types: Cigarettes    Quit date: 1987    Years since quitting: 38.3   Smokeless tobacco: Never  Vaping Use   Vaping status: Never Used  Substance and Sexual Activity   Alcohol  use: No    Alcohol /week: 0.0 standard drinks of alcohol    Drug use: No    Comment: No use of illict drugs.   Sexual activity: Not Currently  Other Topics Concern   Not on file  Social History Narrative   Lives with husband at home.   Social Drivers of Corporate investment banker Strain: Low Risk  (08/25/2023)   Overall Financial Resource Strain (CARDIA)    Difficulty of Paying Living Expenses: Not hard at all  Food Insecurity: No Food Insecurity (08/25/2023)   Hunger Vital Sign    Worried About Running Out of Food in the Last Year: Never true     Ran Out of Food in the Last Year: Never true  Transportation Needs: No Transportation Needs (08/25/2023)   PRAPARE - Administrator, Civil Service (Medical): No    Lack of Transportation (Non-Medical): No  Physical Activity: Insufficiently Active (08/25/2023)   Exercise Vital Sign    Days of Exercise per Week: 7 days    Minutes of Exercise per Session: 20 min  Stress: No Stress Concern Present (08/25/2023)   [employer] of Occupational Health - Occupational Stress Questionnaire    Feeling of Stress : Only a little  Social Connections: Moderately Isolated (08/25/2023)   Social Connection and Isolation Panel [NHANES]    Frequency of Communication with Friends and Family: Once a week    Frequency of Social Gatherings with Friends and Family: Never    Attends Religious Services: 1 to 4 times per year    Active Member of [employer] or Organizations: No    Attends [employer]  or Organization Meetings: Never    Marital Status: Married  Catering manager Violence: Not At Risk (08/25/2023)   Humiliation, Afraid, Rape, and Kick questionnaire    Fear of Current or Ex-Partner: No    Emotionally Abused: No    Physically Abused: No    Sexually Abused: No   Family Status  Relation Name Status   Mother  Deceased   Father  Alive   Sister  Deceased   Sister  Alive   Sister  Alive   Daughter 1 Alive  No partnership data on file   Family History  Problem Relation Age of Onset   Alcohol  abuse Mother    Arthritis Mother    Heart disease Mother    Liver disease Mother    Osteoporosis Mother    Clotting disorder Mother    Diabetes Father    Arthritis Father    Stroke Father    Sarcoidosis Father    Heart failure Father    Bipolar disorder Sister    Schizophrenia Sister    Depression Sister    Anxiety disorder Sister    Alcohol  abuse Sister    Diabetes Sister    Arthritis Sister    Thyroid  disease Sister    Lupus Sister    Depression Sister    Anxiety disorder Sister    Alcohol   abuse Sister    Depression Sister    Anxiety disorder Sister    Ehlers-Danlos syndrome Daughter    Allergies  Allergen Reactions   Acetaminophen  Other (See Comments)    Liver issues   Atorvastatin     Myalgias    Sertraline Hives and Shortness Of Breath   Cefuroxime Hives    Has tolerated 1st generation cephalosporin (CEPHALEXIN) in the past with no documented ADRs.    Codeine Hives   Hydrocodone Hives   Amoxicillin Rash   Penicillins Rash    Has tolerated 1st generation cephalosporin (CEPHALEXIN) in the past with no documented ADRs.    Sulfa Antibiotics Rash      Review of Systems  All other systems reviewed and are negative.     Objective:     BP 132/76 (Cuff Size: Large)   Pulse 96   Temp 98.5 F (36.9 C) (Oral)   Resp 16   Ht 5\' 5"  (1.651 m)   Wt 208 lb 1.6 oz (94.4 kg)   SpO2 94%   BMI 34.63 kg/m  BP Readings from Last 3 Encounters:  09/27/23 132/76  09/20/23 110/78  07/08/23 130/80   Wt Readings from Last 3 Encounters:  09/27/23 208 lb 1.6 oz (94.4 kg)  09/20/23 211 lb (95.7 kg)  07/08/23 211 lb 6.4 oz (95.9 kg)      Physical Exam Constitutional:      Appearance: Normal appearance.  HENT:     Head: Normocephalic and atraumatic.     Mouth/Throat:     Mouth: Mucous membranes are moist.     Pharynx: Oropharynx is clear.  Eyes:     Extraocular Movements: Extraocular movements intact.     Conjunctiva/sclera: Conjunctivae normal.     Pupils: Pupils are equal, round, and reactive to light.  Neck:     Comments: No thyromegaly Cardiovascular:     Rate and Rhythm: Normal rate and regular rhythm.  Pulmonary:     Effort: Pulmonary effort is normal.     Breath sounds: Normal breath sounds.  Musculoskeletal:     Cervical back: No tenderness.  Lymphadenopathy:     Cervical: No  cervical adenopathy.  Skin:    General: Skin is warm and dry.  Neurological:     General: No focal deficit present.     Mental Status: She is alert. Mental status is at  baseline.  Psychiatric:        Mood and Affect: Mood normal.        Behavior: Behavior normal.      No results found for any visits on 09/27/23.  Last CBC Lab Results  Component Value Date   WBC 6.2 02/10/2023   HGB 15.2 02/10/2023   HCT 46.6 02/10/2023   MCV 96 02/10/2023   MCH 31.1 02/10/2023   RDW 13.5 02/10/2023   PLT 214 02/10/2023   Last metabolic panel Lab Results  Component Value Date   GLUCOSE 125 (H) 07/08/2023   NA 140 07/08/2023   K 4.1 07/08/2023   CL 102 07/08/2023   CO2 27 07/08/2023   BUN 11 07/08/2023   CREATININE 1.08 (H) 07/08/2023   EGFR 58 (L) 07/08/2023   CALCIUM 9.4 07/08/2023   PROT 7.6 07/08/2023   ALBUMIN 4.3 02/10/2023   LABGLOB 2.7 02/10/2023   AGRATIO 1.8 09/13/2022   BILITOT 0.5 07/08/2023   ALKPHOS 124 (H) 02/10/2023   AST 92 (H) 07/08/2023   ALT 85 (H) 07/08/2023   ANIONGAP 8 04/18/2022   Last lipids Lab Results  Component Value Date   CHOL 278 (H) 07/08/2023   HDL 49 (L) 07/08/2023   LDLCALC 190 (H) 07/08/2023   TRIG 209 (H) 07/08/2023   CHOLHDL 5.7 (H) 07/08/2023   Last hemoglobin A1c Lab Results  Component Value Date   HGBA1C 6.6 (H) 07/08/2023   Last thyroid  functions Lab Results  Component Value Date   TSH 0.98 10/13/2021   Last vitamin D  Lab Results  Component Value Date   VD25OH 53 09/29/2021   Last vitamin B12 and Folate Lab Results  Component Value Date   VITAMINB12 1,111 (H) 09/29/2021      The 10-year ASCVD risk score (Arnett DK, et al., 2019) is: 6.2%    Assessment & Plan:   1. Pre-op exam (Primary)/Spinal stenosis of lumbar region, unspecified whether neurogenic claudication present: Planning for surgery May 19th, has pre-op appointment with surgeon's office as well on May 5 for pre-op labs. Patient may proceed this procedure as scheduled.   2. Moderate asthma without complication, unspecified whether persistent: Patient cannot tolerate Trelegy or Breztri, will switch to Symbicort  instead.    - budesonide -formoterol  (SYMBICORT ) 160-4.5 MCG/ACT inhaler; Inhale 2 puffs into the lungs 2 (two) times daily.  Dispense: 1 each; Refill: 3   Return in about 3 months (around 12/27/2023).    Rockney Cid, DO

## 2023-09-27 NOTE — Telephone Encounter (Signed)
-----   Message from [facility] sent at 09/26/2023  4:31 PM EDT ----- Fine to proceed  She should discuss with her PCP but ok to do that after surgery ----- Message ----- From: Jinna Weinman, RN Sent: 09/26/2023   3:13 PM EDT To: Jodeen Munch, MD  It says osteopenia. Sounds like you are OK with proceeding with surgery. Does she need to follow up with her PCP about the osteopenia? ----- Message ----- From: Jodeen Munch, MD Sent: 09/22/2023   8:28 AM EDT To: Barbra Ley McWhinnie, RN; Marca Senna, RN  All good ----- Message ----- From: Chantal Comment Sent: 09/21/2023   4:10 PM EDT To: Jodeen Munch, MD  DEXA report

## 2023-10-04 ENCOUNTER — Other Ambulatory Visit: Payer: Self-pay

## 2023-10-04 ENCOUNTER — Inpatient Hospital Stay: Admission: RE | Admit: 2023-10-04 | Source: Ambulatory Visit

## 2023-10-04 ENCOUNTER — Encounter
Admission: RE | Admit: 2023-10-04 | Discharge: 2023-10-04 | Disposition: A | Discharge status: Home / Self Care | Source: Ambulatory Visit | Attending: Neurosurgery | Admitting: Neurosurgery

## 2023-10-04 VITALS — BP 110/72 | HR 74 | Temp 98.2°F | Ht 64.5 in | Wt 205.0 lb

## 2023-10-04 DIAGNOSIS — I10 Essential (primary) hypertension: Secondary | ICD-10-CM | POA: Insufficient documentation

## 2023-10-04 DIAGNOSIS — Z01810 Encounter for preprocedural cardiovascular examination: Secondary | ICD-10-CM | POA: Diagnosis not present

## 2023-10-04 DIAGNOSIS — J984 Other disorders of lung: Secondary | ICD-10-CM | POA: Diagnosis not present

## 2023-10-04 DIAGNOSIS — Z01812 Encounter for preprocedural laboratory examination: Secondary | ICD-10-CM

## 2023-10-04 DIAGNOSIS — Z01818 Encounter for other preprocedural examination: Secondary | ICD-10-CM | POA: Insufficient documentation

## 2023-10-04 HISTORY — DX: Other complications of anesthesia, initial encounter: T88.59XA

## 2023-10-04 HISTORY — DX: Dyspnea, unspecified: R06.00

## 2023-10-04 HISTORY — DX: Prediabetes: R73.03

## 2023-10-04 HISTORY — DX: Cardiac arrhythmia, unspecified: I49.9

## 2023-10-04 LAB — CBC
HCT: 46.3 % — ABNORMAL HIGH (ref 36.0–46.0)
Hemoglobin: 15 g/dL (ref 12.0–15.0)
MCH: 30.5 pg (ref 26.0–34.0)
MCHC: 32.4 g/dL (ref 30.0–36.0)
MCV: 94.3 fL (ref 80.0–100.0)
Platelets: 200 10*3/uL (ref 150–400)
RBC: 4.91 MIL/uL (ref 3.87–5.11)
RDW: 14.6 % (ref 11.5–15.5)
WBC: 6.7 10*3/uL (ref 4.0–10.5)
nRBC: 0 % (ref 0.0–0.2)

## 2023-10-04 LAB — URINALYSIS, ROUTINE W REFLEX MICROSCOPIC
Bilirubin Urine: NEGATIVE
Glucose, UA: NEGATIVE mg/dL
Hgb urine dipstick: NEGATIVE
Ketones, ur: NEGATIVE mg/dL
Leukocytes,Ua: NEGATIVE
Nitrite: NEGATIVE
Protein, ur: NEGATIVE mg/dL
Specific Gravity, Urine: 1.018 (ref 1.005–1.030)
pH: 5 (ref 5.0–8.0)

## 2023-10-04 LAB — TYPE AND SCREEN
ABO/RH(D): O POS
Antibody Screen: NEGATIVE

## 2023-10-04 LAB — SURGICAL PCR SCREEN
MRSA, PCR: NEGATIVE
Staphylococcus aureus: NEGATIVE

## 2023-10-04 NOTE — Patient Instructions (Addendum)
 Your procedure is scheduled on: Monday 10/17/23  Report to the Registration Desk on the 1st floor of the [facility]. To find out your arrival time, please call [phone] between 1PM - 3PM on: Friday 10/14/23  If your arrival time is 6:00 am, do not arrive before that time as the [facility] entrance doors do not open until 6:00 am.  REMEMBER: Instructions that are not followed completely may result in serious medical risk, up to and including death; or upon the discretion of your surgeon and anesthesiologist your surgery may need to be rescheduled.  Do not eat food after midnight the night before surgery.  No gum chewing or hard candies.  You may however, drink CLEAR liquids up to 2 hours before you are scheduled to arrive for your surgery. Do not drink anything within 2 hours of your scheduled arrival time.  Clear liquids include: - water   One week prior to surgery: Stop Anti-inflammatories (NSAIDS) such as Advil, Aleve, Ibuprofen, Motrin, Naproxen, Naprosyn and Aspirin based products such as Excedrin, Goody's Powder, BC Powder. Stop ALL OVER THE COUNTER supplements until after surgery. ( On Monday 10/10/23)  You may however, continue to take Tylenol  if needed for pain up until the day of surgery.  Stop aspirin 325 MG 7 days prior to your surgery (take last dose Sunday Oct 09, 2023)   Continue taking all of your other prescription medications up until the day of surgery.  ON THE DAY OF SURGERY ONLY TAKE THESE MEDICATIONS WITH SIPS OF WATER:  DULoxetine  (CYMBALTA ) 30 MG  pantoprazole  (PROTONIX ) 40 MG  gabapentin  (NEURONTIN ) 800 MG  Oxycodone  HCl 10 MG  (if needed)   Use inhalers on the day of surgery and bring to the hospital. albuterol  (VENTOLIN  HFA) 108 (90 Base) MCG/ACT inhaler  fluticasone  furoate-vilanterol (BREO ELLIPTA ) 200-25 MCG/ACT AEPB   No Alcohol  for 24 hours before or after surgery.  No Smoking including e-cigarettes for 24 hours before surgery.  No  chewable tobacco products for at least 6 hours before surgery.  No nicotine patches on the day of surgery.  Do not use any "recreational" drugs for at least a week (preferably 2 weeks) before your surgery.  Please be advised that the combination of cocaine and anesthesia may have negative outcomes, up to and including death. If you test positive for cocaine, your surgery will be cancelled.  On the morning of surgery brush your teeth with toothpaste and water, you may rinse your mouth with mouthwash if you wish. Do not swallow any toothpaste or mouthwash.  Use CHG Soap or wipes as directed on instruction sheet.  Do not wear jewelry, make-up, hairpins, clips or nail polish.  For welded (permanent) jewelry: bracelets, anklets, waist bands, etc.  Please have this removed prior to surgery.  If it is not removed, there is a chance that hospital personnel will need to cut it off on the day of surgery.  Do not wear lotions, powders, or perfumes.   Do not shave body hair from the neck down 48 hours before surgery.  Contact lenses, hearing aids and dentures may not be worn into surgery.  Do not bring valuables to the hospital. [facility] is not responsible for any missing/lost belongings or valuables.   Total Shoulder Arthroplasty:  use Benzoyl Peroxide 5% Gel as directed on instruction sheet.  Bring your C-PAP to the hospital in case you may have to spend the night.   Notify your doctor if there is any change in  your medical condition (cold, fever, infection).  Wear comfortable clothing (specific to your surgery type) to the hospital.  After surgery, you can help prevent lung complications by doing breathing exercises.  Take deep breaths and cough every 1-2 hours. Your doctor may order a device called an Incentive Spirometer to help you take deep breaths. When coughing or sneezing, hold a pillow firmly against your incision with both hands. This is called "splinting." Doing this helps  protect your incision. It also decreases belly discomfort.  If you are being admitted to the hospital overnight, leave your suitcase in the car. After surgery it may be brought to your room.  In case of increased patient census, it may be necessary for you, the patient, to continue your postoperative care in the Same Day Surgery department.  If you are being discharged the day of surgery, you will not be allowed to drive home. You will need a responsible individual to drive you home and stay with you for 24 hours after surgery.   If you are taking public transportation, you will need to have a responsible individual with you.  Please call the Pre-admissions Testing Dept. at [phone] if you have any questions about these instructions.  Surgery Visitation Policy:  Patients having surgery or a procedure may have two visitors.  Children under the age of 9 must have an adult with them who is not the patient.  Inpatient Visitation:    Visiting hours are 7 a.m. to 8 p.m. Up to four visitors are allowed at one time in a patient room. The visitors may rotate out with other people during the day.  One visitor age 76 or older may stay with the patient overnight and must be in the room by 8 p.m.    Pre-operative 5 CHG Bath Instructions   You can play a key role in reducing the risk of infection after surgery. Your skin needs to be as free of germs as possible. You can reduce the number of germs on your skin by washing with CHG (chlorhexidine gluconate) soap before surgery. CHG is an antiseptic soap that kills germs and continues to kill germs even after washing.   DO NOT use if you have an allergy to chlorhexidine/CHG or antibacterial soaps. If your skin becomes reddened or irritated, stop using the CHG and notify one of our RNs at [phone].   Please shower with the CHG soap starting 4 days before surgery using the following schedule:     Please keep in mind the following:  DO NOT  shave, including legs and underarms, starting the day of your first shower.   You may shave your face at any point before/day of surgery.  Place clean sheets on your bed the day you start using CHG soap. Use a clean washcloth (not used since being washed) for each shower. DO NOT sleep with pets once you start using the CHG.   CHG Shower Instructions:  If you choose to wash your hair and private area, wash first with your normal shampoo/soap.  After you use shampoo/soap, rinse your hair and body thoroughly to remove shampoo/soap residue.  Turn the water OFF and apply about 3 tablespoons (45 ml) of CHG soap to a CLEAN washcloth.  Apply CHG soap ONLY FROM YOUR NECK DOWN TO YOUR TOES (washing for 3-5 minutes)  DO NOT use CHG soap on face, private areas, open wounds, or sores.  Pay special attention to the area where your surgery is being performed.  If you are having back surgery, having someone wash your back for you may be helpful. Wait 2 minutes after CHG soap is applied, then you may rinse off the CHG soap.  Pat dry with a clean towel  Put on clean clothes/pajamas   If you choose to wear lotion, please use ONLY the CHG-compatible lotions on the back of this paper.     Additional instructions for the day of surgery: DO NOT APPLY any lotions, deodorants, cologne, or perfumes.   Put on clean/comfortable clothes.  Brush your teeth.  Ask your nurse before applying any prescription medications to the skin.      CHG Compatible Lotions   Aveeno Moisturizing lotion  Cetaphil Moisturizing Cream  Cetaphil Moisturizing Lotion  Clairol Herbal Essence Moisturizing Lotion, Dry Skin  Clairol Herbal Essence Moisturizing Lotion, Extra Dry Skin  Clairol Herbal Essence Moisturizing Lotion, Normal Skin  Curel Age Defying Therapeutic Moisturizing Lotion with Alpha Hydroxy  Curel Extreme Care Body Lotion  Curel Soothing Hands Moisturizing Hand Lotion  Curel Therapeutic Moisturizing Cream,  Fragrance-Free  Curel Therapeutic Moisturizing Lotion, Fragrance-Free  Curel Therapeutic Moisturizing Lotion, Original Formula  Eucerin Daily Replenishing Lotion  Eucerin Dry Skin Therapy Plus Alpha Hydroxy Crme  Eucerin Dry Skin Therapy Plus Alpha Hydroxy Lotion  Eucerin Original Crme  Eucerin Original Lotion  Eucerin Plus Crme Eucerin Plus Lotion  Eucerin TriLipid Replenishing Lotion  Keri Anti-Bacterial Hand Lotion  Keri Deep Conditioning Original Lotion Dry Skin Formula Softly Scented  Keri Deep Conditioning Original Lotion, Fragrance Free Sensitive Skin Formula  Keri Lotion Fast Absorbing Fragrance Free Sensitive Skin Formula  Keri Lotion Fast Absorbing Softly Scented Dry Skin Formula  Keri Original Lotion  Keri Skin Renewal Lotion Keri Silky Smooth Lotion  Keri Silky Smooth Sensitive Skin Lotion  Nivea Body Creamy Conditioning Oil  Nivea Body Extra Enriched Teacher, adult education Moisturizing Lotion Nivea Crme  Nivea Skin Firming Lotion  NutraDerm 30 Skin Lotion  NutraDerm Skin Lotion  NutraDerm Therapeutic Skin Cream  NutraDerm Therapeutic Skin Lotion  ProShield Protective Hand Cream  Provon moisturizing lotion

## 2023-10-17 ENCOUNTER — Inpatient Hospital Stay: Payer: Self-pay | Admitting: Urgent Care

## 2023-10-17 ENCOUNTER — Other Ambulatory Visit: Payer: Self-pay

## 2023-10-17 ENCOUNTER — Encounter: Payer: Self-pay | Admitting: Neurosurgery

## 2023-10-17 ENCOUNTER — Encounter
Admission: RE | Disposition: A | Discharge status: Home / Self Care | Payer: Self-pay | Source: Home / Self Care | Attending: Neurosurgery

## 2023-10-17 ENCOUNTER — Inpatient Hospital Stay

## 2023-10-17 ENCOUNTER — Inpatient Hospital Stay: Admitting: Certified Registered"

## 2023-10-17 ENCOUNTER — Inpatient Hospital Stay
Admission: RE | Admit: 2023-10-17 | Discharge: 2023-10-22 | DRG: 402 | Disposition: A | Discharge status: Home / Self Care | Attending: Neurosurgery | Admitting: Neurosurgery

## 2023-10-17 DIAGNOSIS — Z87891 Personal history of nicotine dependence: Secondary | ICD-10-CM

## 2023-10-17 DIAGNOSIS — Z7951 Long term (current) use of inhaled steroids: Secondary | ICD-10-CM

## 2023-10-17 DIAGNOSIS — Z85820 Personal history of malignant melanoma of skin: Secondary | ICD-10-CM

## 2023-10-17 DIAGNOSIS — R918 Other nonspecific abnormal finding of lung field: Secondary | ICD-10-CM | POA: Diagnosis not present

## 2023-10-17 DIAGNOSIS — M5441 Lumbago with sciatica, right side: Secondary | ICD-10-CM | POA: Diagnosis not present

## 2023-10-17 DIAGNOSIS — K760 Fatty (change of) liver, not elsewhere classified: Secondary | ICD-10-CM | POA: Diagnosis not present

## 2023-10-17 DIAGNOSIS — M160 Bilateral primary osteoarthritis of hip: Secondary | ICD-10-CM | POA: Diagnosis present

## 2023-10-17 DIAGNOSIS — M5442 Lumbago with sciatica, left side: Secondary | ICD-10-CM | POA: Diagnosis present

## 2023-10-17 DIAGNOSIS — J90 Pleural effusion, not elsewhere classified: Secondary | ICD-10-CM | POA: Diagnosis not present

## 2023-10-17 DIAGNOSIS — K7581 Nonalcoholic steatohepatitis (NASH): Secondary | ICD-10-CM | POA: Diagnosis not present

## 2023-10-17 DIAGNOSIS — Z8249 Family history of ischemic heart disease and other diseases of the circulatory system: Secondary | ICD-10-CM

## 2023-10-17 DIAGNOSIS — M4316 Spondylolisthesis, lumbar region: Secondary | ICD-10-CM | POA: Diagnosis not present

## 2023-10-17 DIAGNOSIS — G9782 Other postprocedural complications and disorders of nervous system: Secondary | ICD-10-CM | POA: Diagnosis present

## 2023-10-17 DIAGNOSIS — M542 Cervicalgia: Secondary | ICD-10-CM | POA: Diagnosis present

## 2023-10-17 DIAGNOSIS — Z01818 Encounter for other preprocedural examination: Secondary | ICD-10-CM

## 2023-10-17 DIAGNOSIS — Z79890 Hormone replacement therapy (postmenopausal): Secondary | ICD-10-CM

## 2023-10-17 DIAGNOSIS — Z6837 Body mass index (BMI) 37.0-37.9, adult: Secondary | ICD-10-CM

## 2023-10-17 DIAGNOSIS — R152 Fecal urgency: Secondary | ICD-10-CM | POA: Diagnosis present

## 2023-10-17 DIAGNOSIS — F312 Bipolar disorder, current episode manic severe with psychotic features: Secondary | ICD-10-CM | POA: Diagnosis present

## 2023-10-17 DIAGNOSIS — I10 Essential (primary) hypertension: Secondary | ICD-10-CM | POA: Diagnosis present

## 2023-10-17 DIAGNOSIS — J849 Interstitial pulmonary disease, unspecified: Secondary | ICD-10-CM | POA: Diagnosis not present

## 2023-10-17 DIAGNOSIS — K589 Irritable bowel syndrome without diarrhea: Secondary | ICD-10-CM | POA: Diagnosis present

## 2023-10-17 DIAGNOSIS — Z1152 Encounter for screening for COVID-19: Secondary | ICD-10-CM

## 2023-10-17 DIAGNOSIS — M797 Fibromyalgia: Secondary | ICD-10-CM | POA: Diagnosis present

## 2023-10-17 DIAGNOSIS — I129 Hypertensive chronic kidney disease with stage 1 through stage 4 chronic kidney disease, or unspecified chronic kidney disease: Secondary | ICD-10-CM | POA: Diagnosis not present

## 2023-10-17 DIAGNOSIS — E7800 Pure hypercholesterolemia, unspecified: Secondary | ICD-10-CM | POA: Diagnosis not present

## 2023-10-17 DIAGNOSIS — E669 Obesity, unspecified: Secondary | ICD-10-CM | POA: Diagnosis present

## 2023-10-17 DIAGNOSIS — G4733 Obstructive sleep apnea (adult) (pediatric): Secondary | ICD-10-CM | POA: Diagnosis present

## 2023-10-17 DIAGNOSIS — G8929 Other chronic pain: Secondary | ICD-10-CM | POA: Diagnosis present

## 2023-10-17 DIAGNOSIS — L080 Pyoderma: Secondary | ICD-10-CM | POA: Diagnosis present

## 2023-10-17 DIAGNOSIS — G9741 Accidental puncture or laceration of dura during a procedure: Secondary | ICD-10-CM | POA: Diagnosis not present

## 2023-10-17 DIAGNOSIS — R7303 Prediabetes: Secondary | ICD-10-CM | POA: Diagnosis present

## 2023-10-17 DIAGNOSIS — J45909 Unspecified asthma, uncomplicated: Secondary | ICD-10-CM | POA: Diagnosis not present

## 2023-10-17 DIAGNOSIS — K5900 Constipation, unspecified: Secondary | ICD-10-CM | POA: Diagnosis present

## 2023-10-17 DIAGNOSIS — M4310 Spondylolisthesis, site unspecified: Secondary | ICD-10-CM

## 2023-10-17 DIAGNOSIS — M545 Low back pain, unspecified: Secondary | ICD-10-CM | POA: Diagnosis not present

## 2023-10-17 DIAGNOSIS — J690 Pneumonitis due to inhalation of food and vomit: Secondary | ICD-10-CM | POA: Diagnosis not present

## 2023-10-17 DIAGNOSIS — M48061 Spinal stenosis, lumbar region without neurogenic claudication: Secondary | ICD-10-CM | POA: Diagnosis present

## 2023-10-17 DIAGNOSIS — Z885 Allergy status to narcotic agent status: Secondary | ICD-10-CM

## 2023-10-17 DIAGNOSIS — R339 Retention of urine, unspecified: Secondary | ICD-10-CM | POA: Diagnosis not present

## 2023-10-17 DIAGNOSIS — K219 Gastro-esophageal reflux disease without esophagitis: Secondary | ICD-10-CM | POA: Diagnosis present

## 2023-10-17 DIAGNOSIS — J8410 Pulmonary fibrosis, unspecified: Secondary | ICD-10-CM | POA: Diagnosis present

## 2023-10-17 DIAGNOSIS — Z818 Family history of other mental and behavioral disorders: Secondary | ICD-10-CM

## 2023-10-17 DIAGNOSIS — E66812 Obesity, class 2: Secondary | ICD-10-CM | POA: Diagnosis not present

## 2023-10-17 DIAGNOSIS — G9600 Cerebrospinal fluid leak, unspecified: Secondary | ICD-10-CM | POA: Diagnosis not present

## 2023-10-17 DIAGNOSIS — Z880 Allergy status to penicillin: Secondary | ICD-10-CM

## 2023-10-17 DIAGNOSIS — Y838 Other surgical procedures as the cause of abnormal reaction of the patient, or of later complication, without mention of misadventure at the time of the procedure: Secondary | ICD-10-CM | POA: Diagnosis not present

## 2023-10-17 DIAGNOSIS — Z888 Allergy status to other drugs, medicaments and biological substances status: Secondary | ICD-10-CM

## 2023-10-17 DIAGNOSIS — Z981 Arthrodesis status: Principal | ICD-10-CM

## 2023-10-17 DIAGNOSIS — Z90710 Acquired absence of both cervix and uterus: Secondary | ICD-10-CM

## 2023-10-17 DIAGNOSIS — N183 Chronic kidney disease, stage 3 unspecified: Secondary | ICD-10-CM | POA: Diagnosis present

## 2023-10-17 DIAGNOSIS — K2100 Gastro-esophageal reflux disease with esophagitis, without bleeding: Secondary | ICD-10-CM | POA: Diagnosis not present

## 2023-10-17 DIAGNOSIS — Z79899 Other long term (current) drug therapy: Secondary | ICD-10-CM

## 2023-10-17 DIAGNOSIS — I471 Supraventricular tachycardia, unspecified: Secondary | ICD-10-CM | POA: Diagnosis not present

## 2023-10-17 DIAGNOSIS — Z87820 Personal history of traumatic brain injury: Secondary | ICD-10-CM

## 2023-10-17 DIAGNOSIS — J9601 Acute respiratory failure with hypoxia: Secondary | ICD-10-CM | POA: Diagnosis present

## 2023-10-17 DIAGNOSIS — N3941 Urge incontinence: Secondary | ICD-10-CM | POA: Diagnosis present

## 2023-10-17 DIAGNOSIS — F419 Anxiety disorder, unspecified: Secondary | ICD-10-CM | POA: Diagnosis present

## 2023-10-17 DIAGNOSIS — Z0189 Encounter for other specified special examinations: Secondary | ICD-10-CM | POA: Diagnosis not present

## 2023-10-17 DIAGNOSIS — J95821 Acute postprocedural respiratory failure: Secondary | ICD-10-CM | POA: Diagnosis not present

## 2023-10-17 DIAGNOSIS — M532X6 Spinal instabilities, lumbar region: Secondary | ICD-10-CM | POA: Diagnosis not present

## 2023-10-17 DIAGNOSIS — M48062 Spinal stenosis, lumbar region with neurogenic claudication: Secondary | ICD-10-CM

## 2023-10-17 DIAGNOSIS — M47816 Spondylosis without myelopathy or radiculopathy, lumbar region: Secondary | ICD-10-CM | POA: Diagnosis present

## 2023-10-17 DIAGNOSIS — R0602 Shortness of breath: Secondary | ICD-10-CM | POA: Diagnosis not present

## 2023-10-17 DIAGNOSIS — Z8261 Family history of arthritis: Secondary | ICD-10-CM

## 2023-10-17 DIAGNOSIS — Z882 Allergy status to sulfonamides status: Secondary | ICD-10-CM

## 2023-10-17 DIAGNOSIS — Z886 Allergy status to analgesic agent status: Secondary | ICD-10-CM

## 2023-10-17 DIAGNOSIS — Z833 Family history of diabetes mellitus: Secondary | ICD-10-CM

## 2023-10-17 DIAGNOSIS — F909 Attention-deficit hyperactivity disorder, unspecified type: Secondary | ICD-10-CM | POA: Diagnosis present

## 2023-10-17 HISTORY — PX: APPLICATION OF INTRAOPERATIVE CT SCAN: SHX6668

## 2023-10-17 HISTORY — PX: TRANSFORAMINAL LUMBAR INTERBODY FUSION W/ MIS 1 LEVEL: SHX6145

## 2023-10-17 LAB — ABO/RH: ABO/RH(D): O POS

## 2023-10-17 LAB — GLUCOSE, CAPILLARY: Glucose-Capillary: 182 mg/dL — ABNORMAL HIGH (ref 70–99)

## 2023-10-17 SURGERY — MINIMALLY INVASIVE (MIS) TRANSFORAMINAL LUMBAR INTERBODY FUSION (TLIF) 1 LEVEL
Anesthesia: General | Site: Spine Lumbar

## 2023-10-17 MED ORDER — ALBUTEROL SULFATE (2.5 MG/3ML) 0.083% IN NEBU: 2.5000 mg | INHALATION_SOLUTION | Freq: Four times a day (QID) | RESPIRATORY_TRACT | Status: DC | PRN

## 2023-10-17 MED ORDER — SUCCINYLCHOLINE CHLORIDE 200 MG/10ML IV SOSY
PREFILLED_SYRINGE | INTRAVENOUS | Status: AC
  Filled 2023-10-17: qty 10

## 2023-10-17 MED ORDER — ONDANSETRON HCL 4 MG PO TABS
4.0000 mg | ORAL_TABLET | Freq: Four times a day (QID) | ORAL | Status: DC | PRN
  Administered 2023-10-20 – 2023-10-21 (×3): 4 mg via ORAL
  Filled 2023-10-17 (×2): qty 1

## 2023-10-17 MED ORDER — HYDROMORPHONE HCL 1 MG/ML IJ SOLN
0.5000 mg | INTRAMUSCULAR | Status: DC | PRN
  Administered 2023-10-17 – 2023-10-18 (×4): 0.5 mg via INTRAVENOUS
  Filled 2023-10-17 (×4): qty 1

## 2023-10-17 MED ORDER — KETOROLAC TROMETHAMINE 15 MG/ML IJ SOLN
7.5000 mg | Freq: Four times a day (QID) | INTRAMUSCULAR | Status: AC
  Administered 2023-10-17 – 2023-10-18 (×4): 7.5 mg via INTRAVENOUS
  Filled 2023-10-17 (×4): qty 1

## 2023-10-17 MED ORDER — BACLOFEN 10 MG PO TABS
10.0000 mg | ORAL_TABLET | Freq: Three times a day (TID) | ORAL | Status: DC
  Administered 2023-10-17 – 2023-10-22 (×14): 10 mg via ORAL
  Filled 2023-10-17 (×17): qty 1

## 2023-10-17 MED ORDER — EPHEDRINE 5 MG/ML INJ
INTRAVENOUS | Status: AC
  Filled 2023-10-17: qty 5

## 2023-10-17 MED ORDER — DEXAMETHASONE SODIUM PHOSPHATE 10 MG/ML IJ SOLN
INTRAMUSCULAR | Status: DC | PRN
  Administered 2023-10-17: 10 mg via INTRAVENOUS

## 2023-10-17 MED ORDER — LIDOCAINE HCL (CARDIAC) PF 100 MG/5ML IV SOSY
PREFILLED_SYRINGE | INTRAVENOUS | Status: DC | PRN
  Administered 2023-10-17: 100 mg via INTRAVENOUS

## 2023-10-17 MED ORDER — GABAPENTIN 400 MG PO CAPS
800.0000 mg | ORAL_CAPSULE | Freq: Three times a day (TID) | ORAL | Status: DC
  Administered 2023-10-17 – 2023-10-20 (×10): 800 mg via ORAL
  Filled 2023-10-17 (×10): qty 2

## 2023-10-17 MED ORDER — VANCOMYCIN HCL IN DEXTROSE 1-5 GM/200ML-% IV SOLN
1000.0000 mg | Freq: Once | INTRAVENOUS | Status: AC
  Administered 2023-10-17: 1000 mg via INTRAVENOUS

## 2023-10-17 MED ORDER — PHENOL 1.4 % MT LIQD: 1.0000 | OROMUCOSAL | Status: DC | PRN

## 2023-10-17 MED ORDER — CHLORHEXIDINE GLUCONATE 0.12 % MT SOLN
15.0000 mL | Freq: Once | OROMUCOSAL | Status: AC
  Administered 2023-10-17: 15 mL via OROMUCOSAL

## 2023-10-17 MED ORDER — IRRISEPT - 450ML BOTTLE WITH 0.05% CHG IN STERILE WATER, USP 99.95% OPTIME
TOPICAL | Status: DC | PRN
  Administered 2023-10-17: 450 mL via TOPICAL

## 2023-10-17 MED ORDER — FENTANYL CITRATE (PF) 100 MCG/2ML IJ SOLN
INTRAMUSCULAR | Status: AC
  Filled 2023-10-17: qty 2

## 2023-10-17 MED ORDER — VANCOMYCIN HCL IN DEXTROSE 1-5 GM/200ML-% IV SOLN
INTRAVENOUS | Status: AC
  Filled 2023-10-17: qty 200

## 2023-10-17 MED ORDER — DEXAMETHASONE SODIUM PHOSPHATE 10 MG/ML IJ SOLN
INTRAMUSCULAR | Status: AC
  Filled 2023-10-17: qty 1

## 2023-10-17 MED ORDER — SODIUM CHLORIDE 0.9% FLUSH: 3.0000 mL | INTRAVENOUS | Status: DC | PRN

## 2023-10-17 MED ORDER — BUPIVACAINE LIPOSOME 1.3 % IJ SUSP
INTRAMUSCULAR | Status: AC
  Filled 2023-10-17: qty 20

## 2023-10-17 MED ORDER — OXYCODONE HCL 5 MG/5ML PO SOLN: 5.0000 mg | Freq: Once | ORAL | Status: DC | PRN

## 2023-10-17 MED ORDER — ALBUTEROL SULFATE (2.5 MG/3ML) 0.083% IN NEBU
INHALATION_SOLUTION | RESPIRATORY_TRACT | Status: AC
  Filled 2023-10-17: qty 3

## 2023-10-17 MED ORDER — NALOXEGOL OXALATE 25 MG PO TABS
25.0000 mg | ORAL_TABLET | Freq: Every day | ORAL | Status: DC | PRN
  Administered 2023-10-19 (×2): 25 mg via ORAL
  Filled 2023-10-17 (×3): qty 1

## 2023-10-17 MED ORDER — FLUTICASONE FUROATE-VILANTEROL 200-25 MCG/ACT IN AEPB: 1.0000 | INHALATION_SPRAY | Freq: Every day | RESPIRATORY_TRACT | Status: DC

## 2023-10-17 MED ORDER — OXYCODONE HCL 5 MG PO TABS
10.0000 mg | ORAL_TABLET | ORAL | Status: DC | PRN
  Administered 2023-10-17 – 2023-10-18 (×4): 10 mg via ORAL
  Filled 2023-10-17 (×3): qty 2

## 2023-10-17 MED ORDER — ORAL CARE MOUTH RINSE: 15.0000 mL | OROMUCOSAL | Status: DC | PRN

## 2023-10-17 MED ORDER — LIDOCAINE HCL (PF) 2 % IJ SOLN
INTRAMUSCULAR | Status: AC
  Filled 2023-10-17: qty 5

## 2023-10-17 MED ORDER — ONDANSETRON HCL 4 MG/2ML IJ SOLN
INTRAMUSCULAR | Status: AC
  Filled 2023-10-17: qty 2

## 2023-10-17 MED ORDER — LACTATED RINGERS IV SOLN: INTRAVENOUS | Status: DC

## 2023-10-17 MED ORDER — GLYCOPYRROLATE 0.2 MG/ML IJ SOLN
INTRAMUSCULAR | Status: DC | PRN
  Administered 2023-10-17: .2 mg via INTRAVENOUS

## 2023-10-17 MED ORDER — GLYCOPYRROLATE 0.2 MG/ML IJ SOLN
INTRAMUSCULAR | Status: AC
  Filled 2023-10-17: qty 1

## 2023-10-17 MED ORDER — TRAZODONE HCL 50 MG PO TABS
50.0000 mg | ORAL_TABLET | Freq: Every day | ORAL | Status: DC
  Administered 2023-10-17 – 2023-10-21 (×5): 50 mg via ORAL
  Filled 2023-10-17 (×5): qty 1

## 2023-10-17 MED ORDER — FLEET ENEMA RE ENEM
1.0000 | ENEMA | Freq: Once | RECTAL | Status: AC | PRN
  Administered 2023-10-20: 1 via RECTAL

## 2023-10-17 MED ORDER — ONDANSETRON HCL 4 MG/2ML IJ SOLN
INTRAMUSCULAR | Status: DC | PRN
  Administered 2023-10-17 (×2): 4 mg via INTRAVENOUS

## 2023-10-17 MED ORDER — DOCUSATE SODIUM 100 MG PO CAPS
100.0000 mg | ORAL_CAPSULE | Freq: Two times a day (BID) | ORAL | Status: DC
  Administered 2023-10-17 – 2023-10-22 (×10): 100 mg via ORAL
  Filled 2023-10-17 (×10): qty 1

## 2023-10-17 MED ORDER — CHLORHEXIDINE GLUCONATE CLOTH 2 % EX PADS
6.0000 | MEDICATED_PAD | Freq: Every day | CUTANEOUS | Status: DC
  Administered 2023-10-19 – 2023-10-22 (×5): 6 via TOPICAL

## 2023-10-17 MED ORDER — MENTHOL 3 MG MT LOZG: 1.0000 | LOZENGE | OROMUCOSAL | Status: DC | PRN

## 2023-10-17 MED ORDER — BUPIVACAINE-EPINEPHRINE (PF) 0.5% -1:200000 IJ SOLN
INTRAMUSCULAR | Status: DC | PRN
  Administered 2023-10-17: 10 mL

## 2023-10-17 MED ORDER — PROPOFOL 10 MG/ML IV BOLUS
INTRAVENOUS | Status: AC
  Filled 2023-10-17: qty 20

## 2023-10-17 MED ORDER — SUCCINYLCHOLINE CHLORIDE 200 MG/10ML IV SOSY
PREFILLED_SYRINGE | INTRAVENOUS | Status: DC | PRN
  Administered 2023-10-17: 100 mg via INTRAVENOUS

## 2023-10-17 MED ORDER — OXYCODONE HCL 5 MG PO TABS
5.0000 mg | ORAL_TABLET | ORAL | Status: DC | PRN
  Filled 2023-10-17: qty 1

## 2023-10-17 MED ORDER — ALBUTEROL SULFATE (2.5 MG/3ML) 0.083% IN NEBU
2.5000 mg | INHALATION_SOLUTION | Freq: Once | RESPIRATORY_TRACT | Status: AC
  Administered 2023-10-17: 2.5 mg via RESPIRATORY_TRACT

## 2023-10-17 MED ORDER — POTASSIUM CHLORIDE IN NACL 20-0.9 MEQ/L-% IV SOLN
INTRAVENOUS | Status: DC
  Filled 2023-10-17 (×2): qty 1000

## 2023-10-17 MED ORDER — BUPIVACAINE HCL (PF) 0.5 % IJ SOLN
INTRAMUSCULAR | Status: AC
  Filled 2023-10-17: qty 30

## 2023-10-17 MED ORDER — POLYETHYLENE GLYCOL 3350 17 G PO PACK
17.0000 g | PACK | Freq: Every day | ORAL | Status: DC | PRN
  Administered 2023-10-20: 17 g via ORAL
  Filled 2023-10-17: qty 1

## 2023-10-17 MED ORDER — FIBRIN SEALANT 2 ML SINGLE DOSE KIT
PACK | CUTANEOUS | Status: DC | PRN
  Administered 2023-10-17: 2 mL via TOPICAL

## 2023-10-17 MED ORDER — ORAL CARE MOUTH RINSE: 15.0000 mL | Freq: Once | OROMUCOSAL | Status: AC

## 2023-10-17 MED ORDER — PHENYLEPHRINE HCL-NACL 20-0.9 MG/250ML-% IV SOLN
INTRAVENOUS | Status: DC | PRN
  Administered 2023-10-17: 80 ug/min via INTRAVENOUS

## 2023-10-17 MED ORDER — VITAMIN D 25 MCG (1000 UNIT) PO TABS
1000.0000 [IU] | ORAL_TABLET | Freq: Every day | ORAL | Status: DC
  Administered 2023-10-18 – 2023-10-22 (×5): 1000 [IU] via ORAL
  Filled 2023-10-17 (×5): qty 1

## 2023-10-17 MED ORDER — DEXMEDETOMIDINE HCL IN NACL 80 MCG/20ML IV SOLN
INTRAVENOUS | Status: DC | PRN
  Administered 2023-10-17: 8 ug via INTRAVENOUS

## 2023-10-17 MED ORDER — SODIUM CHLORIDE (PF) 0.9 % IJ SOLN
INTRAMUSCULAR | Status: AC
  Filled 2023-10-17: qty 20

## 2023-10-17 MED ORDER — PHENYLEPHRINE HCL-NACL 20-0.9 MG/250ML-% IV SOLN
INTRAVENOUS | Status: AC
  Filled 2023-10-17: qty 250

## 2023-10-17 MED ORDER — BUPIVACAINE-EPINEPHRINE (PF) 0.5% -1:200000 IJ SOLN
INTRAMUSCULAR | Status: AC
  Filled 2023-10-17: qty 10

## 2023-10-17 MED ORDER — FENTANYL CITRATE (PF) 100 MCG/2ML IJ SOLN
25.0000 ug | INTRAMUSCULAR | Status: DC | PRN
  Administered 2023-10-17 (×2): 25 ug via INTRAVENOUS

## 2023-10-17 MED ORDER — PROPOFOL 10 MG/ML IV BOLUS
INTRAVENOUS | Status: DC | PRN
  Administered 2023-10-17: 150 mg via INTRAVENOUS

## 2023-10-17 MED ORDER — MIDAZOLAM HCL 2 MG/2ML IJ SOLN
INTRAMUSCULAR | Status: AC
  Filled 2023-10-17: qty 2

## 2023-10-17 MED ORDER — POLYVINYL ALCOHOL 1.4 % OP SOLN: 1.0000 [drp] | Freq: Every day | OPHTHALMIC | Status: DC | PRN

## 2023-10-17 MED ORDER — MIDAZOLAM HCL 2 MG/2ML IJ SOLN
INTRAMUSCULAR | Status: DC | PRN
  Administered 2023-10-17: 2 mg via INTRAVENOUS

## 2023-10-17 MED ORDER — ONDANSETRON HCL 4 MG PO TABS: 4.0000 mg | ORAL_TABLET | Freq: Three times a day (TID) | ORAL | Status: DC | PRN

## 2023-10-17 MED ORDER — FENTANYL CITRATE (PF) 100 MCG/2ML IJ SOLN
INTRAMUSCULAR | Status: DC | PRN
  Administered 2023-10-17 (×2): 50 ug via INTRAVENOUS

## 2023-10-17 MED ORDER — PANTOPRAZOLE SODIUM 40 MG PO TBEC
40.0000 mg | DELAYED_RELEASE_TABLET | Freq: Every day | ORAL | Status: DC
  Administered 2023-10-18 – 2023-10-22 (×5): 40 mg via ORAL
  Filled 2023-10-17 (×5): qty 1

## 2023-10-17 MED ORDER — FLUTICASONE FUROATE-VILANTEROL 200-25 MCG/ACT IN AEPB
1.0000 | INHALATION_SPRAY | Freq: Every day | RESPIRATORY_TRACT | Status: DC
  Administered 2023-10-17 – 2023-10-22 (×5): 1 via RESPIRATORY_TRACT
  Filled 2023-10-17: qty 28

## 2023-10-17 MED ORDER — HYDROMORPHONE HCL 1 MG/ML IJ SOLN
INTRAMUSCULAR | Status: DC | PRN
  Administered 2023-10-17 (×2): .5 mg via INTRAVENOUS

## 2023-10-17 MED ORDER — SODIUM CHLORIDE (PF) 0.9 % IJ SOLN: INTRAMUSCULAR | Status: DC | PRN

## 2023-10-17 MED ORDER — SURGIFLO WITH THROMBIN (HEMOSTATIC MATRIX KIT) OPTIME
TOPICAL | Status: DC | PRN
  Administered 2023-10-17: 1 via TOPICAL

## 2023-10-17 MED ORDER — CEFAZOLIN SODIUM-DEXTROSE 2-4 GM/100ML-% IV SOLN
INTRAVENOUS | Status: AC
  Filled 2023-10-17: qty 100

## 2023-10-17 MED ORDER — OXYCODONE HCL 5 MG PO TABS: 5.0000 mg | ORAL_TABLET | Freq: Once | ORAL | Status: DC | PRN

## 2023-10-17 MED ORDER — DROPERIDOL 2.5 MG/ML IJ SOLN
0.6250 mg | Freq: Once | INTRAMUSCULAR | Status: AC
  Administered 2023-10-17: 0.625 mg via INTRAVENOUS

## 2023-10-17 MED ORDER — OXYCODONE HCL 5 MG PO TABS
10.0000 mg | ORAL_TABLET | Freq: Three times a day (TID) | ORAL | Status: DC
  Administered 2023-10-17 – 2023-10-18 (×4): 10 mg via ORAL
  Filled 2023-10-17 (×5): qty 2

## 2023-10-17 MED ORDER — ENOXAPARIN SODIUM 40 MG/0.4ML IJ SOSY
40.0000 mg | PREFILLED_SYRINGE | INTRAMUSCULAR | Status: DC
  Administered 2023-10-18 – 2023-10-22 (×5): 40 mg via SUBCUTANEOUS
  Filled 2023-10-17 (×5): qty 0.4

## 2023-10-17 MED ORDER — EPHEDRINE SULFATE-NACL 50-0.9 MG/10ML-% IV SOSY
PREFILLED_SYRINGE | INTRAVENOUS | Status: DC | PRN
  Administered 2023-10-17: 5 mg via INTRAVENOUS
  Administered 2023-10-17 (×2): 10 mg via INTRAVENOUS

## 2023-10-17 MED ORDER — IPRATROPIUM-ALBUTEROL 0.5-2.5 (3) MG/3ML IN SOLN: 3.0000 mL | Freq: Three times a day (TID) | RESPIRATORY_TRACT | Status: DC | PRN

## 2023-10-17 MED ORDER — FIBRIN SEALANT 2 ML SINGLE DOSE KIT
PACK | CUTANEOUS | Status: AC
  Filled 2023-10-17: qty 2

## 2023-10-17 MED ORDER — SODIUM CHLORIDE 0.9% FLUSH
3.0000 mL | Freq: Two times a day (BID) | INTRAVENOUS | Status: DC
  Administered 2023-10-17 – 2023-10-22 (×9): 3 mL via INTRAVENOUS

## 2023-10-17 MED ORDER — HYDROMORPHONE HCL 1 MG/ML IJ SOLN
INTRAMUSCULAR | Status: AC
Start: 2023-10-17 — End: ?
  Filled 2023-10-17: qty 1

## 2023-10-17 MED ORDER — PHENYLEPHRINE 80 MCG/ML (10ML) SYRINGE FOR IV PUSH (FOR BLOOD PRESSURE SUPPORT)
PREFILLED_SYRINGE | INTRAVENOUS | Status: DC | PRN
  Administered 2023-10-17 (×2): 160 ug via INTRAVENOUS

## 2023-10-17 MED ORDER — ONDANSETRON HCL 4 MG/2ML IJ SOLN
4.0000 mg | Freq: Four times a day (QID) | INTRAMUSCULAR | Status: DC | PRN
  Administered 2023-10-17 – 2023-10-22 (×6): 4 mg via INTRAVENOUS
  Filled 2023-10-17 (×6): qty 2

## 2023-10-17 MED ORDER — 0.9 % SODIUM CHLORIDE (POUR BTL) OPTIME
TOPICAL | Status: DC | PRN
  Administered 2023-10-17: 1000 mL

## 2023-10-17 MED ORDER — DULOXETINE HCL 30 MG PO CPEP
30.0000 mg | ORAL_CAPSULE | Freq: Two times a day (BID) | ORAL | Status: DC
  Administered 2023-10-17 – 2023-10-22 (×10): 30 mg via ORAL
  Filled 2023-10-17 (×10): qty 1

## 2023-10-17 MED ORDER — CEFAZOLIN IN SODIUM CHLORIDE 2-0.9 GM/100ML-% IV SOLN
2.0000 g | Freq: Once | INTRAVENOUS | Status: AC
  Administered 2023-10-17: 2 g via INTRAVENOUS

## 2023-10-17 MED ORDER — DROPERIDOL 2.5 MG/ML IJ SOLN
INTRAMUSCULAR | Status: AC
  Filled 2023-10-17: qty 2

## 2023-10-17 MED ORDER — CHLORHEXIDINE GLUCONATE 0.12 % MT SOLN
OROMUCOSAL | Status: AC
  Filled 2023-10-17: qty 15

## 2023-10-17 MED ORDER — SODIUM CHLORIDE 0.9 % IV SOLN: 250.0000 mL | INTRAVENOUS | Status: AC

## 2023-10-17 MED ORDER — ADULT MULTIVITAMIN W/MINERALS CH
1.0000 | ORAL_TABLET | Freq: Every day | ORAL | Status: DC
Start: 2023-10-18 — End: 2023-10-22
  Administered 2023-10-18 – 2023-10-22 (×5): 1 via ORAL
  Filled 2023-10-17 (×5): qty 1

## 2023-10-17 SURGICAL SUPPLY — 53 items
ALLOGRAFT BONE FIBER KORE 5 (Bone Implant) IMPLANT
BASIN KIT SINGLE STR (MISCELLANEOUS) ×2 IMPLANT
BUR NEURO DRILL SOFT 3.0X3.8M (BURR) ×2 IMPLANT
CATH LUMBAR HERMETIC 14G (CATHETERS) IMPLANT
CATH TRAUMA LG BORE WO ANTIVAL (MISCELLANEOUS) IMPLANT
COVERAGE SUPP BRAINLAB NG SPNE (MISCELLANEOUS) ×2 IMPLANT
DERMABOND ADVANCED .7 DNX12 (GAUZE/BANDAGES/DRESSINGS) ×2 IMPLANT
DRAPE LAPAROTOMY 100X77 ABD (DRAPES) ×2 IMPLANT
DRAPE MICROSCOPE SPINE 48X150 (DRAPES) IMPLANT
DRAPE SCAN PATIENT (DRAPES) ×2 IMPLANT
DRSG OPSITE POSTOP 4X8 (GAUZE/BANDAGES/DRESSINGS) IMPLANT
DRSG TEGADERM 4X4.75 (GAUZE/BANDAGES/DRESSINGS) IMPLANT
DRSG TEGADERM 6X8 (GAUZE/BANDAGES/DRESSINGS) IMPLANT
ELECTRODE EZSTD 165MM 6.5IN (MISCELLANEOUS) IMPLANT
ELECTRODE REM PT RTRN 9FT ADLT (ELECTROSURGICAL) ×2 IMPLANT
EVACUATOR 1/8 PVC DRAIN (DRAIN) IMPLANT
EX-PIN ORTHOLOCK NAV 4X150 (PIN) IMPLANT
FEE CVG SUPP BRAINLAB NG SPNE (MISCELLANEOUS) IMPLANT
GAUZE 4X4 16PLY ~~LOC~~+RFID DBL (SPONGE) IMPLANT
GAUZE SPONGE 2X2 STRL 8-PLY (GAUZE/BANDAGES/DRESSINGS) IMPLANT
GLOVE BIOGEL PI IND STRL 6.5 (GLOVE) ×4 IMPLANT
GLOVE SURG SYN 6.5 ES PF (GLOVE) ×4 IMPLANT
GLOVE SURG SYN 6.5 PF PI (GLOVE) ×4 IMPLANT
GLOVE SURG SYN 8.5 E (GLOVE) ×8 IMPLANT
GLOVE SURG SYN 8.5 PF PI (GLOVE) ×8 IMPLANT
GOWN SRG LRG LVL 4 IMPRV REINF (GOWNS) ×6 IMPLANT
GOWN SRG XL LVL 3 NONREINFORCE (GOWNS) ×2 IMPLANT
GRAFT DURAL DURAMATRIX 1X1 3.5 (Neurosurgery Supplies) IMPLANT
GUIDEWIRE NITINOL BEVEL TIP (WIRE) IMPLANT
HOLDER FOLEY CATH W/STRAP (MISCELLANEOUS) IMPLANT
INTERBODY SABLE 10X26 7-14 15D (Miscellaneous) IMPLANT
KIT SPINAL PRONEVIEW (KITS) ×2 IMPLANT
LAVAGE JET IRRISEPT WOUND (IRRIGATION / IRRIGATOR) ×2 IMPLANT
MANIFOLD NEPTUNE II (INSTRUMENTS) ×2 IMPLANT
MARKER SPHERE PSV REFLC 13MM (MARKER) ×14 IMPLANT
NDL SAFETY ECLIPSE 18X1.5 (NEEDLE) ×2 IMPLANT
NS IRRIG 500ML POUR BTL (IV SOLUTION) ×2 IMPLANT
PACK LAMINECTOMY ARMC (PACKS) ×2 IMPLANT
ROD RELINE MAS TI LORD 5.5X40 (Rod) IMPLANT
SCREW LOCK RELINE 5.5 TULIP (Screw) IMPLANT
SCREW RELINE RED 6.5X45MM POLY (Screw) IMPLANT
STAPLER SKIN PROX 35W (STAPLE) IMPLANT
STRIP CLOSURE SKIN 1/2X4 (GAUZE/BANDAGES/DRESSINGS) IMPLANT
SURGIFLO W/THROMBIN 8M KIT (HEMOSTASIS) ×2 IMPLANT
SUT STRATA 3-0 15 PS-2 (SUTURE) ×2 IMPLANT
SUT VIC AB 0 CT1 27XCR 8 STRN (SUTURE) ×4 IMPLANT
SUT VIC AB 2-0 CT1 18 (SUTURE) ×4 IMPLANT
SUTURE EHLN 3-0 FS-10 30 BLK (SUTURE) IMPLANT
SUTURE MNCRL 4-0 27XMF (SUTURE) IMPLANT
SYR 30ML LL (SYRINGE) ×4 IMPLANT
TOWEL OR 17X26 4PK STRL BLUE (TOWEL DISPOSABLE) ×2 IMPLANT
TRAP FLUID SMOKE EVACUATOR (MISCELLANEOUS) ×2 IMPLANT
TRAY FOLEY SLVR 16FR LF STAT (SET/KITS/TRAYS/PACK) IMPLANT

## 2023-10-17 NOTE — Transfer of Care (Signed)
 Immediate Anesthesia Transfer of Care Note  Patient: Victoria [name]  Procedure(s) Performed: L4-5 MINIMALLY INVASIVE (MIS) TRANSFORAMINAL LUMBAR INTERBODY FUSION (TLIF) APPLICATION OF INTRAOPERATIVE CT SCAN (Spine Lumbar)  Patient Location: PACU  Anesthesia Type:General  Level of Consciousness: drowsy  Airway & Oxygen Therapy: Patient Spontanous Breathing and Patient connected to face mask oxygen  Post-op Assessment: Report given to RN and Post -op Vital signs reviewed and stable  Post vital signs: Reviewed and stable  Last Vitals:  Vitals Value Taken Time  BP 112/65 10/17/23 1630  Temp 35.9 1630  Pulse 82 10/17/23 1634  Resp 17 10/17/23 1634  SpO2 92 % 10/17/23 1624  Vitals shown include unfiled device data.  Last Pain:  Vitals:   10/17/23 0936  TempSrc: Temporal  PainSc: 4          Complications: No notable events documented.

## 2023-10-17 NOTE — Discharge Instructions (Addendum)
 Your surgeon has performed an operation on your lumbar spine (low back) to relieve pressure on one or more nerves. Many times, patients feel better immediately after surgery and can "overdo it." Even if you feel well, it is important that you follow these activity guidelines. If you do not let your back heal properly from the surgery, you can increase the chance of hardware complications and/or return of your symptoms. The following are instructions to help in your recovery once you have been discharged from the hospital.  Do not use NSAIDs for 3 months after surgery.  *Regarding compression stockings-  Please wear day and night until you are walking a couple hundred feet three times a day.   Activity    No bending, lifting, or twisting ("BLT"). Avoid lifting objects heavier than 10 pounds (gallon milk jug).  Where possible, avoid household activities that involve lifting, bending, pushing, or pulling such as laundry, vacuuming, grocery shopping, and childcare. Try to arrange for help from friends and family for these activities while your back heals.  Increase physical activity slowly as tolerated.  Taking short walks is encouraged, but avoid strenuous exercise. Do not jog, run, bicycle, lift weights, or participate in any other exercises unless specifically allowed by your doctor. Avoid prolonged sitting, including car rides.  Talk to your doctor before resuming sexual activity.  You should not drive until cleared by your doctor.  Until released by your doctor, you should not return to work or school.  You should rest at home and let your body heal.   You may shower three days after your surgery.  After showering, lightly dab your incision dry. Do not take a tub bath or go swimming for 3 weeks, or until approved by your doctor at your follow-up appointment.  If you smoke, we strongly recommend that you quit.  Smoking has been proven to interfere with normal healing in your back and will  dramatically reduce the success rate of your surgery. Please contact [facility] (800-QUIT-NOW) and use the resources at www.[facility].com for assistance in stopping smoking.  Surgical Incision   If you have a dressing on your incision, you may remove it three days after your surgery. Keep your incision area clean and dry.  Your incision was closed with Dermabond glue. The glue should begin to peel away within about a week.  Diet            You may return to your usual diet. Be sure to stay hydrated.  When to Contact Us   Although your surgery and recovery will likely be uneventful, you may have some residual numbness, aches, and pains in your back and/or legs. This is normal and should improve in the next few weeks.  However, should you experience any of the following, contact us  immediately: New numbness or weakness Pain that is progressively getting worse, and is not relieved by your pain medications or rest Bleeding, redness, swelling, pain, or drainage from surgical incision Chills or flu-like symptoms Fever greater than 101.0 F (38.3 C) Problems with bowel or bladder functions Difficulty breathing or shortness of breath Warmth, tenderness, or swelling in your calf  Contact Information How to contact us :  If you have any questions/concerns before or after surgery, you can reach us  at [phone], or you can send a mychart message. We can be reached by phone or mychart 8am-4pm, Monday-Friday.  *Please note: Calls after 4pm are forwarded to a third party answering service. Mychart messages are not routinely monitored during evenings,  weekends, and holidays. Please call our office to contact the answering service for urgent concerns during non-business hours.   [facility].  [address] , [place], [place], [address]. [phone] They will call you to arrange when they can come to see you

## 2023-10-17 NOTE — Interval H&P Note (Signed)
 History and Physical Interval Note:  10/17/2023 11:23 AM  Victoria [name]  has presented today for surgery, with the diagnosis of M43.16 Spondylolisthesis of lumbar regionM54.42, M54.41, G89.29 Chronic bilateral low back pain with bilateral sciaticaM53.2X6 Spinal instability, lumbar.  The various methods of treatment have been discussed with the patient and family. After consideration of risks, benefits and other options for treatment, the patient has consented to  Procedure(s) with comments: L4-5 MINIMALLY INVASIVE (MIS) TRANSFORAMINAL LUMBAR INTERBODY FUSION (TLIF) (N/A) - L4-5 MINIMALLY INVASIVE (MIS) TRANSFORAMINAL LUMBAR INTERBODY FUSION (TLIF) APPLICATION OF INTRAOPERATIVE CT SCAN (N/A) as a surgical intervention.  The patient's history has been reviewed, patient examined, no change in status, stable for surgery.  I have reviewed the patient's chart and labs.  Questions were answered to the patient's satisfaction.    Heart sounds normal no MRG. Chest Clear to Auscultation Bilaterally.   Kush Farabee

## 2023-10-17 NOTE — Op Note (Signed)
 Indications: [name] is suffering from M43.16 Spondylolisthesis of lumbar regionM54.42, M54.41, G89.29 Chronic bilateral low back pain with bilateral sciaticaM53.2X6 Spinal instability, lumbar . The patient tried and failed conservative management, prompting surgical intervention.  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   Preoperative Note:   Risks of surgery discussed include: infection, bleeding, stroke, coma, death, paralysis, CSF leak, nerve/spinal cord injury, numbness, tingling, weakness, complex regional pain syndrome, recurrent stenosis and/or disc herniation, vascular injury, development of instability, neck/back pain, need for further surgery, persistent symptoms, development of deformity, and the risks of anesthesia. The patient understood these risks and agreed to proceed.  Operative Note:  1. Transforaminal Lumbar Interbody Fusion L4/5 2. Posterolateral arthrodesis L4 to L5 3. Posterior nonsegmental instrumentation L4 to L5 using Nuvasive Reline 4. Use of stereotaxis ([employer]) 5. Harvesting of autograft via the same incision 6. Placement of a biomechanical device (Globus Sable) at L4/5 for anterior arthrodesis 7. Lumbar drain placement  The patient was brought to the Operating Room, intubated and turned into the prone position. All pressure points were checked and double checked. The patient was prepped and draped in the standard fashion. A full timeout was performed. Preoperative antibiotics were given.   After draping, the stereotactic array was placed.  Stereotactic imaging was acquired and  registered to the [facility] system.  The image guidance system was used to choose bilateral [place]ie incisions. The incisions were injected with local anesthetic.  Each incision was opened with a scalpel, bovie electrocautery was used to open the fascia.  Using stereotactic guidance, each pedicle from L4-L5was cannulated and K wire secured.  We then placed pedicle screws over each K wire on the contralateral side.  We placed the ipsilateral pedicle screws after the TLIF procedure was performed.  6.5x50mm Nuvasive Reline screws were used bilaterally at each level.   After placement of pedicle screws, we then turned our attention to transforaminal lumbar interbody fusion.  A tubular retractor was advanced over the right L4/5 facet. The microscope was brought into the field.  The right L4/5 facet was removed with the drill, and handed off for preparation as autograft. The traversing and exiting nerve roots on the right were identified and protected. During removal of the ligamentum, a small durotomy was encountered during the dissection of the calcified ligamentum flavum.  This was carefully protected during the remainder of the case.  The disc was opened using a scalpel. After incising the disc space, we took a combination of pituitary rongeurs, Kerrison rongeurs, disc scrapers, and curettes to remove a majority of the disc material.  We prepared the end plates for accepting the interbody fusion.  We removed the cartilaginous plate, preserved the cortical endplate if possible during this procedure.  The disc space was irrigated.  The Globus Sable biomechanical device was inserted, then backfilled with a mixture of allograft and autograft. During placement, the nerve roots and dural sac were carefully protected without any leaks identified. After placement of the device, the canal was fully inspected and all nerve roots were checked to ensure full decompression.    I then inspected the durotomy.  It was a small  approximately 2 to 3 mm durotomy.  The nerve roots herniating  through the defect were located back into the thecal sac.  Because of the displaced nerve roots, I felt that it was important to attempt direct closure.  Using 5-0 Gore-Tex suture, 2 interrupted sutures were placed to reapproximate the dura with a fat bolster.  Because of the quality of the dura, there was leakage of spinal fluid from the suture holes and places where the dura had difficulty holding the suture.  Due to this, I made the intraoperative decision to place a lumbar drain at the end of the case.  DuraGen and Vistaseal  were used to augment the closure.  The retractor was removed.  Rods measured and placed. The rods were secured using locking caps to manufacturer's specifications. CT scan was taken to confirm instrumentation placement. The wound was copiously irrigated, then the posterior elements were prepared for arthrodesis.  A mixture of allograft and autograft was placed over the decorticated surfaces for arthrodesis.    After hemostasis, the wound was closed in layers with 0 and 2-0 vicryl. 3-0 monocryl and dermabond was applied to the incision on the left.  The right side skin was closed with 3-0 nylon.. A sterile dressing was placed.  The L3-4 interspace was located.  A 14-gauge Touhy needle was introduced through the skin into the thecal space.  Brisk flow of spinal fluid was noted.  The catheter was then introduced and advanced to 15 cm.  The Touhy needle was removed.  The catheter was then secured to the connector using the suture.  It was connected to the collection chamber and flow was confirmed.  A sterile dressing was then applied.  The patient was then flipped supine and extubated with incident. All counts were correct times 2 at the end of the case. No immediate complications were noted.  I performed the procedure.   Jodeen Munch MD

## 2023-10-17 NOTE — Anesthesia Postprocedure Evaluation (Signed)
 Anesthesia Post Note  Patient: Victoria [name]  Procedure(s) Performed: L4-5 MINIMALLY INVASIVE (MIS) TRANSFORAMINAL LUMBAR INTERBODY FUSION (TLIF) APPLICATION OF INTRAOPERATIVE CT SCAN (Spine Lumbar)  Patient location during evaluation: PACU Anesthesia Type: General Level of consciousness: awake and alert, oriented and patient cooperative Pain management: pain level controlled Vital Signs Assessment: post-procedure vital signs reviewed and stable Respiratory status: spontaneous breathing, nonlabored ventilation and respiratory function stable Cardiovascular status: blood pressure returned to baseline and stable Postop Assessment: adequate PO intake Anesthetic complications: no   No notable events documented.   Last Vitals:  Vitals:   10/17/23 1736 10/17/23 1748  BP: (!) 142/87   Pulse: 93 89  Resp: (!) 22 20  Temp:  (!) 36.1 C  SpO2: 97% 93%    Last Pain:  Vitals:   10/17/23 1758  TempSrc:   PainSc: 8                  Elisha Cooksey

## 2023-10-17 NOTE — Anesthesia Procedure Notes (Signed)
 Procedure Name: Intubation Date/Time: 10/17/2023 12:05 PM  Performed by: Ellwood Haber, CRNAPre-anesthesia Checklist: Patient identified, Emergency Drugs available, Suction available and Patient being monitored Patient Re-evaluated:Patient Re-evaluated prior to induction Oxygen Delivery Method: Circle system utilized Preoxygenation: Pre-oxygenation with 100% oxygen Induction Type: IV induction Ventilation: Mask ventilation without difficulty Laryngoscope Size: McGrath and 3 Grade View: Grade I Tube type: Oral Tube size: 7.0 mm Number of attempts: 1 Airway Equipment and Method: Stylet and Oral airway Placement Confirmation: ETT inserted through vocal cords under direct vision, positive ETCO2 and breath sounds checked- equal and bilateral Secured at: 22 cm Tube secured with: Tape Dental Injury: Teeth and Oropharynx as per pre-operative assessment  Comments: Cords clear. CA

## 2023-10-17 NOTE — Plan of Care (Signed)

## 2023-10-17 NOTE — Anesthesia Preprocedure Evaluation (Addendum)
 Anesthesia Evaluation  Patient identified by MRN, date of birth, ID band Patient awake    Reviewed: Allergy & Precautions, NPO status , Patient's Chart, lab work & pertinent test results  Airway Mallampati: III  TM Distance: >3 FB Neck ROM: full    Dental  (+) Chipped, Dental Advidsory Given   Pulmonary asthma , sleep apnea , former smoker   Pulmonary exam normal        Cardiovascular hypertension, (-) angina Normal cardiovascular exam     Neuro/Psych  PSYCHIATRIC DISORDERS  Depression Bipolar Disorder    Neuromuscular disease    GI/Hepatic Neg liver ROS,GERD  Medicated,,  Endo/Other  negative endocrine ROS    Renal/GU      Musculoskeletal   Abdominal   Peds  Hematology negative hematology ROS (+)   Anesthesia Other Findings Past Medical History: No date: Asthma No date: Cervical spinal stenosis No date: Complication of anesthesia No date: Dyspnea No date: Dysrhythmia No date: Fatty liver No date: GERD (gastroesophageal reflux disease) No date: Hypercholesterolemia No date: Hypertension No date: Lumbar stenosis No date: Lyme disease No date: Melanocarcinoma (HCC) No date: Osteoarthritis of both hips No date: Pre-diabetes No date: Psychotic disorder (HCC) No date: Pulmonary fibrosis (HCC) No date: Sleep apnea  Past Surgical History: No date: ABDOMINAL HYSTERECTOMY 2006: APPENDECTOMY 1997: BREAST EXCISIONAL BIOPSY; Right     Comment:  neg 09/20/2022: ESOPHAGOGASTRODUODENOSCOPY (EGD) WITH PROPOFOL ; N/A     Comment:  Procedure: ESOPHAGOGASTRODUODENOSCOPY (EGD) WITH               PROPOFOL ;  Surgeon: Luke Salaam, MD;  Location: [facility]               ENDOSCOPY;  Service: Gastroenterology;  Laterality: N/A; No date: excision of melanoma on Right arm No date: HIATAL HERNIA REPAIR     Comment:  Jan 2017 2020: LAPAROSCOPY  BMI    Body Mass Index: 34.81 kg/m      Reproductive/Obstetrics negative OB ROS                              Anesthesia Physical Anesthesia Plan  ASA: 3  Anesthesia Plan: General ETT   Post-op Pain Management:    Induction: Intravenous  PONV Risk Score and Plan: Ondansetron , Dexamethasone  and Midazolam   Airway Management Planned: Oral ETT  Additional Equipment:   Intra-op Plan:   Post-operative Plan: Extubation in OR  Informed Consent: I have reviewed the patients History and Physical, chart, labs and discussed the procedure including the risks, benefits and alternatives for the proposed anesthesia with the patient or authorized representative who has indicated his/her understanding and acceptance.     Dental Advisory Given  Plan Discussed with: Anesthesiologist, CRNA and Surgeon  Anesthesia Plan Comments: (Patient consented for risks of anesthesia including but not limited to:  - adverse reactions to medications - damage to eyes, teeth, lips or other oral mucosa - nerve damage due to positioning  - sore throat or hoarseness - Damage to heart, brain, nerves, lungs, other parts of body or loss of life  Patient voiced understanding and assent.)       Anesthesia Quick Evaluation

## 2023-10-18 ENCOUNTER — Inpatient Hospital Stay

## 2023-10-18 ENCOUNTER — Encounter: Payer: Self-pay | Admitting: Neurosurgery

## 2023-10-18 ENCOUNTER — Ambulatory Visit: Payer: Self-pay | Admitting: Family Medicine

## 2023-10-18 DIAGNOSIS — M532X6 Spinal instabilities, lumbar region: Secondary | ICD-10-CM | POA: Diagnosis not present

## 2023-10-18 DIAGNOSIS — R0602 Shortness of breath: Secondary | ICD-10-CM | POA: Diagnosis not present

## 2023-10-18 DIAGNOSIS — K2100 Gastro-esophageal reflux disease with esophagitis, without bleeding: Secondary | ICD-10-CM

## 2023-10-18 DIAGNOSIS — J8410 Pulmonary fibrosis, unspecified: Secondary | ICD-10-CM

## 2023-10-18 DIAGNOSIS — F312 Bipolar disorder, current episode manic severe with psychotic features: Secondary | ICD-10-CM

## 2023-10-18 DIAGNOSIS — M5442 Lumbago with sciatica, left side: Secondary | ICD-10-CM | POA: Diagnosis not present

## 2023-10-18 DIAGNOSIS — G9782 Other postprocedural complications and disorders of nervous system: Secondary | ICD-10-CM | POA: Diagnosis not present

## 2023-10-18 DIAGNOSIS — I10 Essential (primary) hypertension: Secondary | ICD-10-CM

## 2023-10-18 DIAGNOSIS — G9600 Cerebrospinal fluid leak, unspecified: Secondary | ICD-10-CM

## 2023-10-18 DIAGNOSIS — M4310 Spondylolisthesis, site unspecified: Secondary | ICD-10-CM

## 2023-10-18 DIAGNOSIS — J9601 Acute respiratory failure with hypoxia: Secondary | ICD-10-CM | POA: Diagnosis present

## 2023-10-18 DIAGNOSIS — E669 Obesity, unspecified: Secondary | ICD-10-CM | POA: Diagnosis present

## 2023-10-18 LAB — CBC
HCT: 38 % (ref 36.0–46.0)
Hemoglobin: 12.5 g/dL (ref 12.0–15.0)
MCH: 30.9 pg (ref 26.0–34.0)
MCHC: 32.9 g/dL (ref 30.0–36.0)
MCV: 93.8 fL (ref 80.0–100.0)
Platelets: 184 10*3/uL (ref 150–400)
RBC: 4.05 MIL/uL (ref 3.87–5.11)
RDW: 14.6 % (ref 11.5–15.5)
WBC: 16.4 10*3/uL — ABNORMAL HIGH (ref 4.0–10.5)
nRBC: 0 % (ref 0.0–0.2)

## 2023-10-18 LAB — BASIC METABOLIC PANEL WITH GFR
Anion gap: 11 (ref 5–15)
BUN: 12 mg/dL (ref 8–23)
CO2: 25 mmol/L (ref 22–32)
Calcium: 8.6 mg/dL — ABNORMAL LOW (ref 8.9–10.3)
Chloride: 102 mmol/L (ref 98–111)
Creatinine, Ser: 0.86 mg/dL (ref 0.44–1.00)
GFR, Estimated: >60 mL/min (ref >60)
Glucose, Bld: 167 mg/dL — ABNORMAL HIGH (ref 70–99)
Potassium: 4.4 mmol/L (ref 3.5–5.1)
Sodium: 138 mmol/L (ref 135–145)

## 2023-10-18 LAB — RESP PANEL BY RT-PCR (RSV, FLU A&B, COVID)  RVPGX2
Influenza A by PCR: NEGATIVE
Influenza B by PCR: NEGATIVE
Resp Syncytial Virus by PCR: NEGATIVE
SARS Coronavirus 2 by RT PCR: NEGATIVE

## 2023-10-18 LAB — BRAIN NATRIURETIC PEPTIDE: B Natriuretic Peptide: 274 pg/mL — ABNORMAL HIGH (ref 0.0–100.0)

## 2023-10-18 LAB — TROPONIN I (HIGH SENSITIVITY): Troponin I (High Sensitivity): 11 ng/L (ref <18)

## 2023-10-18 MED ORDER — OXYCODONE HCL 5 MG PO TABS
10.0000 mg | ORAL_TABLET | ORAL | Status: DC | PRN
  Administered 2023-10-19 – 2023-10-20 (×5): 10 mg via ORAL
  Filled 2023-10-18 (×5): qty 2

## 2023-10-18 MED ORDER — DIAZEPAM 5 MG PO TABS
5.0000 mg | ORAL_TABLET | Freq: Three times a day (TID) | ORAL | Status: DC | PRN
Start: 2023-10-18 — End: 2023-10-22
  Administered 2023-10-18 – 2023-10-19 (×2): 5 mg via ORAL
  Filled 2023-10-18 (×2): qty 1

## 2023-10-18 MED ORDER — IPRATROPIUM-ALBUTEROL 0.5-2.5 (3) MG/3ML IN SOLN
3.0000 mL | RESPIRATORY_TRACT | Status: DC
  Administered 2023-10-18 – 2023-10-19 (×3): 3 mL via RESPIRATORY_TRACT
  Filled 2023-10-18 (×3): qty 3

## 2023-10-18 MED ORDER — BUTALBITAL-APAP-CAFFEINE 50-325-40 MG PO TABS
1.0000 | ORAL_TABLET | Freq: Four times a day (QID) | ORAL | Status: DC | PRN
  Administered 2023-10-18 – 2023-10-22 (×3): 1 via ORAL
  Filled 2023-10-18 (×4): qty 1

## 2023-10-18 MED ORDER — ALBUTEROL SULFATE (2.5 MG/3ML) 0.083% IN NEBU: 2.5000 mg | INHALATION_SOLUTION | RESPIRATORY_TRACT | Status: DC | PRN

## 2023-10-18 MED ORDER — METHYLPREDNISOLONE SODIUM SUCC 125 MG IJ SOLR
125.0000 mg | Freq: Once | INTRAMUSCULAR | Status: AC
  Administered 2023-10-18: 125 mg via INTRAVENOUS
  Filled 2023-10-18: qty 2

## 2023-10-18 MED ORDER — HYDRALAZINE HCL 20 MG/ML IJ SOLN: 5.0000 mg | INTRAMUSCULAR | Status: DC | PRN

## 2023-10-18 MED ORDER — IOHEXOL 350 MG/ML SOLN
100.0000 mL | Freq: Once | INTRAVENOUS | Status: AC | PRN
  Administered 2023-10-18: 100 mL via INTRAVENOUS

## 2023-10-18 NOTE — Progress Notes (Addendum)
 Patient complained of headache and body aches. Not new symptoms.Refused pain medication at this time.

## 2023-10-18 NOTE — Progress Notes (Signed)
 Attending Progress Note  History: Victoria [name] is here for L4-5 minimally invasive transforaminal lumbar interbody fusion, they are currently 1 Day Post-Op.   POD1: She is having some anterior bilateral thigh soreness, significantly improved from overnight.  In nondermatomal distribution.  No new numbness weakness or tingling.  No headaches.  No issues with the drain site overnight.  Physical Exam: Vitals:   10/18/23 0500 10/18/23 0600  BP: 132/85 134/72  Pulse: 86 89  Resp: 14 14  Temp:    SpO2: 93% 94%   I/O last 3 completed shifts: In: 2289.8 [I.V.:2189.8; IV Piggyback:100] Out: 1491 [Urine:1150; Drains:141; Blood:200] No intake/output data recorded.  Output by Drain (mL) 10/16/23 0701 - 10/16/23 1900 10/16/23 1901 - 10/17/23 0700 10/17/23 0701 - 10/17/23 1900 10/17/23 1901 - 10/18/23 0700 10/18/23 0701 - 10/18/23 0723  Lumbar Drain   30 111     AA Ox3 CNI  Strength: Other than mild EHL weakness noted bilaterally, strength remains good bilaterally in the lower extremities.  Some guarding due to pain, but no clear new deficits.  No new sensory deficits.  Baseline neuropathy noted and sensory changes secondary to this.  Drain site is dry, connections have been checked without leakage.  Data:  No results for input(s): "NA", "K", "CL", "CO2", "BUN", "CREATININE", "LABGLOM", "GLUCOSE", "CALCIUM" in the last 168 hours. No results for input(s): "AST", "ALT", "ALKPHOS" in the last 168 hours.  Invalid input(s): "TBILI"   No results for input(s): "WBC", "HGB", "HCT", "PLT" in the last 168 hours. No results for input(s): "APTT", "INR" in the last 168 hours.      DG Lumbar Spine 2-3 Views Result Date: 10/17/2023 CLINICAL DATA:  Elective surgery. EXAM: LUMBAR SPINE - 2-3 VIEW COMPARISON:  None Available. FINDINGS: Seven fluoroscopic spot views of the lumbar spine submitted from the operating room. Sequential images during posterior fusion at L4-L5 with interbody spacer.  Fluoroscopy time 1 minutes 7 seconds. Dose 143.21 mGy IMPRESSION: Intraoperative fluoroscopy during posterior fusion at L4-L5. Electronically Signed   By: Chadwick Colonel M.D.   On: 10/17/2023 17:04   DG C-Arm 1-60 Min-No Report Result Date: 10/17/2023 Fluoroscopy was utilized by the requesting physician.  No radiographic interpretation.   DG C-Arm 1-60 Min-No Report Result Date: 10/17/2023 Fluoroscopy was utilized by the requesting physician.  No radiographic interpretation.   DG C-Arm 1-60 Min-No Report Result Date: 10/17/2023 Fluoroscopy was utilized by the requesting physician.  No radiographic interpretation.    Other tests/results: None  Assessment/Plan:  Victoria [name] is currently 1 Day Post-Op, from MIS TLIF.  Neuro: Pain control, Toradol , oxycodone , IV Dilaudid .  To remain flat for a another 24 hours.  Drain will remain in place. Cardiac: Per ICU Pulm: Incentive spirometry GI: Antiemetics, bowel regimen FEN: Saline lock when tolerating p.o. Endo: POC glucose Heme: Lovenox  for DVT prophylaxis ID: Perioperative antibiotics.  Lines drains tubes: Lumbar drain in place, 10 cc/h, clamp after hourly goal, contact neurosurgery if any drain site issues Dispo: Physical therapy occupational therapy, drain removal, pain control.  Carroll Clamp, MD/MSCR Department of Neurosurgery

## 2023-10-18 NOTE — Consult Note (Addendum)
 Medical Consultation   Victoria [name]  ZOX:[id number]  DOB: 1960/12/22  DOA: 10/17/2023  PCP: Rockney Cid, DO   Outpatient Specialists:    Requesting physician: - Dr. Jeris Montes of neurosurgery  Reason for consultation: -SOB   History of Present Illness: Victoria [name] is an 63 y.o. female with PMH of ILD, spondylolisthesis, chronic low back pain, hypertension, hyperlipidemia, prediabetes, GERD, bipolar, Lyme's disease, OSA not using CPAP, who is admitted by Dr. Jeris Montes of neurosurgery due to spondylolisthesis. Pt is s/p of surgery. We are asked to consult due to SOB.  Patient is s/p of L4-5 minimally invasive transforaminal lumbar interbody fusion, 1 Day Post-Op.  Patient states that she has worsening shortness breath, cough with yellow-colored sputum production.  Also has mild chest pain which is located in mild front chest, described as pressure and also sharp pain, pleuritic, aggravated by deep breath.  No fever or chills.  Patient is normally not using oxygen, but was found to have oxygen desaturation to 87% on room air, started 70s oxygen with 91-93% of saturation.  Patient is nausea, no vomiting and abdominal pain.  Patient is constipated.  Date reviewed, lab, image and vitals: WBC 16.4, GFR> 60, negative PCR for COVID, flu and RSV, troponin 11, BNP 274.  Temperature normal, blood pressure 166/66, heart rate of 104, RR 30.  CTA negative for PE, but showed some opacity   CTA: Posterior upper and lower lobe airspace opacities bilaterally, right greater than left. This could reflect dependent atelectasis or pneumonia. Trace bilateral pleural effusions. Fatty liver. Suspect hepatomegaly.    EKG: I reviewed EKG independently.  Sinus rhythm, QTc 427, Q-wave only in lead III.  Review of Systems:   General: no fevers, chills, no changes in body weight, no changes in appetite Skin: no rash HEENT: no blurry vision, hearing changes or sore throat Pulm:  has dyspnea, coughing, no wheezing CV: no chest pain, palpitations, shortness of breath Abd: no nausea/vomiting, abdominal pain, diarrhea/constipation GU: no dysuria, hematuria, polyuria Ext: no arthralgias, myalgias Neuro: no weakness, numbness, or tingling    Past Medical History: Past Medical History:  Diagnosis Date   Asthma    Cervical spinal stenosis    Complication of anesthesia    Dyspnea    Dysrhythmia    Fatty liver    GERD (gastroesophageal reflux disease)    Hypercholesterolemia    Hypertension    Lumbar stenosis    Lyme disease    Melanocarcinoma (HCC)    Osteoarthritis of both hips    Pre-diabetes    Psychotic disorder (HCC)    Pulmonary fibrosis (HCC)    Sleep apnea     Past Surgical History: Past Surgical History:  Procedure Laterality Date   ABDOMINAL HYSTERECTOMY     APPENDECTOMY  2006   BREAST EXCISIONAL BIOPSY Right 1997   neg   ESOPHAGOGASTRODUODENOSCOPY (EGD) WITH PROPOFOL  N/A 09/20/2022   Procedure: ESOPHAGOGASTRODUODENOSCOPY (EGD) WITH PROPOFOL ;  Surgeon: Luke Salaam, MD;  Location: [facility] ENDOSCOPY;  Service: Gastroenterology;  Laterality: N/A;   excision of melanoma on Right arm     HIATAL HERNIA REPAIR     Jan 2017   LAPAROSCOPY  2020     Allergies:   Allergies  Allergen Reactions   Acetaminophen  Other (See Comments)    Liver issues   Atorvastatin     Myalgias    Sertraline Hives and Shortness Of Breath   Cefuroxime Hives  Has tolerated 1st generation cephalosporin (CEPHALEXIN) in the past with no documented ADRs.    Codeine Hives   Tape     Adhesive tape    Amoxicillin Rash   Penicillins Rash    Has tolerated 1st generation cephalosporin (CEPHALEXIN) in the past with no documented ADRs.    Sulfa Antibiotics Rash     Social History:  reports that she quit smoking about 38 years ago. Her smoking use included cigarettes. She has never used smokeless tobacco. She reports that she does not drink alcohol  and does not use  drugs.  Family History: Family History  Problem Relation Age of Onset   Alcohol  abuse Mother    Arthritis Mother    Heart disease Mother    Liver disease Mother    Osteoporosis Mother    Clotting disorder Mother    Diabetes Father    Arthritis Father    Stroke Father    Sarcoidosis Father    Heart failure Father    Bipolar disorder Sister    Schizophrenia Sister    Depression Sister    Anxiety disorder Sister    Alcohol  abuse Sister    Diabetes Sister    Arthritis Sister    Thyroid  disease Sister    Lupus Sister    Depression Sister    Anxiety disorder Sister    Alcohol  abuse Sister    Depression Sister    Anxiety disorder Sister    Ehlers-Danlos syndrome Daughter      Physical Exam: Vitals:   10/19/23 0000 10/19/23 0100 10/19/23 0200 10/19/23 0204  BP: (!) 164/86 (!) 80/63 (!) 148/86 (!) 148/86  Pulse: 84 83 82 84  Resp: (!) 25 15 14  (!) 26  Temp:      TempSrc:      SpO2: 92% 90% 90% 91%  Weight:      Height:         General: Patient has moderate respiratory distress, has new 7 L oxygen requirement HEENT:       Eyes: PERRL, EOMI, no scleral icterus.       ENT: No discharge from the ears and nose, no pharynx injection, no tonsillar enlargement.        Neck: No JVD, no bruit, no mass felt. Heme: No neck lymph node enlargement. Cardiac: S1/S2, RRR, No murmurs, No gallops or rubs. Respiratory: Has coarse sound bilaterally GI: Soft, nondistended, nontender, no rebound pain, no organomegaly, BS present. GU: No hematuria Ext: No pitting leg edema bilaterally. 1+DP/PT pulse bilaterally. Musculoskeletal: No joint deformities, No joint redness or warmth, no limitation of ROM in spin. S/p of Lumbar surgery Skin: No rashes.  Neuro: Alert, oriented X3, cranial nerves II-XII grossly intact, moves all extremities normally.  Psych: Patient is not psychotic, no suicidal or hemocidal ideation.    Data reviewed:  I have personally reviewed following labs and imaging  studies Labs:  CBC: Recent Labs  Lab 10/18/23 2228  WBC 16.4*  HGB 12.5  HCT 38.0  MCV 93.8  PLT 184    Basic Metabolic Panel: Recent Labs  Lab 10/18/23 2228  NA 138  K 4.4  CL 102  CO2 25  GLUCOSE 167*  BUN 12  CREATININE 0.86  CALCIUM 8.6*   GFR Estimated Creatinine Clearance: 78 mL/min (by C-G formula based on SCr of 0.86 mg/dL). Liver Function Tests: No results for input(s): "AST", "ALT", "ALKPHOS", "BILITOT", "PROT", "ALBUMIN" in the last 168 hours. No results for input(s): "LIPASE", "AMYLASE" in the last 168 hours.  No results for input(s): "AMMONIA" in the last 168 hours. Coagulation profile No results for input(s): "INR", "PROTIME" in the last 168 hours.  Cardiac Enzymes: No results for input(s): "CKTOTAL", "CKMB", "CKMBINDEX", "TROPONINI" in the last 168 hours. BNP: Invalid input(s): "POCBNP" CBG: Recent Labs  Lab 10/17/23 1753  GLUCAP 182*   D-Dimer No results for input(s): "DDIMER" in the last 72 hours. Hgb A1c No results for input(s): "HGBA1C" in the last 72 hours. Lipid Profile No results for input(s): "CHOL", "HDL", "LDLCALC", "TRIG", "CHOLHDL", "LDLDIRECT" in the last 72 hours. Thyroid  function studies No results for input(s): "TSH", "T4TOTAL", "T3FREE", "THYROIDAB" in the last 72 hours.  Invalid input(s): "FREET3" Anemia work up No results for input(s): "VITAMINB12", "FOLATE", "FERRITIN", "TIBC", "IRON", "RETICCTPCT" in the last 72 hours. Urinalysis    Component Value Date/Time   COLORURINE YELLOW (A) 10/04/2023 1428   APPEARANCEUR HAZY (A) 10/04/2023 1428   LABSPEC 1.018 10/04/2023 1428   PHURINE 5.0 10/04/2023 1428   GLUCOSEU NEGATIVE 10/04/2023 1428   HGBUR NEGATIVE 10/04/2023 1428   BILIRUBINUR NEGATIVE 10/04/2023 1428   BILIRUBINUR negative 07/08/2023 1047   KETONESUR NEGATIVE 10/04/2023 1428   PROTEINUR NEGATIVE 10/04/2023 1428   UROBILINOGEN 0.2 07/08/2023 1047   NITRITE NEGATIVE 10/04/2023 1428   LEUKOCYTESUR NEGATIVE  10/04/2023 1428     Microbiology Recent Results (from the past 240 hours)  Resp panel by RT-PCR (RSV, Flu A&B, Covid) Anterior Nasal Swab     Status: None   Collection Time: 10/18/23 10:31 PM   Specimen: Anterior Nasal Swab  Result Value Ref Range Status   SARS Coronavirus 2 by RT PCR NEGATIVE NEGATIVE Final    Comment: (NOTE) SARS-CoV-2 target nucleic acids are NOT DETECTED.  The SARS-CoV-2 RNA is generally detectable in upper respiratory specimens during the acute phase of infection. The lowest concentration of SARS-CoV-2 viral copies this assay can detect is 138 copies/mL. A negative result does not preclude SARS-Cov-2 infection and should not be used as the sole basis for treatment or other patient management decisions. A negative result may occur with  improper specimen collection/handling, submission of specimen other than nasopharyngeal swab, presence of viral mutation(s) within the areas targeted by this assay, and inadequate number of viral copies(<138 copies/mL). A negative result must be combined with clinical observations, patient history, and epidemiological information. The expected result is Negative.  Fact Sheet for Patients:  BloggerCourse.com  Fact Sheet for Healthcare Providers:  SeriousBroker.it  This test is no t yet approved or cleared by the [place]  FDA and  has been authorized for detection and/or diagnosis of SARS-CoV-2 by FDA under an Emergency Use Authorization (EUA). This EUA will remain  in effect (meaning this test can be used) for the duration of the COVID-19 declaration under Section 564(b)(1) of the Act, 21 U.S.C.section 360bbb-3(b)(1), unless the authorization is terminated  or revoked sooner.       Influenza A by PCR NEGATIVE NEGATIVE Final   Influenza B by PCR NEGATIVE NEGATIVE Final    Comment: (NOTE) The Xpert Xpress SARS-CoV-2/FLU/RSV plus assay is intended as an aid in the  diagnosis of influenza from Nasopharyngeal swab specimens and should not be used as a sole basis for treatment. Nasal washings and aspirates are unacceptable for Xpert Xpress SARS-CoV-2/FLU/RSV testing.  Fact Sheet for Patients: BloggerCourse.com  Fact Sheet for Healthcare Providers: SeriousBroker.it  This test is not yet approved or cleared by the [place]  FDA and has been authorized for detection and/or diagnosis of SARS-CoV-2 by FDA under an  Emergency Use Authorization (EUA). This EUA will remain in effect (meaning this test can be used) for the duration of the COVID-19 declaration under Section 564(b)(1) of the Act, 21 U.S.C. section 360bbb-3(b)(1), unless the authorization is terminated or revoked.     Resp Syncytial Virus by PCR NEGATIVE NEGATIVE Final    Comment: (NOTE) Fact Sheet for Patients: BloggerCourse.com  Fact Sheet for Healthcare Providers: SeriousBroker.it  This test is not yet approved or cleared by the [place]  FDA and has been authorized for detection and/or diagnosis of SARS-CoV-2 by FDA under an Emergency Use Authorization (EUA). This EUA will remain in effect (meaning this test can be used) for the duration of the COVID-19 declaration under Section 564(b)(1) of the Act, 21 U.S.C. section 360bbb-3(b)(1), unless the authorization is terminated or revoked.  Performed at [facility], [address] [place], [place] [address]        Inpatient Medications:   Scheduled Meds:  baclofen   10 mg Oral TID   Chlorhexidine  Gluconate Cloth  6 each Topical Daily   vitamin D3  1,000 Units Oral Daily   docusate sodium   100 mg Oral BID   DULoxetine   30 mg Oral BID   enoxaparin  (LOVENOX ) injection  40 mg Subcutaneous Q24H   fluticasone  furoate-vilanterol  1 puff Inhalation Daily   gabapentin   800 mg Oral TID   ipratropium-albuterol   3 mL  Nebulization Q4H   methylPREDNISolone  (SOLU-MEDROL ) injection  80 mg Intravenous Q12H   multivitamin with minerals  1 tablet Oral Daily   pantoprazole   40 mg Oral Daily   sodium chloride  flush  3 mL Intravenous Q12H   traZODone   50 mg Oral QHS   Continuous Infusions:  ceFEPime (MAXIPIME) IV Stopped (10/19/23 0059)   metronidazole 500 mg (10/19/23 0103)     Radiological Exams on Admission: CT Angio Chest Pulmonary Embolism (PE) W or WO Contrast Result Date: 10/18/2023 CLINICAL DATA:  Pulmonary embolism (PE) suspected, high prob. Shortness of breath. EXAM: CT ANGIOGRAPHY CHEST WITH CONTRAST TECHNIQUE: Multidetector CT imaging of the chest was performed using the standard protocol during bolus administration of intravenous contrast. Multiplanar CT image reconstructions and MIPs were obtained to evaluate the vascular anatomy. RADIATION DOSE REDUCTION: This exam was performed according to the departmental dose-optimization program which includes automated exposure control, adjustment of the mA and/or kV according to patient size and/or use of iterative reconstruction technique. CONTRAST:  100mL OMNIPAQUE IOHEXOL 350 MG/ML SOLN COMPARISON:  None Available. FINDINGS: Cardiovascular: No filling defects in the pulmonary arteries to suggest pulmonary emboli. Heart is normal size. Aorta is normal caliber. Mediastinum/Nodes: No mediastinal, hilar, or axillary adenopathy. Trachea and esophagus are unremarkable. Thyroid  unremarkable. Lungs/Pleura: Posterior airspace opacities in both upper lobes and lower lobes, most pronounced in the posterior right upper lobe. While this could reflect dependent atelectasis, cannot exclude pneumonia. Trace bilateral pleural effusions. Upper Abdomen: Probable hepatomegaly. The liver is not imaged in its entirety. Diffuse low-density throughout the liver suggesting fatty infiltration. Musculoskeletal: Chest wall soft tissues are unremarkable. No acute bony abnormality. Review of the  MIP images confirms the above findings. IMPRESSION: Posterior upper and lower lobe airspace opacities bilaterally, right greater than left. This could reflect dependent atelectasis or pneumonia. Trace bilateral pleural effusions. Fatty liver. Suspect hepatomegaly. Electronically Signed   By: Janeece Mechanic M.D.   On: 10/18/2023 23:32   DG Lumbar Spine 2-3 Views Result Date: 10/17/2023 CLINICAL DATA:  Elective surgery. EXAM: LUMBAR SPINE - 2-3 VIEW COMPARISON:  None Available. FINDINGS: Seven  fluoroscopic spot views of the lumbar spine submitted from the operating room. Sequential images during posterior fusion at L4-L5 with interbody spacer. Fluoroscopy time 1 minutes 7 seconds. Dose 143.21 mGy IMPRESSION: Intraoperative fluoroscopy during posterior fusion at L4-L5. Electronically Signed   By: Chadwick Colonel M.D.   On: 10/17/2023 17:04   DG C-Arm 1-60 Min-No Report Result Date: 10/17/2023 Fluoroscopy was utilized by the requesting physician.  No radiographic interpretation.   DG C-Arm 1-60 Min-No Report Result Date: 10/17/2023 Fluoroscopy was utilized by the requesting physician.  No radiographic interpretation.   DG C-Arm 1-60 Min-No Report Result Date: 10/17/2023 Fluoroscopy was utilized by the requesting physician.  No radiographic interpretation.    Impression/Recommendations Principal Problem:   Spondylolisthesis Active Problems:   Chronic bilateral low back pain with bilateral sciatica   Spinal instability, lumbar   Postoperative CSF leak   Asthma   Pulmonary fibrosis (HCC)   Acute respiratory failure with hypoxia (HCC)   Hypertension   GERD (gastroesophageal reflux disease)   Bipolar I disorder, most recent episode manic, severe with psychotic features (HCC)   Obesity (BMI 30-39.9)    Assessment and Plan:  Spondylolisthesis and chronic bilateral low back pain with bilateral sciatica and spinal instability, lumbar: s/p of L4-5 minimally invasive transforaminal lumbar interbody  fusion, 1 Day Post-Op.  - Pain control per primary neurosurgical team - Baclofen  10 mg 3 times daily - Gabapentin  - As needed oxycodone   Postoperative CSF leak -Management per primary neurosurgical team  Acute respiratory failure with hypoxia in the setting of hx of ILD and asthma: Patient has 7L of new oxygen requirement.  CT negative PE, but showed posterior upper and lower lobe airspace opacities bilaterally, right greater than left. Pt has no fever, but has leukocytosis with WBC 16.4, indicating possible aspiration pneumonia.  Patient does not have history of CHF, but BNP slightly elevated 274.  Will give 1 dose of Lasix, 20 mg now. - Start cefepime and Flagyl - Sputum culture - Started Solu-Medrol  125 mg x 1, then 80 mg daily - Bronchodilators and as needed Mucinex  Ahthma and Pulmonary fibrosis (HCC) -Solu-Medrol  and bronchodilators as above  Hypertension -IV hydralazine as needed - Patient is not taking medications at home  GERD (gastroesophageal reflux disease) -Protonix   Bipolar I disorder, most recent episode manic, severe with psychotic features (HCC) -Cymbalta , trazodone   Obesity (BMI 30-39.9): Body weight 100.6 kg, BMI 37.48. pt is Obesity class II with BMI 35 - 39.9 - Encourage losing weight - Exercise and healthy diet    Thank you for this consultation.  Our [facility] hospitalist team will follow the patient with you.  Time Spent: 35 min     Noelle Hoogland M.D. [facility] 10/19/2023, 2:43 AM

## 2023-10-18 NOTE — Plan of Care (Signed)
  Problem: Education: Goal: Knowledge of General Education information will improve Description: Including pain rating scale, medication(s)/side effects and non-pharmacologic comfort measures Outcome: Progressing   Problem: Clinical Measurements: Goal: Will remain free from infection Outcome: Progressing Goal: Diagnostic test results will improve Outcome: Progressing Goal: Cardiovascular complication will be avoided Outcome: Progressing   Problem: Nutrition: Goal: Adequate nutrition will be maintained Outcome: Progressing   Problem: Coping: Goal: Level of anxiety will decrease Outcome: Progressing   Problem: Pain Managment: Goal: General experience of comfort will improve and/or be controlled Outcome: Progressing

## 2023-10-18 NOTE — Plan of Care (Signed)

## 2023-10-19 ENCOUNTER — Encounter: Payer: Self-pay | Admitting: Neurosurgery

## 2023-10-19 DIAGNOSIS — J9601 Acute respiratory failure with hypoxia: Secondary | ICD-10-CM | POA: Diagnosis not present

## 2023-10-19 LAB — BLOOD GAS, VENOUS
Acid-Base Excess: 6.4 mmol/L — ABNORMAL HIGH (ref 0.0–2.0)
Bicarbonate: 31.9 mmol/L — ABNORMAL HIGH (ref 20.0–28.0)
Patient temperature: 37
pCO2, Ven: 48 mmHg (ref 44–60)
pH, Ven: 7.43 (ref 7.25–7.43)

## 2023-10-19 LAB — BASIC METABOLIC PANEL WITH GFR
Anion gap: 12 (ref 5–15)
BUN: 14 mg/dL (ref 8–23)
CO2: 22 mmol/L (ref 22–32)
Calcium: 8.5 mg/dL — ABNORMAL LOW (ref 8.9–10.3)
Chloride: 99 mmol/L (ref 98–111)
Creatinine, Ser: 0.9 mg/dL (ref 0.44–1.00)
GFR, Estimated: >60 mL/min (ref >60)
Glucose, Bld: 211 mg/dL — ABNORMAL HIGH (ref 70–99)
Potassium: 3.7 mmol/L (ref 3.5–5.1)
Sodium: 133 mmol/L — ABNORMAL LOW (ref 135–145)

## 2023-10-19 LAB — CBC WITH DIFFERENTIAL/PLATELET
Abs Immature Granulocytes: 0.35 10*3/uL — ABNORMAL HIGH (ref 0.00–0.07)
Basophils Absolute: 0 10*3/uL (ref 0.0–0.1)
Basophils Relative: 0 %
Eosinophils Absolute: 0 10*3/uL (ref 0.0–0.5)
Eosinophils Relative: 0 %
HCT: 38.9 % (ref 36.0–46.0)
Hemoglobin: 13.3 g/dL (ref 12.0–15.0)
Immature Granulocytes: 2 %
Lymphocytes Relative: 7 %
Lymphs Abs: 1.5 10*3/uL (ref 0.7–4.0)
MCH: 30.9 pg (ref 26.0–34.0)
MCHC: 34.2 g/dL (ref 30.0–36.0)
MCV: 90.3 fL (ref 80.0–100.0)
Monocytes Absolute: 1.1 10*3/uL — ABNORMAL HIGH (ref 0.1–1.0)
Monocytes Relative: 5 %
Neutro Abs: 17.9 10*3/uL — ABNORMAL HIGH (ref 1.7–7.7)
Neutrophils Relative %: 86 %
Platelets: 226 10*3/uL (ref 150–400)
RBC: 4.31 MIL/uL (ref 3.87–5.11)
RDW: 14.1 % (ref 11.5–15.5)
WBC: 20.8 10*3/uL — ABNORMAL HIGH (ref 4.0–10.5)
nRBC: 0 % (ref 0.0–0.2)

## 2023-10-19 MED ORDER — METHOCARBAMOL 500 MG PO TABS
500.0000 mg | ORAL_TABLET | Freq: Three times a day (TID) | ORAL | Status: DC | PRN
Start: 2023-10-19 — End: 2023-10-22

## 2023-10-19 MED ORDER — IPRATROPIUM-ALBUTEROL 0.5-2.5 (3) MG/3ML IN SOLN
3.0000 mL | Freq: Two times a day (BID) | RESPIRATORY_TRACT | Status: DC
  Administered 2023-10-20 – 2023-10-22 (×5): 3 mL via RESPIRATORY_TRACT
  Filled 2023-10-19 (×5): qty 3

## 2023-10-19 MED ORDER — FUROSEMIDE 10 MG/ML IJ SOLN
20.0000 mg | Freq: Once | INTRAMUSCULAR | Status: AC
  Administered 2023-10-19: 20 mg via INTRAVENOUS
  Filled 2023-10-19: qty 2

## 2023-10-19 MED ORDER — METHYLPREDNISOLONE SODIUM SUCC 125 MG IJ SOLR
80.0000 mg | Freq: Two times a day (BID) | INTRAMUSCULAR | Status: DC
  Administered 2023-10-19 (×2): 80 mg via INTRAVENOUS
  Filled 2023-10-19 (×2): qty 2

## 2023-10-19 MED ORDER — METRONIDAZOLE 500 MG/100ML IV SOLN
500.0000 mg | Freq: Two times a day (BID) | INTRAVENOUS | Status: DC
  Administered 2023-10-19 – 2023-10-22 (×7): 500 mg via INTRAVENOUS
  Filled 2023-10-19 (×8): qty 100

## 2023-10-19 MED ORDER — SODIUM CHLORIDE 0.9 % IV SOLN
2.0000 g | Freq: Three times a day (TID) | INTRAVENOUS | Status: DC
  Administered 2023-10-19 – 2023-10-22 (×10): 2 g via INTRAVENOUS
  Filled 2023-10-19 (×11): qty 12.5

## 2023-10-19 MED ORDER — IPRATROPIUM-ALBUTEROL 0.5-2.5 (3) MG/3ML IN SOLN
3.0000 mL | Freq: Four times a day (QID) | RESPIRATORY_TRACT | Status: DC
  Administered 2023-10-19 (×2): 3 mL via RESPIRATORY_TRACT
  Filled 2023-10-19 (×2): qty 3

## 2023-10-19 NOTE — Plan of Care (Signed)
  Problem: Education: Goal: Ability to verbalize activity precautions or restrictions will improve Outcome: Progressing Goal: Knowledge of the prescribed therapeutic regimen will improve Outcome: Progressing   Problem: Activity: Goal: Will remain free from falls Outcome: Progressing   Problem: Bowel/Gastric: Goal: Gastrointestinal status for postoperative course will improve Outcome: Progressing   Problem: Clinical Measurements: Goal: Ability to maintain clinical measurements within normal limits will improve Outcome: Progressing Goal: Postoperative complications will be avoided or minimized Outcome: Progressing   Problem: Pain Management: Goal: Pain level will decrease Outcome: Progressing   Problem: Skin Integrity: Goal: Will show signs of wound healing Outcome: Progressing

## 2023-10-19 NOTE — Hospital Course (Signed)
 63yo with h/o ILD, HTN, HLD, preDM, bipolar d/o, and OSA not on CPAP who underwent L4-5 lumbar fusion on 5/19.  She was SOB with O2 87% on room air on 5/20.  Concern for aspiration PNA, started on Cefepime and metronidazole.

## 2023-10-19 NOTE — Plan of Care (Signed)
  Problem: Education: Goal: Knowledge of General Education information will improve Description: Including pain rating scale, medication(s)/side effects and non-pharmacologic comfort measures Outcome: Progressing   Problem: Health Behavior/Discharge Planning: Goal: Ability to manage health-related needs will improve Outcome: Progressing   Problem: Clinical Measurements: Goal: Ability to maintain clinical measurements within normal limits will improve Outcome: Progressing Goal: Will remain free from infection Outcome: Progressing Goal: Diagnostic test results will improve Outcome: Progressing Goal: Respiratory complications will improve Outcome: Progressing Goal: Cardiovascular complication will be avoided Outcome: Progressing   Problem: Clinical Measurements: Goal: Ability to maintain clinical measurements within normal limits will improve Outcome: Progressing Goal: Will remain free from infection Outcome: Progressing Goal: Diagnostic test results will improve Outcome: Progressing Goal: Respiratory complications will improve Outcome: Progressing Goal: Cardiovascular complication will be avoided Outcome: Progressing   Problem: Elimination: Goal: Will not experience complications related to bowel motility Outcome: Progressing Goal: Will not experience complications related to urinary retention Outcome: Progressing   Problem: Coping: Goal: Level of anxiety will decrease Outcome: Progressing   Problem: Pain Managment: Goal: General experience of comfort will improve and/or be controlled Outcome: Progressing   Problem: Skin Integrity: Goal: Risk for impaired skin integrity will decrease Outcome: Progressing

## 2023-10-19 NOTE — Progress Notes (Signed)
 Patient A/O x4. Stable pressures, on 3L [place]. Drain clamped, patient tolerated well.   NP at bedside, drain removed. Tolerated well.  Foley removed at 1600. Due to void by 10 pm.   Report given, patient requesting fleet enema  and due to void RN made aware in report.

## 2023-10-19 NOTE — Progress Notes (Signed)
 Attending Progress Note  History: Victoria [name] is here for L4-5 minimally invasive transforaminal lumbar interbody fusion, they are currently 2 Days Post-Op.   POD2: Pt complaining of a headache with changes in position this morning and back pain as well as pain and soreness in her right groin and anterior thighs. SOB overnight has improved some this morning  POD1: She is having some anterior bilateral thigh soreness, significantly improved from overnight.  In nondermatomal distribution.  No new numbness weakness or tingling.  No headaches.  No issues with the drain site overnight.  Physical Exam: Vitals:   10/19/23 0500 10/19/23 0636  BP: (!) 164/102 (!) 164/79  Pulse: 73 79  Resp: 18 (!) 24  Temp:    SpO2: 93% 94%   I/O last 3 completed shifts: In: 2117.9 [I.V.:1917.9; IV Piggyback:200] Out: 4951 [Urine:4600; Drains:351] No intake/output data recorded.  Output by Drain (mL) 10/17/23 0701 - 10/17/23 1900 10/17/23 1901 - 10/18/23 0700 10/18/23 0701 - 10/18/23 1900 10/18/23 1901 - 10/19/23 0700 10/19/23 0701 - 10/19/23 0721  Lumbar Drain 30 121 120 110     AA Ox3 CNI  Strength: 5/5 throughout BLE   Drain site is dry, connections have been checked without leakage.  Data:  Recent Labs  Lab 10/18/23 2228  NA 138  K 4.4  CL 102  CO2 25  BUN 12  CREATININE 0.86  GLUCOSE 167*  CALCIUM 8.6*   No results for input(s): "AST", "ALT", "ALKPHOS" in the last 168 hours.  Invalid input(s): "TBILI"   Recent Labs  Lab 10/18/23 2228  WBC 16.4*  HGB 12.5  HCT 38.0  PLT 184   No results for input(s): "APTT", "INR" in the last 168 hours.      CT Angio Chest Pulmonary Embolism (PE) W or WO Contrast Result Date: 10/18/2023 CLINICAL DATA:  Pulmonary embolism (PE) suspected, high prob. Shortness of breath. EXAM: CT ANGIOGRAPHY CHEST WITH CONTRAST TECHNIQUE: Multidetector CT imaging of the chest was performed using the standard protocol during bolus administration of intravenous  contrast. Multiplanar CT image reconstructions and MIPs were obtained to evaluate the vascular anatomy. RADIATION DOSE REDUCTION: This exam was performed according to the departmental dose-optimization program which includes automated exposure control, adjustment of the mA and/or kV according to patient size and/or use of iterative reconstruction technique. CONTRAST:  100mL OMNIPAQUE IOHEXOL 350 MG/ML SOLN COMPARISON:  None Available. FINDINGS: Cardiovascular: No filling defects in the pulmonary arteries to suggest pulmonary emboli. Heart is normal size. Aorta is normal caliber. Mediastinum/Nodes: No mediastinal, hilar, or axillary adenopathy. Trachea and esophagus are unremarkable. Thyroid  unremarkable. Lungs/Pleura: Posterior airspace opacities in both upper lobes and lower lobes, most pronounced in the posterior right upper lobe. While this could reflect dependent atelectasis, cannot exclude pneumonia. Trace bilateral pleural effusions. Upper Abdomen: Probable hepatomegaly. The liver is not imaged in its entirety. Diffuse low-density throughout the liver suggesting fatty infiltration. Musculoskeletal: Chest wall soft tissues are unremarkable. No acute bony abnormality. Review of the MIP images confirms the above findings. IMPRESSION: Posterior upper and lower lobe airspace opacities bilaterally, right greater than left. This could reflect dependent atelectasis or pneumonia. Trace bilateral pleural effusions. Fatty liver. Suspect hepatomegaly. Electronically Signed   By: Janeece Mechanic M.D.   On: 10/18/2023 23:32   DG Lumbar Spine 2-3 Views Result Date: 10/17/2023 CLINICAL DATA:  Elective surgery. EXAM: LUMBAR SPINE - 2-3 VIEW COMPARISON:  None Available. FINDINGS: Seven fluoroscopic spot views of the lumbar spine submitted from the operating room. Sequential images during  posterior fusion at L4-L5 with interbody spacer. Fluoroscopy time 1 minutes 7 seconds. Dose 143.21 mGy IMPRESSION: Intraoperative fluoroscopy  during posterior fusion at L4-L5. Electronically Signed   By: Chadwick Colonel M.D.   On: 10/17/2023 17:04   DG C-Arm 1-60 Min-No Report Result Date: 10/17/2023 Fluoroscopy was utilized by the requesting physician.  No radiographic interpretation.   DG C-Arm 1-60 Min-No Report Result Date: 10/17/2023 Fluoroscopy was utilized by the requesting physician.  No radiographic interpretation.   DG C-Arm 1-60 Min-No Report Result Date: 10/17/2023 Fluoroscopy was utilized by the requesting physician.  No radiographic interpretation.    Other tests/results: None  Assessment/Plan:  Victoria [name] is currently 2 Days Post-Op, from MIS TLIF.  - please clamp lumbar drain. Ok to ambulate with therapy - continue to monitor dressing.  - medicine consulted with SOB. CTA concerning for atelectasis va pneumonia started cefepime and flagyl.   Anastacio Karvonen PA-C Department of Neurosurgery

## 2023-10-19 NOTE — Progress Notes (Signed)
 Pharmacy Antibiotic Note  Victoria [name] is a 63 y.o. female admitted on 10/17/2023 with aspiration PNA.  Pharmacy has been consulted for Cefepime dosing.  PCN allergy.   Plan: Cefepime 2 gm IV Q8H to start on 5/21 @ 0100.   - also metronidazole 500 mg IV Q12H   Height: 5' 4.5" (163.8 cm) Weight: 100.6 kg (221 lb 12.5 oz) IBW/kg (Calculated) : 55.85  Temp (24hrs), Avg:98.9 F (37.2 C), Min:98 F (36.7 C), Max:100.3 F (37.9 C)  Recent Labs  Lab 10/18/23 2228  WBC 16.4*  CREATININE 0.86    Estimated Creatinine Clearance: 78 mL/min (by C-G formula based on SCr of 0.86 mg/dL).    Allergies  Allergen Reactions   Acetaminophen  Other (See Comments)    Liver issues   Atorvastatin     Myalgias    Sertraline Hives and Shortness Of Breath   Cefuroxime Hives    Has tolerated 1st generation cephalosporin (CEPHALEXIN) in the past with no documented ADRs.    Codeine Hives   Tape     Adhesive tape    Amoxicillin Rash   Penicillins Rash    Has tolerated 1st generation cephalosporin (CEPHALEXIN) in the past with no documented ADRs.    Sulfa Antibiotics Rash    Antimicrobials this admission:   >>    >>   Dose adjustments this admission:   Microbiology results:  BCx:   UCx:    Sputum:    MRSA PCR:   Thank you for allowing pharmacy to be a part of this patient's care.  Mckynzi Cammon D 10/19/2023 12:23 AM

## 2023-10-19 NOTE — Consult Note (Signed)
 Initial Consultation Note   Patient: Victoria [name] GNF:[id number] DOB: 1960/08/27 PCP: Rockney Cid, DO DOA: 10/17/2023 DOS: the patient was seen and examined on 10/19/2023 Primary service: Jodeen Munch, MD  Referring physician: Mont Antis Reason for consult: Hypoxia, SOB   Assessment and Plan:  Spondylolisthesis and chronic bilateral low back pain with bilateral sciatica and spinal instability, lumbar s/p of L4-5 minimally invasive transforaminal lumbar interbody fusion on 5/19 Pain control per primary neurosurgical team Baclofen  10 mg 3 times daily Continue gabapentin  As needed oxycodone    Postoperative CSF leak Management per primary neurosurgical team   Acute respiratory failure with hypoxia in the setting of hx of ILD and asthma 7L new oxygen requirement, currently down to 5L   CT negative PE, but showed posterior upper and lower lobe airspace opacities bilaterally, right greater than left No fever, but has leukocytosis with WBC 16.4, indicating possible aspiration pneumonia Patient does not have history of CHF, given 1 dose of Lasix 20 mg On cefepime and Flagyl Sputum culture Started Solu-Medrol  125 mg x 1, then 80 mg BID Bronchodilators and as needed Mucinex   Hypertension IV hydralazine as needed Patient is not taking medications at home   GERD (gastroesophageal reflux disease) Continue Protonix    Bipolar I disorder, most recent episode manic, severe with psychotic features  Continue Cymbalta , trazodone    Class 2 Obesity  Body mass index is 37.48 kg/m.Aaron Aas  Weight loss should be encouraged Outpatient PCP/bariatric medicine f/u encouraged Significantly low or high BMI is associated with higher medical risk including morbidity and mortality       [facility] will continue to follow the patient.  HPI: Victoria [name] is a 63 y.o. female with past medical history of ILD, HTN, HLD, preDM, bipolar d/o, and OSA not on CPAP who underwent L4-5 lumbar fusion on  5/19.  She was SOB with O2 87% on room air on 5/20.  Concern for aspiration PNA, started on Cefepime and metronidazole.  She reports some improvement in her breathing today.  Continues to have significant back pain, greater than anticipated.  Review of Systems: As mentioned in the history of present illness. All other systems reviewed and are negative. Past Medical History:  Diagnosis Date   Asthma    Cervical spinal stenosis    Complication of anesthesia    Dyspnea    Dysrhythmia    Fatty liver    GERD (gastroesophageal reflux disease)    Hypercholesterolemia    Hypertension    Lumbar stenosis    Lyme disease    Melanocarcinoma (HCC)    Osteoarthritis of both hips    Pre-diabetes    Psychotic disorder (HCC)    Pulmonary fibrosis (HCC)    Sleep apnea    Past Surgical History:  Procedure Laterality Date   ABDOMINAL HYSTERECTOMY     APPENDECTOMY  2006   BREAST EXCISIONAL BIOPSY Right 1997   neg   ESOPHAGOGASTRODUODENOSCOPY (EGD) WITH PROPOFOL  N/A 09/20/2022   Procedure: ESOPHAGOGASTRODUODENOSCOPY (EGD) WITH PROPOFOL ;  Surgeon: Luke Salaam, MD;  Location: [facility] END[facility]COPY;  Service: Gastroenterology;  Laterality: N/A;   excision of melanoma on Right arm     HIATAL HERNIA REPAIR     Jan 2017   LAPAROSCOPY  2020   Social History:  reports that she quit smoking about 38 years ago. Her smoking use included cigarettes. She has never used smokeless tobacco. She reports that she does not drink alcohol  and does not use drugs.  Allergies  Allergen Reactions   Acetaminophen  Other (See Comments)  Liver issues   Atorvastatin     Myalgias    Sertraline Hives and Shortness Of Breath   Cefuroxime Hives    Has tolerated 1st generation cephalosporin (CEPHALEXIN) in the past with no documented ADRs.    Codeine Hives   Tape     Adhesive tape    Amoxicillin Rash   Penicillins Rash    Has tolerated 1st generation cephalosporin (CEPHALEXIN) in the past with no documented ADRs.    Sulfa  Antibiotics Rash    Family History  Problem Relation Age of Onset   Alcohol  abuse Mother    Arthritis Mother    Heart disease Mother    Liver disease Mother    Osteoporosis Mother    Clotting disorder Mother    Diabetes Father    Arthritis Father    Stroke Father    Sarcoidosis Father    Heart failure Father    Bipolar disorder Sister    Schizophrenia Sister    Depression Sister    Anxiety disorder Sister    Alcohol  abuse Sister    Diabetes Sister    Arthritis Sister    Thyroid  disease Sister    Lupus Sister    Depression Sister    Anxiety disorder Sister    Alcohol  abuse Sister    Depression Sister    Anxiety disorder Sister    Ehlers-Danlos syndrome Daughter     Prior to Admission medications   Medication Sig Start Date End Date Taking? Authorizing Provider  albuterol  (VENTOLIN  HFA) 108 (90 Base) MCG/ACT inhaler Inhale 1-2 puffs into the lungs every 6 (six) hours as needed for wheezing or shortness of breath. 12/08/22  Yes Tapia, Leisa, PA-C  aspirin 325 MG tablet Take 325 mg by mouth every 6 (six) hours as needed for moderate pain (pain score 4-6).   Yes [provider]  B Complex Vitamins (B-COMPLEX/B-12 PO) Take by mouth.   Yes [provider]  baclofen  (LIORESAL ) 10 MG tablet Take 10 mg by mouth 3 (three) times daily.   Yes [provider]  cholecalciferol  (VITAMIN D ) 25 MCG tablet Take 1 tablet (1,000 Units total) by mouth daily. 11/20/19  Yes Clapacs, Elida Grounds, MD  conjugated estrogens (PREMARIN) vaginal cream Place 1 applicator vaginally once a week. 04/09/20  Yes [provider]  DULoxetine  (CYMBALTA ) 30 MG capsule Take 30 mg by mouth 2 (two) times daily. 01/27/23 10/17/23 Yes [provider]  fluticasone  furoate-vilanterol (BREO ELLIPTA ) 200-25 MCG/ACT AEPB Inhale 1 puff into the lungs daily.   Yes [provider]  gabapentin  (NEURONTIN ) 800 MG tablet Take 800 mg by mouth 3 (three) times daily. 08/29/23 11/27/23 Yes  [provider]  ipratropium-albuterol  (DUONEB) 0.5-2.5 (3) MG/3ML SOLN Take 3 mLs by nebulization 3 (three) times daily as needed (cough wheeze SOB bronchitis/asthma symptoms). 12/08/22  Yes Tapia, Leisa, PA-C  ketoconazole  (NIZORAL ) 2 % cream Apply 1 Application topically daily. 12/30/22  Yes Rockney Cid, DO  miconazole (MONISTAT 1 COMBINATION PACK) kit Place 1 each vaginally once.   Yes [provider]  MOVANTIK  25 MG TABS tablet Take 25 mg by mouth daily as needed (Constipation). 06/23/23  Yes [provider]  Multiple Vitamins-Minerals (MULTIVITAMIN WITH MINERALS) tablet Take 1 tablet by mouth daily.   Yes [provider]  ondansetron  (ZOFRAN ) 8 MG tablet Take 1 tablet (8 mg total) by mouth every 8 (eight) hours as needed for nausea or vomiting. Patient taking differently: Take 4 mg by mouth every 8 (eight) hours  as needed for nausea or vomiting. 06/18/22  Yes Rockney Cid, DO  Oxycodone  HCl 10 MG TABS Take 10 mg by mouth 3 (three) times daily.   Yes [provider]  pantoprazole  (PROTONIX ) 40 MG tablet TAKE 1 TABLET BY MOUTH EVERY DAY IN THE MORNING 08/18/23  Yes Rockney Cid, DO  polyvinyl alcohol  (LIQUIFILM TEARS) 1.4 % ophthalmic solution Place 1 drop into both eyes 3 (three) times daily. Patient taking differently: Place 1 drop into both eyes daily as needed for dry eyes. 11/19/19  Yes Clapacs, Elida Grounds, MD  traZODone  (DESYREL ) 50 MG tablet Take 50 mg by mouth at bedtime.   Yes [provider]  budesonide -formoterol  (SYMBICORT ) 160-4.5 MCG/ACT inhaler Inhale 2 puffs into the lungs 2 (two) times daily. 09/27/23   Rockney Cid, DO  Respiratory Therapy Supplies (NEBULIZER/TUBING/MOUTHPIECE) KIT Disp one nebulizer machine, tubing set and mouthpiece kit Patient not taking: Reported on 09/27/2023 12/08/22   Adeline Hone, PA-C    Physical Exam: Vitals:   10/19/23 0408 10/19/23 0500 10/19/23 0636 10/19/23 0732  BP: (!) 160/89  (!) 164/102 (!) 164/79   Pulse: 75 73 79   Resp: 18 18 (!) 24   Temp:      TempSrc:      SpO2: 93% 93% 94% 95%  Weight:      Height:         Intake/Output Summary (Last 24 hours) at 10/19/2023 0924 Last data filed at 10/19/2023 0600 Gross per 24 hour  Intake 1103.76 ml  Output 3960 ml  Net -2856.24 ml   Filed Weights   10/17/23 0936 10/17/23 1802  Weight: 93.4 kg 100.6 kg    Exam:  General:  Appears calm and comfortable and is in NAD, on 5L [place] O2 Eyes:  normal lids, iris ENT:  grossly normal hearing, lips & tongue, mmm Cardiovascular:  RRR. No LE edema.  Respiratory:   CTA bilaterally with no wheezes/rales/rhonchi.  Normal to mildly increased respiratory effort. Abdomen:  soft, NT, ND Back:   drain in place with sanguinous drainage in the bag Skin:  no rash or induration seen on limited exam Musculoskeletal:  grossly normal tone BUE/BLE, no bony abnormality Psychiatric:  blunted mood and affect, speech fluent and appropriate, AOx3 Neurologic:  CN 2-12 grossly intact, moves all extremities in coordinated fashion  Data Reviewed: I have reviewed the patient's lab results since admission.  Pertinent labs for today include:   Na++ 133, not clinically significant Glucose 211 WBC 20.8    Family Communication: Husband was present throughout evaluation  Thank you very much for involving us  in the care of your patient.  Author: Lorita Rosa, MD 10/19/2023 9:20 AM  For on call review [email or web address].

## 2023-10-20 DIAGNOSIS — M4316 Spondylolisthesis, lumbar region: Secondary | ICD-10-CM | POA: Diagnosis not present

## 2023-10-20 LAB — CBC WITH DIFFERENTIAL/PLATELET
Abs Immature Granulocytes: 0.24 10*3/uL — ABNORMAL HIGH (ref 0.00–0.07)
Basophils Absolute: 0 10*3/uL (ref 0.0–0.1)
Basophils Relative: 0 %
Eosinophils Absolute: 0 10*3/uL (ref 0.0–0.5)
Eosinophils Relative: 0 %
HCT: 40.8 % (ref 36.0–46.0)
Hemoglobin: 13.7 g/dL (ref 12.0–15.0)
Immature Granulocytes: 1 %
Lymphocytes Relative: 9 %
Lymphs Abs: 1.9 10*3/uL (ref 0.7–4.0)
MCH: 30.8 pg (ref 26.0–34.0)
MCHC: 33.6 g/dL (ref 30.0–36.0)
MCV: 91.7 fL (ref 80.0–100.0)
Monocytes Absolute: 1.2 10*3/uL — ABNORMAL HIGH (ref 0.1–1.0)
Monocytes Relative: 5 %
Neutro Abs: 18.2 10*3/uL — ABNORMAL HIGH (ref 1.7–7.7)
Neutrophils Relative %: 85 %
Platelets: 235 10*3/uL (ref 150–400)
RBC: 4.45 MIL/uL (ref 3.87–5.11)
RDW: 14.4 % (ref 11.5–15.5)
WBC: 21.5 10*3/uL — ABNORMAL HIGH (ref 4.0–10.5)
nRBC: 0 % (ref 0.0–0.2)

## 2023-10-20 LAB — BASIC METABOLIC PANEL WITH GFR
Anion gap: 9 (ref 5–15)
BUN: 19 mg/dL (ref 8–23)
CO2: 27 mmol/L (ref 22–32)
Calcium: 8.9 mg/dL (ref 8.9–10.3)
Chloride: 101 mmol/L (ref 98–111)
Creatinine, Ser: 0.87 mg/dL (ref 0.44–1.00)
GFR, Estimated: >60 mL/min (ref >60)
Glucose, Bld: 176 mg/dL — ABNORMAL HIGH (ref 70–99)
Potassium: 4.3 mmol/L (ref 3.5–5.1)
Sodium: 137 mmol/L (ref 135–145)

## 2023-10-20 LAB — GLUCOSE, CAPILLARY
Glucose-Capillary: 140 mg/dL — ABNORMAL HIGH (ref 70–99)
Glucose-Capillary: 148 mg/dL — ABNORMAL HIGH (ref 70–99)

## 2023-10-20 MED ORDER — INSULIN ASPART 100 UNIT/ML IJ SOLN: 0.0000 [IU] | Freq: Three times a day (TID) | INTRAMUSCULAR | Status: DC

## 2023-10-20 MED ORDER — LOSARTAN POTASSIUM 25 MG PO TABS
25.0000 mg | ORAL_TABLET | Freq: Every day | ORAL | Status: DC
  Administered 2023-10-20 – 2023-10-22 (×3): 25 mg via ORAL
  Filled 2023-10-20 (×3): qty 1

## 2023-10-20 MED ORDER — MAGNESIUM CITRATE PO SOLN
1.0000 | Freq: Once | ORAL | Status: AC
  Administered 2023-10-20: 1 via ORAL
  Filled 2023-10-20: qty 296

## 2023-10-20 MED ORDER — OXYCODONE HCL 5 MG PO TABS
10.0000 mg | ORAL_TABLET | ORAL | Status: DC | PRN
  Administered 2023-10-20: 10 mg via ORAL
  Filled 2023-10-20: qty 2

## 2023-10-20 MED ORDER — OXYCODONE HCL 5 MG PO TABS
5.0000 mg | ORAL_TABLET | ORAL | Status: DC | PRN
  Administered 2023-10-20 – 2023-10-22 (×5): 5 mg via ORAL
  Filled 2023-10-20 (×7): qty 1

## 2023-10-20 MED ORDER — POLYETHYLENE GLYCOL 3350 17 G PO PACK
17.0000 g | PACK | Freq: Every day | ORAL | Status: DC
  Administered 2023-10-21 – 2023-10-22 (×2): 17 g via ORAL
  Filled 2023-10-20 (×2): qty 1

## 2023-10-20 MED ORDER — TAMSULOSIN HCL 0.4 MG PO CAPS
0.4000 mg | ORAL_CAPSULE | Freq: Every day | ORAL | Status: DC
  Administered 2023-10-20 – 2023-10-21 (×2): 0.4 mg via ORAL
  Filled 2023-10-20 (×2): qty 1

## 2023-10-20 MED ORDER — ENSURE ENLIVE PO LIQD
237.0000 mL | Freq: Two times a day (BID) | ORAL | Status: DC
  Administered 2023-10-20 – 2023-10-22 (×4): 237 mL via ORAL

## 2023-10-20 NOTE — Consult Note (Signed)
 Initial Consultation Note   Patient: Victoria [name]:[id number] DOB: [birth year] PCP: Rockney Cid, DO DOA: 10/17/2023 DOS: the patient was seen and examined on 10/20/2023 Primary service: Jodeen Munch, MD  Referring physician: Mont Antis Reason for consult: Hypoxia, SOB   Assessment and Plan:  Spondylolisthesis and chronic bilateral low back pain with bilateral sciatica and spinal instability, lumbar s/p of L4-5 minimally invasive transforaminal lumbar interbody fusion on 5/19 Pain control per primary neurosurgical team Baclofen  10 mg 3 times daily Continue gabapentin  As needed oxycodone    Postoperative CSF leak Drain was pulled yesterday Management per primary neurosurgical team   Acute respiratory failure with hypoxia in the setting of hx of ILD and asthma 7L new oxygen requirement, currently down to 2L   CT negative PE, but showed posterior upper and lower lobe airspace opacities bilaterally, right greater than left No fever, but has leukocytosis, concern for possible aspiration pneumonia Patient does not have history of CHF, given 1 dose of Lasix 20 mg On cefepime and Flagyl Sputum culture Started Solu-Medrol  125 mg x 1, then 80 mg BID; she is refusing additional steroids at this time because they make her hyper Bronchodilators and as needed Mucinex   Hypertension IV hydralazine as needed Patient is not taking medications at home BP currently 160/73 with range 125/64-170/81 Will add losartan (DM) 25 mg daily  DM Recent A1c was 6.6 Not on home medications Cover with moderate-scale SSI Carb modified diet    GERD (gastroesophageal reflux disease) Continue Protonix    Bipolar I disorder, most recent episode manic, severe with psychotic features  Continue Cymbalta , trazodone    Class 2 Obesity  Body mass index is 37.48 kg/m.Aaron Aas  Weight loss should be encouraged Outpatient PCP/bariatric medicine f/u encouraged Significantly low or high BMI is associated  with higher medical risk including morbidity and mortality       [facility] will continue to follow the patient.  Victoria [name] is a 63 y.o. female with past medical history of ILD, HTN, HLD, preDM, bipolar d/o, and OSA not on CPAP who underwent L4-5 lumbar fusion on 5/19.  She was SOB with O2 87% on room air on 5/20.  Concern for aspiration PNA, started on Cefepime and metronidazole.  Continues to improve from a respiratory standpoint, now on 2L  O2.  Has not had a recent BM despite addition of colace and miralax .  Review of Systems: As mentioned in the history of present illness. All other systems reviewed and are negative. Past Medical History:  Diagnosis Date   Asthma    Cervical spinal stenosis    Complication of anesthesia    Dyspnea    Dysrhythmia    Fatty liver    GERD (gastroesophageal reflux disease)    Hypercholesterolemia    Hypertension    Lumbar stenosis    Lyme disease    Melanocarcinoma (HCC)    Osteoarthritis of both hips    Pre-diabetes    Psychotic disorder (HCC)    Pulmonary fibrosis (HCC)    Sleep apnea    Past Surgical History:  Procedure Laterality Date   ABDOMINAL HYSTERECTOMY     APPENDECTOMY  2006   APPLICATION OF INTRAOPERATIVE CT SCAN N/A 10/17/2023   Procedure: APPLICATION OF INTRAOPERATIVE CT SCAN;  Surgeon: Jodeen Munch, MD;  Location: [facility];  Service: Neurosurgery;  Laterality: N/A;   BREAST EXCISIONAL BIOPSY Right 1997   neg   ESOPHAGOGASTRODUODENOSCOPY (EGD) WITH PROPOFOL  N/A 09/20/2022   Procedure: ESOPHAGOGASTRODUODENOSCOPY (EGD) WITH PROPOFOL ;  Surgeon: Luke Salaam, MD;  Location:  [facility];  Service: Gastroenterology;  Laterality: N/A;   excision of melanoma on Right arm     HIATAL HERNIA REPAIR     Jan 2017   LAPAROSCOPY  2020   TRANSFORAMINAL LUMBAR INTERBODY FUSION W/ MIS 1 LEVEL N/A 10/17/2023   Procedure: L4-5 MINIMALLY INVASIVE (MIS) TRANSFORAMINAL LUMBAR INTERBODY FUSION (TLIF);  Surgeon: Jodeen Munch, MD;   Location: [facility];  Service: Neurosurgery;  Laterality: N/A;  L4-5 MINIMALLY INVASIVE (MIS) TRANSFORAMINAL LUMBAR INTERBODY FUSION (TLIF)   Social History:  reports that she quit smoking about 38 years ago. Her smoking use included cigarettes. She has never used smokeless tobacco. She reports that she does not drink alcohol  and does not use drugs.  Allergies  Allergen Reactions   Acetaminophen  Other (See Comments)    Liver issues   Atorvastatin     Myalgias    Sertraline Hives and Shortness Of Breath   Cefuroxime Hives    Has tolerated 1st generation cephalosporin (CEPHALEXIN) in the past with no documented ADRs.    Codeine Hives   Tape     Adhesive tape    Amoxicillin Rash   Penicillins Rash    Has tolerated 1st generation cephalosporin (CEPHALEXIN) in the past with no documented ADRs.    Sulfa Antibiotics Rash    Family History  Problem Relation Age of Onset   Alcohol  abuse Mother    Arthritis Mother    Heart disease Mother    Liver disease Mother    Osteoporosis Mother    Clotting disorder Mother    Diabetes Father    Arthritis Father    Stroke Father    Sarcoidosis Father    Heart failure Father    Bipolar disorder Sister    Schizophrenia Sister    Depression Sister    Anxiety disorder Sister    Alcohol  abuse Sister    Diabetes Sister    Arthritis Sister    Thyroid  disease Sister    Lupus Sister    Depression Sister    Anxiety disorder Sister    Alcohol  abuse Sister    Depression Sister    Anxiety disorder Sister    Ehlers-Danlos syndrome Daughter     Prior to Admission medications   Medication Sig Start Date End Date Taking? Authorizing Provider  albuterol  (VENTOLIN  HFA) 108 (90 Base) MCG/ACT inhaler Inhale 1-2 puffs into the lungs every 6 (six) hours as needed for wheezing or shortness of breath. 12/08/22  Yes Tapia, Leisa, PA-C  aspirin 325 MG tablet Take 325 mg by mouth every 6 (six) hours as needed for moderate pain (pain score 4-6).   Yes [provider]  B Complex Vitamins (B-COMPLEX/B-12 PO) Take by mouth.   Yes [provider]  baclofen  (LIORESAL ) 10 MG tablet Take 10 mg by mouth 3 (three) times daily.   Yes [provider]  cholecalciferol  (VITAMIN D ) 25 MCG tablet Take 1 tablet (1,000 Units total) by mouth daily. 11/20/19  Yes Clapacs, Elida Grounds, MD  conjugated estrogens (PREMARIN) vaginal cream Place 1 applicator vaginally once a week. 04/09/20  Yes [provider]  DULoxetine  (CYMBALTA ) 30 MG capsule Take 30 mg by mouth 2 (two) times daily. 01/27/23 10/17/23 Yes [provider]  fluticasone  furoate-vilanterol (BREO ELLIPTA ) 200-25 MCG/ACT AEPB Inhale 1 puff into the lungs daily.   Yes [provider]  gabapentin  (NEURONTIN ) 800 MG tablet Take 800 mg by mouth 3 (three) times daily. 08/29/23 11/27/23 Yes [provider]  ipratropium-albuterol  (DUONEB) 0.5-2.5 (3)  MG/3ML SOLN Take 3 mLs by nebulization 3 (three) times daily as needed (cough wheeze SOB bronchitis/asthma symptoms). 12/08/22  Yes Tapia, Leisa, PA-C  ketoconazole  (NIZORAL ) 2 % cream Apply 1 Application topically daily. 12/30/22  Yes Rockney Cid, DO  miconazole (MONISTAT 1 COMBINATION PACK) kit Place 1 each vaginally once.   Yes [provider]  MOVANTIK  25 MG TABS tablet Take 25 mg by mouth daily as needed (Constipation). 06/23/23  Yes [provider]  Multiple Vitamins-Minerals (MULTIVITAMIN WITH MINERALS) tablet Take 1 tablet by mouth daily.   Yes [provider]  ondansetron  (ZOFRAN ) 8 MG tablet Take 1 tablet (8 mg total) by mouth every 8 (eight) hours as needed for nausea or vomiting. Patient taking differently: Take 4 mg by mouth every 8 (eight) hours as needed for nausea or vomiting. 06/18/22  Yes Rockney Cid, DO  Oxycodone  HCl 10 MG TABS Take 10 mg by mouth 3 (three) times daily.   Yes [provider]  pantoprazole  (PROTONIX ) 40 MG tablet TAKE 1 TABLET BY MOUTH EVERY DAY  IN THE MORNING 08/18/23  Yes Rockney Cid, DO  polyvinyl alcohol  (LIQUIFILM TEARS) 1.4 % ophthalmic solution Place 1 drop into both eyes 3 (three) times daily. Patient taking differently: Place 1 drop into both eyes daily as needed for dry eyes. 11/19/19  Yes Clapacs, Elida Grounds, MD  traZODone  (DESYREL ) 50 MG tablet Take 50 mg by mouth at bedtime.   Yes [provider]  budesonide -formoterol  (SYMBICORT ) 160-4.5 MCG/ACT inhaler Inhale 2 puffs into the lungs 2 (two) times daily. 09/27/23   Rockney Cid, DO  Respiratory Therapy Supplies (NEBULIZER/TUBING/MOUTHPIECE) KIT Disp one nebulizer machine, tubing set and mouthpiece kit Patient not taking: Reported on 09/27/2023 12/08/22   Adeline Hone, PA-C    Physical Exam: Vitals:   10/19/23 1931 10/19/23 1952 10/20/23 0012 10/20/23 0246  BP: 125/64  (!) 157/81 (!) 161/83  Pulse: 81  86 87  Resp: 18  18 17   Temp: 98.2 F (36.8 C)  98.8 F (37.1 C) 98.8 F (37.1 C)  TempSrc: Oral     SpO2: 93% 92% 93% 91%  Weight:      Height:         Intake/Output Summary (Last 24 hours) at 10/20/2023 0744 Last data filed at 10/20/2023 0300 Gross per 24 hour  Intake 1165.94 ml  Output 1425 ml  Net -259.06 ml   Filed Weights   10/17/23 0936 10/17/23 1802  Weight: 93.4 kg 100.6 kg    Exam:  General:  Appears calm and comfortable and is in NAD, on 2L [place] O2 Eyes:  normal lids, iris ENT:  grossly normal hearing, lips & tongue, mmm Cardiovascular:  RRR. No LE edema.  Respiratory:   CTA bilaterally with no wheezes/rales/rhonchi.  Normal to mildly increased respiratory effort. Abdomen:  soft, NT, ND Back:   wound with sutures in place, looks C/D/I; small bandage where drain was with scant drainage Skin:  no rash or induration seen on limited exam Musculoskeletal:  grossly normal tone BUE/BLE, no bony abnormality Psychiatric:  blunted mood and affect, speech fluent and appropriate, AOx3 Neurologic:  CN 2-12 grossly intact, moves all  extremities in coordinated fashion  Data Reviewed: I have reviewed the patient's lab results since admission.  Pertinent labs for today include:   Glucose 176 WBC 21.5   Family Communication: Husband was present throughout evaluation  Thank you very much for involving us  in the care of your patient.  Author: Lorita Rosa, MD 10/20/2023 7:44  AM  For on call review [email or web address].

## 2023-10-20 NOTE — Progress Notes (Addendum)
 Attending Progress Note  History: Victoria [name] is here for L4-5 minimally invasive transforaminal lumbar interbody fusion, they are currently 3 Days Post-Op.   POD3: She continues to have some headache and nausea. Has not had a BM in 6 days  POD2: Pt complaining of a headache with changes in position this morning and back pain as well as pain and soreness in her right groin and anterior thighs. SOB overnight has improved some this morning  POD1: She is having some anterior bilateral thigh soreness, significantly improved from overnight.  In nondermatomal distribution.  No new numbness weakness or tingling.  No headaches.  No issues with the drain site overnight.  Physical Exam: Vitals:   10/20/23 0246 10/20/23 0822  BP: (!) 161/83 (!) 160/73  Pulse: 87 83  Resp: 17 18  Temp: 98.8 F (37.1 C) 98 F (36.7 C)  SpO2: 91% 92%   I/O last 3 completed shifts: In: 1521.4 [P.O.:720; I.V.:161.5; IV Piggyback:639.9] Out: 1610 [RUEAV:4098; Drains:120] No intake/output data recorded.  Output by Drain (mL) 10/18/23 0701 - 10/18/23 1900 10/18/23 1901 - 10/19/23 0700 10/19/23 0701 - 10/19/23 1900 10/19/23 1901 - 10/20/23 0700 10/20/23 0701 - 10/20/23 0836  Lumbar Drain 120 110 10      AA Ox3 CNI  Strength: 5/5 throughout BLE   Incisions are c/d/I  Abdomen is distended by non rigid and non TTP  Data:  Recent Labs  Lab 10/18/23 2228 10/19/23 0939  NA 138 133*  K 4.4 3.7  CL 102 99  CO2 25 22  BUN 12 14  CREATININE 0.86 0.90  GLUCOSE 167* 211*  CALCIUM 8.6* 8.5*   No results for input(s): "AST", "ALT", "ALKPHOS" in the last 168 hours.  Invalid input(s): "TBILI"   Recent Labs  Lab 10/18/23 2228 10/19/23 0939  WBC 16.4* 20.8*  HGB 12.5 13.3  HCT 38.0 38.9  PLT 184 226   No results for input(s): "APTT", "INR" in the last 168 hours.      CT Angio Chest Pulmonary Embolism (PE) W or WO Contrast Result Date: 10/18/2023 CLINICAL DATA:  Pulmonary embolism (PE) suspected,  high prob. Shortness of breath. EXAM: CT ANGIOGRAPHY CHEST WITH CONTRAST TECHNIQUE: Multidetector CT imaging of the chest was performed using the standard protocol during bolus administration of intravenous contrast. Multiplanar CT image reconstructions and MIPs were obtained to evaluate the vascular anatomy. RADIATION DOSE REDUCTION: This exam was performed according to the departmental dose-optimization program which includes automated exposure control, adjustment of the mA and/or kV according to patient size and/or use of iterative reconstruction technique. CONTRAST:  100mL OMNIPAQUE IOHEXOL 350 MG/ML SOLN COMPARISON:  None Available. FINDINGS: Cardiovascular: No filling defects in the pulmonary arteries to suggest pulmonary emboli. Heart is normal size. Aorta is normal caliber. Mediastinum/Nodes: No mediastinal, hilar, or axillary adenopathy. Trachea and esophagus are unremarkable. Thyroid  unremarkable. Lungs/Pleura: Posterior airspace opacities in both upper lobes and lower lobes, most pronounced in the posterior right upper lobe. While this could reflect dependent atelectasis, cannot exclude pneumonia. Trace bilateral pleural effusions. Upper Abdomen: Probable hepatomegaly. The liver is not imaged in its entirety. Diffuse low-density throughout the liver suggesting fatty infiltration. Musculoskeletal: Chest wall soft tissues are unremarkable. No acute bony abnormality. Review of the MIP images confirms the above findings. IMPRESSION: Posterior upper and lower lobe airspace opacities bilaterally, right greater than left. This could reflect dependent atelectasis or pneumonia. Trace bilateral pleural effusions. Fatty liver. Suspect hepatomegaly. Electronically Signed   By: Janeece Mechanic M.D.   On: 10/18/2023  23:32    Other tests/results: None  Assessment/Plan:  Victoria [name] is currently 3 Days Post-Op, from MIS TLIF.  - continue to monitor dressing.  - medicine consulted with SOB. CTA concerning for  atelectasis va pneumonia started cefepime and flagyl.  - will discuss constipation with IM. Plan to escalate bowel regimen - continue Fioricet for headache  Anastacio Karvonen PA-C Department of Neurosurgery

## 2023-10-20 NOTE — Progress Notes (Signed)
 Patient has not voided this shift. Bladder scanned patient and unable to obtain accurate reading. Patient states her bladder feels distended. Per MD order in and out craterization at this time. Patient tolerated well and 400 mls of clear yellow/orange urine drained from bladder. Patient stated she will attempt to ambulate to bathroom in a few hours. Will continue to monitor foe output.

## 2023-10-20 NOTE — Plan of Care (Signed)
  Problem: Education: Goal: Knowledge of General Education information will improve Description: Including pain rating scale, medication(s)/side effects and non-pharmacologic comfort measures Outcome: Progressing   Problem: Health Behavior/Discharge Planning: Goal: Ability to manage health-related needs will improve Outcome: Progressing   Problem: Clinical Measurements: Goal: Ability to maintain clinical measurements within normal limits will improve Outcome: Progressing Goal: Will remain free from infection Outcome: Progressing Goal: Diagnostic test results will improve Outcome: Progressing Goal: Respiratory complications will improve Outcome: Progressing Goal: Cardiovascular complication will be avoided Outcome: Progressing   Problem: Activity: Goal: Risk for activity intolerance will decrease Outcome: Progressing   Problem: Nutrition: Goal: Adequate nutrition will be maintained Outcome: Progressing   Problem: Coping: Goal: Level of anxiety will decrease Outcome: Progressing   Problem: Pain Managment: Goal: General experience of comfort will improve and/or be controlled Outcome: Progressing   Problem: Safety: Goal: Ability to remain free from injury will improve Outcome: Progressing   Problem: Skin Integrity: Goal: Risk for impaired skin integrity will decrease Outcome: Progressing   Problem: Education: Goal: Ability to verbalize activity precautions or restrictions will improve Outcome: Progressing Goal: Knowledge of the prescribed therapeutic regimen will improve Outcome: Progressing Goal: Understanding of discharge needs will improve Outcome: Progressing   Problem: Activity: Goal: Ability to avoid complications of mobility impairment will improve Outcome: Progressing Goal: Ability to tolerate increased activity will improve Outcome: Progressing Goal: Will remain free from falls Outcome: Progressing   Problem: Bowel/Gastric: Goal: Gastrointestinal  status for postoperative course will improve Outcome: Progressing   Problem: Clinical Measurements: Goal: Ability to maintain clinical measurements within normal limits will improve Outcome: Progressing Goal: Postoperative complications will be avoided or minimized Outcome: Progressing Goal: Diagnostic test results will improve Outcome: Progressing   Problem: Skin Integrity: Goal: Will show signs of wound healing Outcome: Progressing   Problem: Health Behavior/Discharge Planning: Goal: Identification of resources available to assist in meeting health care needs will improve Outcome: Progressing   Problem: Education: Goal: Ability to verbalize activity precautions or restrictions will improve Outcome: Progressing Goal: Knowledge of the prescribed therapeutic regimen will improve Outcome: Progressing Goal: Understanding of discharge needs will improve Outcome: Progressing   Problem: Activity: Goal: Ability to avoid complications of mobility impairment will improve Outcome: Progressing Goal: Ability to tolerate increased activity will improve Outcome: Progressing Goal: Will remain free from falls Outcome: Progressing   Problem: Bowel/Gastric: Goal: Gastrointestinal status for postoperative course will improve Outcome: Progressing   Problem: Clinical Measurements: Goal: Ability to maintain clinical measurements within normal limits will improve Outcome: Progressing Goal: Postoperative complications will be avoided or minimized Outcome: Progressing Goal: Diagnostic test results will improve Outcome: Progressing   Problem: Skin Integrity: Goal: Will show signs of wound healing Outcome: Progressing   Problem: Elimination: Goal: Will not experience complications related to bowel motility Outcome: Not Progressing Goal: Will not experience complications related to urinary retention Outcome: Not Progressing   Problem: Pain Management: Goal: Pain level will  decrease Outcome: Not Progressing   Problem: Bladder/Genitourinary: Goal: Urinary functional status for postoperative course will improve Outcome: Not Progressing   Problem: Pain Management: Goal: Pain level will decrease Outcome: Not Progressing

## 2023-10-20 NOTE — Evaluation (Signed)
 Occupational Therapy Evaluation Patient Details Name: Victoria [name] MRN: [id number] DOB: [birth year] Today's Date: 10/20/2023   History of Present Illness   Victoria [name] is a 63yoF who comes to [facility] for elective lumbar procedure to address ongoing spondylosis, immobilizing pain. PMH of ILD, spondylolisthesis, chronic low back pain, hypertension, hyperlipidemia, prediabetes, GERD, bipolar, Lyme's disease, OSA not using CPAP. Pt developed postoperative SOB, hypoxia, transfered to ICU, hospitalzist services consulted.CTA negative for PE, but showed some opacity.     Clinical Impressions Pt was seen for OT evaluation this date. Prior to hospital admission, pt was generally indep with most ADL and IADL (assist for tub transfer from spouse), using SPC PRN with pain for mobility. Pt lives with her spouse and eager to improve in order to care for her horses. Pt presents to acute OT demonstrating impaired ADL performance and functional mobility 2/2 decreased strength, balance, activity tolerance and precautions following spinal surgery (See OT problem list for additional functional deficits). Pt currently requires CGA with RW and VC for hand placement to stand from a recliner, MIN A for log roll back to bed, and MIN-MOD A for LB ADL in general. MAX A provided to detangle her hair during education to prevent hair matting. Pt/spouse instructed in home/routines modifications, falls prevention, AE/DME, precautions and positioning to support recovery, handout provided. Pt/spouse verbalized understanding. Pt would benefit from skilled OT services to address noted impairments and functional limitations (see below for any additional details) in order to maximize safety and independence while minimizing falls risk and caregiver burden. Anticipate the need for follow up OT services upon acute hospital DC.    If plan is discharge home, recommend the following:   A little help with walking and/or transfers;A lot  of help with bathing/dressing/bathroom;Assistance with cooking/housework;Assist for transportation;Help with stairs or ramp for entrance     Functional Status Assessment   Patient has had a recent decline in their functional status and demonstrates the ability to make significant improvements in function in a reasonable and predictable amount of time.       Precautions/Restrictions   Precautions Precautions: Fall;Back Precaution Booklet Issued: Yes (comment) Recall of Precautions/Restrictions: Intact Restrictions Weight Bearing Restrictions Per Provider Order: No     Mobility Bed Mobility Overal bed mobility: Needs Assistance Bed Mobility: Sit to Sidelying, Rolling Rolling: Supervision       Sit to sidelying: Min assist, Used rails General bed mobility comments: MIN A for BLE mgt    Transfers Overall transfer level: Needs assistance Equipment used: Rolling walker (2 wheels) Transfers: Sit to/from Stand Sit to Stand: Contact guard assist           General transfer comment: VC for hands placement      Balance Overall balance assessment: Needs assistance Sitting-balance support: No upper extremity supported, Feet supported Sitting balance-Leahy Scale: Good     Standing balance support: Bilateral upper extremity supported, Reliant on assistive device for balance Standing balance-Leahy Scale: Fair        ADL either performed or assessed with clinical judgement   ADL Overall ADL's : Needs assistance/impaired      General ADL Comments: Pt requires MIN-MOD A for LB ADL, CGA for ADL transfers with RW, and MIN A for toileting.      Pertinent Vitals/Pain Pain Assessment Pain Assessment: 0-10 Pain Score: 6  Pain Location: surgical sites, legs, buttocks (improved agfter AMB) Pain Descriptors / Indicators: Aching Pain Intervention(s): Limited activity within patient's tolerance, Monitored during session, Patient requesting pain meds-RN notified  Extremity/Trunk Assessment Upper Extremity Assessment Upper Extremity Assessment: Overall WFL for tasks assessed   Lower Extremity Assessment Lower Extremity Assessment: Defer to PT evaluation   Cervical / Trunk Assessment Cervical / Trunk Assessment: Back Surgery   Communication Communication Communication: No apparent difficulties   Cognition Arousal: Alert Behavior During Therapy: WFL for tasks assessed/performed Cognition: No apparent impairments      Following commands: Intact       Cueing  General Comments   Cueing Techniques: Verbal cues      Exercises Other Exercises Other Exercises: Pt/spouse instructed in home/routines modifications, falls prevention, AE/DME, precautions and positioning to support recovery, handout provided.   Shoulder Instructions      Home Living Family/patient expects to be discharged to:: Private residence Living Arrangements: Spouse/significant other Available Help at Discharge: Family   Home Access: Stairs to enter Secretary/administrator of Steps: 1   Home Layout: One level               Home Equipment: Cane - single point;Grab bars - tub/shower;Hand held shower head          Prior Functioning/Environment Prior Level of Function : Independent/Modified Independent             Mobility Comments: mostly household AMB c  PRN SPC when pain was bad. ADLs Comments: help with getting into tub shower via husband.    OT Problem List: Decreased strength;Pain;Decreased range of motion;Impaired balance (sitting and/or standing);Decreased knowledge of use of DME or AE;Obesity;Decreased knowledge of precautions   OT Treatment/Interventions: Self-care/ADL training;Therapeutic exercise;Therapeutic activities;DME and/or AE instruction;Patient/family education;Balance training      OT Goals(Current goals can be found in the care plan section)   Acute Rehab OT Goals Patient Stated Goal: go home OT Goal Formulation: With  patient/family Time For Goal Achievement: 11/03/23 Potential to Achieve Goals: Good ADL Goals Pt Will Perform Upper Body Dressing: sitting;with modified independence Pt Will Perform Lower Body Dressing: sitting/lateral leans;sit to/from stand;with min assist;with caregiver independent in assisting Pt Will Transfer to Toilet: ambulating;bedside commode;regular height toilet;with supervision (LRAD) Pt Will Perform Toileting - Clothing Manipulation and hygiene: with modified independence;sitting/lateral leans Additional ADL Goal #1: Pt will verbalize 100% of her back precautions and how to maintain.   OT Frequency:  Min 2X/week       AM-PAC OT "6 Clicks" Daily Activity     Outcome Measure Help from another person eating meals?: None Help from another person taking care of personal grooming?: A Little Help from another person toileting, which includes using toliet, bedpan, or urinal?: A Little Help from another person bathing (including washing, rinsing, drying)?: A Little Help from another person to put on and taking off regular upper body clothing?: A Little Help from another person to put on and taking off regular lower body clothing?: A Little 6 Click Score: 19   End of Session Equipment Utilized During Treatment: Rolling walker (2 wheels) Nurse Communication: Mobility status  Activity Tolerance: Patient tolerated treatment well Patient left: in bed;with call bell/phone within reach;with bed alarm set;with nursing/sitter in room  OT Visit Diagnosis: Other abnormalities of gait and mobility (R26.89);Pain Pain - Right/Left:  (back)                Time: 1610-9604 OT Time Calculation (min): 36 min Charges:  OT General Charges $OT Visit: 1 Visit OT Evaluation $OT Eval Low Complexity: 1 Low OT Treatments $Self Care/Home Management : 23-37 mins  Berenda Breaker., MPH, MS, OTR/L ascom [phone] 10/20/23, 3:36  PM

## 2023-10-20 NOTE — Evaluation (Signed)
 Physical Therapy Evaluation Patient Details Name: Victoria [name] MRN: [id number] DOB: 18-Oct-1960 Today's Date: 10/20/2023  History of Present Illness  Victoria [name] is a 63yoF who comes to [facility] for elective lumbar procedure to address ongoing spondylosis, immobilizing pain. PMH of ILD, spondylolisthesis, chronic low back pain, hypertension, hyperlipidemia, prediabetes, GERD, bipolar, Lyme's disease, OSA not using CPAP. Pt developed postoperative SOB, hypoxia, transfered to ICU, hospitalzist services consulted.CTA negative for PE, but showed some opacity.  Clinical Impression  Pt awake, lunch eaten, pt agreeable to PT assessment- husband [name] at bedside. Pt requires minA to perform bed mobility and transfers. Education on BAT precautions, log roll, RW/BSC use, transfers, and AMB. Pt asks for additional pain meds in session. Pt unable to void despite urge. Pt assisted back to recliner at end of session, RN and OT at bed side. Will continue to advance AMB and stairs training as appropriate.       If plan is discharge home, recommend the following: A little help with walking and/or transfers;Direct supervision/assist for medications management;Assist for transportation;Assistance with cooking/housework;Help with stairs or ramp for entrance   Can travel by private vehicle        Equipment Recommendations Rolling walker (2 wheels);BSC/3in1  Recommendations for Other Services       Functional Status Assessment Patient has had a recent decline in their functional status and demonstrates the ability to make significant improvements in function in a reasonable and predictable amount of time.     Precautions / Restrictions Precautions Precautions: Fall;Back Precaution Booklet Issued: Yes (comment) Recall of Precautions/Restrictions: Intact Restrictions Weight Bearing Restrictions Per Provider Order: No      Mobility  Bed Mobility Overal bed mobility: Needs Assistance Bed Mobility:  Rolling, Sidelying to Sit Rolling: Supervision Sidelying to sit: Min assist       General bed mobility comments: educated pt and husband on log roll technique, requires minA of trunk    Transfers Overall transfer level: Needs assistance Equipment used: Rolling walker (2 wheels) Transfers: Sit to/from Stand Sit to Stand: From elevated surface, Contact guard assist           General transfer comment: needs cues for best hand placement,    Ambulation/Gait Ambulation/Gait assistance: Supervision Gait Distance (Feet): 25 Feet (twice) Assistive device: Rolling walker (2 wheels) Gait Pattern/deviations: Step-through pattern     Pre-gait activities: alternate forward/backward AMB General Gait Details: does well with RW, author drive IV pole, O2 doffered durign gait for assessment  Stairs            Wheelchair Mobility     Tilt Bed    Modified Rankin (Stroke Patients Only)       Balance                                             Pertinent Vitals/Pain Pain Assessment Pain Assessment: 0-10 Pain Score: 6  Pain Location: surgical sites, legs, buttocks (improved agfter AMB) Pain Descriptors / Indicators: Aching Pain Intervention(s): Limited activity within patient's tolerance, Monitored during session, Patient requesting pain meds-RN notified    Home Living Family/patient expects to be discharged to:: Private residence Living Arrangements: Spouse/significant other Available Help at Discharge: Family   Home Access: Stairs to enter   Secretary/administrator of Steps: 1   Home Layout: One level Home Equipment: Cane - single point;Grab bars - tub/shower;Hand held shower head  Prior Function Prior Level of Function : Independent/Modified Independent             Mobility Comments: mostly household AMB c  PRN SPC when pain was bad. ADLs Comments: help with getting into tub show via husband.     Extremity/Trunk Assessment                 Communication        Cognition                                         Cueing       General Comments      Exercises     Assessment/Plan    PT Assessment Patient needs continued PT services  PT Problem List Decreased activity tolerance;Decreased mobility;Decreased balance;Decreased knowledge of precautions       PT Treatment Interventions DME instruction;Gait training;Functional mobility training;Stair training;Therapeutic activities;Therapeutic exercise;Balance training;Patient/family education    PT Goals (Current goals can be found in the Care Plan section)  Acute Rehab PT Goals Patient Stated Goal: regain ability to advance AMB without a RW PT Goal Formulation: With patient Time For Goal Achievement: 11/03/23 Potential to Achieve Goals: Fair    Frequency 7X/week     Co-evaluation               AM-PAC PT "6 Clicks" Mobility  Outcome Measure Help needed turning from your back to your side while in a flat bed without using bedrails?: A Lot Help needed moving from lying on your back to sitting on the side of a flat bed without using bedrails?: A Lot Help needed moving to and from a bed to a chair (including a wheelchair)?: A Lot Help needed standing up from a chair using your arms (e.g., wheelchair or bedside chair)?: A Lot Help needed to walk in hospital room?: A Little Help needed climbing 3-5 steps with a railing? : A Little 6 Click Score: 14    End of Session Equipment Utilized During Treatment: Oxygen Activity Tolerance: Patient tolerated treatment well;No increased pain Patient left: in chair;with nursing/sitter in room;with family/visitor present Nurse Communication: Mobility status (pain meds, sutures, bladder scan, O2 sats) PT Visit Diagnosis: Difficulty in walking, not elsewhere classified (R26.2);Other abnormalities of gait and mobility (R26.89)    Time: 4098-1191 PT Time Calculation (min) (ACUTE ONLY): 26  min   Charges:   PT Evaluation $PT Eval High Complexity: 1 High PT Treatments $Therapeutic Activity: 8-22 mins PT General Charges $$ ACUTE PT VISIT: 1 Visit       2:31 PM, 10/20/23 Dawn Eth, PT, DPT Physical Therapist - [facility]  [phone] (ASCOM)    Marymargaret Kirker C 10/20/2023, 2:24 PM

## 2023-10-20 NOTE — Care Management Important Message (Signed)
 Important Message  Patient Details  Name: Victoria [name] MRN: [id number] Date of Birth: Aug 10, 1960   Important Message Given:  Yes - Medicare IM     Zelene Barga W, CMA 10/20/2023, 12:32 PM

## 2023-10-21 ENCOUNTER — Telehealth: Payer: Self-pay | Admitting: Urology

## 2023-10-21 DIAGNOSIS — M4316 Spondylolisthesis, lumbar region: Secondary | ICD-10-CM | POA: Diagnosis not present

## 2023-10-21 LAB — CBC WITH DIFFERENTIAL/PLATELET
Abs Immature Granulocytes: 0.09 10*3/uL — ABNORMAL HIGH (ref 0.00–0.07)
Basophils Absolute: 0 10*3/uL (ref 0.0–0.1)
Basophils Relative: 0 %
Eosinophils Absolute: 0 10*3/uL (ref 0.0–0.5)
Eosinophils Relative: 0 %
HCT: 38.1 % (ref 36.0–46.0)
Hemoglobin: 12.5 g/dL (ref 12.0–15.0)
Immature Granulocytes: 1 %
Lymphocytes Relative: 20 %
Lymphs Abs: 2.6 10*3/uL (ref 0.7–4.0)
MCH: 30.6 pg (ref 26.0–34.0)
MCHC: 32.8 g/dL (ref 30.0–36.0)
MCV: 93.4 fL (ref 80.0–100.0)
Monocytes Absolute: 1 10*3/uL (ref 0.1–1.0)
Monocytes Relative: 8 %
Neutro Abs: 9.1 10*3/uL — ABNORMAL HIGH (ref 1.7–7.7)
Neutrophils Relative %: 71 %
Platelets: 197 10*3/uL (ref 150–400)
RBC: 4.08 MIL/uL (ref 3.87–5.11)
RDW: 14.4 % (ref 11.5–15.5)
WBC: 12.8 10*3/uL — ABNORMAL HIGH (ref 4.0–10.5)
nRBC: 0 % (ref 0.0–0.2)

## 2023-10-21 LAB — GLUCOSE, CAPILLARY
Glucose-Capillary: 132 mg/dL — ABNORMAL HIGH (ref 70–99)
Glucose-Capillary: 141 mg/dL — ABNORMAL HIGH (ref 70–99)
Glucose-Capillary: 139 mg/dL — ABNORMAL HIGH (ref 70–99)
Glucose-Capillary: 122 mg/dL — ABNORMAL HIGH (ref 70–99)

## 2023-10-21 LAB — BASIC METABOLIC PANEL WITH GFR
Anion gap: 8 (ref 5–15)
BUN: 27 mg/dL — ABNORMAL HIGH (ref 8–23)
CO2: 26 mmol/L (ref 22–32)
Calcium: 8.8 mg/dL — ABNORMAL LOW (ref 8.9–10.3)
Chloride: 105 mmol/L (ref 98–111)
Creatinine, Ser: 0.87 mg/dL (ref 0.44–1.00)
GFR, Estimated: >60 mL/min (ref >60)
Glucose, Bld: 140 mg/dL — ABNORMAL HIGH (ref 70–99)
Potassium: 3.9 mmol/L (ref 3.5–5.1)
Sodium: 139 mmol/L (ref 135–145)

## 2023-10-21 MED ORDER — BENZONATATE 100 MG PO CAPS
100.0000 mg | ORAL_CAPSULE | Freq: Two times a day (BID) | ORAL | Status: DC | PRN
  Administered 2023-10-21: 100 mg via ORAL
  Filled 2023-10-21 (×2): qty 1

## 2023-10-21 MED ORDER — GABAPENTIN 300 MG PO CAPS
900.0000 mg | ORAL_CAPSULE | Freq: Three times a day (TID) | ORAL | Status: DC
  Administered 2023-10-21 – 2023-10-22 (×4): 900 mg via ORAL
  Filled 2023-10-21 (×4): qty 3

## 2023-10-21 MED ORDER — BISACODYL 5 MG PO TBEC: 5.0000 mg | DELAYED_RELEASE_TABLET | Freq: Every day | ORAL | Status: DC | PRN

## 2023-10-21 NOTE — Consult Note (Signed)
 Initial Consultation Note   Patient: Victoria [name] UJW:[id number] DOB: September 21, 1960 PCP: Rockney Cid, DO DOA: 10/17/2023 DOS: the patient was seen and examined on 10/21/2023 Primary service: Jodeen Munch, MD  Referring physician: Mont Antis Reason for consult: SOB, hypoxia   Assessment and Plan:  Spondylolisthesis and chronic bilateral low back pain with bilateral sciatica and spinal instability, lumbar s/p of L4-5 minimally invasive transforaminal lumbar interbody fusion on 5/19 Pain control per primary neurosurgical team Baclofen  10 mg 3 times daily Continue gabapentin  As needed oxycodone    Postoperative CSF leak Drain was pulled yesterday Management per primary neurosurgical team   Acute respiratory failure with hypoxia in the setting of hx of ILD and asthma 7L new oxygen requirement, currently down to 2L   CT negative PE, but showed posterior upper and lower lobe airspace opacities bilaterally, right greater than left No fever, but has leukocytosis, concern for possible aspiration pneumonia Patient does not have history of CHF, given 1 dose of Lasix 20 mg On cefepime and Flagyl Sputum culture Started Solu-Medrol  125 mg x 1, then 80 mg BID; she is refusing additional steroids at this time because they make her hyper Bronchodilators and as needed Mucinex  Acute urinary retention Post-operative, possibly related to anesthesia No other evidence of weakness so minimal concern that this is related to her surgery I&O -> foley Started on tamsulosin Will need outpatient voiding trial - Dr. Estanislao Heimlich to arrange   Hypertension IV hydralazine as needed Patient is not taking medications at home BP currently 160/73 with range 125/64-170/81 Will add losartan (DM) 25 mg daily   DM Recent A1c was 6.6 Not on home medications Cover with moderate-scale SSI Carb modified diet    GERD (gastroesophageal reflux disease) Continue Protonix    Bipolar I disorder, most recent  episode manic, severe with psychotic features  Continue Cymbalta , trazodone    Class 2 Obesity  Body mass index is 37.48 kg/m.Aaron Aas  Weight loss should be encouraged Outpatient PCP/bariatric medicine f/u encouraged Significantly low or high BMI is associated with higher medical risk including morbidity and mortality       TRH will continue to follow the patient.  HPI: Victoria [name] is a 63 y.o. female with past medical history of ILD, HTN, HLD, preDM, bipolar d/o, and OSA not on CPAP who underwent L4-5 lumbar fusion on 5/19.  She was SOB with O2 87% on room air on 5/20.  Concern for aspiration PNA, started on Cefepime and metronidazole.  Continues to improve from a respiratory standpoint, now on RA.  Had a BM yesterday.  Had  urinary retention yesterday; started on tamsulosin and foley placed.  She is very eager to go home but still requiring O2 so will need at least another day.  She reports that she was coughing prior to the surgery and thinks PNA may have been present pre-operatively.  Reports hurting all over.  Review of Systems: As mentioned in the history of present illness. All other systems reviewed and are negative. Past Medical History:  Diagnosis Date   Asthma    Cervical spinal stenosis    Complication of anesthesia    Dyspnea    Dysrhythmia    Fatty liver    GERD (gastroesophageal reflux disease)    Hypercholesterolemia    Hypertension    Lumbar stenosis    Lyme disease    Melanocarcinoma (HCC)    Osteoarthritis of both hips    Pre-diabetes    Psychotic disorder (HCC)    Pulmonary fibrosis (HCC)  Sleep apnea    Past Surgical History:  Procedure Laterality Date   ABDOMINAL HYSTERECTOMY     APPENDECTOMY  2006   APPLICATION OF INTRAOPERATIVE CT SCAN N/A 10/17/2023   Procedure: APPLICATION OF INTRAOPERATIVE CT SCAN;  Surgeon: Jodeen Munch, MD;  Location: [facility] ORS;  Service: Neurosurgery;  Laterality: N/A;   BREAST EXCISIONAL BIOPSY Right 1997   neg    ESOPHAGOGASTRODUODENOSCOPY (EGD) WITH PROPOFOL  N/A 09/20/2022   Procedure: ESOPHAGOGASTRODUODENOSCOPY (EGD) WITH PROPOFOL ;  Surgeon: Luke Salaam, MD;  Location: [facility] END[facility]COPY;  Service: Gastroenterology;  Laterality: N/A;   excision of melanoma on Right arm     HIATAL HERNIA REPAIR     Jan 2017   LAPAROSCOPY  2020   TRANSFORAMINAL LUMBAR INTERBODY FUSION W/ MIS 1 LEVEL N/A 10/17/2023   Procedure: L4-5 MINIMALLY INVASIVE (MIS) TRANSFORAMINAL LUMBAR INTERBODY FUSION (TLIF);  Surgeon: Jodeen Munch, MD;  Location: [facility] ORS;  Service: Neurosurgery;  Laterality: N/A;  L4-5 MINIMALLY INVASIVE (MIS) TRANSFORAMINAL LUMBAR INTERBODY FUSION (TLIF)   Social History:  reports that she quit smoking about 38 years ago. Her smoking use included cigarettes. She has never used smokeless tobacco. She reports that she does not drink alcohol  and does not use drugs.  Allergies  Allergen Reactions   Acetaminophen  Other (See Comments)    Liver issues   Atorvastatin     Myalgias    Sertraline Hives and Shortness Of Breath   Cefuroxime Hives    Has tolerated 1st generation cephalosporin (CEPHALEXIN) in the past with no documented ADRs.    Codeine Hives   Tape     Adhesive tape    Amoxicillin Rash   Penicillins Rash    Has tolerated 1st generation cephalosporin (CEPHALEXIN) in the past with no documented ADRs.    Sulfa Antibiotics Rash    Family History  Problem Relation Age of Onset   Alcohol  abuse Mother    Arthritis Mother    Heart disease Mother    Liver disease Mother    Osteoporosis Mother    Clotting disorder Mother    Diabetes Father    Arthritis Father    Stroke Father    Sarcoidosis Father    Heart failure Father    Bipolar disorder Sister    Schizophrenia Sister    Depression Sister    Anxiety disorder Sister    Alcohol  abuse Sister    Diabetes Sister    Arthritis Sister    Thyroid  disease Sister    Lupus Sister    Depression Sister    Anxiety disorder Sister    Alcohol   abuse Sister    Depression Sister    Anxiety disorder Sister    Ehlers-Danlos syndrome Daughter     Prior to Admission medications   Medication Sig Start Date End Date Taking? Authorizing Provider  albuterol  (VENTOLIN  HFA) 108 (90 Base) MCG/ACT inhaler Inhale 1-2 puffs into the lungs every 6 (six) hours as needed for wheezing or shortness of breath. 12/08/22  Yes Tapia, Leisa, PA-C  aspirin 325 MG tablet Take 325 mg by mouth every 6 (six) hours as needed for moderate pain (pain score 4-6).   Yes [provider]  B Complex Vitamins (B-COMPLEX/B-12 PO) Take by mouth.   Yes [provider]  baclofen  (LIORESAL ) 10 MG tablet Take 10 mg by mouth 3 (three) times daily.   Yes [provider]  cholecalciferol  (VITAMIN D ) 25 MCG tablet Take 1 tablet (1,000 Units total) by mouth daily. 11/20/19  Yes Clapacs, Elida Grounds, MD  conjugated estrogens (PREMARIN) vaginal cream Place 1 applicator vaginally once a week. 04/09/20  Yes [provider]  DULoxetine  (CYMBALTA ) 30 MG capsule Take 30 mg by mouth 2 (two) times daily. 01/27/23 10/17/23 Yes [provider]  fluticasone  furoate-vilanterol (BREO ELLIPTA ) 200-25 MCG/ACT AEPB Inhale 1 puff into the lungs daily.   Yes [provider]  gabapentin  (NEURONTIN ) 800 MG tablet Take 800 mg by mouth 3 (three) times daily. 08/29/23 11/27/23 Yes [provider]  ipratropium-albuterol  (DUONEB) 0.5-2.5 (3) MG/3ML SOLN Take 3 mLs by nebulization 3 (three) times daily as needed (cough wheeze SOB bronchitis/asthma symptoms). 12/08/22  Yes Tapia, Leisa, PA-C  ketoconazole  (NIZORAL ) 2 % cream Apply 1 Application topically daily. 12/30/22  Yes Rockney Cid, DO  miconazole (MONISTAT 1 COMBINATION PACK) kit Place 1 each vaginally once.   Yes [provider]  MOVANTIK  25 MG TABS tablet Take 25 mg by mouth daily as needed (Constipation). 06/23/23  Yes [provider]  Multiple Vitamins-Minerals (MULTIVITAMIN WITH  MINERALS) tablet Take 1 tablet by mouth daily.   Yes [provider]  ondansetron  (ZOFRAN ) 8 MG tablet Take 1 tablet (8 mg total) by mouth every 8 (eight) hours as needed for nausea or vomiting. Patient taking differently: Take 4 mg by mouth every 8 (eight) hours as needed for nausea or vomiting. 06/18/22  Yes Rockney Cid, DO  Oxycodone  HCl 10 MG TABS Take 10 mg by mouth 3 (three) times daily.   Yes [provider]  pantoprazole  (PROTONIX ) 40 MG tablet TAKE 1 TABLET BY MOUTH EVERY DAY IN THE MORNING 08/18/23  Yes Rockney Cid, DO  polyvinyl alcohol  (LIQUIFILM TEARS) 1.4 % ophthalmic solution Place 1 drop into both eyes 3 (three) times daily. Patient taking differently: Place 1 drop into both eyes daily as needed for dry eyes. 11/19/19  Yes Clapacs, Elida Grounds, MD  traZODone  (DESYREL ) 50 MG tablet Take 50 mg by mouth at bedtime.   Yes [provider]  budesonide -formoterol  (SYMBICORT ) 160-4.5 MCG/ACT inhaler Inhale 2 puffs into the lungs 2 (two) times daily. 09/27/23   Rockney Cid, DO  Respiratory Therapy Supplies (NEBULIZER/TUBING/MOUTHPIECE) KIT Disp one nebulizer machine, tubing set and mouthpiece kit Patient not taking: Reported on 09/27/2023 12/08/22   Adeline Hone, PA-C    Physical Exam: Vitals:   10/20/23 2007 10/20/23 2120 10/21/23 0332 10/21/23 0755  BP:  138/71 (!) 150/72 (!) 142/78  Pulse:  86 93 79  Resp:  18 18 16   Temp:  98.5 F (36.9 C) 98.2 F (36.8 C) 98.5 F (36.9 C)  TempSrc:    Oral  SpO2: 94% 95% 93% 92%  Weight:      Height:          Intake/Output Summary (Last 24 hours) at 10/21/2023 0758 Last data filed at 10/20/2023 2200 Gross per 24 hour  Intake 440 ml  Output 0 ml  Net 440 ml   Filed Weights   10/17/23 0936 10/17/23 1802  Weight: 93.4 kg 100.6 kg    Exam:  General:  Appears calm and comfortable and is in NAD, on 2L  O2 - removed without difficulty but sats dropped into the high 80s a short time later Eyes:   normal lids, iris ENT:  grossly normal hearing, lips & tongue, mmm Cardiovascular:  RRR. No LE edema.  Respiratory:   CTA bilaterally with no wheezes/rales/rhonchi.  Normal to mildly increased respiratory effort. Abdomen:  soft, NT, ND Back:   wound with sutures in place, looks C/D/I; small bandage  where drain was with scant drainage Skin:  no rash or induration seen on limited exam Musculoskeletal:  grossly normal tone BUE/BLE, no bony abnormality Psychiatric:  blunted mood and affect, speech fluent and appropriate, AOx3 Neurologic:  CN 2-12 grossly intact, moves all extremities in coordinated fashion  Data Reviewed: I have reviewed the patient's lab results since admission.  Pertinent labs for today include:   Glucose 140 WBC 12.8, significantly improved    Family Communication: Husband was present  Thank you very much for involving us  in the care of your patient.  Author: Lorita Rosa, MD 10/21/2023 7:57 AM  For on call review [email or web address].

## 2023-10-21 NOTE — Progress Notes (Signed)
 Attending Progress Note  History: Victoria [name] is here for L4-5 minimally invasive transforaminal lumbar interbody fusion, they are currently 4 Days Post-Op.   POD4: Pt experienced urinary retention yesterday requiring catheterization overnight. She is complaining of pain all over and requesting an increase in her Gabapentin . She did have a BM overnight  POD3: She continues to have some headache and nausea. Has not had a BM in 6 days  POD2: Pt complaining of a headache with changes in position this morning and back pain as well as pain and soreness in her right groin and anterior thighs. SOB overnight has improved some this morning  POD1: She is having some anterior bilateral thigh soreness, significantly improved from overnight.  In nondermatomal distribution.  No new numbness weakness or tingling.  No headaches.  No issues with the drain site overnight.  Physical Exam: Vitals:   10/20/23 2120 10/21/23 0332  BP: 138/71 (!) 150/72  Pulse: 86 93  Resp: 18 18  Temp: 98.5 F (36.9 C) 98.2 F (36.8 C)  SpO2: 95% 93%   I/O last 3 completed shifts: In: 682.9 [P.O.:240; I.V.:3; IV Piggyback:439.9] Out: 600 [Urine:600] No intake/output data recorded.  Output by Drain (mL) 10/19/23 0701 - 10/19/23 1900 10/19/23 1901 - 10/20/23 0700 10/20/23 0701 - 10/20/23 1900 10/20/23 1901 - 10/21/23 0700 10/21/23 0701 - 10/21/23 0726  Lumbar Drain 10        AA Ox3 CNI  Strength: 5/5 throughout BLE   Incisions are c/d/I  Abdomen is distended by non rigid and non TTP  Data:  Recent Labs  Lab 10/19/23 0939 10/20/23 0832 10/21/23 0220  NA 133* 137 139  K 3.7 4.3 3.9  CL 99 101 105  CO2 22 27 26   BUN 14 19 27*  CREATININE 0.90 0.87 0.87  GLUCOSE 211* 176* 140*  CALCIUM 8.5* 8.9 8.8*   No results for input(s): "AST", "ALT", "ALKPHOS" in the last 168 hours.  Invalid input(s): "TBILI"   Recent Labs  Lab 10/19/23 0939 10/20/23 0832 10/21/23 0220  WBC 20.8* 21.5* 12.8*  HGB 13.3  13.7 12.5  HCT 38.9 40.8 38.1  PLT 226 235 197   No results for input(s): "APTT", "INR" in the last 168 hours.      No results found.   Other tests/results: None  Assessment/Plan:  [name] is currently 4 Days Post-Op, from MIS TLIF.  - continue to monitor dressing.  - medicine consulted with SOB. CTA concerning for atelectasis va pneumonia started cefepime and flagyl.  - Escalated bowel regimen yesterday had a small BM yesterday. Encouraged her to finish mag citrate at bedside - continue Fioricet for headache  Anastacio Karvonen PA-C Department of Neurosurgery

## 2023-10-21 NOTE — TOC Initial Note (Signed)
 Transition of Care [facility]) - Initial/Assessment Note    Patient Details  Name: Victoria [name] MRN: [id number] Date of Birth: 21-Jan-1961  [facility]) CM/SW Contact:    Alexandra Ice, RN Phone Number: 10/21/2023, 11:37 AM  Clinical Narrative:                 Contacted patient via phone, spoke with Therese Flash, spouse. Discussed discharge plan, home with home health services. Agreeable to services and stated she will need DME, RW and BSC. He has no preference on agencies to use. Sent message to [place]  with [facility] and sent DME orders to Jon with [employer] for processing. Received response from [place]  they are able to accept patient. Added [facility] information to AVS.   Expected Discharge Plan: Home w Home Health Services Barriers to Discharge: Continued Medical Work up   Patient Goals and CMS Choice Patient states their goals for this hospitalization and ongoing recovery are:: get better CMS Medicare.gov Compare Post Acute Care list provided to:: Patient Choice offered to / list presented to : Patient, Spouse      Expected Discharge Plan and Services   Discharge Planning Services: CM Consult Post Acute Care Choice: Durable Medical Equipment, Home Health Living arrangements for the past 2 months: Single Family Home                 DME Arranged: Walker rolling, Bedside commode DME Agency: [employer] Date DME Agency Contacted: 10/21/23 Time DME Agency Contacted: 1136 Representative spoke with at DME Agency: Sam Creighton HH Arranged: PT, OT HH Agency: [facility] Date [facility] Agency Contacted: 10/21/23 Time HH Agency Contacted: 1137 Representative spoke with at [facility] Agency: [place]   Prior Living Arrangements/Services Living arrangements for the past 2 months: Single Family Home Lives with:: Spouse Patient language and need for interpreter reviewed:: Yes Do you feel safe going back to the place where you live?: Yes      Need for Family Participation in  Patient Care: Yes (Comment) Care giver support system in place?: Yes (comment) Current home services: DME (cane) Criminal Activity/Legal Involvement Pertinent to Current Situation/Hospitalization: No - Comment as needed  Activities of Daily Living   ADL Screening (condition at time of admission) Independently performs ADLs?: Yes (appropriate for developmental age) Is the patient deaf or have difficulty hearing?: No Does the patient have difficulty seeing, even when wearing glasses/contacts?: No Does the patient have difficulty concentrating, remembering, or making decisions?: No  Permission Sought/Granted                  Emotional Assessment Appearance:: Appears stated age Attitude/Demeanor/Rapport: Engaged Affect (typically observed): Accepting Orientation: : Oriented to Self, Oriented to Place, Oriented to  Time, Oriented to Situation Alcohol  / Substance Use: Not Applicable Psych Involvement: No (comment)  Admission diagnosis:  Spondylolisthesis of lumbar region [M43.16] Chronic bilateral low back pain with bilateral sciatica [M54.42, M54.41, G89.29] Spinal instability, lumbar [M53.2X6] Spondylolisthesis [M43.10] Patient Active Problem List   Diagnosis Date Noted   Acute respiratory failure with hypoxia (HCC) 10/18/2023   Obesity (BMI 30-39.9) 10/18/2023   Chronic bilateral low back pain with bilateral sciatica 10/17/2023   Spinal instability, lumbar 10/17/2023   Postoperative CSF leak 10/17/2023   Spondylolisthesis 10/17/2023   Nausea and vomiting 09/20/2022   At low risk for fall 06/16/2022   Hyperlipidemia 09/29/2021   OSA (obstructive sleep apnea) 09/29/2021   Vitamin D  deficiency 09/29/2021   Long term (current) use of opiate analgesic 04/06/2021   Issue of  repeat prescription for medication 04/06/2021   SVT (supraventricular tachycardia) (HCC) 04/09/2020   Vaginal atrophy 04/09/2020   Class 1 obesity due to excess calories with serious comorbidity and body  mass index (BMI) of 31.0 to 31.9 in adult 02/07/2020   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   Chronic fatigue 01/07/2020   History of melanoma 01/07/2020   Endometriosis 01/07/2020   Pulmonary fibrosis (HCC) 12/12/2019   Nonalcoholic steatohepatitis 12/12/2019   Convulsions (HCC) 12/12/2019   Irritable bowel syndrome 12/12/2019   Osteopenia 12/12/2019   Pernicious anemia 12/12/2019   Prediabetes 12/12/2019   Traumatic brain injury (HCC) 12/12/2019   Psychosis (HCC) 11/15/2019   Chronic pain syndrome 11/15/2019   Corneal abrasion 11/15/2019   Suicidal thoughts 11/15/2019   MDD (major depressive disorder), single episode, severe with psychosis (HCC) 11/14/2019   Migraine 07/30/2019   Stage 3a chronic kidney disease (HCC) 07/25/2019   Spinal stenosis of lumbar region with neurogenic claudication 07/17/2019   Skin lesion 01/02/2019   Spondylolisthesis of lumbar region 04/24/2018   Numbness and tingling in both hands 12/30/2017   Cervical radiculopathy 10/25/2017   Pain medication agreement 10/25/2017   Sacroiliitis (HCC) 05/03/2016   Neuropathy 01/06/2016   Right hip pain 01/06/2016   MDD (major depressive disorder), recurrent, severe, with psychosis (HCC)    Osteoarthritis of both hips 07/06/2015   Dyslipidemia 07/06/2015   Hypertension 07/06/2015   GERD (gastroesophageal reflux disease) 07/06/2015   Asthma 07/06/2015   Bipolar I disorder, most recent episode manic, severe with psychotic features (HCC) 07/06/2015   Attention-deficit hyperactivity disorder, predominantly inattentive type 02/25/2014   Elevation of levels of liver transaminase levels 01/02/2014   Impaired fasting glucose 01/02/2014   Melanosis coli 09/20/2012   Screening for colon cancer 09/07/2012   Lactose intolerance 03/06/2012   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   Mild persistent asthma 03/26/2009   Acquired absence of other genital organ(s) 10/30/2008   Hx of total hysterectomy 10/30/2008   Lateral  epicondylitis, unspecified elbow 08/03/2006   Tinnitus, unspecified ear 08/11/2004   PCP:  Rockney Cid, DO Pharmacy:   [facility] [phone] - [place],  - [address] AT [facility] [address] [place] [address] Phone: [phone] Fax: [phone]     Social Drivers of Health (SDOH) Social History: SDOH Screenings   Food Insecurity: Patient Declined (10/19/2023)  Housing: Unknown (10/19/2023)  Transportation Needs: No Transportation Needs (10/19/2023)  Utilities: Not At Risk (10/19/2023)  Alcohol  Screen: Low Risk  (08/25/2023)  Depression (PHQ2-9): Low Risk  (09/27/2023)  Financial Resource Strain: Low Risk  (08/25/2023)  Physical Activity: Insufficiently Active (08/25/2023)  Social Connections: Moderately Isolated (08/25/2023)  Stress: No Stress Concern Present (08/25/2023)  Tobacco Use: Medium Risk (10/17/2023)  Health Literacy: Adequate Health Literacy (08/25/2023)   SDOH Interventions: Food Insecurity Interventions: Intervention Not Indicated Housing Interventions: Patient Declined Transportation Interventions: Intervention Not Indicated Utilities Interventions: Intervention Not Indicated   Readmission Risk Interventions     No data to display

## 2023-10-21 NOTE — Progress Notes (Signed)
 Occupational Therapy Treatment Patient Details Name: Victoria [name] MRN: [id number] DOB: [birth year] Today's Date: 10/21/2023   History of present illness Victoria [name] is a 63yoF who comes to [facility] for elective lumbar procedure to address ongoing spondylosis, immobilizing pain. PMH of ILD, spondylolisthesis, chronic low back pain, hypertension, hyperlipidemia, prediabetes, GERD, bipolar, Lyme's disease, OSA not using CPAP. Pt developed postoperative SOB, hypoxia, transfered to ICU, hospitalzist services consulted.CTA negative for PE, but showed some opacity.   OT comments  Pt seen for OT tx, very limited by pt's fatigue/lethargy. Pt unable to maintain alertness more than a minute before drifting back off to sleep. SpO2 noted to be 86% on room air. 2L applied and improved to >91%, RN notified. Caregiver education provided, reviewed OT goals. Will continue to progress as able.       If plan is discharge home, recommend the following:  A little help with walking and/or transfers;A lot of help with bathing/dressing/bathroom;Assistance with cooking/housework;Assist for transportation;Help with stairs or ramp for entrance   Equipment Recommendations  None recommended by OT    Recommendations for Other Services      Precautions / Restrictions Precautions Precautions: Fall;Back Restrictions Weight Bearing Restrictions Per Provider Order: No       Mobility Bed Mobility    Deferred 2/2 fatigue/lethargy                Transfers    Deferred 2/2 fatigue/lethargy                         ADL either performed or assessed with clinical judgement   ADL  Deferred 2/2 fatigue/lethargy             Cognition Arousal: Lethargic   Cognition: Difficult to assess Difficult to assess due to: Level of arousal           OT - Cognition Comments: Pt briefly alerts but does not fully wake, reports not sleeping well and spouse agrees, spouse reporting some mild confusion  citing lack of sleep as cause - RN notified                 Following commands: Impaired        Cueing      Exercises Other Exercises Other Exercises: Caregiver education provided       General Comments SpO2 noted to be 86% on room air, 2L applied and improved to >91%, RN notified.    Pertinent Vitals/ Pain       Pain Assessment Pain Location: Pt lethargic, grimaces when briefly alerts Pain Descriptors / Indicators: Grimacing Pain Intervention(s): Patient requesting pain meds-RN notified (per spouse request)   Frequency  Min 2X/week        Progress Toward Goals  OT Goals(current goals can now be found in the care plan section)  Progress towards OT goals: OT to reassess next treatment  Acute Rehab OT Goals Patient Stated Goal: go home OT Goal Formulation: With patient/family Time For Goal Achievement: 11/03/23 Potential to Achieve Goals: Good  Plan         AM-PAC OT "6 Clicks" Daily Activity     Outcome Measure   Help from another person eating meals?: None Help from another person taking care of personal grooming?: A Little Help from another person toileting, which includes using toliet, bedpan, or urinal?: A Little Help from another person bathing (including washing, rinsing, drying)?: A Little Help from another person to put on and taking off regular upper  body clothing?: A Little Help from another person to put on and taking off regular lower body clothing?: A Little 6 Click Score: 19    End of Session Equipment Utilized During Treatment: Oxygen  OT Visit Diagnosis: Other abnormalities of gait and mobility (R26.89);Pain   Activity Tolerance Patient limited by fatigue;Patient limited by lethargy   Patient Left in bed;with call bell/phone within reach;with bed alarm set;with family/visitor present   Nurse Communication Patient requests pain meds;Other (comment) (lethargic, SpO2)        Time: 7829-5621 OT Time Calculation (min): 12  min  Charges: OT General Charges $OT Visit: 1 Visit OT Treatments $Therapeutic Activity: 8-22 mins  Berenda Breaker., MPH, MS, OTR/L ascom [phone] 10/21/23, 12:38 PM

## 2023-10-21 NOTE — Telephone Encounter (Signed)
 Sninsky's message:  Please schedule 1 to 2-week follow-up for voiding trial, morning appointment with me in p.m. appointment with PA.  Can be in an 8:15 AM spot

## 2023-10-21 NOTE — Progress Notes (Signed)
 Physical Therapy Treatment Patient Details Name: Victoria [name] MRN: [id number] DOB: 1961-01-19 Today's Date: 10/21/2023   History of Present Illness Victoria [name] is a 63yoF who comes to [facility] for elective lumbar procedure to address ongoing spondylosis, immobilizing pain. PMH of ILD, spondylolisthesis, chronic low back pain, hypertension, hyperlipidemia, prediabetes, GERD, bipolar, Lyme's disease, OSA not using CPAP. Pt developed postoperative SOB, hypoxia, transfered to ICU, hospitalzist services consulted.CTA negative for PE, but showed some opacity.    PT Comments  Pt received in bed with 2L O2, lying in Right side lying position with SpO2 at 89%. Pt assisted to EOB, educated on importance of upright sitting and progressive mobility. Able to place pt on RA once sitting with positional change improvement. Continued loose productive cough, educated on pulmonary toileting, proper use of IS and Acapella (issued to pt) with good results. SpO2 remained <95% throughout session on RA during mobility and gait training in room with RW. Pt encouraged to continue pulmonary toileting, increase OOB duration, and progressive mobility. Will continue to progress in am.    If plan is discharge home, recommend the following: A little help with walking and/or transfers;Direct supervision/assist for medications management;Assist for transportation;Assistance with cooking/housework;Help with stairs or ramp for entrance   Can travel by private vehicle        Equipment Recommendations  Rolling walker (2 wheels);BSC/3in1    Recommendations for Other Services       Precautions / Restrictions Precautions Precautions: Fall;Back Precaution Booklet Issued: Yes (comment) Recall of Precautions/Restrictions: Intact Restrictions Weight Bearing Restrictions Per Provider Order: No     Mobility  Bed Mobility Overal bed mobility: Needs Assistance Bed Mobility: Sit to Sidelying, Rolling   Sidelying to sit: Min  assist, HOB elevated       General bed mobility comments: MIN A for BLE mgt    Transfers Overall transfer level: Needs assistance Equipment used: Rolling walker (2 wheels) Transfers: Sit to/from Stand Sit to Stand: Contact guard assist           General transfer comment: VC for hands placement    Ambulation/Gait Ambulation/Gait assistance: Supervision Gait Distance (Feet): 65 Feet Assistive device: Rolling walker (2 wheels) Gait Pattern/deviations: Step-through pattern Gait velocity: decr     General Gait Details: does well with RW, SpO2 96% upon exertion on RA   Stairs             Wheelchair Mobility     Tilt Bed    Modified Rankin (Stroke Patients Only)       Balance Overall balance assessment: Needs assistance Sitting-balance support: No upper extremity supported, Feet supported Sitting balance-Leahy Scale: Good     Standing balance support: Bilateral upper extremity supported, During functional activity, Reliant on assistive device for balance Standing balance-Leahy Scale: Fair                              Hotel manager: No apparent difficulties  Cognition Arousal: Alert Behavior During Therapy: WFL for tasks assessed/performed   PT - Cognitive impairments: No apparent impairments                         Following commands: Intact      Cueing Cueing Techniques: Verbal cues  Exercises Other Exercises Other Exercises: Pt and spouse educated on safe transfers, back precautions, proper use of RW, importance of OOB activity. Other Exercises: Education on pulmonary toileting with IS and issued  acapella to assist with airway clearance. Good teach back demonstration and use.    General Comments General comments (skin integrity, edema, etc.): Pt able to maintain SpO2 <95% throughout session on RA      Pertinent Vitals/Pain Pain Assessment Pain Assessment: Faces Faces Pain Scale: Hurts  little more Pain Location: Incisional site Pain Descriptors / Indicators: Grimacing Pain Intervention(s): Premedicated before session    Home Living                          Prior Function            PT Goals (current goals can now be found in the care plan section) Acute Rehab PT Goals Patient Stated Goal: regain ability to advance AMB without a RW Progress towards PT goals: Progressing toward goals    Frequency    7X/week      PT Plan      Co-evaluation              AM-PAC PT "6 Clicks" Mobility   Outcome Measure  Help needed turning from your back to your side while in a flat bed without using bedrails?: A Lot Help needed moving from lying on your back to sitting on the side of a flat bed without using bedrails?: A Lot Help needed moving to and from a bed to a chair (including a wheelchair)?: A Lot Help needed standing up from a chair using your arms (e.g., wheelchair or bedside chair)?: A Lot Help needed to walk in hospital room?: A Little Help needed climbing 3-5 steps with a railing? : A Little 6 Click Score: 14    End of Session Equipment Utilized During Treatment: Gait belt Activity Tolerance: Patient tolerated treatment well;No increased pain Patient left: in chair;with nursing/sitter in room;with family/visitor present Nurse Communication: Mobility status PT Visit Diagnosis: Difficulty in walking, not elsewhere classified (R26.2);Other abnormalities of gait and mobility (R26.89)     Time: 1610-9604 PT Time Calculation (min) (ACUTE ONLY): 35 min  Charges:    $Gait Training: 8-22 mins $Therapeutic Activity: 8-22 mins PT General Charges $$ ACUTE PT VISIT: 1 Visit                    Melvyn Stagers, PTA  Diona Franklin 10/21/2023, 4:04 PM

## 2023-10-21 NOTE — Discharge Summary (Signed)
 Discharge Summary  Patient ID: Victoria [name] MRN: [id number] DOB/AGE: February 10, 1961 63 y.o.  Admit date: 10/17/2023 Discharge date: 10/22/2023  Admission Diagnoses: Principal Problem:   Spondylolisthesis Active Problems:   Hypertension   GERD (gastroesophageal reflux disease)   Asthma   Bipolar I disorder, most recent episode manic, severe with psychotic features (HCC)   Pulmonary fibrosis (HCC)   Chronic bilateral low back pain with bilateral sciatica   Spinal instability, lumbar   Postoperative CSF leak   Acute respiratory failure with hypoxia (HCC)   Obesity (BMI 30-39.9) Discharge Diagnoses:  Principal Problem:   Spondylolisthesis Active Problems:   Hypertension   GERD (gastroesophageal reflux disease)   Asthma   Bipolar I disorder, most recent episode manic, severe with psychotic features (HCC)   Pulmonary fibrosis (HCC)   Chronic bilateral low back pain with bilateral sciatica   Spinal instability, lumbar   Postoperative CSF leak   Acute respiratory failure with hypoxia (HCC)   Obesity (BMI 30-39.9)   Discharged Condition: good  Hospital Course: Ms. Upshur was admitted for surgical intervention.  She tolerated the procedure well.  She had a spinal fluid leak which required placement of a lumbar drain.  She tolerated this well.  She is on bedrest for 2 days.  She then began mobilization.  She developed pneumonia and postoperative respiratory failure.  Medicine was consulted and assisted with managing this condition.  On the day of discharge, she was doing much better and was stable for discharge.  Consults: medicine  Significant Diagnostic Studies: radiology: X-Ray: correct placement of implants  Treatments: surgery: L4/5 TLIF  Discharge Exam: Blood pressure (!) 162/75, pulse 77, temperature 98 F (36.7 C), temperature source Oral, resp. rate 16, height 5' 4.5" (1.638 m), weight 100.6 kg, SpO2 92%. General appearance: alert and cooperative Doing well, MAEW,  walking Incisions c/d/i  Disposition: Discharge disposition: 01-Home or Self Care       Discharge Instructions     Discharge patient   Complete by: As directed    Discharge disposition: 01-Home or Self Care   Discharge patient date: 10/22/2023   Face-to-face encounter (required for Medicare/Medicaid patients)   Complete by: As directed    I Lorita Rosa certify that this patient is under my care and that I, or a nurse practitioner or physician's assistant working with me, had a face-to-face encounter that meets the physician face-to-face encounter requirements with this patient on 10/21/2023. The encounter with the patient was in whole, or in part for the following medical condition(s) which is the primary reason for home health care (List medical condition): lumbar fusion   The encounter with the patient was in whole, or in part, for the following medical condition, which is the primary reason for home health care: lumbar fusion   I certify that, based on my findings, the following services are medically necessary home health services: Physical therapy   Reason for Medically Necessary Home Health Services: Therapy- Therapeutic Exercises to Increase Strength and Endurance   My clinical findings support the need for the above services: Pain interferes with ambulation/mobility   Further, I certify that my clinical findings support that this patient is homebound due to: Pain interferes with ambulation/mobility   For home use only DME 3 n 1   Complete by: As directed    For home use only DME Walker rolling   Complete by: As directed    Walker: With 5 Inch Wheels   Patient needs a walker to treat with the following condition:  S/P lumbar fusion   Home Health   Complete by: As directed    To provide the following care/treatments:  PT OT     Incentive spirometry RT   Complete by: As directed       Allergies as of 10/22/2023       Reactions   Acetaminophen  Other (See Comments)    Liver issues   Atorvastatin    Myalgias    Sertraline Hives, Shortness Of Breath   Cefuroxime Hives   Has tolerated 1st generation cephalosporin (CEPHALEXIN) in the past with no documented ADRs.    Codeine Hives   Tape    Adhesive tape    Amoxicillin Rash   Penicillins Rash   Has tolerated 1st generation cephalosporin (CEPHALEXIN) in the past with no documented ADRs.    Sulfa Antibiotics Rash        Medication List     STOP taking these medications    aspirin 325 MG tablet   gabapentin  800 MG tablet Commonly known as: NEURONTIN  Replaced by: gabapentin  300 MG capsule   Nebulizer/Tubing/Mouthpiece Kit       TAKE these medications    albuterol  108 (90 Base) MCG/ACT inhaler Commonly known as: VENTOLIN  HFA Inhale 1-2 puffs into the lungs every 6 (six) hours as needed for wheezing or shortness of breath.   B-COMPLEX/B-12 PO Take by mouth.   baclofen  10 MG tablet Commonly known as: LIORESAL  Take 10 mg by mouth 3 (three) times daily.   benzonatate  100 MG capsule Commonly known as: Tessalon  Perles Take 1 capsule (100 mg total) by mouth every 6 (six) hours as needed for cough.   benzonatate  100 MG capsule Commonly known as: TESSALON  Take 1 capsule (100 mg total) by mouth 2 (two) times daily as needed for cough.   Breo Ellipta  200-25 MCG/ACT Aepb Generic drug: fluticasone  furoate-vilanterol Inhale 1 puff into the lungs daily.   budesonide -formoterol  160-4.5 MCG/ACT inhaler Commonly known as: SYMBICORT  Inhale 2 puffs into the lungs 2 (two) times daily.   butalbital-acetaminophen -caffeine 50-325-40 MG tablet Commonly known as: FIORICET Take 1 tablet by mouth every 6 (six) hours as needed for up to 3 days for headache.   DULoxetine  30 MG capsule Commonly known as: CYMBALTA  Take 30 mg by mouth 2 (two) times daily.   gabapentin  300 MG capsule Commonly known as: NEURONTIN  Take 3 capsules (900 mg total) by mouth 3 (three) times daily. Replaces: gabapentin  800 MG  tablet   ipratropium-albuterol  0.5-2.5 (3) MG/3ML Soln Commonly known as: DUONEB Take 3 mLs by nebulization 3 (three) times daily as needed (cough wheeze SOB bronchitis/asthma symptoms).   ketoconazole  2 % cream Commonly known as: NIZORAL  Apply 1 Application topically daily.   levofloxacin 750 MG tablet Commonly known as: LEVAQUIN Take 1 tablet (750 mg total) by mouth daily. Start taking on: Oct 23, 2023   losartan 25 MG tablet Commonly known as: COZAAR Take 1 tablet (25 mg total) by mouth daily. Start taking on: Oct 23, 2023   methocarbamol 500 MG tablet Commonly known as: ROBAXIN Take 1 tablet (500 mg total) by mouth every 6 (six) hours as needed for muscle spasms.   miconazole kit Commonly known as: MONISTAT 1 COMBINATION PACK Place 1 each vaginally once.   Movantik  25 MG Tabs tablet Generic drug: naloxegol  oxalate Take 25 mg by mouth daily as needed (Constipation).   multivitamin with minerals tablet Take 1 tablet by mouth daily.   ondansetron  8 MG tablet Commonly known as: ZOFRAN  Take 1 tablet (8  mg total) by mouth every 8 (eight) hours as needed for nausea or vomiting. What changed: how much to take   Oxycodone  HCl 10 MG Tabs Take 1 tablet (10 mg total) by mouth every 4 (four) hours as needed for up to 7 days for severe pain (pain score 7-10). What changed:  when to take this reasons to take this   pantoprazole  40 MG tablet Commonly known as: PROTONIX  TAKE 1 TABLET BY MOUTH EVERY DAY IN THE MORNING   polyvinyl alcohol  1.4 % ophthalmic solution Commonly known as: LIQUIFILM TEARS Place 1 drop into both eyes 3 (three) times daily. What changed:  when to take this reasons to take this   Premarin vaginal cream Generic drug: conjugated estrogens Place 1 applicator vaginally once a week.   tamsulosin 0.4 MG Caps capsule Commonly known as: FLOMAX Take 1 capsule (0.4 mg total) by mouth daily after supper.   traZODone  50 MG tablet Commonly known as:  DESYREL  Take 50 mg by mouth at bedtime.   vitamin D3 25 MCG tablet Commonly known as: CHOLECALCIFEROL  Take 1 tablet (1,000 Units total) by mouth daily.               Durable Medical Equipment  (From admission, onward)           Start     Ordered   10/22/23 0000  For home use only DME Walker rolling       Question Answer Comment  Walker: With 5 Inch Wheels   Patient needs a walker to treat with the following condition S/P lumbar fusion      10/22/23 1138   10/22/23 0000  For home use only DME 3 n 1        10/22/23 1138            Follow-up Information     Lucetta Russel, PA-C Follow up on 10/31/2023.   Specialty: Neurosurgery Why: 230 pm Contact information: [address] [place] [address] [phone]                 Signed: Jodeen Munch 10/22/2023, 11:39 AM

## 2023-10-21 NOTE — Plan of Care (Signed)
  Problem: Clinical Measurements: Goal: Ability to maintain clinical measurements within normal limits will improve Outcome: Progressing Goal: Will remain free from infection Outcome: Progressing Goal: Diagnostic test results will improve Outcome: Progressing   Problem: Activity: Goal: Risk for activity intolerance will decrease Outcome: Progressing   Problem: Elimination: Goal: Will not experience complications related to bowel motility Outcome: Progressing Goal: Will not experience complications related to urinary retention Outcome: Progressing

## 2023-10-21 NOTE — Progress Notes (Signed)
 Patient is not able to walk the distance required to go the bathroom, or he/she is unable to safely negotiate stairs required to access the bathroom.  A 3in1 BSC will alleviate this problem

## 2023-10-22 DIAGNOSIS — M4316 Spondylolisthesis, lumbar region: Secondary | ICD-10-CM | POA: Diagnosis not present

## 2023-10-22 LAB — CBC WITH DIFFERENTIAL/PLATELET
Abs Immature Granulocytes: 0.08 10*3/uL — ABNORMAL HIGH (ref 0.00–0.07)
Basophils Absolute: 0 10*3/uL (ref 0.0–0.1)
Basophils Relative: 0 %
Eosinophils Absolute: 0.1 10*3/uL (ref 0.0–0.5)
Eosinophils Relative: 1 %
HCT: 39 % (ref 36.0–46.0)
Hemoglobin: 13.1 g/dL (ref 12.0–15.0)
Immature Granulocytes: 1 %
Lymphocytes Relative: 20 %
Lymphs Abs: 1.8 10*3/uL (ref 0.7–4.0)
MCH: 30.9 pg (ref 26.0–34.0)
MCHC: 33.6 g/dL (ref 30.0–36.0)
MCV: 92 fL (ref 80.0–100.0)
Monocytes Absolute: 0.7 10*3/uL (ref 0.1–1.0)
Monocytes Relative: 7 %
Neutro Abs: 6.6 10*3/uL (ref 1.7–7.7)
Neutrophils Relative %: 71 %
Platelets: 214 10*3/uL (ref 150–400)
RBC: 4.24 MIL/uL (ref 3.87–5.11)
RDW: 14.6 % (ref 11.5–15.5)
WBC: 9.3 10*3/uL (ref 4.0–10.5)
nRBC: 0 % (ref 0.0–0.2)

## 2023-10-22 LAB — BASIC METABOLIC PANEL WITH GFR
Anion gap: 11 (ref 5–15)
BUN: 20 mg/dL (ref 8–23)
CO2: 24 mmol/L (ref 22–32)
Calcium: 8.6 mg/dL — ABNORMAL LOW (ref 8.9–10.3)
Chloride: 100 mmol/L (ref 98–111)
Creatinine, Ser: 0.91 mg/dL (ref 0.44–1.00)
GFR, Estimated: >60 mL/min (ref >60)
Glucose, Bld: 167 mg/dL — ABNORMAL HIGH (ref 70–99)
Potassium: 4 mmol/L (ref 3.5–5.1)
Sodium: 135 mmol/L (ref 135–145)

## 2023-10-22 LAB — GLUCOSE, CAPILLARY
Glucose-Capillary: 128 mg/dL — ABNORMAL HIGH (ref 70–99)
Glucose-Capillary: 143 mg/dL — ABNORMAL HIGH (ref 70–99)

## 2023-10-22 MED ORDER — LOSARTAN POTASSIUM 25 MG PO TABS: 25.0000 mg | ORAL_TABLET | Freq: Every day | ORAL | 0 refills | Status: DC

## 2023-10-22 MED ORDER — LEVOFLOXACIN 750 MG PO TABS
750.0000 mg | ORAL_TABLET | Freq: Every day | ORAL | Status: DC
  Administered 2023-10-22: 750 mg via ORAL
  Filled 2023-10-22: qty 1

## 2023-10-22 MED ORDER — TAMSULOSIN HCL 0.4 MG PO CAPS: 0.4000 mg | ORAL_CAPSULE | Freq: Every day | ORAL | 0 refills | Status: DC

## 2023-10-22 MED ORDER — METHOCARBAMOL 500 MG PO TABS: 500.0000 mg | ORAL_TABLET | Freq: Four times a day (QID) | ORAL | 0 refills | Status: DC | PRN

## 2023-10-22 MED ORDER — BENZONATATE 100 MG PO CAPS: 100.0000 mg | ORAL_CAPSULE | Freq: Four times a day (QID) | ORAL | 0 refills | Status: DC | PRN

## 2023-10-22 MED ORDER — LEVOFLOXACIN 750 MG PO TABS: 750.0000 mg | ORAL_TABLET | Freq: Every day | ORAL | 0 refills | Status: DC

## 2023-10-22 MED ORDER — BENZONATATE 100 MG PO CAPS: 100.0000 mg | ORAL_CAPSULE | Freq: Two times a day (BID) | ORAL | 0 refills | Status: DC | PRN

## 2023-10-22 MED ORDER — GABAPENTIN 300 MG PO CAPS: 900.0000 mg | ORAL_CAPSULE | Freq: Three times a day (TID) | ORAL | 0 refills | Status: DC

## 2023-10-22 MED ORDER — BUTALBITAL-APAP-CAFFEINE 50-325-40 MG PO TABS: 1.0000 | ORAL_TABLET | Freq: Four times a day (QID) | ORAL | 0 refills | Status: DC | PRN

## 2023-10-22 MED ORDER — OXYCODONE HCL 10 MG PO TABS: 10.0000 mg | ORAL_TABLET | ORAL | 0 refills | Status: AC | PRN

## 2023-10-22 NOTE — Progress Notes (Signed)
 Patient discharged home at this time. Writer reviewed homecare instructions with patient/spouse. No further questions asked. Patient has BSC/FWW that has been delivered to room. Patient has all belongings, and transported by staff via wheelchair to front lobby. Spouse driving patient home.

## 2023-10-22 NOTE — Consult Note (Signed)
 Initial Consultation Note   Patient: Victoria [name] ZOX:[id number] DOB: 03/02/1961 PCP: Rockney Cid, DO DOA: 10/17/2023 DOS: the patient was seen and examined on 10/22/2023 Primary service: Jodeen Munch, MD  Referring physician: Mont Antis Reason for consult: SOB, hypoxia   Assessment and Plan:  Spondylolisthesis and chronic bilateral low back pain with bilateral sciatica and spinal instability, lumbar s/p of L4-5 minimally invasive transforaminal lumbar interbody fusion on 5/19 Pain control per primary neurosurgical team Baclofen  10 mg 3 times daily Continue gabapentin  As needed oxycodone    Postoperative CSF leak Drain was pulled yesterday Management per primary neurosurgical team   Acute respiratory failure with hypoxia in the setting of hx of ILD and asthma 7L new oxygen requirement, currently on RA CT negative PE, but showed posterior upper and lower lobe airspace opacities bilaterally, right greater than left No fever, but has leukocytosis, concern for possible aspiration pneumonia Patient does not have history of CHF, given 1 dose of Lasix 20 mg On cefepime and Flagyl -> Levaquin for 7 total days Sputum culture Started Solu-Medrol  125 mg x 1, then 80 mg BID; she refused additional steroids because they make her hyper Bronchodilators and as needed Mucinex Appears to be stable for discharge at this time   Acute urinary retention Post-operative, possibly related to anesthesia No other evidence of weakness so minimal concern that this is related to her surgery I&O -> foley Started on tamsulosin Will need outpatient voiding trial - Dr. Estanislao Heimlich to arrange   Hypertension IV hydralazine as needed Patient is not taking medications at home BP currently 160/73 with range 125/64-170/81 Will add losartan (DM) 25 mg daily   DM Recent A1c was 6.6 Not on home medications Diet/exercise trial for now Carb modified diet    GERD (gastroesophageal reflux  disease) Continue Protonix    Bipolar I disorder, most recent episode manic, severe with psychotic features  Continue Cymbalta , trazodone    Class 2 Obesity  Body mass index is 37.48 kg/m.Aaron Aas  Weight loss should be encouraged Outpatient PCP/bariatric medicine f/u encouraged Significantly low or high BMI is associated with higher medical risk including morbidity and mortality        Patient is being discharged today.  HPI: Victoria [name] is a 62 y.o. female with past medical history of  ILD, HTN, HLD, preDM, bipolar d/o, and OSA not on CPAP who underwent L4-5 lumbar fusion on 5/19.  She was SOB with O2 87% on room air on 5/20.  Concern for aspiration PNA, started on Cefepime and metronidazole.  Continues to improve from a respiratory standpoint, now on RA.  Had a BM yesterday.  Had  urinary retention; started on tamsulosin and foley placed.  She is very eager to go home and no longer requires O2 supplementation.    Review of Systems: As mentioned in the history of present illness. All other systems reviewed and are negative. Past Medical History:  Diagnosis Date   Asthma    Cervical spinal stenosis    Complication of anesthesia    Dyspnea    Dysrhythmia    Fatty liver    GERD (gastroesophageal reflux disease)    Hypercholesterolemia    Hypertension    Lumbar stenosis    Lyme disease    Melanocarcinoma (HCC)    Osteoarthritis of both hips    Pre-diabetes    Psychotic disorder (HCC)    Pulmonary fibrosis (HCC)    Sleep apnea    Past Surgical History:  Procedure Laterality Date   ABDOMINAL HYSTERECTOMY  APPENDECTOMY  2006   APPLICATION OF INTRAOPERATIVE CT SCAN N/A 10/17/2023   Procedure: APPLICATION OF INTRAOPERATIVE CT SCAN;  Surgeon: Jodeen Munch, MD;  Location: [facility] ORS;  Service: Neurosurgery;  Laterality: N/A;   BREAST EXCISIONAL BIOPSY Right 1997   neg   ESOPHAGOGASTRODUODENOSCOPY (EGD) WITH PROPOFOL  N/A 09/20/2022   Procedure: ESOPHAGOGASTRODUODENOSCOPY  (EGD) WITH PROPOFOL ;  Surgeon: Luke Salaam, MD;  Location: [facility] ENDOSCOPY;  Service: Gastroenterology;  Laterality: N/A;   excision of melanoma on Right arm     HIATAL HERNIA REPAIR     Jan 2017   LAPAROSCOPY  2020   TRANSFORAMINAL LUMBAR INTERBODY FUSION W/ MIS 1 LEVEL N/A 10/17/2023   Procedure: L4-5 MINIMALLY INVASIVE (MIS) TRANSFORAMINAL LUMBAR INTERBODY FUSION (TLIF);  Surgeon: Jodeen Munch, MD;  Location: [facility] ORS;  Service: Neurosurgery;  Laterality: N/A;  L4-5 MINIMALLY INVASIVE (MIS) TRANSFORAMINAL LUMBAR INTERBODY FUSION (TLIF)   Social History:  reports that she quit smoking about 38 years ago. Her smoking use included cigarettes. She has never used smokeless tobacco. She reports that she does not drink alcohol  and does not use drugs.  Allergies  Allergen Reactions   Acetaminophen  Other (See Comments)    Liver issues   Atorvastatin     Myalgias    Sertraline Hives and Shortness Of Breath   Cefuroxime Hives    Has tolerated 1st generation cephalosporin (CEPHALEXIN) in the past with no documented ADRs.    Codeine Hives   Tape     Adhesive tape    Amoxicillin Rash   Penicillins Rash    Has tolerated 1st generation cephalosporin (CEPHALEXIN) in the past with no documented ADRs.    Sulfa Antibiotics Rash    Family History  Problem Relation Age of Onset   Alcohol  abuse Mother    Arthritis Mother    Heart disease Mother    Liver disease Mother    Osteoporosis Mother    Clotting disorder Mother    Diabetes Father    Arthritis Father    Stroke Father    Sarcoidosis Father    Heart failure Father    Bipolar disorder Sister    Schizophrenia Sister    Depression Sister    Anxiety disorder Sister    Alcohol  abuse Sister    Diabetes Sister    Arthritis Sister    Thyroid  disease Sister    Lupus Sister    Depression Sister    Anxiety disorder Sister    Alcohol  abuse Sister    Depression Sister    Anxiety disorder Sister    Ehlers-Danlos syndrome Daughter      Prior to Admission medications   Medication Sig Start Date End Date Taking? Authorizing Provider  albuterol  (VENTOLIN  HFA) 108 (90 Base) MCG/ACT inhaler Inhale 1-2 puffs into the lungs every 6 (six) hours as needed for wheezing or shortness of breath. 12/08/22  Yes Tapia, Leisa, PA-C  aspirin 325 MG tablet Take 325 mg by mouth every 6 (six) hours as needed for moderate pain (pain score 4-6).   Yes [provider]  B Complex Vitamins (B-COMPLEX/B-12 PO) Take by mouth.   Yes [provider]  baclofen  (LIORESAL ) 10 MG tablet Take 10 mg by mouth 3 (three) times daily.   Yes [provider]  cholecalciferol  (VITAMIN D ) 25 MCG tablet Take 1 tablet (1,000 Units total) by mouth daily. 11/20/19  Yes Clapacs, Elida Grounds, MD  conjugated estrogens (PREMARIN) vaginal cream Place 1 applicator vaginally once a week. 04/09/20  Yes [provider]  DULoxetine  (CYMBALTA ) 30 MG capsule Take 30 mg by mouth 2 (two) times daily. 01/27/23 10/17/23 Yes [provider]  fluticasone  furoate-vilanterol (BREO ELLIPTA ) 200-25 MCG/ACT AEPB Inhale 1 puff into the lungs daily.   Yes [provider]  gabapentin  (NEURONTIN ) 800 MG tablet Take 800 mg by mouth 3 (three) times daily. 08/29/23 11/27/23 Yes [provider]  ipratropium-albuterol  (DUONEB) 0.5-2.5 (3) MG/3ML SOLN Take 3 mLs by nebulization 3 (three) times daily as needed (cough wheeze SOB bronchitis/asthma symptoms). 12/08/22  Yes Tapia, Leisa, PA-C  ketoconazole  (NIZORAL ) 2 % cream Apply 1 Application topically daily. 12/30/22  Yes Rockney Cid, DO  miconazole (MONISTAT 1 COMBINATION PACK) kit Place 1 each vaginally once.   Yes [provider]  MOVANTIK  25 MG TABS tablet Take 25 mg by mouth daily as needed (Constipation). 06/23/23  Yes [provider]  Multiple Vitamins-Minerals (MULTIVITAMIN WITH MINERALS) tablet Take 1 tablet by mouth daily.   Yes [provider]  ondansetron  (ZOFRAN )  8 MG tablet Take 1 tablet (8 mg total) by mouth every 8 (eight) hours as needed for nausea or vomiting. Patient taking differently: Take 4 mg by mouth every 8 (eight) hours as needed for nausea or vomiting. 06/18/22  Yes Rockney Cid, DO  Oxycodone  HCl 10 MG TABS Take 10 mg by mouth 3 (three) times daily.   Yes [provider]  pantoprazole  (PROTONIX ) 40 MG tablet TAKE 1 TABLET BY MOUTH EVERY DAY IN THE MORNING 08/18/23  Yes Rockney Cid, DO  polyvinyl alcohol  (LIQUIFILM TEARS) 1.4 % ophthalmic solution Place 1 drop into both eyes 3 (three) times daily. Patient taking differently: Place 1 drop into both eyes daily as needed for dry eyes. 11/19/19  Yes Clapacs, Elida Grounds, MD  traZODone  (DESYREL ) 50 MG tablet Take 50 mg by mouth at bedtime.   Yes [provider]  budesonide -formoterol  (SYMBICORT ) 160-4.5 MCG/ACT inhaler Inhale 2 puffs into the lungs 2 (two) times daily. 09/27/23   Rockney Cid, DO  Respiratory Therapy Supplies (NEBULIZER/TUBING/MOUTHPIECE) KIT Disp one nebulizer machine, tubing set and mouthpiece kit Patient not taking: Reported on 09/27/2023 12/08/22   Adeline Hone, PA-C    Physical Exam: Vitals:   10/21/23 1952 10/21/23 2000 10/22/23 0433 10/22/23 0744  BP:  (!) 145/77 (!) 164/74 (!) 162/75  Pulse:  82 83 77  Resp:  18 16 16   Temp:  98.2 F (36.8 C) 98.4 F (36.9 C) 98 F (36.7 C)  TempSrc:    Oral  SpO2: 93% 96% 95% 92%  Weight:      Height:         Intake/Output Summary (Last 24 hours) at 10/22/2023 0813 Last data filed at 10/22/2023 0600 Gross per 24 hour  Intake 1485.98 ml  Output 2575 ml  Net -1089.02 ml   Filed Weights   10/17/23 0936 10/17/23 1802  Weight: 93.4 kg 100.6 kg    Exam:  General:  Appears calm and comfortable and is in NAD, on RA Eyes:  normal lids, iris ENT:  grossly normal hearing, lips & tongue, mmm Cardiovascular:  RRR. No LE edema.  Respiratory:   CTA bilaterally with no wheezes/rales/rhonchi.  Normal to  mildly increased respiratory effort. Abdomen:  soft, NT, ND Back:   wound with sutures in place, looks C/D/I; small bandage where drain was with scant drainage Skin:  no rash or induration seen on limited exam Musculoskeletal:  grossly normal tone BUE/BLE, no bony abnormality Psychiatric:  blunted mood and affect, speech fluent and appropriate, AOx3  Neurologic:  CN 2-12 grossly intact, moves all extremities in coordinated fashion   Data Reviewed: I have reviewed the patient's lab results since admission.  Pertinent labs for today include:   Glucose 167 Normal CBC    Family Communication: None present Primary team communication: Discussed with Dr. Mont Antis via Secure Chat Thank you very much for involving us  in the care of your patient.  Author: Lorita Rosa, MD 10/22/2023 8:10 AM  For on call review [email or web address].

## 2023-10-22 NOTE — TOC Transition Note (Signed)
 Transition of Care [facility]) - Discharge Note   Patient Details  Name: Victoria [name] MRN: [id number] Date of Birth: [birth year]  Transition of [facility]) CM/SW Contact:  Alexandra Ice, RN Phone Number: 10/22/2023, 12:47 PM   Clinical Narrative:    Patient to discharge today, home with home health services. DME delivered by [employer] to bedside. Patient set up with [facility], information on AVS.    Final next level of care: Home w Home Health Services Barriers to Discharge: Barriers Resolved   Patient Goals and CMS Choice Patient states their goals for this hospitalization and ongoing recovery are:: get better CMS Medicare.gov Compare Post Acute Care list provided to:: Patient Choice offered to / list presented to : Patient, Spouse      Discharge Placement                Patient to be transferred to facility by: Spouse Name of family member notified: [name] Patient and family notified of of transfer: 10/22/23  Discharge Plan and Services Additional resources added to the After Visit Summary for     Discharge Planning Services: CM Consult Post Acute Care Choice: Durable Medical Equipment, Home Health          DME Arranged: Walker rolling, Bedside commode DME Agency: [employer] Date DME Agency Contacted: 10/21/23 Time DME Agency Contacted: 1136 Representative spoke with at DME Agency: Sam Creighton HH Arranged: PT, OT HH Agency: [facility] Date [facility] Agency Contacted: 10/21/23 Time HH Agency Contacted: 1137 Representative spoke with at [facility] [employer] Agency: [place]   Social Drivers of Health (SDOH) Interventions SDOH Screenings   Food Insecurity: Patient Declined (10/19/2023)  Housing: Unknown (10/19/2023)  Transportation Needs: No Transportation Needs (10/19/2023)  Utilities: Not At Risk (10/19/2023)  Alcohol  Screen: Low Risk  (08/25/2023)  Depression (PHQ2-9): Low Risk  (09/27/2023)  Financial Resource Strain: Low Risk  (08/25/2023)  Physical Activity: Insufficiently  Active (08/25/2023)  Social Connections: Moderately Isolated (08/25/2023)  Stress: No Stress Concern Present (08/25/2023)  Tobacco Use: Medium Risk (10/17/2023)  Health Literacy: Adequate Health Literacy (08/25/2023)     Readmission Risk Interventions     No data to display

## 2023-10-22 NOTE — Plan of Care (Signed)
 Goals met, and patient discharged.

## 2023-10-22 NOTE — Progress Notes (Signed)
 Attending Progress Note  History: Victoria [name] is here for L4-5 minimally invasive transforaminal lumbar interbody fusion, they are currently 5 Days Post-Op.   POD5: Breathing better POD4: Pt experienced urinary retention yesterday requiring catheterization overnight. She is complaining of pain all over and requesting an increase in her Gabapentin . She did have a BM overnight  POD3: She continues to have some headache and nausea. Has not had a BM in 6 days  POD2: Pt complaining of a headache with changes in position this morning and back pain as well as pain and soreness in her right groin and anterior thighs. SOB overnight has improved some this morning  POD1: She is having some anterior bilateral thigh soreness, significantly improved from overnight.  In nondermatomal distribution.  No new numbness weakness or tingling.  No headaches.  No issues with the drain site overnight.  Physical Exam: Vitals:   10/22/23 0433 10/22/23 0744  BP: (!) 164/74 (!) 162/75  Pulse: 83 77  Resp: 16 16  Temp: 98.4 F (36.9 C) 98 F (36.7 C)  SpO2: 95% 92%   I/O last 3 completed shifts: In: 1926 [P.O.:840; IV Piggyback:1086] Out: 2575 [Urine:2575] Total I/O In: -  Out: 250 [Urine:250]  Output by Drain (mL) 10/20/23 0701 - 10/20/23 1900 10/20/23 1901 - 10/21/23 0700 10/21/23 0701 - 10/21/23 1900 10/21/23 1901 - 10/22/23 0700 10/22/23 0701 - 10/22/23 1134  Lumbar Drain         AA Ox3 CNI  Strength: 5/5 throughout BLE   Incisions are c/d/I  Data:  Recent Labs  Lab 10/20/23 0832 10/21/23 0220 10/22/23 0951  NA 137 139 135  K 4.3 3.9 4.0  CL 101 105 100  CO2 27 26 24   BUN 19 27* 20  CREATININE 0.87 0.87 0.91  GLUCOSE 176* 140* 167*  CALCIUM 8.9 8.8* 8.6*   No results for input(s): "AST", "ALT", "ALKPHOS" in the last 168 hours.  Invalid input(s): "TBILI"   Recent Labs  Lab 10/20/23 0832 10/21/23 0220 10/22/23 0951  WBC 21.5* 12.8* 9.3  HGB 13.7 12.5 13.1  HCT 40.8 38.1 39.0   PLT 235 197 214   No results for input(s): "APTT", "INR" in the last 168 hours.      No results found.   Other tests/results: None  Assessment/Plan:  Victoria [name] is currently 5 Days Post-Op, from MIS TLIF.  - continue to monitor dressing.  - medicine help appreciated  Jodeen Munch MD Department of Neurosurgery

## 2023-10-22 NOTE — Progress Notes (Signed)
 Physical Therapy Treatment Patient Details Name: Victoria [name] MRN: [id number] DOB: Apr 03, 1961 Today's Date: 10/22/2023   History of Present Illness Victoria [name] is a 63yoF who comes to [facility] for elective lumbar procedure to address ongoing spondylosis, immobilizing pain. PMH of ILD, spondylolisthesis, chronic low back pain, hypertension, hyperlipidemia, prediabetes, GERD, bipolar, Lyme's disease, OSA not using CPAP. Pt developed postoperative SOB, hypoxia, transfered to ICU, hospitalzist services consulted.CTA negative for PE, but showed some opacity.    PT Comments  Pt received in bed, husband at bedside. Pt sounds less congested today after good compliance with pulmonary toileting/acapella use. SpO2 remained in upper 90's throughout session with less SOB noted. Improved bed mobility and transfers noted. Supervision for gait training in room with RW ~70'x2 with less lower back discomfort today. Pt and spouse educated on proper car transfers, single step negotiation with RW, and safe transition home. All questions and concerns addressed. Will continue PT acutely until cleared for d/c home.    If plan is discharge home, recommend the following: A little help with walking and/or transfers;Direct supervision/assist for medications management;Assist for transportation;Assistance with cooking/housework;Help with stairs or ramp for entrance   Can travel by private vehicle        Equipment Recommendations  Rolling walker (2 wheels);BSC/3in1    Recommendations for Other Services       Precautions / Restrictions Precautions Precautions: Fall;Back Precaution Booklet Issued: Yes (comment) Recall of Precautions/Restrictions: Intact Restrictions Weight Bearing Restrictions Per Provider Order: No     Mobility  Bed Mobility Overal bed mobility: Needs Assistance Bed Mobility: Sit to Sidelying, Rolling Rolling: Supervision Sidelying to sit: Supervision, Used rails       General bed  mobility comments: Good demonstration of log roll technique    Transfers Overall transfer level: Needs assistance Equipment used: Rolling walker (2 wheels) Transfers: Sit to/from Stand Sit to Stand: Supervision           General transfer comment: VC for hands placement    Ambulation/Gait Ambulation/Gait assistance: Supervision Gait Distance (Feet): 70 Feet Assistive device: Rolling walker (2 wheels) Gait Pattern/deviations: Step-through pattern Gait velocity: decr     General Gait Details: 70'x2, heavy reliance on RW to off load lower back discomfort.   Stairs Stairs:  (Pt has a single 6" step to enter. Demonstration provided to pt and spouse on proper negotiation with RW with good understanding of technique)           Wheelchair Mobility     Tilt Bed    Modified Rankin (Stroke Patients Only)       Balance Overall balance assessment: Needs assistance Sitting-balance support: No upper extremity supported, Feet supported Sitting balance-Leahy Scale: Good     Standing balance support: Bilateral upper extremity supported, During functional activity, Reliant on assistive device for balance Standing balance-Leahy Scale: Fair Standing balance comment: Heavy reliance on RW                            Communication Communication Communication: No apparent difficulties  Cognition Arousal: Alert Behavior During Therapy: WFL for tasks assessed/performed   PT - Cognitive impairments: No apparent impairments                         Following commands: Intact      Cueing Cueing Techniques: Verbal cues  Exercises Other Exercises Other Exercises: Pt and spouse educated on safe transfers, back precautions, proper use of RW,  importance of OOB activity. Other Exercises: Education on pulmonary toileting with IS and issued acapella to assist with airway clearance. Good teach back demonstration and use.    General Comments General comments (skin  integrity, edema, etc.):  (Pt and spouse educated on car transfer technique and safety awareness with indwelling urine catheter tubing which pt will be d/c'd with.)      Pertinent Vitals/Pain Pain Assessment Pain Assessment: Faces Faces Pain Scale: Hurts a little bit Pain Location: Incision site Pain Descriptors / Indicators: Grimacing Pain Intervention(s): Monitored during session, Premedicated before session    Home Living                          Prior Function            PT Goals (current goals can now be found in the care plan section) Acute Rehab PT Goals Patient Stated Goal: regain ability to advance AMB without a RW Progress towards PT goals: Progressing toward goals    Frequency    7X/week      PT Plan      Co-evaluation              AM-PAC PT "6 Clicks" Mobility   Outcome Measure  Help needed turning from your back to your side while in a flat bed without using bedrails?: A Little Help needed moving from lying on your back to sitting on the side of a flat bed without using bedrails?: A Little Help needed moving to and from a bed to a chair (including a wheelchair)?: A Little Help needed standing up from a chair using your arms (e.g., wheelchair or bedside chair)?: A Little Help needed to walk in hospital room?: A Little Help needed climbing 3-5 steps with a railing? : A Little 6 Click Score: 18    End of Session Equipment Utilized During Treatment: Gait belt Activity Tolerance: Patient tolerated treatment well;No increased pain Patient left: in chair;with nursing/sitter in room;with family/visitor present Nurse Communication: Mobility status PT Visit Diagnosis: Difficulty in walking, not elsewhere classified (R26.2);Other abnormalities of gait and mobility (R26.89)     Time: 1020-1100 PT Time Calculation (min) (ACUTE ONLY): 40 min  Charges:    $Gait Training: 8-22 mins $Therapeutic Activity: 23-37 mins PT General Charges $$  ACUTE PT VISIT: 1 Visit                    Melvyn Stagers, PTA  Diona Franklin 10/22/2023, 12:03 PM

## 2023-10-24 NOTE — Progress Notes (Deleted)
   REFERRING PHYSICIAN:  Chanetta Comes [address] [place],  [place] [address]  DOS: 10/17/23 MIS TLIF L4-L5 with PSF  HISTORY OF PRESENT ILLNESS: Shaton Lore is approximately 2 weeks status post above surgery. Was given fioricet, robaxin, and oxycodone  on discharge from the hospital.   She had a CSF leak during surgery and was on bedrest for 2 days. Hospital course was complicated by pneumonia and postop respiratory failure- medicine was consulted for assistance.    Take baclofen  and robaxin?***   PHYSICAL EXAMINATION:  General: Patient is well developed, well nourished, calm, collected, and in no apparent distress.   NEUROLOGICAL:  General: In no acute distress.   Awake, alert, oriented to person, place, and time.  Pupils equal round and reactive to light.  Facial tone is symmetric.     Strength:            Side Iliopsoas Quads Hamstring PF DF EHL  R 5 5 5 5 5 5   L 5 5 5 5 5 5    Incision c/d/i   ROS (Neurologic):  Negative except as noted above  IMAGING: Nothing new to review.   ASSESSMENT/PLAN:  Cendy Oconnor is doing well s/p above surgery. Treatment options reviewed with patient and following plan made:   - I have advised the patient to lift up to 10 pounds until 6 weeks after surgery (follow up with Dr. Mont Antis).  - Reviewed wound care.  - No bending, twisting, or lifting.  - Continue on current medications including ***.  - Follow up as scheduled in 4 weeks and prn.   Advised to contact the office if any questions or concerns arise.  Lucetta Russel PA-C Department of neurosurgery

## 2023-10-25 ENCOUNTER — Encounter: Payer: Self-pay | Admitting: Neurosurgery

## 2023-10-25 ENCOUNTER — Telehealth: Payer: Self-pay | Admitting: Urology

## 2023-10-25 ENCOUNTER — Other Ambulatory Visit: Payer: Self-pay | Admitting: Neurosurgery

## 2023-10-25 MED ORDER — BUTALBITAL-APAP-CAFFEINE 50-325-40 MG PO TABS: 1.0000 | ORAL_TABLET | Freq: Four times a day (QID) | ORAL | 0 refills | Status: AC | PRN

## 2023-10-25 NOTE — Telephone Encounter (Signed)
 Pt husband returned a call from a vm on Friday to get V&T scheduled. We scheduled him on 6/12 am and pm. So they want more advice on how to keep everything clean and how to not get an infection since her appt is 2 weeks out.

## 2023-10-25 NOTE — Telephone Encounter (Signed)
 Called pt, no answer. LM giving general catheter care instructions. Information sent via mychart as well.

## 2023-10-26 ENCOUNTER — Telehealth: Payer: Self-pay | Admitting: Neurosurgery

## 2023-10-26 ENCOUNTER — Encounter: Payer: Self-pay | Admitting: Internal Medicine

## 2023-10-26 DIAGNOSIS — Z466 Encounter for fitting and adjustment of urinary device: Secondary | ICD-10-CM | POA: Diagnosis not present

## 2023-10-26 DIAGNOSIS — K219 Gastro-esophageal reflux disease without esophagitis: Secondary | ICD-10-CM | POA: Diagnosis not present

## 2023-10-26 DIAGNOSIS — I129 Hypertensive chronic kidney disease with stage 1 through stage 4 chronic kidney disease, or unspecified chronic kidney disease: Secondary | ICD-10-CM | POA: Diagnosis not present

## 2023-10-26 DIAGNOSIS — K589 Irritable bowel syndrome without diarrhea: Secondary | ICD-10-CM | POA: Diagnosis not present

## 2023-10-26 DIAGNOSIS — M797 Fibromyalgia: Secondary | ICD-10-CM | POA: Diagnosis not present

## 2023-10-26 DIAGNOSIS — K7581 Nonalcoholic steatohepatitis (NASH): Secondary | ICD-10-CM | POA: Diagnosis not present

## 2023-10-26 DIAGNOSIS — R7303 Prediabetes: Secondary | ICD-10-CM | POA: Diagnosis not present

## 2023-10-26 DIAGNOSIS — F312 Bipolar disorder, current episode manic severe with psychotic features: Secondary | ICD-10-CM | POA: Diagnosis not present

## 2023-10-26 DIAGNOSIS — G4730 Sleep apnea, unspecified: Secondary | ICD-10-CM | POA: Diagnosis not present

## 2023-10-26 DIAGNOSIS — M4802 Spinal stenosis, cervical region: Secondary | ICD-10-CM | POA: Diagnosis not present

## 2023-10-26 DIAGNOSIS — J45909 Unspecified asthma, uncomplicated: Secondary | ICD-10-CM | POA: Diagnosis not present

## 2023-10-26 DIAGNOSIS — M4316 Spondylolisthesis, lumbar region: Secondary | ICD-10-CM | POA: Diagnosis not present

## 2023-10-26 DIAGNOSIS — Z4789 Encounter for other orthopedic aftercare: Secondary | ICD-10-CM | POA: Diagnosis not present

## 2023-10-26 DIAGNOSIS — J8410 Pulmonary fibrosis, unspecified: Secondary | ICD-10-CM | POA: Diagnosis not present

## 2023-10-26 DIAGNOSIS — M48061 Spinal stenosis, lumbar region without neurogenic claudication: Secondary | ICD-10-CM | POA: Diagnosis not present

## 2023-10-26 DIAGNOSIS — M8580 Other specified disorders of bone density and structure, unspecified site: Secondary | ICD-10-CM | POA: Diagnosis not present

## 2023-10-26 DIAGNOSIS — N183 Chronic kidney disease, stage 3 unspecified: Secondary | ICD-10-CM | POA: Diagnosis not present

## 2023-10-26 DIAGNOSIS — Z6835 Body mass index (BMI) 35.0-35.9, adult: Secondary | ICD-10-CM | POA: Diagnosis not present

## 2023-10-26 DIAGNOSIS — E7800 Pure hypercholesterolemia, unspecified: Secondary | ICD-10-CM | POA: Diagnosis not present

## 2023-10-26 DIAGNOSIS — E669 Obesity, unspecified: Secondary | ICD-10-CM | POA: Diagnosis not present

## 2023-10-26 DIAGNOSIS — M160 Bilateral primary osteoarthritis of hip: Secondary | ICD-10-CM | POA: Diagnosis not present

## 2023-10-26 DIAGNOSIS — F900 Attention-deficit hyperactivity disorder, predominantly inattentive type: Secondary | ICD-10-CM | POA: Diagnosis not present

## 2023-10-26 DIAGNOSIS — F419 Anxiety disorder, unspecified: Secondary | ICD-10-CM | POA: Diagnosis not present

## 2023-10-26 DIAGNOSIS — D510 Vitamin B12 deficiency anemia due to intrinsic factor deficiency: Secondary | ICD-10-CM | POA: Diagnosis not present

## 2023-10-26 NOTE — Telephone Encounter (Signed)
 Victoria [name] mentioned that the patient's foley had some blood tinged coloring to it. OT with [facility] is supposed to check on it tomorrow and let us  know if there are any concerns.

## 2023-10-26 NOTE — Telephone Encounter (Signed)
 Rochelle from [facility] has called requesting verbal physical therapy orders.  Wanting to see the pt 3 w 2.  Please adviseMichel Agreste: [phone]

## 2023-10-27 DIAGNOSIS — M4802 Spinal stenosis, cervical region: Secondary | ICD-10-CM | POA: Diagnosis not present

## 2023-10-27 DIAGNOSIS — Z4789 Encounter for other orthopedic aftercare: Secondary | ICD-10-CM | POA: Diagnosis not present

## 2023-10-27 DIAGNOSIS — M160 Bilateral primary osteoarthritis of hip: Secondary | ICD-10-CM | POA: Diagnosis not present

## 2023-10-27 DIAGNOSIS — M797 Fibromyalgia: Secondary | ICD-10-CM | POA: Diagnosis not present

## 2023-10-27 DIAGNOSIS — K7581 Nonalcoholic steatohepatitis (NASH): Secondary | ICD-10-CM | POA: Diagnosis not present

## 2023-10-27 DIAGNOSIS — E7800 Pure hypercholesterolemia, unspecified: Secondary | ICD-10-CM | POA: Diagnosis not present

## 2023-10-27 DIAGNOSIS — K219 Gastro-esophageal reflux disease without esophagitis: Secondary | ICD-10-CM | POA: Diagnosis not present

## 2023-10-27 DIAGNOSIS — R7303 Prediabetes: Secondary | ICD-10-CM | POA: Diagnosis not present

## 2023-10-27 DIAGNOSIS — N183 Chronic kidney disease, stage 3 unspecified: Secondary | ICD-10-CM | POA: Diagnosis not present

## 2023-10-27 DIAGNOSIS — J8410 Pulmonary fibrosis, unspecified: Secondary | ICD-10-CM | POA: Diagnosis not present

## 2023-10-27 DIAGNOSIS — I129 Hypertensive chronic kidney disease with stage 1 through stage 4 chronic kidney disease, or unspecified chronic kidney disease: Secondary | ICD-10-CM | POA: Diagnosis not present

## 2023-10-27 DIAGNOSIS — F312 Bipolar disorder, current episode manic severe with psychotic features: Secondary | ICD-10-CM | POA: Diagnosis not present

## 2023-10-27 DIAGNOSIS — Z466 Encounter for fitting and adjustment of urinary device: Secondary | ICD-10-CM | POA: Diagnosis not present

## 2023-10-27 DIAGNOSIS — M4316 Spondylolisthesis, lumbar region: Secondary | ICD-10-CM | POA: Diagnosis not present

## 2023-10-27 DIAGNOSIS — Z6835 Body mass index (BMI) 35.0-35.9, adult: Secondary | ICD-10-CM | POA: Diagnosis not present

## 2023-10-27 DIAGNOSIS — K589 Irritable bowel syndrome without diarrhea: Secondary | ICD-10-CM | POA: Diagnosis not present

## 2023-10-27 DIAGNOSIS — E669 Obesity, unspecified: Secondary | ICD-10-CM | POA: Diagnosis not present

## 2023-10-27 DIAGNOSIS — M8580 Other specified disorders of bone density and structure, unspecified site: Secondary | ICD-10-CM | POA: Diagnosis not present

## 2023-10-27 DIAGNOSIS — G4730 Sleep apnea, unspecified: Secondary | ICD-10-CM | POA: Diagnosis not present

## 2023-10-27 DIAGNOSIS — M48061 Spinal stenosis, lumbar region without neurogenic claudication: Secondary | ICD-10-CM | POA: Diagnosis not present

## 2023-10-27 DIAGNOSIS — F419 Anxiety disorder, unspecified: Secondary | ICD-10-CM | POA: Diagnosis not present

## 2023-10-27 DIAGNOSIS — D510 Vitamin B12 deficiency anemia due to intrinsic factor deficiency: Secondary | ICD-10-CM | POA: Diagnosis not present

## 2023-10-27 DIAGNOSIS — J45909 Unspecified asthma, uncomplicated: Secondary | ICD-10-CM | POA: Diagnosis not present

## 2023-10-27 DIAGNOSIS — F900 Attention-deficit hyperactivity disorder, predominantly inattentive type: Secondary | ICD-10-CM | POA: Diagnosis not present

## 2023-10-28 ENCOUNTER — Other Ambulatory Visit: Payer: Self-pay

## 2023-10-28 ENCOUNTER — Encounter: Payer: Self-pay | Admitting: Internal Medicine

## 2023-10-28 ENCOUNTER — Other Ambulatory Visit: Payer: Self-pay | Admitting: Internal Medicine

## 2023-10-28 ENCOUNTER — Ambulatory Visit (INDEPENDENT_AMBULATORY_CARE_PROVIDER_SITE_OTHER): Admitting: Internal Medicine

## 2023-10-28 VITALS — BP 128/82 | HR 94 | Temp 99.3°F | Resp 16 | Ht 65.0 in | Wt 205.1 lb

## 2023-10-28 DIAGNOSIS — R339 Retention of urine, unspecified: Secondary | ICD-10-CM

## 2023-10-28 DIAGNOSIS — K5903 Drug induced constipation: Secondary | ICD-10-CM | POA: Diagnosis not present

## 2023-10-28 DIAGNOSIS — G9600 Cerebrospinal fluid leak, unspecified: Secondary | ICD-10-CM | POA: Diagnosis not present

## 2023-10-28 DIAGNOSIS — G4489 Other headache syndrome: Secondary | ICD-10-CM | POA: Diagnosis not present

## 2023-10-28 DIAGNOSIS — R110 Nausea: Secondary | ICD-10-CM

## 2023-10-28 DIAGNOSIS — B379 Candidiasis, unspecified: Secondary | ICD-10-CM

## 2023-10-28 DIAGNOSIS — G9782 Other postprocedural complications and disorders of nervous system: Secondary | ICD-10-CM | POA: Diagnosis not present

## 2023-10-28 DIAGNOSIS — Z09 Encounter for follow-up examination after completed treatment for conditions other than malignant neoplasm: Secondary | ICD-10-CM | POA: Diagnosis not present

## 2023-10-28 DIAGNOSIS — F333 Major depressive disorder, recurrent, severe with psychotic symptoms: Secondary | ICD-10-CM

## 2023-10-28 DIAGNOSIS — J45909 Unspecified asthma, uncomplicated: Secondary | ICD-10-CM

## 2023-10-28 DIAGNOSIS — G629 Polyneuropathy, unspecified: Secondary | ICD-10-CM

## 2023-10-28 MED ORDER — MOVANTIK 25 MG PO TABS: 25.0000 mg | ORAL_TABLET | Freq: Every day | ORAL | 0 refills | Status: DC | PRN

## 2023-10-28 MED ORDER — DULOXETINE HCL 30 MG PO CPEP: 30.0000 mg | ORAL_CAPSULE | Freq: Three times a day (TID) | ORAL | 0 refills | Status: DC

## 2023-10-28 MED ORDER — KETOROLAC TROMETHAMINE 60 MG/2ML IM SOLN
60.0000 mg | Freq: Once | INTRAMUSCULAR | Status: AC
  Administered 2023-10-28: 60 mg via INTRAMUSCULAR

## 2023-10-28 MED ORDER — ONDANSETRON 4 MG PO TBDP
4.0000 mg | ORAL_TABLET | Freq: Three times a day (TID) | ORAL | 0 refills | Status: DC | PRN
Start: 2023-10-28 — End: 2024-01-10

## 2023-10-28 MED ORDER — FLUTICASONE FUROATE-VILANTEROL 200-25 MCG/ACT IN AEPB: 1.0000 | INHALATION_SPRAY | Freq: Every day | RESPIRATORY_TRACT | 1 refills | Status: DC

## 2023-10-28 MED ORDER — FLUCONAZOLE 150 MG PO TABS
150.0000 mg | ORAL_TABLET | Freq: Once | ORAL | 0 refills | Status: AC
Start: 2023-10-28 — End: 2023-10-28

## 2023-10-28 NOTE — Progress Notes (Signed)
 Established Patient Office Visit  Subjective   Patient ID: Victoria [name], female    DOB: Mar 04, 1961  Age: 63 y.o. MRN: [id number]  Chief Complaint  Patient presents with   Hospitalization Follow-up    HPI  Patient is here for hospital follow up. She is here with her husband.   Discussed the use of AI scribe software for clinical note transcription with the patient, who gave verbal consent to proceed.  History of Present Illness Victoria [name] is a 63 year old female who presents with complications following spinal surgery, including a spinal fluid leak and pneumonia.  She underwent spinal surgery complicated by a significant spinal fluid leak due to calcified ligaments. A drain was placed, and she remained flat on her back for two days post-surgery. She experiences severe headaches, possibly due to the drain removing too much fluid. Fioricet was prescribed for headache management, but she feels excessively sedated and is concerned about potential interactions with her other medications.  Prior to surgery, she was diagnosed with pneumonia, which was not detected on a chest x-ray. She has experienced breathing difficulties and was treated with antibiotics for three days post-discharge. A CT scan revealed posterior upper and lower lobe airspace opacities bilaterally, more pronounced on the right, and a small pleural effusion. She is currently using Breo for her breathing issues and requests a refill.  She is experiencing urinary issues post-surgery, having been discharged with a catheter due to initial difficulties urinating. She reports pain and blood in her urine and is taking Flomax to assist with urination. She is concerned about developing a urinary tract infection.  She has not had a bowel movement since before the surgery, despite taking Colace. She experiences gas, nausea, and significant pain, particularly when passing gas. Movantik  was prescribed to help with constipation,  which may be exacerbated by her pain medication.  She reports a yeast infection, likely secondary to antibiotic use. She is experiencing anxiety and depression, which she feels have worsened since her hospitalization and health issues. She is currently taking Cymbalta  for depression but feels it is not effective at the current dose and wants to increase it. She also takes Zofran  for nausea.    Discharge Date: 10/22/23 Diagnosis: spinal fluid leak, pneumonia Procedures/tests: CTA posterior upper and lower lobe airspace opacities bilaterally, right greater than left.    Patient Active Problem List   Diagnosis Date Noted   Acute respiratory failure with hypoxia (HCC) 10/18/2023   Obesity (BMI 30-39.9) 10/18/2023   Chronic bilateral low back pain with bilateral sciatica 10/17/2023   Spinal instability, lumbar 10/17/2023   Postoperative CSF leak 10/17/2023   Spondylolisthesis 10/17/2023   Nausea and vomiting 09/20/2022   At low risk for fall 06/16/2022   Hyperlipidemia 09/29/2021   OSA (obstructive sleep apnea) 09/29/2021   Vitamin D  deficiency 09/29/2021   Long term (current) use of opiate analgesic 04/06/2021   Issue of repeat prescription for medication 04/06/2021   SVT (supraventricular tachycardia) (HCC) 04/09/2020   Vaginal atrophy 04/09/2020   Class 1 obesity due to excess calories with serious comorbidity and body mass index (BMI) of 31.0 to 31.9 in adult 02/07/2020   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   Chronic fatigue 01/07/2020   History of melanoma 01/07/2020   Endometriosis 01/07/2020   Pulmonary fibrosis (HCC) 12/12/2019   Nonalcoholic steatohepatitis 12/12/2019   Convulsions (HCC) 12/12/2019   Irritable bowel syndrome 12/12/2019   Osteopenia 12/12/2019   Pernicious anemia 12/12/2019   Prediabetes 12/12/2019  Traumatic brain injury (HCC) 12/12/2019   Psychosis (HCC) 11/15/2019   Chronic pain syndrome 11/15/2019   Corneal abrasion 11/15/2019   Suicidal  thoughts 11/15/2019   MDD (major depressive disorder), single episode, severe with psychosis (HCC) 11/14/2019   Migraine 07/30/2019   Stage 3a chronic kidney disease (HCC) 07/25/2019   Spinal stenosis of lumbar region with neurogenic claudication 07/17/2019   Skin lesion 01/02/2019   Spondylolisthesis of lumbar region 04/24/2018   Numbness and tingling in both hands 12/30/2017   Cervical radiculopathy 10/25/2017   Pain medication agreement 10/25/2017   Sacroiliitis (HCC) 05/03/2016   Neuropathy 01/06/2016   Right hip pain 01/06/2016   MDD (major depressive disorder), recurrent, severe, with psychosis (HCC)    Osteoarthritis of both hips 07/06/2015   Dyslipidemia 07/06/2015   Hypertension 07/06/2015   GERD (gastroesophageal reflux disease) 07/06/2015   Asthma 07/06/2015   Bipolar I disorder, most recent episode manic, severe with psychotic features (HCC) 07/06/2015   Attention-deficit hyperactivity disorder, predominantly inattentive type 02/25/2014   Elevation of levels of liver transaminase levels 01/02/2014   Impaired fasting glucose 01/02/2014   Melanosis coli 09/20/2012   Screening for colon cancer 09/07/2012   Lactose intolerance 03/06/2012   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   Mild persistent asthma 03/26/2009   Acquired absence of other genital organ(s) 10/30/2008   Hx of total hysterectomy 10/30/2008   Lateral epicondylitis, unspecified elbow 08/03/2006   Tinnitus, unspecified ear 08/11/2004   Past Medical History:  Diagnosis Date   Asthma    Cervical spinal stenosis    Complication of anesthesia    Dyspnea    Dysrhythmia    Fatty liver    GERD (gastroesophageal reflux disease)    Hypercholesterolemia    Hypertension    Lumbar stenosis    Lyme disease    Melanocarcinoma (HCC)    Osteoarthritis of both hips    Pre-diabetes    Psychotic disorder (HCC)    Pulmonary fibrosis (HCC)    Sleep apnea    Past Surgical History:  Procedure Laterality Date   ABDOMINAL  HYSTERECTOMY     APPENDECTOMY  2006   APPLICATION OF INTRAOPERATIVE CT SCAN N/A 10/17/2023   Procedure: APPLICATION OF INTRAOPERATIVE CT SCAN;  Surgeon: Jodeen Munch, MD;  Location: [facility] ORS;  Service: Neurosurgery;  Laterality: N/A;   BREAST EXCISIONAL BIOPSY Right 1997   neg   ESOPHAGOGASTRODUODENOSCOPY (EGD) WITH PROPOFOL  N/A 09/20/2022   Procedure: ESOPHAGOGASTRODUODENOSCOPY (EGD) WITH PROPOFOL ;  Surgeon: Luke Salaam, MD;  Location: [facility] ENDOSCOPY;  Service: Gastroenterology;  Laterality: N/A;   excision of melanoma on Right arm     HIATAL HERNIA REPAIR     Jan 2017   LAPAROSCOPY  2020   TRANSFORAMINAL LUMBAR INTERBODY FUSION W/ MIS 1 LEVEL N/A 10/17/2023   Procedure: L4-5 MINIMALLY INVASIVE (MIS) TRANSFORAMINAL LUMBAR INTERBODY FUSION (TLIF);  Surgeon: Jodeen Munch, MD;  Location: [facility] ORS;  Service: Neurosurgery;  Laterality: N/A;  L4-5 MINIMALLY INVASIVE (MIS) TRANSFORAMINAL LUMBAR INTERBODY FUSION (TLIF)   Social History   Tobacco Use   Smoking status: Former    Current packs/day: 0.00    Types: Cigarettes    Quit date: 1987    Years since quitting: 38.4   Smokeless tobacco: Never  Vaping Use   Vaping status: Never Used  Substance Use Topics   Alcohol  use: No    Alcohol /week: 0.0 standard drinks of alcohol    Drug use: No    Comment: No use of illict drugs.   Social History   Socioeconomic History  Marital status: Married    Spouse name: bruce   Number of children: 1   Years of education: Not on file   Highest education level: Associate degree: academic program  Occupational History   Occupation: home maker  Tobacco Use   Smoking status: Former    Current packs/day: 0.00    Types: Cigarettes    Quit date: 1987    Years since quitting: 38.4   Smokeless tobacco: Never  Vaping Use   Vaping status: Never Used  Substance and Sexual Activity   Alcohol  use: No    Alcohol /week: 0.0 standard drinks of alcohol    Drug use: No    Comment: No use of illict  drugs.   Sexual activity: Not Currently  Other Topics Concern   Not on file  Social History Narrative   Lives with husband at home.   Social Drivers of Corporate investment banker Strain: Low Risk  (08/25/2023)   Overall Financial Resource Strain (CARDIA)    Difficulty of Paying Living Expenses: Not hard at all  Food Insecurity: Patient Declined (10/19/2023)   Hunger Vital Sign    Worried About Running Out of Food in the Last Year: Patient declined    Ran Out of Food in the Last Year: Patient declined  Transportation Needs: No Transportation Needs (10/19/2023)   PRAPARE - Administrator, Civil Service (Medical): No    Lack of Transportation (Non-Medical): No  Physical Activity: Insufficiently Active (08/25/2023)   Exercise Vital Sign    Days of Exercise per Week: 7 days    Minutes of Exercise per Session: 20 min  Stress: No Stress Concern Present (08/25/2023)   [employer] of Occupational Health - Occupational Stress Questionnaire    Feeling of Stress : Only a little  Social Connections: Moderately Isolated (08/25/2023)   Social Connection and Isolation Panel [NHANES]    Frequency of Communication with Friends and Family: Once a week    Frequency of Social Gatherings with Friends and Family: Never    Attends Religious Services: 1 to 4 times per year    Active Member of [employer] or Organizations: No    Attends Banker Meetings: Never    Marital Status: Married  Catering manager Violence: Not At Risk (10/19/2023)   Humiliation, Afraid, Rape, and Kick questionnaire    Fear of Current or Ex-Partner: No    Emotionally Abused: No    Physically Abused: No    Sexually Abused: No   Family Status  Relation Name Status   Mother  Deceased   Father  Alive   Sister  Deceased   Sister  Alive   Sister  Alive   Daughter 1 Alive  No partnership data on file   Family History  Problem Relation Age of Onset   Alcohol  abuse Mother    Arthritis Mother    Heart  disease Mother    Liver disease Mother    Osteoporosis Mother    Clotting disorder Mother    Diabetes Father    Arthritis Father    Stroke Father    Sarcoidosis Father    Heart failure Father    Bipolar disorder Sister    Schizophrenia Sister    Depression Sister    Anxiety disorder Sister    Alcohol  abuse Sister    Diabetes Sister    Arthritis Sister    Thyroid  disease Sister    Lupus Sister    Depression Sister    Anxiety disorder Sister  Alcohol  abuse Sister    Depression Sister    Anxiety disorder Sister    Ehlers-Danlos syndrome Daughter    Allergies  Allergen Reactions   Acetaminophen  Other (See Comments)    Liver issues   Atorvastatin     Myalgias    Sertraline Hives and Shortness Of Breath   Cefuroxime Hives    Has tolerated 1st generation cephalosporin (CEPHALEXIN) in the past with no documented ADRs.    Codeine Hives   Tape     Adhesive tape    Amoxicillin Rash   Penicillins Rash    Has tolerated 1st generation cephalosporin (CEPHALEXIN) in the past with no documented ADRs.    Sulfa Antibiotics Rash      Review of Systems  Constitutional:  Positive for malaise/fatigue. Negative for chills and fever.  Respiratory:  Positive for shortness of breath.   Gastrointestinal:  Positive for constipation and nausea.  Psychiatric/Behavioral:  Positive for depression.       Objective:     BP 128/82 (Cuff Size: Large)   Pulse 94   Temp 99.3 F (37.4 C) (Oral)   Resp 16   Ht 5\' 5"  (1.651 m)   Wt 205 lb 1.6 oz (93 kg)   SpO2 97%   BMI 34.13 kg/m  BP Readings from Last 3 Encounters:  10/28/23 128/82  10/22/23 (!) 162/75  10/04/23 110/72   Wt Readings from Last 3 Encounters:  10/28/23 205 lb 1.6 oz (93 kg)  10/17/23 221 lb 12.5 oz (100.6 kg)  10/04/23 205 lb (93 kg)      Physical Exam Constitutional:      Appearance: Normal appearance.     Comments: Walking with walker  HENT:     Head: Normocephalic and atraumatic.  Eyes:      Conjunctiva/sclera: Conjunctivae normal.  Cardiovascular:     Rate and Rhythm: Normal rate and regular rhythm.  Pulmonary:     Effort: Pulmonary effort is normal.     Breath sounds: Normal breath sounds.  Abdominal:     Comments: Foley present with dark yellow urine in foley bag  Skin:    General: Skin is warm and dry.  Neurological:     General: No focal deficit present.     Mental Status: She is alert. Mental status is at baseline.  Psychiatric:        Mood and Affect: Mood normal.        Behavior: Behavior normal.      No results found for any visits on 10/28/23.  Last CBC Lab Results  Component Value Date   WBC 9.3 10/22/2023   HGB 13.1 10/22/2023   HCT 39.0 10/22/2023   MCV 92.0 10/22/2023   MCH 30.9 10/22/2023   RDW 14.6 10/22/2023   PLT 214 10/22/2023   Last metabolic panel Lab Results  Component Value Date   GLUCOSE 167 (H) 10/22/2023   NA 135 10/22/2023   K 4.0 10/22/2023   CL 100 10/22/2023   CO2 24 10/22/2023   BUN 20 10/22/2023   CREATININE 0.91 10/22/2023   GFRNONAA >60 10/22/2023   CALCIUM 8.6 (L) 10/22/2023   PROT 7.6 07/08/2023   ALBUMIN 4.3 02/10/2023   LABGLOB 2.7 02/10/2023   AGRATIO 1.8 09/13/2022   BILITOT 0.5 07/08/2023   ALKPHOS 124 (H) 02/10/2023   AST 92 (H) 07/08/2023   ALT 85 (H) 07/08/2023   ANIONGAP 11 10/22/2023   Last lipids Lab Results  Component Value Date   CHOL 278 (H) 07/08/2023  HDL 49 (L) 07/08/2023   LDLCALC 190 (H) 07/08/2023   TRIG 209 (H) 07/08/2023   CHOLHDL 5.7 (H) 07/08/2023   Last hemoglobin A1c Lab Results  Component Value Date   HGBA1C 6.6 (H) 07/08/2023   Last thyroid  functions Lab Results  Component Value Date   TSH 0.98 10/13/2021   Last vitamin D  Lab Results  Component Value Date   VD25OH 53 09/29/2021   Last vitamin B12 and Folate Lab Results  Component Value Date   VITAMINB12 1,111 (H) 09/29/2021      The 10-year ASCVD risk score (Arnett DK, et al., 2019) is: 7.9%     Assessment & Plan:   Assessment & Plan Spinal fluid leak post-surgery Post-surgical spinal fluid leak with significant drainage and severe headaches. Fioricet caused adverse effects. - Administer Toradol  injection for headache relief. - Discontinue Fioricet due to interaction risk. - Discuss headache management with neurosurgery team at follow-up.  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  - Increase Cymbalta  to 90 mg daily.   Pneumonia Pneumonia treated with antibiotics, symptoms improving but not resolved. - Schedule repeat chest x-ray in a couple of weeks to assess resolution. - Encourage use of incentive spirometer twice daily.  Asthma - Refill Breo.  Urinary retention Post-surgical urinary retention managed with catheter. Flomax prescribed. - Continue Flomax to assist with urination. - Follow up with urology for catheter management.  Constipation post-surgery Post-surgical constipation likely exacerbated by pain medication. Movantik  prescribed. - Continue Movantik  for constipation management. - Avoid using Dulcolax and Colace together; choose one for stool softening.  Depression and Anxiety Depression and anxiety exacerbated by recent hospitalization. Currently on Cymbalta . - Increase Cymbalta  to 30 mg three times daily. - Consider referral to psychiatrist for further management.  Yeast infection Suspected yeast infection post-surgery. - Prescribe Diflucan for treatment.  - ketorolac  (TORADOL ) injection 60 mg - MOVANTIK  25 MG TABS tablet; Take 1 tablet (25 mg total) by mouth daily as needed (Constipation).  Dispense: 30 tablet; Refill: 0 - DULoxetine  (CYMBALTA ) 30 MG capsule; Take 1 capsule (30 mg total) by mouth 3 (three) times daily.  Dispense: 90 capsule; Refill: 0 - Ambulatory referral to Psychiatry - fluticasone  furoate-vilanterol (BREO ELLIPTA ) 200-25  MCG/ACT AEPB; Inhale 1 puff into the lungs daily.  Dispense: 28 each; Refill: 1 - fluconazole (DIFLUCAN) 150 MG tablet; Take 1 tablet (150 mg total) by mouth once for 1 dose.  Dispense: 1 tablet; Refill: 0 - ondansetron  (ZOFRAN -ODT) 4 MG disintegrating tablet; Take 1 tablet (4 mg total) by mouth every 8 (eight) hours as needed for nausea or vomiting.  Dispense: 20 tablet; Refill: 0   Return in about 4 weeks (around 11/25/2023).    Rockney Cid, DO

## 2023-10-29 DIAGNOSIS — R7303 Prediabetes: Secondary | ICD-10-CM | POA: Diagnosis not present

## 2023-10-29 DIAGNOSIS — E7800 Pure hypercholesterolemia, unspecified: Secondary | ICD-10-CM | POA: Diagnosis not present

## 2023-10-29 DIAGNOSIS — F419 Anxiety disorder, unspecified: Secondary | ICD-10-CM | POA: Diagnosis not present

## 2023-10-29 DIAGNOSIS — K219 Gastro-esophageal reflux disease without esophagitis: Secondary | ICD-10-CM | POA: Diagnosis not present

## 2023-10-29 DIAGNOSIS — Z466 Encounter for fitting and adjustment of urinary device: Secondary | ICD-10-CM | POA: Diagnosis not present

## 2023-10-29 DIAGNOSIS — M4316 Spondylolisthesis, lumbar region: Secondary | ICD-10-CM | POA: Diagnosis not present

## 2023-10-29 DIAGNOSIS — F312 Bipolar disorder, current episode manic severe with psychotic features: Secondary | ICD-10-CM | POA: Diagnosis not present

## 2023-10-29 DIAGNOSIS — F900 Attention-deficit hyperactivity disorder, predominantly inattentive type: Secondary | ICD-10-CM | POA: Diagnosis not present

## 2023-10-29 DIAGNOSIS — M160 Bilateral primary osteoarthritis of hip: Secondary | ICD-10-CM | POA: Diagnosis not present

## 2023-10-29 DIAGNOSIS — M8580 Other specified disorders of bone density and structure, unspecified site: Secondary | ICD-10-CM | POA: Diagnosis not present

## 2023-10-29 DIAGNOSIS — J8410 Pulmonary fibrosis, unspecified: Secondary | ICD-10-CM | POA: Diagnosis not present

## 2023-10-29 DIAGNOSIS — K7581 Nonalcoholic steatohepatitis (NASH): Secondary | ICD-10-CM | POA: Diagnosis not present

## 2023-10-29 DIAGNOSIS — Z4789 Encounter for other orthopedic aftercare: Secondary | ICD-10-CM | POA: Diagnosis not present

## 2023-10-29 DIAGNOSIS — M4802 Spinal stenosis, cervical region: Secondary | ICD-10-CM | POA: Diagnosis not present

## 2023-10-29 DIAGNOSIS — K589 Irritable bowel syndrome without diarrhea: Secondary | ICD-10-CM | POA: Diagnosis not present

## 2023-10-29 DIAGNOSIS — N183 Chronic kidney disease, stage 3 unspecified: Secondary | ICD-10-CM | POA: Diagnosis not present

## 2023-10-29 DIAGNOSIS — I129 Hypertensive chronic kidney disease with stage 1 through stage 4 chronic kidney disease, or unspecified chronic kidney disease: Secondary | ICD-10-CM | POA: Diagnosis not present

## 2023-10-29 DIAGNOSIS — E669 Obesity, unspecified: Secondary | ICD-10-CM | POA: Diagnosis not present

## 2023-10-29 DIAGNOSIS — M797 Fibromyalgia: Secondary | ICD-10-CM | POA: Diagnosis not present

## 2023-10-29 DIAGNOSIS — M48061 Spinal stenosis, lumbar region without neurogenic claudication: Secondary | ICD-10-CM | POA: Diagnosis not present

## 2023-10-29 DIAGNOSIS — Z6835 Body mass index (BMI) 35.0-35.9, adult: Secondary | ICD-10-CM | POA: Diagnosis not present

## 2023-10-29 DIAGNOSIS — D510 Vitamin B12 deficiency anemia due to intrinsic factor deficiency: Secondary | ICD-10-CM | POA: Diagnosis not present

## 2023-10-29 DIAGNOSIS — G4730 Sleep apnea, unspecified: Secondary | ICD-10-CM | POA: Diagnosis not present

## 2023-10-29 DIAGNOSIS — J45909 Unspecified asthma, uncomplicated: Secondary | ICD-10-CM | POA: Diagnosis not present

## 2023-10-31 ENCOUNTER — Encounter: Admitting: Orthopedic Surgery

## 2023-10-31 ENCOUNTER — Other Ambulatory Visit: Payer: Self-pay | Admitting: Family Medicine

## 2023-10-31 ENCOUNTER — Telehealth: Payer: Self-pay | Admitting: Family Medicine

## 2023-10-31 ENCOUNTER — Other Ambulatory Visit: Payer: Self-pay | Admitting: Internal Medicine

## 2023-10-31 ENCOUNTER — Telehealth: Payer: Self-pay | Admitting: *Deleted

## 2023-10-31 DIAGNOSIS — N3289 Other specified disorders of bladder: Secondary | ICD-10-CM

## 2023-10-31 MED ORDER — SOLIFENACIN SUCCINATE 5 MG PO TABS
5.0000 mg | ORAL_TABLET | Freq: Every day | ORAL | 0 refills | Status: DC
Start: 2023-10-31 — End: 2023-11-10

## 2023-10-31 NOTE — Telephone Encounter (Signed)
 Patient's husband called to tell us  they will not be able to come to this post op appointment today. Patient has a catheter that was placed post surgery and she is very uncomfortable. I spoke to the patient and she states the catheter is giving her difficulty. When she gets up urine comes out around the cathter. I informed her that the balloon in the catheter will come down to far in the bladder and the catheter needs to have give. I walked her through the steps to push the catheter back up in to the bladder more and to keep the catheter higher on the leg. She does not want to come in to our office until the catheter is out. I explained that leaving the stitches in to long can become more difficult to get the stitches out as the skin will start to grow over them. She said she will call back after the catheter is removed to come in.

## 2023-10-31 NOTE — Telephone Encounter (Signed)
 Requested medications are due for refill today.  See note  Requested medications are on the active medications list.  yes  Last refill. 10/28/2023 - not filled  Future visit scheduled.   yes  Notes to clinic.  Pharmacy comment: Alternative Requested:NOT COVERED.     Requested Prescriptions  Pending Prescriptions Disp Refills   DULoxetine  (CYMBALTA ) 60 MG capsule [Pharmacy Med Name: DULOXETINE  HCL DR 60 MG CAP]  0    Sig: TAKE 1 CAPSULE BY MOUTH EVERY DAY WITH 30 MG     Psychiatry: Antidepressants - SNRI - duloxetine  Passed - 10/31/2023 10:35 AM      Passed - Cr in normal range and within 360 days    Creat  Date Value Ref Range Status  07/08/2023 1.08 (H) 0.50 - 1.05 mg/dL Final   Creatinine, Ser  Date Value Ref Range Status  10/22/2023 0.91 0.44 - 1.00 mg/dL Final         Passed - eGFR is 30 or above and within 360 days    GFR calc Af Amer  Date Value Ref Range Status  11/14/2019 59 (L) >60 mL/min Final   GFR, Estimated  Date Value Ref Range Status  10/22/2023 >60 >60 mL/min Final    Comment:    (NOTE) Calculated using the CKD-EPI Creatinine Equation (2021)    eGFR  Date Value Ref Range Status  07/08/2023 58 (L) > OR = 60 mL/min/1.78m2 Final  02/10/2023 60 >59 mL/min/1.73 Final         Passed - Completed PHQ-2 or PHQ-9 in the last 360 days      Passed - Last BP in normal range    BP Readings from Last 1 Encounters:  10/28/23 128/82         Passed - Valid encounter within last 6 months    Recent Outpatient Visits           3 days ago Hospital discharge follow-up   [facility] Rockney Cid, DO   1 month ago Pre-op exam   [facility] Rockney Cid, DO   3 months ago Dysuria   [facility] Rockney Cid, DO       Future Appointments             In 1 week Estanislao Heimlich, Dennard Fisher, MD [facility] Urology [place]   In 1 week Kathreen Pare, PA-C [facility]  Urology [place]   In 4 weeks Rockney Cid, DO [facility]   In 2 months Elta Halter, MD [facility]

## 2023-10-31 NOTE — Telephone Encounter (Signed)
 Advised patient that we can't sent in any medication because she is not a current  patient. She is going to talk with her PCP.

## 2023-10-31 NOTE — Addendum Note (Signed)
 Addended by: Rockney Cid on: 10/31/2023 12:57 PM   Modules accepted: Level of Service

## 2023-10-31 NOTE — Telephone Encounter (Signed)
 Reviewed with Dr. Mont Antis. She needs to be seen no later than next week to have sutures removed.   Please let her know and schedule her to be seen next week for postop visit.

## 2023-10-31 NOTE — Progress Notes (Signed)
   REFERRING PHYSICIAN:  Chanetta Comes [address] [place],  [place] [address]  DOS: 10/17/23 MIS TLIF L4-L5 with PSF  HISTORY OF PRESENT ILLNESS: Victoria [name] is approximately 2 weeks status post above surgery. Was given fioricet , robaxin , fioricet , and oxycodone  on discharge from the hospital.   She had a CSF leak during surgery and was on bedrest for 2 days. Hospital course was complicated by pneumonia and postop respiratory failure- medicine was consulted for assistance.   She was discharged with foley catheter.   She continues with bilateral hip and groin pain. She has seen some improvement with her preop leg pain. She is still having a more constant headache with dizziness. Headache is not positional.   She is taking baclofen . She is not taking robaxin . She has been taking prn fiorcet and prn oxycodone . Needs refills of both.   PHYSICAL EXAMINATION:  General: Patient is well developed, well nourished, calm, collected, and in no apparent distress.   NEUROLOGICAL:  General: In no acute distress.   Awake, alert, oriented to person, place, and time.  Pupils equal round and reactive to light.  Facial tone is symmetric.     Strength:          Side Iliopsoas Quads Hamstring PF DF EHL  R 5 5 5 5 5 5   L 5 5 5 5 5 5    Incisions c/d/I, sutures removed without complication. No swelling noted at incisions.   She ambulates with a cane.   She has foley catheter.    ROS (Neurologic):  Negative except as noted above  IMAGING: Nothing new to review.   ASSESSMENT/PLAN:  Victoria [name] is doing fair s/p above surgery. Treatment options reviewed with Dr. Mont Antis and following plan made:   - I have advised the patient to lift up to 10 pounds until 6 weeks after surgery (follow up with Dr. Mont Antis).  - Reviewed wound care.  - No bending, twisting, or lifting.  - Continue on current medications including prn oxycodone , prn baclofen . Refill of  oxycodone  sent. PMP reviewed and is appropriate.  - Can continue on fioricet  as needed for headaches. Refill sent.  - Recommend she follow up with PCP for dizziness and blurry vision.  - Continue with HHPT.  - Follow up as scheduled in 4 weeks and prn.   Advised to contact the office if any questions or concerns arise.  Lucetta Russel PA-C Department of neurosurgery

## 2023-10-31 NOTE — Telephone Encounter (Signed)
 Patient called in today and states she is having urine leaking down her leg. Cathter is in place and no leaking from  the bag. Advised patient to make sure the stirps in close to her upper thigh and no down low.

## 2023-11-01 ENCOUNTER — Encounter: Payer: Self-pay | Admitting: Orthopedic Surgery

## 2023-11-01 ENCOUNTER — Ambulatory Visit (INDEPENDENT_AMBULATORY_CARE_PROVIDER_SITE_OTHER): Admitting: Orthopedic Surgery

## 2023-11-01 VITALS — BP 112/74 | Temp 98.4°F | Ht 65.0 in | Wt 205.0 lb

## 2023-11-01 DIAGNOSIS — M4316 Spondylolisthesis, lumbar region: Secondary | ICD-10-CM

## 2023-11-01 DIAGNOSIS — Z981 Arthrodesis status: Secondary | ICD-10-CM

## 2023-11-01 MED ORDER — OXYCODONE HCL 10 MG PO TABS: 10.0000 mg | ORAL_TABLET | Freq: Four times a day (QID) | ORAL | 0 refills | Status: AC | PRN

## 2023-11-01 MED ORDER — BUTALBITAL-APAP-CAFFEINE 50-325-40 MG PO TABS: 1.0000 | ORAL_TABLET | Freq: Four times a day (QID) | ORAL | 0 refills | Status: DC | PRN

## 2023-11-02 ENCOUNTER — Encounter: Payer: Self-pay | Admitting: Internal Medicine

## 2023-11-03 ENCOUNTER — Ambulatory Visit: Payer: Self-pay | Admitting: *Deleted

## 2023-11-03 NOTE — Telephone Encounter (Addendum)
   Answer Assessment - Initial Assessment Questions 1. SYMPTOMS: "What symptoms are you concerned about?"     Terrible pain- bladder spasms 2. ONSET:  "When did the symptoms start?"     Getting worse 3. FEVER: "Is there a fever?" If Yes, ask: "What is the temperature, how was it measured, and when did it start?"     *No Answer* 4. ABDOMEN PAIN: "Is there any abdomen pain?" (e.g., Scale 1-10; or mild, moderate, severe)     *No Answer* 5. URINE COLOR: "What color is the urine?"  "Is there blood present in the urine?" (e.g., clear, yellow, cloudy, tea-colored, blood streaks, bright red)     *No Answer* 6. URINE AMOUNT: "When did you last empty the urine from the collection bag?" "How much urine was in the bag at that time?" How much urine is in the collection bag now?"     *No Answer* 7. INSERTION: "How long have you (they) had the catheter?"     *No Answer* 8. OTHER SYMPTOMS: "Are there any other symptoms?" (e.g., abdomen swelling, back pain, bladder spasms, constipation, foul smelling urine, leaking of urine)      *No Answer* 9. MEDICINES: "Are you taking any medicines to treat urinary problems?" (e.g., antibiotics for a urinary tract infection, medicines to treat bladder spasms)      *No Answer* 10. PREGNANCY: "Is there any chance you are pregnant?" "When was your last menstrual period?"       *No Answer*  Protocols used: Urinary Catheter (e.g., Foley) Symptoms and Questions-A-AH         Copied from CRM [id number]. Topic: Clinical - Red Word Triage >> Nov 03, 2023  3:51 PM Bridgette Campus T wrote: Red Word that prompted transfer to Nurse Triage: extreme pain/spasms from catheter level 8 -had back surgery recently - meds not helping

## 2023-11-03 NOTE — Telephone Encounter (Signed)
 FYI Only or Action Required?: FYI only for provider  Patient was last seen in primary care on 10/28/2023 by Rockney Cid, DO. Called Nurse Triage reporting No chief complaint on file.. Symptoms began several days ago. Interventions attempted: OTC medications: AZO. Symptoms are: rapidly worsening.  Triage Disposition: Go to ED Now (Notify PCP)  Patient/caregiver understands and will follow disposition?: yes

## 2023-11-03 NOTE — Telephone Encounter (Signed)
 Reason for Disposition . SEVERE abdominal pain  Additional Information . Commented on: Answer Assessment    Chart closed during triage: 3. No fever 4. severe abdominal pain 5. Using AZO 7. Inserted before hospital discharge 8. Leakage- large amounts  Protocols used: Urinary Catheter (e.g., Foley) Symptoms and Questions-A-AH

## 2023-11-04 ENCOUNTER — Other Ambulatory Visit: Payer: Self-pay | Admitting: Internal Medicine

## 2023-11-04 ENCOUNTER — Telehealth: Payer: Self-pay | Admitting: Neurosurgery

## 2023-11-04 NOTE — Telephone Encounter (Signed)
 Fine with me regarding PT.

## 2023-11-04 NOTE — Telephone Encounter (Signed)
 Caller states that patient has been having bladder spasms this week so they have not been able to complete physical therapy this week. They will resume next week for 1 visit if that is okay with the provider.   [phone]

## 2023-11-04 NOTE — Telephone Encounter (Signed)
Giah notified.

## 2023-11-06 ENCOUNTER — Emergency Department

## 2023-11-06 ENCOUNTER — Encounter: Payer: Self-pay | Admitting: Emergency Medicine

## 2023-11-06 ENCOUNTER — Other Ambulatory Visit: Payer: Self-pay

## 2023-11-06 ENCOUNTER — Emergency Department
Admission: EM | Admit: 2023-11-06 | Discharge: 2023-11-06 | Disposition: A | Discharge status: Home / Self Care | Attending: Emergency Medicine | Admitting: Emergency Medicine

## 2023-11-06 DIAGNOSIS — N12 Tubulo-interstitial nephritis, not specified as acute or chronic: Secondary | ICD-10-CM

## 2023-11-06 DIAGNOSIS — Z743 Need for continuous supervision: Secondary | ICD-10-CM | POA: Diagnosis not present

## 2023-11-06 DIAGNOSIS — R109 Unspecified abdominal pain: Secondary | ICD-10-CM | POA: Diagnosis not present

## 2023-11-06 DIAGNOSIS — N1330 Unspecified hydronephrosis: Secondary | ICD-10-CM | POA: Diagnosis not present

## 2023-11-06 DIAGNOSIS — R1031 Right lower quadrant pain: Secondary | ICD-10-CM | POA: Diagnosis not present

## 2023-11-06 DIAGNOSIS — K760 Fatty (change of) liver, not elsewhere classified: Secondary | ICD-10-CM | POA: Diagnosis not present

## 2023-11-06 DIAGNOSIS — T83098A Other mechanical complication of other indwelling urethral catheter, initial encounter: Secondary | ICD-10-CM | POA: Insufficient documentation

## 2023-11-06 DIAGNOSIS — T839XXA Unspecified complication of genitourinary prosthetic device, implant and graft, initial encounter: Secondary | ICD-10-CM

## 2023-11-06 DIAGNOSIS — R110 Nausea: Secondary | ICD-10-CM | POA: Diagnosis not present

## 2023-11-06 DIAGNOSIS — N132 Hydronephrosis with renal and ureteral calculous obstruction: Secondary | ICD-10-CM | POA: Diagnosis not present

## 2023-11-06 DIAGNOSIS — R1084 Generalized abdominal pain: Secondary | ICD-10-CM | POA: Diagnosis not present

## 2023-11-06 LAB — COMPREHENSIVE METABOLIC PANEL WITH GFR
ALT: 62 U/L — ABNORMAL HIGH (ref 0–44)
AST: 62 U/L — ABNORMAL HIGH (ref 15–41)
Albumin: 3.6 g/dL (ref 3.5–5.0)
Alkaline Phosphatase: 206 U/L — ABNORMAL HIGH (ref 38–126)
Anion gap: 12 (ref 5–15)
BUN: 16 mg/dL (ref 8–23)
CO2: 25 mmol/L (ref 22–32)
Calcium: 9.7 mg/dL (ref 8.9–10.3)
Chloride: 100 mmol/L (ref 98–111)
Creatinine, Ser: 1.08 mg/dL — ABNORMAL HIGH (ref 0.44–1.00)
GFR, Estimated: 58 mL/min — ABNORMAL LOW (ref >60)
Glucose, Bld: 155 mg/dL — ABNORMAL HIGH (ref 70–99)
Potassium: 4.5 mmol/L (ref 3.5–5.1)
Sodium: 137 mmol/L (ref 135–145)
Total Bilirubin: 0.7 mg/dL (ref 0.0–1.2)
Total Protein: 8.2 g/dL — ABNORMAL HIGH (ref 6.5–8.1)

## 2023-11-06 LAB — URINALYSIS, ROUTINE W REFLEX MICROSCOPIC
Bacteria, UA: NONE SEEN
Bilirubin Urine: NEGATIVE
Glucose, UA: NEGATIVE mg/dL
Ketones, ur: NEGATIVE mg/dL
Nitrite: NEGATIVE
Protein, ur: 100 mg/dL — AB
RBC / HPF: >50 RBC/hpf (ref 0–5)
Specific Gravity, Urine: 1.019 (ref 1.005–1.030)
WBC, UA: >50 WBC/hpf (ref 0–5)
pH: 5 (ref 5.0–8.0)

## 2023-11-06 LAB — CBC
HCT: 43.6 % (ref 36.0–46.0)
Hemoglobin: 14 g/dL (ref 12.0–15.0)
MCH: 30.5 pg (ref 26.0–34.0)
MCHC: 32.1 g/dL (ref 30.0–36.0)
MCV: 95 fL (ref 80.0–100.0)
Platelets: 336 10*3/uL (ref 150–400)
RBC: 4.59 MIL/uL (ref 3.87–5.11)
RDW: 14.3 % (ref 11.5–15.5)
WBC: 9.3 10*3/uL (ref 4.0–10.5)
nRBC: 0 % (ref 0.0–0.2)

## 2023-11-06 LAB — LIPASE, BLOOD: Lipase: 25 U/L (ref 11–51)

## 2023-11-06 MED ORDER — HYDROMORPHONE HCL 1 MG/ML IJ SOLN
1.0000 mg | Freq: Once | INTRAMUSCULAR | Status: AC
  Administered 2023-11-06: 1 mg via INTRAVENOUS
  Filled 2023-11-06: qty 1

## 2023-11-06 MED ORDER — LEVOFLOXACIN 500 MG PO TABS
750.0000 mg | ORAL_TABLET | Freq: Once | ORAL | Status: AC
  Administered 2023-11-06: 750 mg via ORAL
  Filled 2023-11-06: qty 2

## 2023-11-06 MED ORDER — OXYBUTYNIN CHLORIDE ER 10 MG PO TB24
10.0000 mg | ORAL_TABLET | ORAL | Status: AC
  Administered 2023-11-06: 10 mg via ORAL
  Filled 2023-11-06: qty 1

## 2023-11-06 MED ORDER — MORPHINE SULFATE (PF) 4 MG/ML IV SOLN
4.0000 mg | Freq: Once | INTRAVENOUS | Status: AC
  Administered 2023-11-06: 4 mg via INTRAVENOUS
  Filled 2023-11-06: qty 1

## 2023-11-06 MED ORDER — LEVOFLOXACIN 750 MG PO TABS: 750.0000 mg | ORAL_TABLET | Freq: Every day | ORAL | 0 refills | Status: AC

## 2023-11-06 NOTE — ED Triage Notes (Signed)
 BIB ems for abd pain that radiates to back on right side, had L4 & L5 on 5/19 has had a indwelling catheter since 5/21.  Per pt "I have been having the pain since getting the catheter, but the pain has just gotten worse tonight. I think I might have a kidney infection."

## 2023-11-06 NOTE — ED Notes (Signed)
Pt verbalizes understanding of discharge instructions. Opportunity for questioning and answers were provided. Pt discharged from ED to home with husband.    

## 2023-11-06 NOTE — ED Notes (Signed)
 Patient to ED waiting room via wheelchair by EMS from home.  Per EMS patient had L 4 & 5 fusion on May 19th.  Patient reports right lower quad pain that radiates into her back since the procedure also having nausea.  Patient also has an indwelling foley cath since the procedure that is causing some pain and now has a red tint to it (patient unsure if its blood or because she took AZO).  EMS interventions -- temp 98.4, HR 78, BP 112/73, pulse oxi 90% on room air up to 95% with O2 at 3 liters, cbg 115.

## 2023-11-06 NOTE — ED Provider Notes (Signed)
 [facility] Provider Note    Event Date/Time   First MD Initiated Contact with Patient 11/06/23 [phone]     (approximate)   History   Abdominal Pain   HPI  Victoria [name] is a 63 y.o. female was about 3 week status post spinal procedure including fusion complicated by CSF leak during surgery   Patient reports that since surgery she has not been able to urinate.  She has had a catheter in.  It has been causing her continuous pain in her lower abdomen and some radiation towards her right flank.  She reports she talked to her neurosurgeon about this in the have made her an appointment with urology that scheduled for June 12  She continues to have pain.  She started to take Azo today as well but she reports she is wonder if it could be started to get infected.  She reports that no fever is not any burning or stinging feeling but ongoing and uncomfortable pain mostly over the "bladder"  She reports she has had intermittent nausea and vomiting since surgery and taking Zofran  for that, it is not new.  No headache no new numbness or weakness but has chronic neuropathy.  No weakness in the legs.       Physical Exam   Triage Vital Signs: ED Triage Vitals [11/06/23 0430]  Encounter Vitals Group     BP      Systolic BP Percentile      Diastolic BP Percentile      Pulse      Resp      Temp      Temp src      SpO2      Weight 198 lb (89.8 kg)     Height 5\' 4"  (1.626 m)     Head Circumference      Peak Flow      Pain Score      Pain Loc      Pain Education      Exclude from Growth Chart     Most recent vital signs: Vitals:   11/06/23 0546 11/06/23 0600  BP: 138/78 (!) 160/72  Pulse: 78 78  Resp: 20   Temp: 98.7 F (37.1 C)   SpO2: 94% 93%     General: Awake, no distress.  Pleasant. CV:  Good peripheral perfusion.  Resp:  Normal effort.  Clear lung sounds.  No hypoxia.  Patient does report occasionally she will use 2 L nasal cannula due to  chronic lung disease and recently had pneumonia but no cough no shortness of breath her symptoms are worsening.  She did have a little bit of shortness of breath going into her or around the time of her surgery she completed antibiotic course Abd:  No distention.  Mild tenderness to palpation across the suprapubic region primarily and some into the right flank.  No rebound or guarding.  Negative Murphy no obvious focal tenderness at McBurney's point over the epigastrium. Other:  Warm well-perfused extremities.  Examined with nurse Triston at the bedside.  The Foley catheter clamp notably the portion of the Foley catheter coming through the urethra is kinked at the tube holding device.  It was taken out of the tube holder and unkinked, suspected got twisted when she was changing out the tube holding device.  Patient reports her husband has helped her unclip include but backend.  After being unkinked patient has about 100 mL of orangeish forward light reddish tinged  urine.  Of note the patient also reports taking Azo earlier   ED Results / Procedures / Treatments   Labs (all labs ordered are listed, but only abnormal results are displayed) Labs Reviewed  COMPREHENSIVE METABOLIC PANEL WITH GFR - Abnormal; Notable for the following components:      Result Value   Glucose, Bld 155 (*)    Creatinine, Ser 1.08 (*)    Total Protein 8.2 (*)    AST 62 (*)    ALT 62 (*)    Alkaline Phosphatase 206 (*)    GFR, Estimated 58 (*)    All other components within normal limits  URINALYSIS, ROUTINE W REFLEX MICROSCOPIC - Abnormal; Notable for the following components:   Color, Urine AMBER (*)    APPearance HAZY (*)    Hgb urine dipstick LARGE (*)    Protein, ur 100 (*)    Leukocytes,Ua MODERATE (*)    All other components within normal limits  URINE CULTURE  LIPASE, BLOOD  CBC     EKG     RADIOLOGY  CT Renal Stone Study Result Date: 11/06/2023 CLINICAL DATA:  Flank pain. EXAM: CT ABDOMEN AND  PELVIS WITHOUT CONTRAST TECHNIQUE: Multidetector CT imaging of the abdomen and pelvis was performed following the standard protocol without IV contrast. RADIATION DOSE REDUCTION: This exam was performed according to the departmental dose-optimization program which includes automated exposure control, adjustment of the mA and/or kV according to patient size and/or use of iterative reconstruction technique. COMPARISON:  None Available. FINDINGS: Lower chest: No acute abnormality. No pleural or pericardial effusions. Hepatobiliary: Hypodensity consistent with hepatic fatty infiltration. No focal liver abnormality is seen. No gallstones, gallbladder wall thickening, or biliary dilatation. Pancreas: Unremarkable. No pancreatic ductal dilatation or surrounding inflammatory changes. Spleen: Normal in size without focal abnormality. Adrenals/Urinary Tract: Adrenal glands are unremarkable. Right-sided hydronephrosis. No stones. This could indicate pyelonephritis or recent passage of a stone. No nephrolithiasis on the left. The urinary bladder is empty with a Foley catheter. Stomach/Bowel: Stomach is within normal limits. Appendix not visualized. No evidence of bowel wall thickening, distention, or inflammatory changes. Vascular/Lymphatic: Aortic atherosclerosis. No enlarged abdominal or pelvic lymph nodes. Reproductive: Status post hysterectomy. No adnexal masses. Other: No abdominal wall hernia or abnormality. No abdominopelvic ascites. Musculoskeletal: L4-5 discectomy and posterior fusion. IMPRESSION: 1. Right-sided hydronephrosis. Pyelonephritis or recent passage of a stone could have this appearance. 2. Fatty infiltration of the liver. 3. Aortic atherosclerosis (ICD10-I70.0). Electronically Signed   By: Sydell Eva M.D.   On: 11/06/2023 07:05      PROCEDURES:  Critical Care performed: No  Procedures   MEDICATIONS ORDERED IN ED: Medications  oxybutynin (DITROPAN-XL) 24 hr tablet 10 mg (has no  administration in time range)  levofloxacin  (LEVAQUIN ) tablet 750 mg (has no administration in time range)  HYDROmorphone  (DILAUDID ) injection 1 mg (has no administration in time range)  morphine (PF) 4 MG/ML injection 4 mg (4 mg Intravenous Given 11/06/23 0640)     IMPRESSION / MDM / ASSESSMENT AND PLAN / ED COURSE  I reviewed the triage vital signs and the nursing notes.                              Differential diagnosis includes, but is not limited to, Foley catheter malfunction, inflammation, malposition, urinary tract infection, pyelonephritis, obstruction, kidney stones or referred intra-abdominal pain etc.  She has had previous evaluation with neurosurgery and as well as hospital service  for cause of urinary retention, and patient reports referred to urology by her neurosurgical team as well.  She has upcoming appointment in about 4 days    Patient's presentation is most consistent with acute complicated illness / injury requiring diagnostic workup.   Will obtain CT imaging to evaluate for cause of symptomatology, also evaluate for structural abnormalities urinary obstruction etc.   Clinical Course as of 11/06/23 0747  Sun Nov 06, 2023  0539 LFTs noted to be slightly elevated, similar to previous actually slight improvement in AST.  Also appears the patient has been evaluated by GI and found to have steatosis [MQ]  0703 S/o from Dr. Valetta Gaudy: 45F s/p L spine fusion in mid May, subsequent urinary retention. NSGY evaluated, felt unrelated to surgery. Foley since then. Uro referral in place. +suprapubic pain radiating to R flank.  Appt on 6/12 w/ uro. No infectious sx. Had kinked catheter, unkinked.   TO DO: - f/u CT - f/u UA   [MM]  0735 Dr. Valetta Gaudy d/w on-call urologist - Recommends Foley exchange, self initiate antibiotics, follow-up in urology clinic as already planned on 11/10/23 [MM]  0745 Patient reevaluated, findings discussed.  Overall believe she had a kinking of her Foley  catheter which caused urinary retention/hydronephrosis and may have contributed to urinary tract infection.  Fortunately no evidence of systemic infection at this time with no fever, no leukocytosis, no vomiting.  CT is concerning for pyelonephritis however.  Based on prior antibiotic allergies, will start patient on levofloxacin  and exchange Foley catheter here.  Patient appears very well with stable vital signs, not clinically concern for sepsis at this time and fortunately we should be able to resolve any obstruction by replacing Foley catheter, no underlying masses or stones appreciated on CT.  Patient agrees with plan.  ED return precautions in place.  Will plan for discharge home after first dose of antibiotics and Foley exchange. [MM]    Clinical Course User Index [MM] Collis Deaner, MD [MQ] Iver Marker, MD   Ongoing care assigned to Dr. Cam Cava at 710am with plan to follow-up on UA, CT imaging and plan.  In the interim discussed case with our urologist who recommends insertion of new Foley catheter, and would be reasonable to discharge with antibiotics if the patient's condition overall appears well.  At this point no evidence of sepsis patient reports symptoms for a few weeks time now but slowly increasing and worsening.  Anticipate insertion of new Foley catheter, likely discharge to home with antibiotics.  Close outpatient follow-up and urology follow-up which she has already is scheduled  FINAL CLINICAL IMPRESSION(S) / ED DIAGNOSES    Note:  This document was prepared using Dragon voice recognition software and may include unintentional dictation errors.   Iver Marker, MD 11/06/23 [phone]

## 2023-11-06 NOTE — ED Provider Notes (Signed)
  Physical Exam  BP (!) 160/72   Pulse 78   Temp 98.7 F (37.1 C) (Oral)   Resp 20   Ht 5\' 4"  (1.626 m)   Wt 89.8 kg   SpO2 93%   BMI 33.99 kg/m   Physical Exam  Procedures  Procedures  ED Course / MDM   Clinical Course as of 11/06/23 0746  Sun Nov 06, 2023  0539 LFTs noted to be slightly elevated, similar to previous actually slight improvement in AST.  Also appears the patient has been evaluated by GI and found to have steatosis [MQ]  0703 S/o from Dr. Valetta Gaudy: 34F s/p L spine fusion in mid May, subsequent urinary retention. NSGY evaluated, felt unrelated to surgery. Foley since then. Uro referral in place. +suprapubic pain radiating to R flank.  Appt on 6/12 w/ uro. No infectious sx. Had kinked catheter, unkinked.   TO DO: - f/u CT - f/u UA   [MM]  0735 Dr. Valetta Gaudy d/w on-call urologist - Recommends Foley exchange, self initiate antibiotics, follow-up in urology clinic as already planned on 11/10/23 [MM]  0745 Patient reevaluated, findings discussed.  Overall believe she had a kinking of her Foley catheter which caused urinary retention/hydronephrosis and may have contributed to urinary tract infection.  Fortunately no evidence of systemic infection at this time with no fever, no leukocytosis, no vomiting.  CT is concerning for pyelonephritis however.  Based on prior antibiotic allergies, will start patient on levofloxacin  and exchange Foley catheter here.  Patient appears very well with stable vital signs, not clinically concern for sepsis at this time and fortunately we should be able to resolve any obstruction by replacing Foley catheter, no underlying masses or stones appreciated on CT.  Patient agrees with plan.  ED return precautions in place.  Will plan for discharge home after first dose of antibiotics and Foley exchange. [MM]    Clinical Course User Index [MM] Collis Deaner, MD [MQ] Iver Marker, MD   Medical Decision Making Amount and/or Complexity of Data  Reviewed Labs: ordered. Radiology: ordered.  Risk Prescription drug management.          Collis Deaner, MD 11/06/23 [phone]

## 2023-11-06 NOTE — Discharge Instructions (Addendum)
 Your evaluation in the emergency department was notable for evidence of urinary tract and kidney infection.  We have started you on antibiotics to treat this.  We also believe your Foley catheter was kinked, and this was replaced here in the emergency department.  Your case was discussed with the urologist, they recommend you follow-up with them on 11/10/2023 as already planned.  Return to the emergency department with any new or worsening symptoms.

## 2023-11-07 LAB — URINE CULTURE: Culture: NO GROWTH

## 2023-11-10 ENCOUNTER — Ambulatory Visit (INDEPENDENT_AMBULATORY_CARE_PROVIDER_SITE_OTHER): Admitting: Physician Assistant

## 2023-11-10 ENCOUNTER — Ambulatory Visit: Admitting: Urology

## 2023-11-10 VITALS — BP 94/64 | HR 94

## 2023-11-10 VITALS — BP 94/64 | HR 94 | Ht 64.25 in

## 2023-11-10 DIAGNOSIS — N1330 Unspecified hydronephrosis: Secondary | ICD-10-CM | POA: Diagnosis not present

## 2023-11-10 DIAGNOSIS — R339 Retention of urine, unspecified: Secondary | ICD-10-CM

## 2023-11-10 LAB — BLADDER SCAN AMB NON-IMAGING: Scan Result: 1

## 2023-11-10 NOTE — Progress Notes (Signed)
 She returned to clinic this afternoon for PVR. She has voided without difficulty. PVR 1mL.  Results for orders placed or performed in visit on 11/10/23  BLADDER SCAN AMB NON-IMAGING   Collection Time: 11/10/23  4:11 PM  Result Value Ref Range   Scan Result 1 ml     Voiding trial passed. Complete antibiotics as prescribed.  Return in about 4 weeks (around 12/08/2023) for Repeat PVR.

## 2023-11-10 NOTE — Progress Notes (Signed)
Catheter Removal  Patient is present today for a catheter removal. 9ml of water was drained from the balloon. A 16FR foley cath was removed from the bladder, no complications were noted. Patient tolerated well.  Performed by: Manas Hickling, CMA   Follow up/ Additional notes: RTC this afternoon for PVR   

## 2023-11-10 NOTE — Patient Instructions (Signed)
 Continue the Levaquin  antibiotic.  You can stop tamsulosin (Flomax ) and solfenecin(Vesicare )

## 2023-11-10 NOTE — Progress Notes (Signed)
 11/10/23 8:23 AM   Randolm Butte 03-Jan-1961 [id number]  CC: Urinary retention, right hydronephrosis  HPI: 63 year old female with chronic back pain who underwent L4/5 fusion with neurosurgery on 10/17/2023.  Sounds like she had problems with significant constipation as well as urinary retention that may have been related and a Foley catheter was placed, PVR/bladder scan .  She was discharged with urology follow-up for voiding trial.  She was also seen in the ER on 11/06/2023 with abdominal and right-sided flank pain, felt to have UTI and started on antibiotics.  Urine culture was ultimately negative.  CT scan showed some mild right-sided hydronephrosis, bladder was decompressed with Foley, however per the notes there was some concern that the Foley catheter had been kinked prior.  Prior to surgery, she denies any significant urinary symptoms aside from some occasional urge incontinence.  She has a reported history of a bladder tack at time of hysterectomy approximately 15 years ago.  She denies any recurrent UTIs prior.  CT scan from March 2025 showed no hydronephrosis or other urologic abnormalities  PMH: Past Medical History:  Diagnosis Date   Asthma    Cervical spinal stenosis    Complication of anesthesia    Dyspnea    Dysrhythmia    Fatty liver    GERD (gastroesophageal reflux disease)    Hypercholesterolemia    Hypertension    Lumbar stenosis    Lyme disease    Melanocarcinoma (HCC)    Osteoarthritis of both hips    Pre-diabetes    Psychotic disorder (HCC)    Pulmonary fibrosis (HCC)    Sleep apnea     Surgical History: Past Surgical History:  Procedure Laterality Date   ABDOMINAL HYSTERECTOMY     APPENDECTOMY  2006   APPLICATION OF INTRAOPERATIVE CT SCAN N/A 10/17/2023   Procedure: APPLICATION OF INTRAOPERATIVE CT SCAN;  Surgeon: Jodeen Munch, MD;  Location: [facility] ORS;  Service: Neurosurgery;  Laterality: N/A;   BREAST EXCISIONAL BIOPSY Right 1997    neg   ESOPHAGOGASTRODUODENOSCOPY (EGD) WITH PROPOFOL  N/A 09/20/2022   Procedure: ESOPHAGOGASTRODUODENOSCOPY (EGD) WITH PROPOFOL ;  Surgeon: Luke Salaam, MD;  Location: [facility] END[facility]COPY;  Service: Gastroenterology;  Laterality: N/A;   excision of melanoma on Right arm     HIATAL HERNIA REPAIR     Jan 2017   LAPAROSCOPY  2020   TRANSFORAMINAL LUMBAR INTERBODY FUSION W/ MIS 1 LEVEL N/A 10/17/2023   Procedure: L4-5 MINIMALLY INVASIVE (MIS) TRANSFORAMINAL LUMBAR INTERBODY FUSION (TLIF);  Surgeon: Jodeen Munch, MD;  Location: [facility] ORS;  Service: Neurosurgery;  Laterality: N/A;  L4-5 MINIMALLY INVASIVE (MIS) TRANSFORAMINAL LUMBAR INTERBODY FUSION (TLIF)     Family History: Family History  Problem Relation Age of Onset   Alcohol  abuse Mother    Arthritis Mother    Heart disease Mother    Liver disease Mother    Osteoporosis Mother    Clotting disorder Mother    Diabetes Father    Arthritis Father    Stroke Father    Sarcoidosis Father    Heart failure Father    Bipolar disorder Sister    Schizophrenia Sister    Depression Sister    Anxiety disorder Sister    Alcohol  abuse Sister    Diabetes Sister    Arthritis Sister    Thyroid  disease Sister    Lupus Sister    Depression Sister    Anxiety disorder Sister    Alcohol  abuse Sister    Depression Sister    Anxiety disorder Sister  Ehlers-Danlos syndrome Daughter     Social History:  reports that she quit smoking about 38 years ago. Her smoking use included cigarettes. She has never used smokeless tobacco. She reports that she does not drink alcohol  and does not use drugs.  Physical Exam: BP 94/64 (BP Location: Left Arm, Patient Position: Standing, Cuff Size: Large)   Pulse 94   Ht 5' 4.25 (1.632 m)   BMI 33.72 kg/m    Constitutional:  Alert and oriented, No acute distress. Cardiovascular: No clubbing, cyanosis, or edema. Respiratory: Normal respiratory effort, no increased work of breathing. GI: Abdomen is soft,  nontender, nondistended, no abdominal masses   Laboratory Data: Reviewed, see HPI  Pertinent Imaging: I have personally viewed and interpreted the most recent CT scan showing mild right hydronephrosis, bladder decompressed with Foley, as well as the prior CT that was benign from March 2025 from a urologic perspective.  Assessment & Plan:   Comorbid 63 year old female with urinary retention after L4/5 fusion with neurosurgery on 10/17/2023, likely a combination of factors including postop state/mobility, narcotics, constipation.  No significant urinary symptoms prior to surgery, and bladder was completely decompressed on a preop CT from March 2025.  Recently seen in the ER with possible infection but culture was negative, mild right hydronephrosis likely to Foley catheter being kinked, and low suspicion for additional pathology.  I recommended stopping the Flomax  and Vesicare , she can finish the course of Levaquin  prescribed by the ER.  Foley catheter was removed this morning, she returned this afternoon and has been able to void normally, PVR normal at 1ml.  Follow-up with urology as needed  Jay Meth, MD 11/10/2023  [facility] [address], [address] [place], [place] [address] [phone]

## 2023-11-13 ENCOUNTER — Other Ambulatory Visit: Payer: Self-pay | Admitting: Neurosurgery

## 2023-11-15 ENCOUNTER — Encounter: Payer: Self-pay | Admitting: Internal Medicine

## 2023-11-15 ENCOUNTER — Other Ambulatory Visit: Payer: Self-pay | Admitting: Neurosurgery

## 2023-11-15 ENCOUNTER — Encounter: Payer: Self-pay | Admitting: Neurosurgery

## 2023-11-15 ENCOUNTER — Other Ambulatory Visit: Payer: Self-pay | Admitting: Internal Medicine

## 2023-11-15 MED ORDER — LOSARTAN POTASSIUM 25 MG PO TABS: 25.0000 mg | ORAL_TABLET | Freq: Every day | ORAL | 0 refills | Status: DC

## 2023-11-15 NOTE — Telephone Encounter (Signed)
 Dr. Mont Antis gave her losartan  #30 on 10/22/23.   PCP says she needs to be seen prior to any additional refills. I am not comfortable refilling blood pressure medications.   Please let me know if you will refill or if I need to let her know to get from PCP.

## 2023-11-17 ENCOUNTER — Encounter: Payer: Self-pay | Admitting: Internal Medicine

## 2023-11-17 ENCOUNTER — Ambulatory Visit: Admitting: Family Medicine

## 2023-11-17 ENCOUNTER — Encounter: Payer: Self-pay | Admitting: Family Medicine

## 2023-11-17 ENCOUNTER — Other Ambulatory Visit: Payer: Self-pay | Admitting: Internal Medicine

## 2023-11-17 VITALS — BP 126/74 | HR 94 | Resp 16 | Ht 64.25 in | Wt 204.1 lb

## 2023-11-17 DIAGNOSIS — R5383 Other fatigue: Secondary | ICD-10-CM

## 2023-11-17 DIAGNOSIS — I10 Essential (primary) hypertension: Secondary | ICD-10-CM

## 2023-11-17 DIAGNOSIS — F333 Major depressive disorder, recurrent, severe with psychotic symptoms: Secondary | ICD-10-CM

## 2023-11-17 DIAGNOSIS — G9782 Other postprocedural complications and disorders of nervous system: Secondary | ICD-10-CM

## 2023-11-17 DIAGNOSIS — G9600 Cerebrospinal fluid leak, unspecified: Secondary | ICD-10-CM

## 2023-11-17 DIAGNOSIS — R42 Dizziness and giddiness: Secondary | ICD-10-CM | POA: Diagnosis not present

## 2023-11-17 DIAGNOSIS — Z9889 Other specified postprocedural states: Secondary | ICD-10-CM | POA: Diagnosis not present

## 2023-11-17 DIAGNOSIS — Z87448 Personal history of other diseases of urinary system: Secondary | ICD-10-CM | POA: Diagnosis not present

## 2023-11-17 DIAGNOSIS — M48061 Spinal stenosis, lumbar region without neurogenic claudication: Secondary | ICD-10-CM

## 2023-11-17 DIAGNOSIS — M4316 Spondylolisthesis, lumbar region: Secondary | ICD-10-CM

## 2023-11-17 MED ORDER — DULOXETINE HCL 30 MG PO CPEP: 90.0000 mg | ORAL_CAPSULE | Freq: Every day | ORAL | 1 refills | Status: DC

## 2023-11-17 MED ORDER — QUETIAPINE FUMARATE 100 MG PO TABS: 100.0000 mg | ORAL_TABLET | Freq: Two times a day (BID) | ORAL | 1 refills | Status: DC

## 2023-11-17 MED ORDER — LOSARTAN POTASSIUM 25 MG PO TABS: 25.0000 mg | ORAL_TABLET | Freq: Every day | ORAL | 0 refills | Status: DC

## 2023-11-17 MED ORDER — TRAZODONE HCL 50 MG PO TABS: 50.0000 mg | ORAL_TABLET | Freq: Every day | ORAL | 1 refills | Status: DC

## 2023-11-17 NOTE — Progress Notes (Signed)
 Name: Victoria [name]   MRN: [id number]    DOB: 09-28-1960   Date:11/17/2023       Progress Note  Subjective  Chief Complaint  Chief Complaint  Patient presents with   Dizziness    Was seen at the ER feeling the same sometimes worst, random episodes   Nausea   Discussed the use of AI scribe software for clinical note transcription with the patient, who gave verbal consent to proceed.  History of Present Illness Victoria [name] is a 63 year old female who presents with dizziness, fatigue, and complications following spinal surgery.  She underwent a transformative lumbar interbody fusion on Oct 17, 2023. Post-operatively, she experienced a moderate leak of spinal cord fluid, necessitating a week of bed rest in the ICU. She was discharged on Oct 22, 2023. After discharge, she developed significant bladder pain due to a kinked catheter, leading to urinary retention and pyelonephritis. She was treated with antibiotics, including Levaquin , and the catheter was removed without complications. During the infection, her urine was described as 'coffee colored'.  She has been experiencing fluctuating blood pressure, with episodes of hypertension during an emergency room visit on November 06, 2023, and hypotension at other times. She is currently taking losartan  25 mg and is monitoring her blood pressure closely.  She reports ongoing dizziness, fatigue, and heavy legs, which she attributes to her recent surgery and prolonged bed rest. She also notes numbness in both feet and legs.  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 . She is having difficulty obtaining refills for her psychiatric medications and has been using trazodone  and Seroquel  for sleep and mood ( old rx from previous psychiatrist and waiting to see Dr. Bradford [name] locally) . No recent psychotic episodes.   She is gradually reducing her oxycodone  use, currently taking three  pills a day, and wants to discontinue it due to long-term use and its impact on her mood.    Patient Active Problem List   Diagnosis Date Noted   Acute respiratory failure with hypoxia (HCC) 10/18/2023   Obesity (BMI 30-39.9) 10/18/2023   Chronic bilateral low back pain with bilateral sciatica 10/17/2023   Spinal instability, lumbar 10/17/2023   Postoperative CSF leak 10/17/2023   Spondylolisthesis 10/17/2023   Nausea and vomiting 09/20/2022   At low risk for fall 06/16/2022   Hyperlipidemia 09/29/2021   OSA (obstructive sleep apnea) 09/29/2021   Vitamin D  deficiency 09/29/2021   Long term (current) use of opiate analgesic 04/06/2021   Issue of repeat prescription for medication 04/06/2021   SVT (supraventricular tachycardia) (HCC) 04/09/2020   Vaginal atrophy 04/09/2020   Class 1 obesity due to excess calories with serious comorbidity and body mass index (BMI) of 31.0 to 31.9 in adult 02/07/2020   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   Chronic fatigue 01/07/2020   History of melanoma 01/07/2020   Endometriosis 01/07/2020   Pulmonary fibrosis (HCC) 12/12/2019   Nonalcoholic steatohepatitis 12/12/2019   Convulsions (HCC) 12/12/2019   Irritable bowel syndrome 12/12/2019   Osteopenia 12/12/2019   Pernicious anemia 12/12/2019   Prediabetes 12/12/2019   Traumatic brain injury (HCC) 12/12/2019   Psychosis (HCC) 11/15/2019   Chronic pain syndrome 11/15/2019   Corneal abrasion 11/15/2019   Suicidal thoughts 11/15/2019   MDD (major depressive disorder), single episode, severe with psychosis (HCC) 11/14/2019   Migraine 07/30/2019   Stage 3a chronic kidney disease (HCC) 07/25/2019   Spinal stenosis of lumbar  region with neurogenic claudication 07/17/2019   Skin lesion 01/02/2019   Spondylolisthesis of lumbar region 04/24/2018   Numbness and tingling in both hands 12/30/2017   Cervical radiculopathy 10/25/2017   Pain medication agreement 10/25/2017   Sacroiliitis (HCC)  05/03/2016   Neuropathy 01/06/2016   Right hip pain 01/06/2016   MDD (major depressive disorder), recurrent, severe, with psychosis (HCC)    Osteoarthritis of both hips 07/06/2015   Dyslipidemia 07/06/2015   Hypertension 07/06/2015   GERD (gastroesophageal reflux disease) 07/06/2015   Asthma 07/06/2015   Bipolar I disorder, most recent episode manic, severe with psychotic features (HCC) 07/06/2015   Attention-deficit hyperactivity disorder, predominantly inattentive type 02/25/2014   Elevation of levels of liver transaminase levels 01/02/2014   Impaired fasting glucose 01/02/2014   Melanosis coli 09/20/2012   Screening for colon cancer 09/07/2012   Lactose intolerance 03/06/2012   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   Mild persistent asthma 03/26/2009   Acquired absence of other genital organ(s) 10/30/2008   Hx of total hysterectomy 10/30/2008   Lateral epicondylitis, unspecified elbow 08/03/2006   Tinnitus, unspecified ear 08/11/2004    Past Surgical History:  Procedure Laterality Date   ABDOMINAL HYSTERECTOMY     APPENDECTOMY  2006   APPLICATION OF INTRAOPERATIVE CT SCAN N/A 10/17/2023   Procedure: APPLICATION OF INTRAOPERATIVE CT SCAN;  Surgeon: Jodeen Munch, MD;  Location: [facility] ORS;  Service: Neurosurgery;  Laterality: N/A;   BREAST EXCISIONAL BIOPSY Right 1997   neg   ESOPHAGOGASTRODUODENOSCOPY (EGD) WITH PROPOFOL  N/A 09/20/2022   Procedure: ESOPHAGOGASTRODUODENOSCOPY (EGD) WITH PROPOFOL ;  Surgeon: Luke Salaam, MD;  Location: [facility] ENDOSCOPY;  Service: Gastroenterology;  Laterality: N/A;   excision of melanoma on Right arm     HIATAL HERNIA REPAIR     Jan 2017   LAPAROSCOPY  2020   TRANSFORAMINAL LUMBAR INTERBODY FUSION W/ MIS 1 LEVEL N/A 10/17/2023   Procedure: L4-5 MINIMALLY INVASIVE (MIS) TRANSFORAMINAL LUMBAR INTERBODY FUSION (TLIF);  Surgeon: Jodeen Munch, MD;  Location: [facility] ORS;  Service: Neurosurgery;  Laterality: N/A;  L4-5 MINIMALLY INVASIVE (MIS)  TRANSFORAMINAL LUMBAR INTERBODY FUSION (TLIF)    Family History  Problem Relation Age of Onset   Alcohol  abuse Mother    Arthritis Mother    Heart disease Mother    Liver disease Mother    Osteoporosis Mother    Clotting disorder Mother    Diabetes Father    Arthritis Father    Stroke Father    Sarcoidosis Father    Heart failure Father    Bipolar disorder Sister    Schizophrenia Sister    Depression Sister    Anxiety disorder Sister    Alcohol  abuse Sister    Diabetes Sister    Arthritis Sister    Thyroid  disease Sister    Lupus Sister    Depression Sister    Anxiety disorder Sister    Alcohol  abuse Sister    Depression Sister    Anxiety disorder Sister    Ehlers-Danlos syndrome Daughter     Social History   Tobacco Use   Smoking status: Former    Current packs/day: 0.00    Types: Cigarettes    Quit date: 1987    Years since quitting: 38.4   Smokeless tobacco: Never  Substance Use Topics   Alcohol  use: No    Alcohol /week: 0.0 standard drinks of alcohol      Current Outpatient Medications:    albuterol  (VENTOLIN  HFA) 108 (90 Base) MCG/ACT inhaler, Inhale 1-2 puffs into the lungs every 6 (six) hours  as needed for wheezing or shortness of breath., Disp: 18 g, Rfl: 1   B Complex Vitamins (B-COMPLEX/B-12 PO), Take by mouth., Disp: , Rfl:    baclofen  (LIORESAL ) 10 MG tablet, Take 10 mg by mouth 3 (three) times daily., Disp: , Rfl:    benzonatate  (TESSALON  PERLES) 100 MG capsule, Take 1 capsule (100 mg total) by mouth every 6 (six) hours as needed for cough., Disp: 30 capsule, Rfl: 0   benzonatate  (TESSALON ) 100 MG capsule, Take 1 capsule (100 mg total) by mouth 2 (two) times daily as needed for cough., Disp: 20 capsule, Rfl: 0   cholecalciferol  (VITAMIN D ) 25 MCG tablet, Take 1 tablet (1,000 Units total) by mouth daily., Disp: 30 tablet, Rfl: 1   conjugated estrogens (PREMARIN) vaginal cream, Place 1 applicator vaginally once a week., Disp: , Rfl:    DULoxetine   (CYMBALTA ) 30 MG capsule, Take 1 capsule (30 mg total) by mouth 3 (three) times daily., Disp: 90 capsule, Rfl: 0   fluticasone  furoate-vilanterol (BREO ELLIPTA ) 200-25 MCG/ACT AEPB, Inhale 1 puff into the lungs daily., Disp: 28 each, Rfl: 1   gabapentin  (NEURONTIN ) 300 MG capsule, Take 3 capsules (900 mg total) by mouth 3 (three) times daily., Disp: 90 capsule, Rfl: 0   ipratropium-albuterol  (DUONEB) 0.5-2.5 (3) MG/3ML SOLN, Take 3 mLs by nebulization 3 (three) times daily as needed (cough wheeze SOB bronchitis/asthma symptoms)., Disp: 180 mL, Rfl: 1   ketoconazole  (NIZORAL ) 2 % cream, Apply 1 Application topically daily., Disp: 15 g, Rfl: 0   losartan  (COZAAR ) 25 MG tablet, Take 1 tablet (25 mg total) by mouth daily., Disp: 30 tablet, Rfl: 0   miconazole (MONISTAT 1 COMBINATION PACK) kit, Place 1 each vaginally once., Disp: , Rfl:    MOVANTIK  25 MG TABS tablet, Take 1 tablet (25 mg total) by mouth daily as needed (Constipation)., Disp: 30 tablet, Rfl: 0   Multiple Vitamins-Minerals (MULTIVITAMIN WITH MINERALS) tablet, Take 1 tablet by mouth daily., Disp: , Rfl:    ondansetron  (ZOFRAN ) 8 MG tablet, Take 1 tablet (8 mg total) by mouth every 8 (eight) hours as needed for nausea or vomiting., Disp: 20 tablet, Rfl: 0   ondansetron  (ZOFRAN -ODT) 4 MG disintegrating tablet, Take 1 tablet (4 mg total) by mouth every 8 (eight) hours as needed for nausea or vomiting., Disp: 20 tablet, Rfl: 0   Oxycodone  HCl 10 MG TABS, Take 1 tablet (10 mg total) by mouth every 6 (six) hours as needed (severe pain)., Disp: 20 tablet, Rfl: 0   pantoprazole  (PROTONIX ) 40 MG tablet, TAKE 1 TABLET BY MOUTH EVERY DAY IN THE MORNING, Disp: 90 tablet, Rfl: 0   polyvinyl alcohol  (LIQUIFILM TEARS) 1.4 % ophthalmic solution, Place 1 drop into both eyes 3 (three) times daily., Disp: 15 mL, Rfl: 1   traZODone  (DESYREL ) 50 MG tablet, Take 50 mg by mouth at bedtime., Disp: , Rfl:   Allergies  Allergen Reactions   Acetaminophen  Other (See  Comments)    Liver issues   Atorvastatin     Myalgias    Sertraline Hives and Shortness Of Breath   Cefuroxime Hives    Has tolerated 1st generation cephalosporin (CEPHALEXIN) in the past with no documented ADRs.    Codeine Hives   Tape     Adhesive tape    Amoxicillin Rash   Penicillins Rash    Has tolerated 1st generation cephalosporin (CEPHALEXIN) in the past with no documented ADRs.    Sulfa Antibiotics Rash    I personally reviewed active  problem list, medication list, allergies with the patient/caregiver today.   ROS  Ten systems reviewed and is negative except as mentioned in HPI    Objective Physical Exam Constitutional: Patient appears well-developed and well-nourished. Obese  No distress.  HEENT: head atraumatic, normocephalic, pupils equal and reactive to light, neck supple Cardiovascular: Normal rate, regular rhythm and normal heart sounds.  No murmur heard. No BLE edema. Pulmonary/Chest: Effort normal and breath sounds normal. No respiratory distress. Abdominal: Soft.  There is no tenderness. Skin: scars on her back in good aspect, no signs of infection Psychiatric: Patient has a normal mood and affect. behavior is normal. Judgment and thought content normal.     Vitals:   11/17/23 1047  BP: 126/74  Pulse: 94  Resp: 16  SpO2: 93%  Weight: 204 lb 1.6 oz (92.6 kg)  Height: 5' 4.25 (1.632 m)    Body mass index is 34.76 kg/m.  Recent Results (from the past 2160 hours)  CBC     Status: Abnormal   Collection Time: 10/04/23  2:23 PM  Result Value Ref Range   WBC 6.7 4.0 - 10.5 K/uL   RBC 4.91 3.87 - 5.11 MIL/uL   Hemoglobin 15.0 12.0 - 15.0 g/dL   HCT 16.1 (H) 09.6 - 04.5 %   MCV 94.3 80.0 - 100.0 fL   MCH 30.5 26.0 - 34.0 pg   MCHC 32.4 30.0 - 36.0 g/dL   RDW 40.9 81.1 - 91.4 %   Platelets 200 150 - 400 K/uL   nRBC 0.0 0.0 - 0.2 %    Comment: Performed at [facility], [address] [place], [place] [address]  Type and screen [facility]     Status: None   Collection Time: 10/04/23  2:23 PM  Result Value Ref Range   ABO/RH(D) O POS    Antibody Screen NEG    Sample Expiration 10/18/2023,2359    Extend sample reason      NO TRANSFUSIONS OR PREGNANCY IN THE PAST 3 MONTHS Performed at [facility], [address] [place], [place] [address]   Urinalysis, Routine w reflex microscopic -Urine, Clean Catch     Status: Abnormal   Collection Time: 10/04/23  2:28 PM  Result Value Ref Range   Color, Urine YELLOW (A) YELLOW   APPearance HAZY (A) CLEAR   Specific Gravity, Urine 1.018 1.005 - 1.030   pH 5.0 5.0 - 8.0   Glucose, UA NEGATIVE NEGATIVE mg/dL   Hgb urine dipstick NEGATIVE NEGATIVE   Bilirubin Urine NEGATIVE NEGATIVE   Ketones, ur NEGATIVE NEGATIVE mg/dL   Protein, ur NEGATIVE NEGATIVE mg/dL   Nitrite NEGATIVE NEGATIVE   Leukocytes,Ua NEGATIVE NEGATIVE    Comment: Performed at [facility], [address] [place], [place] [address]  Surgical pcr screen     Status: None   Collection Time: 10/04/23  2:42 PM   Specimen: Nasal Mucosa; Nasal Swab  Result Value Ref Range   MRSA, PCR NEGATIVE NEGATIVE   Staphylococcus aureus NEGATIVE NEGATIVE    Comment: (NOTE) The Xpert SA Assay (FDA approved for NASAL specimens in patients 70 years of age and older), is one component of a comprehensive surveillance program. It is not intended to diagnose infection nor to guide or monitor treatment. Performed at [facility], [address] [place], [place] [address]   ABO/Rh     Status: None   Collection Time: 10/17/23  9:45 AM  Result Value Ref Range  ABO/RH(D)      O POS Performed at [facility], [address] [place], [place] [address]   Glucose, capillary     Status: Abnormal   Collection Time: 10/17/23  5:53 PM  Result Value Ref Range   Glucose-Capillary 182 (H) 70 - 99 mg/dL    Comment: Glucose reference range applies only to samples taken after  fasting for at least 8 hours.  Blood gas, venous     Status: Abnormal   Collection Time: 10/18/23 10:28 PM  Result Value Ref Range   pH, Ven 7.43 7.25 - 7.43   pCO2, Ven 48 44 - 60 mmHg   Bicarbonate 31.9 (H) 20.0 - 28.0 mmol/L   Acid-Base Excess 6.4 (H) 0.0 - 2.0 mmol/L   Patient temperature 37.0    Collection site VEIN     Comment: Performed at [facility], [address] [place], [place] [address]  Brain natriuretic peptide     Status: Abnormal   Collection Time: 10/18/23 10:28 PM  Result Value Ref Range   B Natriuretic Peptide 274.0 (H) 0.0 - 100.0 pg/mL    Comment: Performed at [facility], [address] [place], [place] [address]  CBC     Status: Abnormal   Collection Time: 10/18/23 10:28 PM  Result Value Ref Range   WBC 16.4 (H) 4.0 - 10.5 K/uL   RBC 4.05 3.87 - 5.11 MIL/uL   Hemoglobin 12.5 12.0 - 15.0 g/dL   HCT 91.4 78.2 - 95.6 %   MCV 93.8 80.0 - 100.0 fL   MCH 30.9 26.0 - 34.0 pg   MCHC 32.9 30.0 - 36.0 g/dL   RDW 21.3 08.6 - 57.8 %   Platelets 184 150 - 400 K/uL   nRBC 0.0 0.0 - 0.2 %    Comment: Performed at [facility], [address] [place], [place] [address]  Basic metabolic panel with GFR     Status: Abnormal   Collection Time: 10/18/23 10:28 PM  Result Value Ref Range   Sodium 138 135 - 145 mmol/L   Potassium 4.4 3.5 - 5.1 mmol/L   Chloride 102 98 - 111 mmol/L   CO2 25 22 - 32 mmol/L   Glucose, Bld 167 (H) 70 - 99 mg/dL    Comment: Glucose reference range applies only to samples taken after fasting for at least 8 hours.   BUN 12 8 - 23 mg/dL   Creatinine, Ser 9.52 0.44 - 1.00 mg/dL   Calcium 8.6 (L) 8.9 - 10.3 mg/dL   GFR, Estimated >84 >13 mL/min    Comment: (NOTE) Calculated using the CKD-EPI Creatinine Equation (2021)    Anion gap 11 5 - 15    Comment: Performed at [facility], [address] [place], [place] [address]  Troponin I (High Sensitivity)     Status: None   Collection Time: 10/18/23  10:28 PM  Result Value Ref Range   Troponin I (High Sensitivity) 11 <18 ng/L    Comment: (NOTE) Elevated high sensitivity troponin I (hsTnI) values and significant  changes across serial measurements may suggest ACS but many other  chronic and acute conditions are known to elevate hsTnI results.  Refer to the Links section for chest pain algorithms and additional  guidance. Performed at [facility], [address] [place], [place] [address]   Resp panel by RT-PCR (RSV, Flu A&B, Covid) Anterior Nasal Swab     Status: None   Collection Time: 10/18/23 10:31 PM  Specimen: Anterior Nasal Swab  Result Value Ref Range   SARS Coronavirus 2 by RT PCR NEGATIVE NEGATIVE    Comment: (NOTE) SARS-CoV-2 target nucleic acids are NOT DETECTED.  The SARS-CoV-2 RNA is generally detectable in upper respiratory specimens during the acute phase of infection. The lowest concentration of SARS-CoV-2 viral copies this assay can detect is 138 copies/mL. A negative result does not preclude SARS-Cov-2 infection and should not be used as the sole basis for treatment or other patient management decisions. A negative result may occur with  improper specimen collection/handling, submission of specimen other than nasopharyngeal swab, presence of viral mutation(s) within the areas targeted by this assay, and inadequate number of viral copies(<138 copies/mL). A negative result must be combined with clinical observations, patient history, and epidemiological information. The expected result is Negative.  Fact Sheet for Patients:  BloggerCourse.com  Fact Sheet for Healthcare Providers:  SeriousBroker.it  This test is no t yet approved or cleared by the [place]  FDA and  has been authorized for detection and/or diagnosis of SARS-CoV-2 by FDA under an Emergency Use Authorization (EUA). This EUA will remain  in effect (meaning this test can be  used) for the duration of the COVID-19 declaration under Section 564(b)(1) of the Act, 21 U.S.C.section 360bbb-3(b)(1), unless the authorization is terminated  or revoked sooner.       Influenza A by PCR NEGATIVE NEGATIVE   Influenza B by PCR NEGATIVE NEGATIVE    Comment: (NOTE) The Xpert Xpress SARS-CoV-2/FLU/RSV plus assay is intended as an aid in the diagnosis of influenza from Nasopharyngeal swab specimens and should not be used as a sole basis for treatment. Nasal washings and aspirates are unacceptable for Xpert Xpress SARS-CoV-2/FLU/RSV testing.  Fact Sheet for Patients: BloggerCourse.com  Fact Sheet for Healthcare Providers: SeriousBroker.it  This test is not yet approved or cleared by the [place]  FDA and has been authorized for detection and/or diagnosis of SARS-CoV-2 by FDA under an Emergency Use Authorization (EUA). This EUA will remain in effect (meaning this test can be used) for the duration of the COVID-19 declaration under Section 564(b)(1) of the Act, 21 U.S.C. section 360bbb-3(b)(1), unless the authorization is terminated or revoked.     Resp Syncytial Virus by PCR NEGATIVE NEGATIVE    Comment: (NOTE) Fact Sheet for Patients: BloggerCourse.com  Fact Sheet for Healthcare Providers: SeriousBroker.it  This test is not yet approved or cleared by the [place]  FDA and has been authorized for detection and/or diagnosis of SARS-CoV-2 by FDA under an Emergency Use Authorization (EUA). This EUA will remain in effect (meaning this test can be used) for the duration of the COVID-19 declaration under Section 564(b)(1) of the Act, 21 U.S.C. section 360bbb-3(b)(1), unless the authorization is terminated or revoked.  Performed at [facility], [address] [place], [place] [address]   CBC with Differential/Platelet     Status: Abnormal    Collection Time: 10/19/23  9:39 AM  Result Value Ref Range   WBC 20.8 (H) 4.0 - 10.5 K/uL   RBC 4.31 3.87 - 5.11 MIL/uL   Hemoglobin 13.3 12.0 - 15.0 g/dL   HCT 29.5 62.1 - 30.8 %   MCV 90.3 80.0 - 100.0 fL   MCH 30.9 26.0 - 34.0 pg   MCHC 34.2 30.0 - 36.0 g/dL   RDW 65.7 84.6 - 96.2 %   Platelets 226 150 - 400 K/uL   nRBC 0.0 0.0 - 0.2 %   Neutrophils Relative % 86 %  Neutro Abs 17.9 (H) 1.7 - 7.7 K/uL   Lymphocytes Relative 7 %   Lymphs Abs 1.5 0.7 - 4.0 K/uL   Monocytes Relative 5 %   Monocytes Absolute 1.1 (H) 0.1 - 1.0 K/uL   Eosinophils Relative 0 %   Eosinophils Absolute 0.0 0.0 - 0.5 K/uL   Basophils Relative 0 %   Basophils Absolute 0.0 0.0 - 0.1 K/uL   Immature Granulocytes 2 %   Abs Immature Granulocytes 0.35 (H) 0.00 - 0.07 K/uL    Comment: Performed at [facility], [address], [place], [place] [address]  Basic metabolic panel     Status: Abnormal   Collection Time: 10/19/23  9:39 AM  Result Value Ref Range   Sodium 133 (L) 135 - 145 mmol/L   Potassium 3.7 3.5 - 5.1 mmol/L   Chloride 99 98 - 111 mmol/L   CO2 22 22 - 32 mmol/L   Glucose, Bld 211 (H) 70 - 99 mg/dL    Comment: Glucose reference range applies only to samples taken after fasting for at least 8 hours.   BUN 14 8 - 23 mg/dL   Creatinine, Ser 5.64 0.44 - 1.00 mg/dL   Calcium 8.5 (L) 8.9 - 10.3 mg/dL   GFR, Estimated >33 >29 mL/min    Comment: (NOTE) Calculated using the CKD-EPI Creatinine Equation (2021)    Anion gap 12 5 - 15    Comment: Performed at [facility], [address] [place], [place] [address]  CBC with Differential/Platelet     Status: Abnormal   Collection Time: 10/20/23  8:32 AM  Result Value Ref Range   WBC 21.5 (H) 4.0 - 10.5 K/uL   RBC 4.45 3.87 - 5.11 MIL/uL   Hemoglobin 13.7 12.0 - 15.0 g/dL   HCT 16.6 06.3 - 01.6 %   MCV 91.7 80.0 - 100.0 fL   MCH 30.8 26.0 - 34.0 pg   MCHC 33.6 30.0 - 36.0 g/dL   RDW 01.0 93.2 - 35.5 %   Platelets 235 150 -  400 K/uL   nRBC 0.0 0.0 - 0.2 %   Neutrophils Relative % 85 %   Neutro Abs 18.2 (H) 1.7 - 7.7 K/uL   Lymphocytes Relative 9 %   Lymphs Abs 1.9 0.7 - 4.0 K/uL   Monocytes Relative 5 %   Monocytes Absolute 1.2 (H) 0.1 - 1.0 K/uL   Eosinophils Relative 0 %   Eosinophils Absolute 0.0 0.0 - 0.5 K/uL   Basophils Relative 0 %   Basophils Absolute 0.0 0.0 - 0.1 K/uL   Immature Granulocytes 1 %   Abs Immature Granulocytes 0.24 (H) 0.00 - 0.07 K/uL    Comment: Performed at [facility], [address], [place], [place] [address]  Basic metabolic panel     Status: Abnormal   Collection Time: 10/20/23  8:32 AM  Result Value Ref Range   Sodium 137 135 - 145 mmol/L   Potassium 4.3 3.5 - 5.1 mmol/L   Chloride 101 98 - 111 mmol/L   CO2 27 22 - 32 mmol/L   Glucose, Bld 176 (H) 70 - 99 mg/dL    Comment: Glucose reference range applies only to samples taken after fasting for at least 8 hours.   BUN 19 8 - 23 mg/dL   Creatinine, Ser 2.54 0.44 - 1.00 mg/dL   Calcium 8.9 8.9 - 27.0 mg/dL   GFR, Estimated >62 >37 mL/min    Comment: (NOTE) Calculated using the CKD-EPI Creatinine Equation (  2021)    Anion gap 9 5 - 15    Comment: Performed at [facility], [address] [place], [place] [address]  Glucose, capillary     Status: Abnormal   Collection Time: 10/20/23 12:07 PM  Result Value Ref Range   Glucose-Capillary 140 (H) 70 - 99 mg/dL    Comment: Glucose reference range applies only to samples taken after fasting for at least 8 hours.  Glucose, capillary     Status: Abnormal   Collection Time: 10/20/23  4:42 PM  Result Value Ref Range   Glucose-Capillary 148 (H) 70 - 99 mg/dL    Comment: Glucose reference range applies only to samples taken after fasting for at least 8 hours.  CBC with Differential/Platelet     Status: Abnormal   Collection Time: 10/21/23  2:20 AM  Result Value Ref Range   WBC 12.8 (H) 4.0 - 10.5 K/uL   RBC 4.08 3.87 - 5.11 MIL/uL   Hemoglobin 12.5 12.0  - 15.0 g/dL   HCT 60.4 54.0 - 98.1 %   MCV 93.4 80.0 - 100.0 fL   MCH 30.6 26.0 - 34.0 pg   MCHC 32.8 30.0 - 36.0 g/dL   RDW 19.1 47.8 - 29.5 %   Platelets 197 150 - 400 K/uL   nRBC 0.0 0.0 - 0.2 %   Neutrophils Relative % 71 %   Neutro Abs 9.1 (H) 1.7 - 7.7 K/uL   Lymphocytes Relative 20 %   Lymphs Abs 2.6 0.7 - 4.0 K/uL   Monocytes Relative 8 %   Monocytes Absolute 1.0 0.1 - 1.0 K/uL   Eosinophils Relative 0 %   Eosinophils Absolute 0.0 0.0 - 0.5 K/uL   Basophils Relative 0 %   Basophils Absolute 0.0 0.0 - 0.1 K/uL   Immature Granulocytes 1 %   Abs Immature Granulocytes 0.09 (H) 0.00 - 0.07 K/uL    Comment: Performed at [facility], [address] [place], [place] [address]  Basic metabolic panel with GFR     Status: Abnormal   Collection Time: 10/21/23  2:20 AM  Result Value Ref Range   Sodium 139 135 - 145 mmol/L   Potassium 3.9 3.5 - 5.1 mmol/L   Chloride 105 98 - 111 mmol/L   CO2 26 22 - 32 mmol/L   Glucose, Bld 140 (H) 70 - 99 mg/dL    Comment: Glucose reference range applies only to samples taken after fasting for at least 8 hours.   BUN 27 (H) 8 - 23 mg/dL   Creatinine, Ser 8.65 0.44 - 1.00 mg/dL   Calcium 8.8 (L) 8.9 - 10.3 mg/dL   GFR, Estimated >78 >46 mL/min    Comment: (NOTE) Calculated using the CKD-EPI Creatinine Equation (2021)    Anion gap 8 5 - 15    Comment: Performed at [facility], [address] [place], [place] [address]  Glucose, capillary     Status: Abnormal   Collection Time: 10/21/23  7:52 AM  Result Value Ref Range   Glucose-Capillary 132 (H) 70 - 99 mg/dL    Comment: Glucose reference range applies only to samples taken after fasting for at least 8 hours.  Glucose, capillary     Status: Abnormal   Collection Time: 10/21/23 11:56 AM  Result Value Ref Range   Glucose-Capillary 141 (H) 70 - 99 mg/dL    Comment: Glucose reference range applies only to samples taken after fasting for at least 8 hours.  Glucose,  capillary  Status: Abnormal   Collection Time: 10/21/23  5:08 PM  Result Value Ref Range   Glucose-Capillary 139 (H) 70 - 99 mg/dL    Comment: Glucose reference range applies only to samples taken after fasting for at least 8 hours.  Glucose, capillary     Status: Abnormal   Collection Time: 10/21/23  9:04 PM  Result Value Ref Range   Glucose-Capillary 122 (H) 70 - 99 mg/dL    Comment: Glucose reference range applies only to samples taken after fasting for at least 8 hours.   Comment 1 Notify RN   Glucose, capillary     Status: Abnormal   Collection Time: 10/22/23  7:47 AM  Result Value Ref Range   Glucose-Capillary 128 (H) 70 - 99 mg/dL    Comment: Glucose reference range applies only to samples taken after fasting for at least 8 hours.  CBC with Differential/Platelet     Status: Abnormal   Collection Time: 10/22/23  9:51 AM  Result Value Ref Range   WBC 9.3 4.0 - 10.5 K/uL   RBC 4.24 3.87 - 5.11 MIL/uL   Hemoglobin 13.1 12.0 - 15.0 g/dL   HCT 16.1 09.6 - 04.5 %   MCV 92.0 80.0 - 100.0 fL   MCH 30.9 26.0 - 34.0 pg   MCHC 33.6 30.0 - 36.0 g/dL   RDW 40.9 81.1 - 91.4 %   Platelets 214 150 - 400 K/uL   nRBC 0.0 0.0 - 0.2 %   Neutrophils Relative % 71 %   Neutro Abs 6.6 1.7 - 7.7 K/uL   Lymphocytes Relative 20 %   Lymphs Abs 1.8 0.7 - 4.0 K/uL   Monocytes Relative 7 %   Monocytes Absolute 0.7 0.1 - 1.0 K/uL   Eosinophils Relative 1 %   Eosinophils Absolute 0.1 0.0 - 0.5 K/uL   Basophils Relative 0 %   Basophils Absolute 0.0 0.0 - 0.1 K/uL   Immature Granulocytes 1 %   Abs Immature Granulocytes 0.08 (H) 0.00 - 0.07 K/uL    Comment: Performed at [facility], [address] [place], [place] [address]  Basic metabolic panel     Status: Abnormal   Collection Time: 10/22/23  9:51 AM  Result Value Ref Range   Sodium 135 135 - 145 mmol/L   Potassium 4.0 3.5 - 5.1 mmol/L   Chloride 100 98 - 111 mmol/L   CO2 24 22 - 32 mmol/L   Glucose, Bld 167 (H) 70 - 99 mg/dL     Comment: Glucose reference range applies only to samples taken after fasting for at least 8 hours.   BUN 20 8 - 23 mg/dL   Creatinine, Ser 6.21 0.44 - 1.00 mg/dL   Calcium 8.6 (L) 8.9 - 10.3 mg/dL   GFR, Estimated >30 >86 mL/min    Comment: (NOTE) Calculated using the CKD-EPI Creatinine Equation (2021)    Anion gap 11 5 - 15    Comment: Performed at [facility], [address] [place], [place] [address]  Glucose, capillary     Status: Abnormal   Collection Time: 10/22/23 11:38 AM  Result Value Ref Range   Glucose-Capillary 143 (H) 70 - 99 mg/dL    Comment: Glucose reference range applies only to samples taken after fasting for at least 8 hours.  Lipase, blood     Status: None   Collection Time: 11/06/23  4:34 AM  Result Value Ref Range   Lipase 25 11 - 51 U/L    Comment:  Performed at [facility], [address] [place], [place] [address]  Comprehensive metabolic panel     Status: Abnormal   Collection Time: 11/06/23  4:34 AM  Result Value Ref Range   Sodium 137 135 - 145 mmol/L   Potassium 4.5 3.5 - 5.1 mmol/L   Chloride 100 98 - 111 mmol/L   CO2 25 22 - 32 mmol/L   Glucose, Bld 155 (H) 70 - 99 mg/dL    Comment: Glucose reference range applies only to samples taken after fasting for at least 8 hours.   BUN 16 8 - 23 mg/dL   Creatinine, Ser 8.29 (H) 0.44 - 1.00 mg/dL   Calcium 9.7 8.9 - 56.2 mg/dL   Total Protein 8.2 (H) 6.5 - 8.1 g/dL   Albumin 3.6 3.5 - 5.0 g/dL   AST 62 (H) 15 - 41 U/L   ALT 62 (H) 0 - 44 U/L   Alkaline Phosphatase 206 (H) 38 - 126 U/L   Total Bilirubin 0.7 0.0 - 1.2 mg/dL   GFR, Estimated 58 (L) >60 mL/min    Comment: (NOTE) Calculated using the CKD-EPI Creatinine Equation (2021)    Anion gap 12 5 - 15    Comment: Performed at [facility], [address] [place], [place] [address]  CBC     Status: None   Collection Time: 11/06/23  4:34 AM  Result Value Ref Range   WBC 9.3 4.0 - 10.5 K/uL   RBC 4.59 3.87 - 5.11  MIL/uL   Hemoglobin 14.0 12.0 - 15.0 g/dL   HCT 57.8 46.9 - 62.9 %   MCV 95.0 80.0 - 100.0 fL   MCH 30.5 26.0 - 34.0 pg   MCHC 32.1 30.0 - 36.0 g/dL   RDW 52.8 41.3 - 24.4 %   Platelets 336 150 - 400 K/uL   nRBC 0.0 0.0 - 0.2 %    Comment: Performed at [facility], [address] [place], [place] [address]  Urinalysis, Routine w reflex microscopic -Urine, Clean Catch     Status: Abnormal   Collection Time: 11/06/23  6:02 AM  Result Value Ref Range   Color, Urine AMBER (A) YELLOW    Comment: BIOCHEMICALS MAY BE AFFECTED BY COLOR   APPearance HAZY (A) CLEAR   Specific Gravity, Urine 1.019 1.005 - 1.030   pH 5.0 5.0 - 8.0   Glucose, UA NEGATIVE NEGATIVE mg/dL   Hgb urine dipstick LARGE (A) NEGATIVE   Bilirubin Urine NEGATIVE NEGATIVE   Ketones, ur NEGATIVE NEGATIVE mg/dL   Protein, ur 253 (A) NEGATIVE mg/dL   Nitrite NEGATIVE NEGATIVE   Leukocytes,Ua MODERATE (A) NEGATIVE   RBC / HPF >50 0 - 5 RBC/hpf   WBC, UA >50 0 - 5 WBC/hpf   Bacteria, UA NONE SEEN NONE SEEN   Squamous Epithelial / HPF 0-5 0 - 5 /HPF   Mucus PRESENT    Ca Oxalate Crys, UA PRESENT     Comment: Performed at [facility], [address], [place], [place] [address]  Urine Culture     Status: None   Collection Time: 11/06/23  6:02 AM   Specimen: Urine, Catheterized  Result Value Ref Range   Specimen Description      URINE, CATHETERIZED Performed at [facility], [address], [place], [place] [address]    Special Requests      NONE Performed at [facility], [address], [place], [place] [address]    Culture  NO GROWTH Performed at [facility], [address] [place], [place] [address]    Report Status 11/07/2023 FINAL   BLADDER SCAN AMB NON-IMAGING     Status: None   Collection Time: 11/10/23  4:11 PM  Result Value Ref Range   Scan Result 1 ml    PHQ2/9:    11/17/2023   10:46 AM 09/27/2023    1:18 PM 08/25/2023    1:57 PM 06/29/2023     3:40 PM 04/18/2023    3:41 PM  Depression screen PHQ 2/9  Decreased Interest 3 0 0 0 0  Down, Depressed, Hopeless 3 0 1 0 0  PHQ - 2 Score 6 0 1 0 0  Altered sleeping 3  0 0 0  Tired, decreased energy 3  0 0 0  Change in appetite 3  0 0 0  Feeling bad or failure about yourself  3  0 0 0  Trouble concentrating 3  0 0 0  Moving slowly or fidgety/restless 0  0 0 0  Suicidal thoughts 0  0 0 0  PHQ-9 Score 21  1 0 0  Difficult doing work/chores Very difficult  Not difficult at all Not difficult at all Not difficult at all    phq 9 is positive  Fall Risk:    11/17/2023   10:46 AM 10/28/2023    2:10 PM 09/27/2023    1:18 PM 08/25/2023    1:59 PM 07/08/2023   10:41 AM  Fall Risk   Falls in the past year? 0 0 0 0 0  Number falls in past yr: 0 0 0 0 0  Injury with Fall? 0 0 0 0 0  Risk for fall due to : No Fall Risks Impaired balance/gait No Fall Risks No Fall Risks No Fall Risks  Follow up Education provided;Falls evaluation completed;Falls prevention discussed  Falls evaluation completed Falls prevention discussed;Falls evaluation completed Falls evaluation completed      Assessment & Plan Postoperative complications of spinal surgery Recent lumbar interbody fusion with moderate spinal fluid leak. Prolonged recovery due to complications and bed rest. - Encourage gradual increase in physical activity. - Advise standing every 20 minutes to adjust blood pressure and muscle tone. - Recommend walking with assistance for short durations. - Continue physical therapy as tolerated.  Numbness and nerve damage in legs Numbness and nerve damage in legs and feet, particularly the right leg, likely related to recent spinal surgery. - Continue physical therapy and discuss with neurosurgeon  Orthostatic hypotension Experiencing dizziness and lightheadedness upon standing. Blood pressure fluctuating due to prolonged bed rest and recovery from surgery. - Monitor blood pressure regularly. - Hold  losartan  if systolic blood pressure is below 100 mmHg. - Take half a pill of losartan  if systolic blood pressure is between 100-120 mmHg. - Encourage hydration and gradual increase in physical activity.  Pyelonephritis Recent pyelonephritis treated with Levaquin . Symptoms improving. - Complete course of Levaquin .  Major depressive disorder, recurrent, severe Major depressive disorder with high depression score due to recent stress and complications. Awaiting follow-up with psychiatrist for further management. - Prescribe 30-day supply of duloxetine  90 mg daily with one refill. - Prescribe 30-day supply of Seroquel  100 mg BID - Prescribe 30-day supply of trazodone  50 mg qpm - Follow up with psychiatrist, Dr. Kapoor, for further management.

## 2023-11-17 NOTE — Telephone Encounter (Signed)
 Requested Prescriptions  Pending Prescriptions Disp Refills   pantoprazole  (PROTONIX ) 40 MG tablet [Pharmacy Med Name: PANTOPRAZOLE  SOD DR 40 MG TAB] 90 tablet 0    Sig: TAKE 1 TABLET BY MOUTH EVERY DAY IN THE MORNING     Gastroenterology: Proton Pump Inhibitors Passed - 11/17/2023 10:42 AM      Passed - Valid encounter within last 12 months    Recent Outpatient Visits           Today    [facility] Arleen Lacer, MD   2 weeks ago Hospital discharge follow-up   [facility] Rockney Cid, DO   1 month ago Pre-op exam   [facility] Rockney Cid, DO   4 months ago Dysuria   [facility] Rockney Cid, DO       Future Appointments             In 1 week Rockney Cid, DO [facility]   In 3 weeks Kathreen Pare, PA-C [facility]   In 2 months Elta Halter, MD [facility]

## 2023-11-17 NOTE — Patient Instructions (Signed)
 Hold bp medication if below 100 systolic If between 100-120 take half pill

## 2023-11-23 ENCOUNTER — Other Ambulatory Visit: Payer: Self-pay

## 2023-11-23 DIAGNOSIS — M4316 Spondylolisthesis, lumbar region: Secondary | ICD-10-CM

## 2023-11-24 ENCOUNTER — Encounter: Payer: Self-pay | Admitting: Urology

## 2023-11-24 ENCOUNTER — Ambulatory Visit: Admitting: Neurosurgery

## 2023-11-24 ENCOUNTER — Encounter: Payer: Self-pay | Admitting: Neurosurgery

## 2023-11-24 ENCOUNTER — Ambulatory Visit
Admission: RE | Admit: 2023-11-24 | Discharge: 2023-11-24 | Disposition: A | Discharge status: Home / Self Care | Source: Ambulatory Visit | Attending: Neurosurgery | Admitting: Neurosurgery

## 2023-11-24 ENCOUNTER — Ambulatory Visit
Admission: RE | Admit: 2023-11-24 | Discharge: 2023-11-24 | Disposition: A | Discharge status: Home / Self Care | Attending: Neurosurgery | Admitting: Neurosurgery

## 2023-11-24 VITALS — BP 130/84 | Temp 98.4°F | Ht 64.25 in | Wt 204.0 lb

## 2023-11-24 DIAGNOSIS — M4316 Spondylolisthesis, lumbar region: Secondary | ICD-10-CM

## 2023-11-24 DIAGNOSIS — G629 Polyneuropathy, unspecified: Secondary | ICD-10-CM

## 2023-11-24 DIAGNOSIS — Z981 Arthrodesis status: Secondary | ICD-10-CM

## 2023-11-24 NOTE — Progress Notes (Signed)
   REFERRING PHYSICIAN:  Bernardo Sharyle Has [address] [place],  [place] [address]  DOS: 10/17/23 MIS TLIF L4-L5 with PSF  HISTORY OF PRESENT ILLNESS: Victoria [name] is status post above surgery.  She is doing well.  Her symptoms have been improving.   PHYSICAL EXAMINATION:  General: Patient is well developed, well nourished, calm, collected, and in no apparent distress.   NEUROLOGICAL:  General: In no acute distress.   Awake, alert, oriented to person, place, and time.  Pupils equal round and reactive to light.  Facial tone is symmetric.     Strength:          Side Iliopsoas Quads Hamstring PF DF EHL  R 5 5 5 5 5 5   L 5 5 5 5 5 5    Incisions c/d/I  ROS (Neurologic):  Negative except as noted above  IMAGING: No complications noted  ASSESSMENT/PLAN:  JARETSSI KRAKER is doing better s/p above surgery.   We reviewed her activity limitations.  She will start increasing her activity.  She is not yet cleared to ride her horses.  We reviewed that I will allow her to start riding horses at a walk after her next appointment.  She has been referred to Dr. Marcelino to wean off of opiate therapy.  I will send him a message.  She has diagnosed peripheral neuropathy, so may be a candidate for spinal cord stimulator for help with management of this as well.  She has requested a referral to neurology for workup of her neuropathy.  Reeves Daisy MD Department of neurosurgery

## 2023-11-29 ENCOUNTER — Encounter: Payer: Self-pay | Admitting: Internal Medicine

## 2023-11-29 ENCOUNTER — Ambulatory Visit (INDEPENDENT_AMBULATORY_CARE_PROVIDER_SITE_OTHER): Admitting: Internal Medicine

## 2023-11-29 ENCOUNTER — Other Ambulatory Visit: Payer: Self-pay

## 2023-11-29 VITALS — BP 118/82 | HR 85 | Temp 98.1°F | Resp 16 | Ht 65.0 in | Wt 203.5 lb

## 2023-11-29 DIAGNOSIS — R0602 Shortness of breath: Secondary | ICD-10-CM | POA: Diagnosis not present

## 2023-11-29 DIAGNOSIS — R918 Other nonspecific abnormal finding of lung field: Secondary | ICD-10-CM | POA: Diagnosis not present

## 2023-11-29 DIAGNOSIS — K5903 Drug induced constipation: Secondary | ICD-10-CM | POA: Diagnosis not present

## 2023-11-29 DIAGNOSIS — R1310 Dysphagia, unspecified: Secondary | ICD-10-CM | POA: Diagnosis not present

## 2023-11-29 DIAGNOSIS — R5383 Other fatigue: Secondary | ICD-10-CM

## 2023-11-29 DIAGNOSIS — R7303 Prediabetes: Secondary | ICD-10-CM | POA: Diagnosis not present

## 2023-11-29 DIAGNOSIS — J4531 Mild persistent asthma with (acute) exacerbation: Secondary | ICD-10-CM

## 2023-11-29 MED ORDER — MOVANTIK 25 MG PO TABS
25.0000 mg | ORAL_TABLET | Freq: Every day | ORAL | 1 refills | Status: DC | PRN
Start: 2023-11-29 — End: 2024-04-24

## 2023-11-29 NOTE — Progress Notes (Signed)
 Established Patient Office Visit  Subjective    Patient ID: Victoria [name], female    DOB: [birth year]  Age: 63 y.o. MRN: [id number]  CC:  Chief Complaint  Patient presents with   Follow-up    4 week recheck    HPI Victoria [name] presents for follow up on chronic medical conditions. She is here with her husband.   Discussed the use of AI scribe software for clinical note transcription with the patient, who gave verbal consent to proceed.  History of Present Illness  Victoria [name] is a 63 year old female who presents with urinary issues and difficulty swallowing.  She has ongoing urinary issues following a recent urological procedure. A catheter was placed post-surgery, leading to a urinary tract infection. Pain prompted an emergency room visit where the catheter was replaced. After completing antibiotics, there are no current urinary symptoms such as burning or hematuria.  She has experienced worsening difficulty swallowing since 2017. Episodes occur where she cannot swallow, especially with a dry mouth, and sometimes loses the swallowing reflex entirely. This is accompanied by dizziness, which occurs upon standing and walking, making her feel heavy and as if she might pass out.  There is a family history of autoimmune diseases. She has experienced symptoms such as skin rashes, tremors in her hands and legs, eye fluttering, and sinus drainage causing throat discomfort.   Outpatient Encounter Medications as of 11/29/2023  Medication Sig   albuterol  (VENTOLIN  HFA) 108 (90 Base) MCG/ACT inhaler Inhale 1-2 puffs into the lungs every 6 (six) hours as needed for wheezing or shortness of breath.   B Complex Vitamins (B-COMPLEX/B-12 PO) Take by mouth.   baclofen  (LIORESAL ) 10 MG tablet Take 10 mg by mouth 3 (three) times daily.   benzonatate  (TESSALON  PERLES) 100 MG capsule Take 1 capsule (100 mg total) by mouth every 6 (six) hours as needed for cough.   cholecalciferol  (VITAMIN  D) 25 MCG tablet Take 1 tablet (1,000 Units total) by mouth daily.   conjugated estrogens (PREMARIN) vaginal cream Place 1 applicator vaginally once a week.   DULoxetine  (CYMBALTA ) 30 MG capsule Take 3 capsules (90 mg total) by mouth daily.   fluticasone  furoate-vilanterol (BREO ELLIPTA ) 200-25 MCG/ACT AEPB Inhale 1 puff into the lungs daily.   gabapentin  (NEURONTIN ) 300 MG capsule Take 3 capsules (900 mg total) by mouth 3 (three) times daily.   ipratropium-albuterol  (DUONEB) 0.5-2.5 (3) MG/3ML SOLN Take 3 mLs by nebulization 3 (three) times daily as needed (cough wheeze SOB bronchitis/asthma symptoms).   ketoconazole  (NIZORAL ) 2 % cream Apply 1 Application topically daily.   losartan  (COZAAR ) 25 MG tablet Take 1 tablet (25 mg total) by mouth daily.   miconazole (MONISTAT 1 COMBINATION PACK) kit Place 1 each vaginally once.   MOVANTIK  25 MG TABS tablet Take 1 tablet (25 mg total) by mouth daily as needed (Constipation).   Multiple Vitamins-Minerals (MULTIVITAMIN WITH MINERALS) tablet Take 1 tablet by mouth daily.   ondansetron  (ZOFRAN ) 8 MG tablet Take 1 tablet (8 mg total) by mouth every 8 (eight) hours as needed for nausea or vomiting.   ondansetron  (ZOFRAN -ODT) 4 MG disintegrating tablet Take 1 tablet (4 mg total) by mouth every 8 (eight) hours as needed for nausea or vomiting.   Oxycodone  HCl 10 MG TABS Take 1 tablet (10 mg total) by mouth every 6 (six) hours as needed (severe pain).   pantoprazole  (PROTONIX ) 40 MG tablet TAKE 1 TABLET BY MOUTH EVERY DAY IN THE MORNING  polyvinyl alcohol  (LIQUIFILM TEARS) 1.4 % ophthalmic solution Place 1 drop into both eyes 3 (three) times daily.   QUEtiapine  (SEROQUEL ) 100 MG tablet Take 1 tablet (100 mg total) by mouth 2 (two) times daily.   traZODone  (DESYREL ) 50 MG tablet Take 1 tablet (50 mg total) by mouth at bedtime.   No facility-administered encounter medications on file as of 11/29/2023.    Past Medical History:  Diagnosis Date   Asthma     Cervical spinal stenosis    Complication of anesthesia    Dyspnea    Dysrhythmia    Fatty liver    GERD (gastroesophageal reflux disease)    Hypercholesterolemia    Hypertension    Lumbar stenosis    Lyme disease    Melanocarcinoma (HCC)    Osteoarthritis of both hips    Pre-diabetes    Psychotic disorder (HCC)    Pulmonary fibrosis (HCC)    Sleep apnea     Past Surgical History:  Procedure Laterality Date   ABDOMINAL HYSTERECTOMY     APPENDECTOMY  2006   APPLICATION OF INTRAOPERATIVE CT SCAN N/A 10/17/2023   Procedure: APPLICATION OF INTRAOPERATIVE CT SCAN;  Surgeon: Clois Fret, MD;  Location: [facility] ORS;  Service: Neurosurgery;  Laterality: N/A;   BREAST EXCISIONAL BIOPSY Right 1997   neg   ESOPHAGOGASTRODUODENOSCOPY (EGD) WITH PROPOFOL  N/A 09/20/2022   Procedure: ESOPHAGOGASTRODUODENOSCOPY (EGD) WITH PROPOFOL ;  Surgeon: Therisa Bi, MD;  Location: [facility] ENDOSCOPY;  Service: Gastroenterology;  Laterality: N/A;   excision of melanoma on Right arm     HIATAL HERNIA REPAIR     Jan 2017   LAPAROSCOPY  2020   TRANSFORAMINAL LUMBAR INTERBODY FUSION W/ MIS 1 LEVEL N/A 10/17/2023   Procedure: L4-5 MINIMALLY INVASIVE (MIS) TRANSFORAMINAL LUMBAR INTERBODY FUSION (TLIF);  Surgeon: Clois Fret, MD;  Location: [facility] ORS;  Service: Neurosurgery;  Laterality: N/A;  L4-5 MINIMALLY INVASIVE (MIS) TRANSFORAMINAL LUMBAR INTERBODY FUSION (TLIF)    Family History  Problem Relation Age of Onset   Alcohol  abuse Mother    Arthritis Mother    Heart disease Mother    Liver disease Mother    Osteoporosis Mother    Clotting disorder Mother    Diabetes Father    Arthritis Father    Stroke Father    Sarcoidosis Father    Heart failure Father    Bipolar disorder Sister    Schizophrenia Sister    Depression Sister    Anxiety disorder Sister    Alcohol  abuse Sister    Diabetes Sister    Arthritis Sister    Thyroid  disease Sister    Lupus Sister    Depression Sister    Anxiety  disorder Sister    Alcohol  abuse Sister    Depression Sister    Anxiety disorder Sister    Ehlers-Danlos syndrome Daughter     Social History   Socioeconomic History   Marital status: Married    Spouse name: bruce   Number of children: 1   Years of education: Not on file   Highest education level: Associate degree: academic program  Occupational History   Occupation: home maker  Tobacco Use   Smoking status: Former    Current packs/day: 0.00    Types: Cigarettes    Quit date: 1987    Years since quitting: 38.5   Smokeless tobacco: Never  Vaping Use   Vaping status: Never Used  Substance and Sexual Activity   Alcohol  use: No    Alcohol /week: 0.0 standard drinks of  alcohol    Drug use: No    Comment: No use of illict drugs.   Sexual activity: Not Currently  Other Topics Concern   Not on file  Social History Narrative   Lives with husband at home.   Social Drivers of Corporate investment banker Strain: Low Risk  (08/25/2023)   Overall Financial Resource Strain (CARDIA)    Difficulty of Paying Living Expenses: Not hard at all  Food Insecurity: Patient Declined (10/19/2023)   Hunger Vital Sign    Worried About Running Out of Food in the Last Year: Patient declined    Ran Out of Food in the Last Year: Patient declined  Transportation Needs: No Transportation Needs (10/19/2023)   PRAPARE - Administrator, Civil Service (Medical): No    Lack of Transportation (Non-Medical): No  Physical Activity: Insufficiently Active (08/25/2023)   Exercise Vital Sign    Days of Exercise per Week: 7 days    Minutes of Exercise per Session: 20 min  Stress: No Stress Concern Present (08/25/2023)   [employer] of Occupational Health - Occupational Stress Questionnaire    Feeling of Stress : Only a little  Social Connections: Moderately Isolated (08/25/2023)   Social Connection and Isolation Panel    Frequency of Communication with Friends and Family: Once a week     Frequency of Social Gatherings with Friends and Family: Never    Attends Religious Services: 1 to 4 times per year    Active Member of [employer] or Organizations: No    Attends Banker Meetings: Never    Marital Status: Married  Catering manager Violence: Not At Risk (10/19/2023)   Humiliation, Afraid, Rape, and Kick questionnaire    Fear of Current or Ex-Partner: No    Emotionally Abused: No    Physically Abused: No    Sexually Abused: No    Review of Systems  Unable to perform ROS: Other  Gastrointestinal:  Positive for constipation.  Genitourinary:  Negative for dysuria, frequency, hematuria and urgency.  Neurological:  Positive for dizziness, tremors and weakness.      Objective    BP 118/82 (Cuff Size: Large)   Pulse 85   Temp 98.1 F (36.7 C) (Oral)   Resp 16   Ht 5' 5 (1.651 m)   Wt 203 lb 8 oz (92.3 kg)   SpO2 95%   BMI 33.86 kg/m   Physical Exam Constitutional:      Appearance: Normal appearance.  HENT:     Head: Normocephalic and atraumatic.     Mouth/Throat:     Mouth: Mucous membranes are moist.     Pharynx: Posterior oropharyngeal erythema present.   Eyes:     Extraocular Movements: Extraocular movements intact.     Conjunctiva/sclera: Conjunctivae normal.     Pupils: Pupils are equal, round, and reactive to light.    Cardiovascular:     Rate and Rhythm: Normal rate and regular rhythm.  Pulmonary:     Effort: Pulmonary effort is normal.     Breath sounds: Normal breath sounds. No wheezing, rhonchi or rales.   Skin:    General: Skin is warm and dry.   Neurological:     General: No focal deficit present.     Mental Status: She is alert. Mental status is at baseline.   Psychiatric:        Mood and Affect: Mood normal.        Behavior: Behavior normal.  Assessment & Plan:   Assessment & Plan  Swallowing Difficulties Worsening swallowing difficulties with xerostomia and loss of swallowing reflex. Possible neuromuscular or  autoimmune etiology. - Order swallow study. - Repeat autoimmune and inflammatory marker labs. - Referral to neurology already placed, waiting to hear back.   Dizziness Frequent dizziness with potential neuromuscular or autoimmune cause.  Chronic Constipation Chronic constipation exacerbated by opioid use. Current regimen includes Movantik , Dulcolax, and Miralax . - Refill Movantik . - Continue Movantik , Dulcolax, and Miralax .  Pre-Diabetes A1c of 6.6% suggests diabetes risk. Reassessment needed. - Order A1c and fasting glucose tests.  Asthma/SOB Asthma with ongoing breathing difficulties. Considering switch from Breo to Trelegy. - Provide Trelegy sample. - Order chest x-ray to reassess pleural effusions and opacities seen on 5/20.  General Health Maintenance Engaged in weight management.  Follow-up Requires follow-up on referrals and tests. - Follow up with neurology. - Follow up with pulmonology if needed. - Schedule follow-up appointment after tests and referrals.  - DG Chest 2 View; Future - DG ESOPHAGUS W DOUBLE CM (HD); Future - HgB A1c - Comprehensive Metabolic Panel (CMET) - Antinuclear Antib (ANA) - C-reactive protein - Sed Rate (ESR)  - MOVANTIK  25 MG TABS tablet; Take 1 tablet (25 mg total) by mouth daily as needed (Constipation).  Dispense: 90 tablet; Refill: 1   Return in about 3 months (around 02/29/2024).   Sharyle Fischer, DO

## 2023-12-01 ENCOUNTER — Encounter: Payer: Self-pay | Admitting: Neurology

## 2023-12-01 LAB — COMPREHENSIVE METABOLIC PANEL WITH GFR
AG Ratio: 1.3 (calc) (ref 1.0–2.5)
ALT: 42 U/L — ABNORMAL HIGH (ref 6–29)
AST: 40 U/L — ABNORMAL HIGH (ref 10–35)
Albumin: 4.2 g/dL (ref 3.6–5.1)
Alkaline phosphatase (APISO): 138 U/L (ref 37–153)
BUN: 12 mg/dL (ref 7–25)
CO2: 28 mmol/L (ref 20–32)
Calcium: 9.6 mg/dL (ref 8.6–10.4)
Chloride: 102 mmol/L (ref 98–110)
Creat: 0.93 mg/dL (ref 0.50–1.05)
Globulin: 3.2 g/dL (ref 1.9–3.7)
Glucose, Bld: 111 mg/dL — ABNORMAL HIGH (ref 65–99)
Potassium: 4.5 mmol/L (ref 3.5–5.3)
Sodium: 139 mmol/L (ref 135–146)
Total Bilirubin: 0.4 mg/dL (ref 0.2–1.2)
Total Protein: 7.4 g/dL (ref 6.1–8.1)
eGFR: 69 mL/min/1.73m2 (ref >=60)

## 2023-12-01 LAB — HEMOGLOBIN A1C
Hgb A1c MFr Bld: 6.2 % — ABNORMAL HIGH (ref <5.7)
Mean Plasma Glucose: 131 mg/dL
eAG (mmol/L): 7.3 mmol/L

## 2023-12-01 LAB — SEDIMENTATION RATE: Sed Rate: 43 mm/h — ABNORMAL HIGH (ref 0–30)

## 2023-12-01 LAB — ANA: Anti Nuclear Antibody (ANA): POSITIVE — AB

## 2023-12-01 LAB — ANTI-NUCLEAR AB-TITER (ANA TITER): ANA Titer 1: 1:40 {titer} — ABNORMAL HIGH

## 2023-12-01 LAB — C-REACTIVE PROTEIN: CRP: 9.7 mg/L — ABNORMAL HIGH (ref <8.0)

## 2023-12-05 ENCOUNTER — Encounter: Payer: Self-pay | Admitting: Student in an Organized Health Care Education/Training Program

## 2023-12-05 ENCOUNTER — Ambulatory Visit
Attending: Student in an Organized Health Care Education/Training Program | Admitting: Student in an Organized Health Care Education/Training Program

## 2023-12-05 VITALS — BP 140/76 | HR 83 | Temp 97.8°F | Resp 16 | Ht 64.25 in | Wt 200.0 lb

## 2023-12-05 DIAGNOSIS — M5136 Other intervertebral disc degeneration, lumbar region with discogenic back pain only: Secondary | ICD-10-CM | POA: Insufficient documentation

## 2023-12-05 DIAGNOSIS — G43E09 Chronic migraine with aura, not intractable, without status migrainosus: Secondary | ICD-10-CM | POA: Diagnosis not present

## 2023-12-05 DIAGNOSIS — M48062 Spinal stenosis, lumbar region with neurogenic claudication: Secondary | ICD-10-CM | POA: Diagnosis not present

## 2023-12-05 DIAGNOSIS — G894 Chronic pain syndrome: Secondary | ICD-10-CM | POA: Insufficient documentation

## 2023-12-05 DIAGNOSIS — M47812 Spondylosis without myelopathy or radiculopathy, cervical region: Secondary | ICD-10-CM | POA: Diagnosis not present

## 2023-12-05 DIAGNOSIS — M47816 Spondylosis without myelopathy or radiculopathy, lumbar region: Secondary | ICD-10-CM | POA: Insufficient documentation

## 2023-12-05 DIAGNOSIS — M5412 Radiculopathy, cervical region: Secondary | ICD-10-CM | POA: Insufficient documentation

## 2023-12-05 MED ORDER — SUMATRIPTAN SUCCINATE 100 MG PO TABS: 100.0000 mg | ORAL_TABLET | Freq: Once | ORAL | 2 refills | Status: AC | PRN

## 2023-12-05 NOTE — Progress Notes (Signed)
 Hey PROVIDER NOTE: Interpretation of information contained herein should be left to medically-trained personnel. Specific patient instructions are provided elsewhere under Patient Instructions section of medical record. This document was created in part using AI and STT-dictation technology, any transcriptional errors that may result from this process are unintentional.  Patient: Victoria [name]  Service: E/M Encounter  Provider: Wallie Sherry, MD  DOB: [birth year]  Delivery: Face-to-face  Specialty: Interventional Pain Management  MRN: [id number]  Setting: Ambulatory outpatient facility  Specialty designation: 09  Type: New Patient  Location: Outpatient office facility  PCP: Bernardo Fend, DO  DOS: 12/05/2023    Referring Prov.: Bernardo Fend, DO   Primary Reason(s) for Visit: Encounter for initial evaluation of one or more chronic problems (new to examiner) potentially causing chronic pain, and posing a threat to normal musculoskeletal function. (Level of risk: High) CC: Back Pain, Peripheral Neuropathy, and Neck Pain  HPI  Victoria [name] is a 63 y.o. year old, female patient, who comes for the first time to our practice referred by Bernardo Fend, DO for our initial evaluation of her chronic pain. She has Osteoarthritis of both hips; Dyslipidemia; Hypertension; GERD (gastroesophageal reflux disease); Asthma; Bipolar I disorder, most recent episode manic, severe with psychotic features (HCC); MDD (major depressive disorder), recurrent, severe, with psychosis (HCC); MDD (major depressive disorder), single episode, severe with psychosis (HCC); Psychosis (HCC); Chronic pain syndrome; Pulmonary fibrosis (HCC); Hyperlipidemia; Long term (current) use of opiate analgesic; Nonalcoholic steatohepatitis; OSA (obstructive sleep apnea); Vitamin D  deficiency; Acquired absence of other genital organ(s); Alpha galactosidase deficiency; Attention-deficit hyperactivity disorder, predominantly inattentive  type; Cervical radicular pain; Chronic fatigue; Convulsions (HCC); Corneal abrasion; Elevation of levels of liver transaminase levels; Class 1 obesity due to excess calories with serious comorbidity and body mass index (BMI) of 31.0 to 31.9 in adult; History of melanoma; Hx of total hysterectomy; Impaired fasting glucose; Irritable bowel syndrome; Issue of repeat prescription for medication; Lactose intolerance; Lateral epicondylitis, unspecified elbow; Melanosis coli; Migraine; Mild persistent asthma; Neuropathy; Numbness and tingling in both hands; Osteopenia; Pain medication agreement; Pernicious anemia; Prediabetes; Right hip pain; Sacroiliitis (HCC); Dysthymic disorder; Screening for colon cancer; Skin lesion; Spinal stenosis, lumbar region, with neurogenic claudication; Spondylolisthesis of lumbar region; Stage 3a chronic kidney disease (HCC); Suicidal thoughts; SVT (supraventricular tachycardia) (HCC); Tinnitus, unspecified ear; Traumatic brain injury (HCC); Vaginal atrophy; At low risk for fall; Endometriosis; Nausea and vomiting; Chronic bilateral low back pain with bilateral sciatica; Spinal instability, lumbar; Postoperative CSF leak; Spondylolisthesis; Acute respiratory failure with hypoxia (HCC); Obesity (BMI 30-39.9); Lumbar facet arthropathy; Degeneration of intervertebral disc of lumbar region with discogenic back pain; and Cervical facet joint syndrome on their problem list. Today she comes in for evaluation of her Back Pain, Peripheral Neuropathy, and Neck Pain  Pain Assessment: Location: Lower Back Radiating: numbness and tingling radiates down legs bilat to feet and toes, Onset:   Duration: Chronic pain Quality: Aching, Numbness, Tingling Severity: 5 /10 (subjective, self-reported pain score)  Effect on ADL: limits adls Timing:   Modifying factors: walking helps for 10 minutes then must lay down BP: (!) 140/76  HR: 83  Onset and Duration: Present longer than 3 months Cause of  pain: Unknown Severity: Getting worse, NAS-11 at its worse: 10/10, NAS-11 at its best: 2/10, NAS-11 now: 4/10, and NAS-11 on the average: 5/10 Timing: Morning, Noon, Afternoon, Not influenced by the time of the day, During activity or exercise, After activity or exercise, and After a period of immobility Aggravating Factors: Bending, Bowel movements,  Climbing, Eating, Intercourse (sex), Kneeling, Lifiting, Motion, Prolonged sitting, Prolonged standing, Squatting, Stooping , Twisting, Walking, Walking uphill, Walking downhill, and Working Alleviating Factors: Bending, Stretching, Cold packs, Hot packs, Resting, Sitting, Standing, Relaxation therapy, and physical therapy Associated Problems: Constipation, Day-time cramps, Night-time cramps, Depression, Dizziness, Fatigue, Inability to concentrate, Inability to control bladder (urine), Inability to control bowel, Nausea, Numbness, Sadness, Sweating, Swelling, Temperature changes, Tingling, Vomiting , Weakness, Pain that wakes patient up, and Pain that does not allow patient to sleep Quality of Pain: Aching, Agonizing, Annoying, Burning, Constant, Cramping, Cruel, Deep, Disabling, Distressing, Dreadful, Dull, Exhausting, Fearful, Feeling of constriction, Feeling of weight, Getting longer, Heavy, Horrible, Hot, Itching, Nagging, Pressure-like, Pulsating, Punishing, Sharp, Shooting, Sickening, Splitting, Stabbing, Superficial, Tender, Throbbing, Tingling, Tiring, and Uncomfortable Previous Examinations or Tests: Bone scan, CT scan, MRI scan, X-rays, Nerve conduction test, Neurological evaluation, Neurosurgical evaluation, and Psychiatric evaluation Previous Treatments: Epidural steroid injections, Narcotic medications, Physical Therapy, Pool exercises, and TENS  Victoria [name] is being evaluated for possible interventional pain management therapies for the treatment of her chronic pain.   Discussed the use of AI scribe software for clinical note transcription  with the patient, who gave verbal consent to proceed.  History of Present Illness   Victoria [name] is a 63 year old female with chronic pain and recent lumbar spine surgery (L4-5 fusion 10/17/22)  who presents for pain management consultation.  She has experienced chronic pain for most of her adult life, which has intensified over the past ten years, becoming constant and severe. The pain is primarily located in her lower back and neck, associated with a history of spinal stenosis and spondylolisthesis. She underwent minimally invasive lumbar fusion surgery at L4-L5 on Oct 17, 2023 with Dr. Katrina  Her pain is persistent and affects her ability to lift her arms, with associated numbness in her arms and hands. She experiences muscle twitching and electric shock-like sensations throughout her body, which interfere with daily activities such as typing on a computer.  Her MRI below reveals bilateral C6 foraminal stenosis.  Following her recent back surgery, she experienced complications, including a significant spinal fluid leak, leading to persistent headaches and nausea. The headaches are described as 'weird' and are sometimes accompanied by auditory hallucinations. She has a history of migraines but is not currently on any migraine-specific medication.  She is currently taking oxycodone  10 mg four times a day but wishes to taper off due to concerns about side effects, including incontinence and potential contribution to her depression. She has been on oxycodone  for seven years and has attempted to wean off it multiple times without a structured plan.  She also has a history of carpal tunnel syndrome, for which she has used wrist braces in the past. She is not currently using them as the symptoms have not been as bothersome recently.  She is taking gabapentin  and has previously used a Butrans patch, which was discontinued prior to her surgery.       Ms. Wolden has been informed that this  initial visit was an evaluation only.  On the follow up appointment I will go over the results, including ordered tests and available interventional therapies. At that time she will have the opportunity to decide whether to proceed with offered therapies or not. In the event that Ms. Coover prefers avoiding interventional options, this will conclude our involvement in the case.  Medication management recommendations may be provided upon request.  Patient informed that diagnostic tests may be ordered to  assist in identifying underlying causes, narrow the list of differential diagnoses and aid in determining candidacy for (or contraindications to) planned therapeutic interventions.  Historic Controlled Substance Pharmacotherapy Review PMP and historical list of controlled substances: Oxycodone  10 mg every 6 hours as needed Historical Monitoring: The patient  reports no history of drug use. List of prior UDS Testing: Lab Results  Component Value Date   MDMA NONE DETECTED 11/14/2019   MDMA NONE DETECTED 07/05/2015   COCAINSCRNUR NONE DETECTED 11/14/2019   COCAINSCRNUR NONE DETECTED 07/05/2015   PCPSCRNUR NONE DETECTED 11/14/2019   PCPSCRNUR NONE DETECTED 07/05/2015   THCU NONE DETECTED 11/14/2019   THCU NONE DETECTED 07/05/2015   ETH <10 11/14/2019   ETH <5 07/05/2015   Historical Background Evaluation: [place] PMP: PDMP not reviewed this encounter. Review of the past 35-months conducted.              [place] Department of public safety, offender search: Engineer, mining Information) Non-contributory Risk Assessment Profile: Aberrant behavior: None observed or detected today Risk factors for fatal opioid overdose: bipolar disorder Fatal overdose hazard ratio (HR): Calculation deferred Non-fatal overdose hazard ratio (HR): Calculation deferred Risk of opioid abuse or dependence: 0.7-3.0% with doses <= 36 MME/day and 6.1-26% with doses >= 120 MME/day. Substance use disorder (SUD) risk level: See  below Personal History of Substance Abuse (SUD-Substance use disorder):  Alcohol : Negative  Illegal Drugs: Negative  Rx Drugs: Negative  ORT Risk Level calculation: High Risk  Opioid Risk Tool - 12/05/23 1439       Family History of Substance Abuse   Alcohol  Positive Female    Illegal Drugs Positive Female    Rx Drugs Positive Female or Female      Personal History of Substance Abuse   Alcohol  Negative    Illegal Drugs Negative    Rx Drugs Negative      Age   Age between 46-45 years  No      Psychological Disease   Psychological Disease Negative    Depression Positive      Total Score   Opioid Risk Tool Scoring 8    Opioid Risk Interpretation High Risk         ORT Scoring interpretation table:  Score <3 = Low Risk for SUD  Score between 4-7 = Moderate Risk for SUD  Score >8 = High Risk for Opioid Abuse   PHQ-2 Depression Scale:  Total score: 6  PHQ-2 Scoring interpretation table: (Score and probability of major depressive disorder)  Score 0 = No depression  Score 1 = 15.4% Probability  Score 2 = 21.1% Probability  Score 3 = 38.4% Probability  Score 4 = 45.5% Probability  Score 5 = 56.4% Probability  Score 6 = 78.6% Probability   PHQ-9 Depression Scale:  Total score: 6  PHQ-9 Scoring interpretation table:  Score 0-4 = No depression  Score 5-9 = Mild depression  Score 10-14 = Moderate depression  Score 15-19 = Moderately severe depression  Score 20-27 = Severe depression (2.4 times higher risk of SUD and 2.89 times higher risk of overuse)   Pharmacologic Plan: As per protocol, I have not taken over any controlled substance management, pending the results of ordered tests and/or consults.            Initial impression: Pending review of available data and ordered tests.  Meds   Current Outpatient Medications:    albuterol  (VENTOLIN  HFA) 108 (90 Base) MCG/ACT inhaler, Inhale 1-2 puffs into the lungs every 6 (six)  hours as needed for wheezing or shortness of  breath., Disp: 18 g, Rfl: 1   B Complex Vitamins (B-COMPLEX/B-12 PO), Take by mouth., Disp: , Rfl:    benzonatate  (TESSALON  PERLES) 100 MG capsule, Take 1 capsule (100 mg total) by mouth every 6 (six) hours as needed for cough., Disp: 30 capsule, Rfl: 0   cholecalciferol  (VITAMIN D ) 25 MCG tablet, Take 1 tablet (1,000 Units total) by mouth daily., Disp: 30 tablet, Rfl: 1   DULoxetine  (CYMBALTA ) 30 MG capsule, Take 3 capsules (90 mg total) by mouth daily., Disp: 90 capsule, Rfl: 1   gabapentin  (NEURONTIN ) 300 MG capsule, Take 3 capsules (900 mg total) by mouth 3 (three) times daily. (Patient taking differently: Take 800 mg by mouth 3 (three) times daily.), Disp: 90 capsule, Rfl: 0   ipratropium-albuterol  (DUONEB) 0.5-2.5 (3) MG/3ML SOLN, Take 3 mLs by nebulization 3 (three) times daily as needed (cough wheeze SOB bronchitis/asthma symptoms)., Disp: 180 mL, Rfl: 1   ketoconazole  (NIZORAL ) 2 % cream, Apply 1 Application topically daily., Disp: 15 g, Rfl: 0   losartan  (COZAAR ) 25 MG tablet, Take 1 tablet (25 mg total) by mouth daily., Disp: 90 tablet, Rfl: 0   miconazole (MONISTAT 1 COMBINATION PACK) kit, Place 1 each vaginally once., Disp: , Rfl:    MOVANTIK  25 MG TABS tablet, Take 1 tablet (25 mg total) by mouth daily as needed (Constipation)., Disp: 90 tablet, Rfl: 1   Multiple Vitamins-Minerals (MULTIVITAMIN WITH MINERALS) tablet, Take 1 tablet by mouth daily., Disp: , Rfl:    ondansetron  (ZOFRAN ) 8 MG tablet, Take 1 tablet (8 mg total) by mouth every 8 (eight) hours as needed for nausea or vomiting., Disp: 20 tablet, Rfl: 0   ondansetron  (ZOFRAN -ODT) 4 MG disintegrating tablet, Take 1 tablet (4 mg total) by mouth every 8 (eight) hours as needed for nausea or vomiting., Disp: 20 tablet, Rfl: 0   Oxycodone  HCl 10 MG TABS, Take 1 tablet (10 mg total) by mouth every 6 (six) hours as needed (severe pain)., Disp: 20 tablet, Rfl: 0   pantoprazole  (PROTONIX ) 40 MG tablet, TAKE 1 TABLET BY MOUTH EVERY DAY IN  THE MORNING, Disp: 90 tablet, Rfl: 0   polyvinyl alcohol  (LIQUIFILM TEARS) 1.4 % ophthalmic solution, Place 1 drop into both eyes 3 (three) times daily., Disp: 15 mL, Rfl: 1   QUEtiapine  (SEROQUEL ) 100 MG tablet, Take 1 tablet (100 mg total) by mouth 2 (two) times daily., Disp: 60 tablet, Rfl: 1   SUMAtriptan  (IMITREX ) 100 MG tablet, Take 1 tablet (100 mg total) by mouth once as needed for migraine. May repeat in 2 hours if headache persists or recurs., Disp: 30 tablet, Rfl: 2   traZODone  (DESYREL ) 50 MG tablet, Take 1 tablet (50 mg total) by mouth at bedtime., Disp: 30 tablet, Rfl: 1  Imaging Review  Cervical Imaging: Cervical MR wo contrast: Results for orders placed during the hospital encounter of 10/08/21  MR CERVICAL SPINE WO CONTRAST  Narrative CLINICAL DATA:  Initial evaluation for central with right greater than left neck, shoulder, and arm pain, right greater than left hand numbness.  EXAM: MRI CERVICAL, THORACIC AND LUMBAR SPINE WITHOUT CONTRAST  TECHNIQUE: Multiplanar and multiecho pulse sequences of the cervical spine, to include the craniocervical junction and cervicothoracic junction, and thoracic and lumbar spine, were obtained without intravenous contrast.  COMPARISON:  None Available.  FINDINGS: MRI CERVICAL SPINE FINDINGS  Alignment: Examination mildly degraded by motion artifact.  Straightening with mild reversal of the normal cervical lordosis.  No listhesis.  Vertebrae: Vertebral body height maintained without acute or chronic fracture. Bone marrow signal intensity within normal limits. No discrete or worrisome osseous lesions. No abnormal marrow edema.  Cord: Normal signal and morphology.  Posterior Fossa, vertebral arteries, paraspinal tissues: Visualized brain and posterior fossa within normal limits. Craniocervical junction normal. Paraspinous and prevertebral soft tissues within normal limits. Normal intravascular flow voids seen within  the vertebral arteries bilaterally.  Disc levels:  C2-C3: Unremarkable.  C3-C4: Minimal disc bulge with right-sided uncovertebral spurring. No significant spinal stenosis. Mild right C4 foraminal narrowing. Left neural foramen is patent.  C4-C5: Degenerative intervertebral disc space narrowing with diffuse disc bulge. Associated endplate and uncovertebral spurring. Flattening and partial effacement of the ventral thecal sac. Thecal sac remains fairly capacious without significant spinal stenosis. Mild right greater than left C5 foraminal stenosis.  C5-C6: Degenerative intervertebral disc space narrowing with diffuse disc osteophyte complex. Broad posterior component flattens and partially effaces the ventral thecal sac, slightly asymmetric to the right. Mild spinal stenosis. Moderate to severe bilateral C6 foraminal narrowing.  C6-C7: Degenerative intervertebral disc space narrowing with diffuse disc osteophyte complex. Flattening of the ventral thecal sac without significant spinal stenosis. Left greater than right uncovertebral spurring with resultant moderate left and mild right C7 foraminal stenosis.  C7-T1: Negative interspace. Left greater than right facet hypertrophy. No stenosis.  MRI THORACIC SPINE FINDINGS  Alignment:  Examination somewhat degraded by motion artifact.  Exaggeration of the normal upper thoracic kyphosis.  No listhesis.  Vertebrae: Vertebral body height maintained without acute or chronic fracture. Bone marrow signal intensity within normal limits. Subcentimeter benign hemangioma noted within the T8 vertebral body. No worrisome osseous lesions. Mild discogenic reactive endplate change noted about the T7-8, T9-10 and T10-11 interspaces anteriorly. No other abnormal marrow edema.  Cord:  Normal signal and morphology.  Paraspinal and other soft tissues: Unremarkable.  Disc levels:  Minimal for age noncompressive disc bulging noted at T6-7  through T10-11. Mild reactive endplate change with marrow edema about the T7-8, T9-10 and T10-11 interspace anteriorly. Single small right paracentral disc herniation present at T7-8 (series 29, image 23). No other focal disc herniation. No significant facet pathology. No spinal stenosis or cord impingement. Foramina remain widely patent.  MRI LUMBAR SPINE FINDINGS  Segmentation:  Examination degraded by motion artifact.  Standard segmentation. Lowest well-formed disc space labeled the L5-S1 level.  Alignment: 6 mm anterolisthesis of L4 on L5, chronic and facet mediated. Alignment otherwise normal with preservation of the normal lumbar lordosis.  Vertebrae: Vertebral body height maintained without acute or chronic fracture. Bone marrow signal intensity within normal limits. No discrete or worrisome osseous lesions. Reactive marrow edema present about the L4-5 facets bilaterally due to facet arthritis. No other abnormal marrow edema.  Conus medullaris and cauda equina: Conus extends to the L1 level. Conus and cauda equina appear normal.  Paraspinal and other soft tissues: Unremarkable.  Disc levels:  L1-2:  Unremarkable.  L2-3: Mild annular disc bulge. No significant spinal stenosis. Foramina remain patent.  L3-4: Mild annular disc bulge. Mild facet hypertrophy. No significant spinal stenosis. Foramina remain patent.  L4-5: 6 mm anterolisthesis. Associated degenerative intervertebral disc space narrowing with disc desiccation and broad posterior pseudo disc bulge/uncovering. Moderate facet and ligament flavum hypertrophy with associated trace joint effusions and reactive marrow edema. Resultant severe spinal stenosis with mild bilateral L4 foraminal narrowing, slightly worse on the right.  L5-S1: Negative interspace. Mild to moderate facet hypertrophy. No stenosis.  IMPRESSION: MRI CERVICAL SPINE  IMPRESSION:  1. Mild-to-moderate degenerative spondylosis at C4-5  through C6-7 with resultant mild spinal stenosis at C5-6. No frank cord impingement. 2. Multifactorial degenerative changes with resultant multilevel foraminal narrowing as above. Notable findings include moderate to severe bilateral C6 foraminal stenosis with moderate left C7 foraminal narrowing.  MRI THORACIC SPINE IMPRESSION:  1. Mild for age spondylosis at T6-7 through T10-11, with single small right paracentral disc protrusion at T7-8. No significant stenosis or neural impingement. 2. Mild discogenic reactive endplate change with marrow edema about the T7-8, T9-10 and T10-11 interspaces. Findings could contribute to back pain.  MRI LUMBAR SPINE IMPRESSION:  1. 6 mm facet mediated anterolisthesis at L4-5 with resultant severe spinal stenosis, with mild bilateral L4 foraminal narrowing. 2. Mild to moderate facet hypertrophy at L5-S1 without stenosis.   Electronically Signed By: Morene Hoard M.D. On: 10/09/2021 06:20   MR THORACIC SPINE WO CONTRAST  Narrative CLINICAL DATA:  Initial evaluation for central with right greater than left neck, shoulder, and arm pain, right greater than left hand numbness.  EXAM: MRI CERVICAL, THORACIC AND LUMBAR SPINE WITHOUT CONTRAST  TECHNIQUE: Multiplanar and multiecho pulse sequences of the cervical spine, to include the craniocervical junction and cervicothoracic junction, and thoracic and lumbar spine, were obtained without intravenous contrast.  COMPARISON:  None Available.  FINDINGS: MRI CERVICAL SPINE FINDINGS  Alignment: Examination mildly degraded by motion artifact.  Straightening with mild reversal of the normal cervical lordosis. No listhesis.  Vertebrae: Vertebral body height maintained without acute or chronic fracture. Bone marrow signal intensity within normal limits. No discrete or worrisome osseous lesions. No abnormal marrow edema.  Cord: Normal signal and morphology.  Posterior Fossa, vertebral  arteries, paraspinal tissues: Visualized brain and posterior fossa within normal limits. Craniocervical junction normal. Paraspinous and prevertebral soft tissues within normal limits. Normal intravascular flow voids seen within the vertebral arteries bilaterally.  Disc levels:  C2-C3: Unremarkable.  C3-C4: Minimal disc bulge with right-sided uncovertebral spurring. No significant spinal stenosis. Mild right C4 foraminal narrowing. Left neural foramen is patent.  C4-C5: Degenerative intervertebral disc space narrowing with diffuse disc bulge. Associated endplate and uncovertebral spurring. Flattening and partial effacement of the ventral thecal sac. Thecal sac remains fairly capacious without significant spinal stenosis. Mild right greater than left C5 foraminal stenosis.  C5-C6: Degenerative intervertebral disc space narrowing with diffuse disc osteophyte complex. Broad posterior component flattens and partially effaces the ventral thecal sac, slightly asymmetric to the right. Mild spinal stenosis. Moderate to severe bilateral C6 foraminal narrowing.  C6-C7: Degenerative intervertebral disc space narrowing with diffuse disc osteophyte complex. Flattening of the ventral thecal sac without significant spinal stenosis. Left greater than right uncovertebral spurring with resultant moderate left and mild right C7 foraminal stenosis.  C7-T1: Negative interspace. Left greater than right facet hypertrophy. No stenosis.  MRI THORACIC SPINE FINDINGS  Alignment:  Examination somewhat degraded by motion artifact.  Exaggeration of the normal upper thoracic kyphosis.  No listhesis.  Vertebrae: Vertebral body height maintained without acute or chronic fracture. Bone marrow signal intensity within normal limits. Subcentimeter benign hemangioma noted within the T8 vertebral body. No worrisome osseous lesions. Mild discogenic reactive endplate change noted about the T7-8, T9-10 and T10-11  interspaces anteriorly. No other abnormal marrow edema.  Cord:  Normal signal and morphology.  Paraspinal and other soft tissues: Unremarkable.  Disc levels:  Minimal for age noncompressive disc bulging noted at T6-7 through T10-11. Mild reactive endplate change with marrow edema about the T7-8, T9-10 and T10-11 interspace anteriorly.  Single small right paracentral disc herniation present at T7-8 (series 29, image 23). No other focal disc herniation. No significant facet pathology. No spinal stenosis or cord impingement. Foramina remain widely patent.  MRI LUMBAR SPINE FINDINGS  Segmentation:  Examination degraded by motion artifact.  Standard segmentation. Lowest well-formed disc space labeled the L5-S1 level.  Alignment: 6 mm anterolisthesis of L4 on L5, chronic and facet mediated. Alignment otherwise normal with preservation of the normal lumbar lordosis.  Vertebrae: Vertebral body height maintained without acute or chronic fracture. Bone marrow signal intensity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 candidate for interventional therapies. The decision will be based on the results of diagnostic studies, as well as Ms. Matherne's risk profile.  Procedure(s) under consideration:  Cervical epidural steroid injection Cervical facet medial branch nerve block Botox Occipital nerve block Lumbar facet medial branch nerve block Cluneal nerve block SI joint SCS trial    Provider-requested follow-up: Return in about 2 weeks (around 12/19/2023) for C-ESI, in clinic IV Versed .  Future Appointments  Date Time Provider Department Center  12/20/2023  2:20 PM Helon Clotilda LABOR, PA-C [facility] None  01/03/2024  2:30 PM Hilma Hastings, PA-C [facility] None  01/19/2024 10:00 AM Hester Alm BROCKS, MD [facility] None  02/03/2024  1:00 PM Leigh Venetia CROME, MD [facility] None  03/01/2024  1:20 PM Bernardo Fend, DO [facility]  08/30/2024  2:40 PM [facility]AL WELLNESS VISIT [facility]   I discussed the assessment and treatment plan with the patient. The patient was provided an opportunity to ask questions and all were answered. The patient agreed with the plan and demonstrated an understanding of the instructions.  Patient advised to call back or seek an in-person evaluation if the symptoms or condition worsens.  Duration of encounter: .  Total time on encounter, as per AMA guidelines included both the face-to-face and non-face-to-face time personally spent by the physician and/or other qualified health care professional(s) on the day of the encounter (includes time in activities that require the physician or other qualified health care professional and does not include time in  activities normally performed by clinical staff). Physician's time may include the following activities when performed: Preparing to see the patient (e.g., pre-charting review of records, searching for previously ordered imaging, lab work, and nerve conduction tests) Review of prior analgesic pharmacotherapies. Reviewing PMP Interpreting ordered tests (e.g., lab work, imaging, nerve conduction tests) Performing post-procedure evaluations, including interpretation of diagnostic procedures Obtaining and/or reviewing separately obtained history Performing a medically appropriate examination and/or evaluation Counseling and educating the patient/family/caregiver Ordering medications, tests, or procedures Referring and communicating with other health care professionals (when not separately reported) Documenting clinical information in the electronic or other health record Independently interpreting results (not separately reported) and communicating results to the patient/ family/caregiver Care coordination (not separately reported)  Note by: Wallie Sherry, MD (TTS and AI technology used. I apologize for any typographical errors that were not detected and corrected.) Date: 12/05/2023; Time: 4:10 PM

## 2023-12-05 NOTE — Progress Notes (Signed)
 Safety precautions to be maintained throughout the outpatient stay will include: orient to surroundings, keep bed in low position, maintain call bell within reach at all times, provide assistance with transfer out of bed and ambulation.

## 2023-12-05 NOTE — Patient Instructions (Signed)
 Moderate Conscious Sedation, Adult Sedation is the use of medicines to help you relax and not feel pain. Moderate conscious sedation is a type of sedation that makes you less alert than normal. You are still able to respond to instructions, touch, or both. This type of sedation is used during short medical and dental procedures. It is milder than deep sedation, which is a type of sedation you cannot be easily woken up from. It is also milder than general anesthesia, which is the use of medicines to make you fall asleep. Moderate conscious sedation lets you return to your normal activities sooner. Tell a health care provider about: Any allergies you have. All medicines you are taking, including vitamins, herbs, steroids, eye drops, creams, and over-the-counter medicines. Any problems you or family members have had with anesthesia. Any bleeding problems you have. Any surgeries you have had. Any medical conditions you have. Whether you are pregnant or may be pregnant. Any recent alcohol, tobacco, or drug use. What are the risks? Your health care provider will talk with you about risks. These may include: Oversedation. This is when you get too much medicine. Nausea or vomiting. Allergic reaction to medicines. Trouble breathing. If this happens, a breathing tube may be used. It will be removed when you can breathe better on your own. Heart trouble. Lung trouble. Emergence delirium. This is when you feel confused while the sedation wears off. This gets better with time. What happens before the procedure? When to stop eating and drinking Follow instructions from your health care provider about what you may eat and drink. These may include: 8 hours before your procedure Stop eating most foods. Do not eat meat, fried foods, or fatty foods. Eat only light foods, such as toast or crackers. All liquids are okay except energy drinks and alcohol. 6 hours before your procedure Stop eating. Drink only  clear liquids, such as water, clear fruit juice, black coffee, plain tea, and sports drinks. Do not drink energy drinks or alcohol. 2 hours before your procedure Stop drinking all liquids. You may be allowed to take medicines with small sips of water. If you do not follow your health care provider's instructions, your procedure may be delayed or canceled. Medicines Ask your health care provider about: Changing or stopping your regular medicines. These include any diabetes medicines or blood thinners you take. Taking medicines such as aspirin and ibuprofen. These medicines can thin your blood. Do not take them unless your health care provider tells you to. Taking over-the-counter medicines, vitamins, herbs, and supplements. Tests and exams You may have an exam or testing. You may have a blood or urine sample taken. General instructions Do not use any products that contain nicotine or tobacco for at least 4 weeks before the procedure. These products include cigarettes, chewing tobacco, and vaping devices, such as e-cigarettes. If you need help quitting, ask your health care provider. If you will be going home right after the procedure, plan to have a responsible adult: Take you home from the hospital or clinic. You will not be allowed to drive. Care for you for the time you are told. What happens during the procedure?  You will be given the sedative. It may be given: As a pill you can take by mouth. It can also be put into the rectum. As a spray through the nose. As an injection into muscle. As an injection into a vein through an IV. You may be given oxygen as needed. Your blood pressure, heart  rate, breathing rate, and blood oxygen level will be monitored during the procedure. The medical or dental procedure will be done. The procedure may vary among health care providers and hospitals. What happens after the procedure? Your blood pressure, heart rate, breathing rate, and blood oxygen  level will be monitored until you leave the hospital or clinic. You will get fluids through an IV as needed. Do not drive or operate machinery until your health care provider says that it is safe. This information is not intended to replace advice given to you by your health care provider. Make sure you discuss any questions you have with your health care provider. Document Revised: 11/30/2021 Document Reviewed: 11/30/2021 Elsevier Patient Education  2024 [employer]GENERAL RISKS AND COMPLICATIONS  What are the risk, side effects and possible complications? Generally speaking, most procedures are safe.  However, with any procedure there are risks, side effects, and the possibility of complications.  The risks and complications are dependent upon the sites that are lesioned, or the type of nerve block to be performed.  The closer the procedure is to the spine, the more serious the risks are.  Great care is taken when placing the radio frequency needles, block needles or lesioning probes, but sometimes complications can occur. Infection: Any time there is an injection through the skin, there is a risk of infection.  This is why sterile conditions are used for these blocks.  There are four possible types of infection. Localized skin infection. Central Nervous System Infection-This can be in the form of Meningitis, which can be deadly. Epidural Infections-This can be in the form of an epidural abscess, which can cause pressure inside of the spine, causing compression of the spinal cord with subsequent paralysis. This would require an emergency surgery to decompress, and there are no guarantees that the patient would recover from the paralysis. Discitis-This is an infection of the intervertebral discs.  It occurs in about 1% of discography procedures.  It is difficult to treat and it may lead to surgery.        2. Pain: the needles have to go through skin and soft tissues, will cause soreness.        3. Damage to internal structures:  The nerves to be lesioned may be near blood vessels or    other nerves which can be potentially damaged.       4. Bleeding: Bleeding is more common if the patient is taking blood thinners such as  aspirin, Coumadin, Ticiid, Plavix, etc., or if he/she have some genetic predisposition  such as hemophilia. Bleeding into the spinal canal can cause compression of the spinal  cord with subsequent paralysis.  This would require an emergency surgery to  decompress and there are no guarantees that the patient would recover from the  paralysis.       5. Pneumothorax:  Puncturing of a lung is a possibility, every time a needle is introduced in  the area of the chest or upper back.  Pneumothorax refers to free air around the  collapsed lung(s), inside of the thoracic cavity (chest cavity).  Another two possible  complications related to a similar event would include: Hemothorax and Chylothorax.   These are variations of the Pneumothorax, where instead of air around the collapsed  lung(s), you may have blood or chyle, respectively.       6. Spinal headaches: They may occur with any procedures in the area of the spine.       7.  Persistent CSF (Cerebro-Spinal Fluid) leakage: This is a rare problem, but may occur  with prolonged intrathecal or epidural catheters either due to the formation of a fistulous  track or a dural tear.       8. Nerve damage: By working so close to the spinal cord, there is always a possibility of  nerve damage, which could be as serious as a permanent spinal cord injury with  paralysis.       9. Death:  Although rare, severe deadly allergic reactions known as "Anaphylactic  reaction" can occur to any of the medications used.      10. Worsening of the symptoms:  We can always make thing worse.  What are the chances of something like this happening? Chances of any of this occuring are extremely low.  By statistics, you have more of a chance of getting killed in a  motor vehicle accident: while driving to the hospital than any of the above occurring .  Nevertheless, you should be aware that they are possibilities.  In general, it is similar to taking a shower.  Everybody knows that you can slip, hit your head and get killed.  Does that mean that you should not shower again?  Nevertheless always keep in mind that statistics do not mean anything if you happen to be on the wrong side of them.  Even if a procedure has a 1 (one) in a 1,000,000 (million) chance of going wrong, it you happen to be that one..Also, keep in mind that by statistics, you have more of a chance of having something go wrong when taking medications.  Who should not have this procedure? If you are on a blood thinning medication (e.g. Coumadin, Plavix, see list of "Blood Thinners"), or if you have an active infection going on, you should not have the procedure.  If you are taking any blood thinners, please inform your physician.  How should I prepare for this procedure? Do not eat or drink anything at least six hours prior to the procedure. Bring a driver with you .  It cannot be a taxi. Come accompanied by an adult that can drive you back, and that is strong enough to help you if your legs get weak or numb from the local anesthetic. Take all of your medicines the morning of the procedure with just enough water to swallow them. If you have diabetes, make sure that you are scheduled to have your procedure done first thing in the morning, whenever possible. If you have diabetes, take only half of your insulin dose and notify our nurse that you have done so as soon as you arrive at the clinic. If you are diabetic, but only take blood sugar pills (oral hypoglycemic), then do not take them on the morning of your procedure.  You may take them after you have had the procedure. Do not take aspirin or any aspirin-containing medications, at least eleven (11) days prior to the procedure.  They may prolong  bleeding. Wear loose fitting clothing that may be easy to take off and that you would not mind if it got stained with Betadine or blood. Do not wear any jewelry or perfume Remove any nail coloring.  It will interfere with some of our monitoring equipment.  NOTE: Remember that this is not meant to be interpreted as a complete list of all possible complications.  Unforeseen problems may occur.  BLOOD THINNERS The following drugs contain aspirin or other products, which can cause increased  bleeding during surgery and should not be taken for 2 weeks prior to and 1 week after surgery.  If you should need take something for relief of minor pain, you may take acetaminophen which is found in Tylenol,m Datril, Anacin-3 and Panadol. It is not blood thinner. The products listed below are.  Do not take any of the products listed below in addition to any listed on your instruction sheet.  A.P.C or A.P.C with Codeine Codeine Phosphate Capsules #3 Ibuprofen Ridaura  ABC compound Congesprin Imuran rimadil  Advil Cope Indocin Robaxisal  Alka-Seltzer Effervescent Pain Reliever and Antacid Coricidin or Coricidin-D  Indomethacin Rufen  Alka-Seltzer plus Cold Medicine Cosprin Ketoprofen S-A-C Tablets  Anacin Analgesic Tablets or Capsules Coumadin Korlgesic Salflex  Anacin Extra Strength Analgesic tablets or capsules CP-2 Tablets Lanoril Salicylate  Anaprox Cuprimine Capsules Levenox Salocol  Anexsia-D Dalteparin Magan Salsalate  Anodynos Darvon compound Magnesium Salicylate Sine-off  Ansaid Dasin Capsules Magsal Sodium Salicylate  Anturane Depen Capsules Marnal Soma  APF Arthritis pain formula Dewitt's Pills Measurin Stanback  Argesic Dia-Gesic Meclofenamic Sulfinpyrazone  Arthritis Bayer Timed Release Aspirin Diclofenac Meclomen Sulindac  Arthritis pain formula Anacin Dicumarol Medipren Supac  Analgesic (Safety coated) Arthralgen Diffunasal Mefanamic Suprofen  Arthritis Strength Bufferin Dihydrocodeine Mepro  Compound Suprol  Arthropan liquid Dopirydamole Methcarbomol with Aspirin Synalgos  ASA tablets/Enseals Disalcid Micrainin Tagament  Ascriptin Doan's Midol Talwin  Ascriptin A/D Dolene Mobidin Tanderil  Ascriptin Extra Strength Dolobid Moblgesic Ticlid  Ascriptin with Codeine Doloprin or Doloprin with Codeine Momentum Tolectin  Asperbuf Duoprin Mono-gesic Trendar  Aspergum Duradyne Motrin or Motrin IB Triminicin  Aspirin plain, buffered or enteric coated Durasal Myochrisine Trigesic  Aspirin Suppositories Easprin Nalfon Trillsate  Aspirin with Codeine Ecotrin Regular or Extra Strength Naprosyn Uracel  Atromid-S Efficin Naproxen Ursinus  Auranofin Capsules Elmiron Neocylate Vanquish  Axotal Emagrin Norgesic Verin  Azathioprine Empirin or Empirin with Codeine Normiflo Vitamin E  Azolid Emprazil Nuprin Voltaren  Bayer Aspirin plain, buffered or children's or timed BC Tablets or powders Encaprin Orgaran Warfarin Sodium  Buff-a-Comp Enoxaparin Orudis Zorpin  Buff-a-Comp with Codeine Equegesic Os-Cal-Gesic   Buffaprin Excedrin plain, buffered or Extra Strength Oxalid   Bufferin Arthritis Strength Feldene Oxphenbutazone   Bufferin plain or Extra Strength Feldene Capsules Oxycodone with Aspirin   Bufferin with Codeine Fenoprofen Fenoprofen Pabalate or Pabalate-SF   Buffets II Flogesic Panagesic   Buffinol plain or Extra Strength Florinal or Florinal with Codeine Panwarfarin   Buf-Tabs Flurbiprofen Penicillamine   Butalbital Compound Four-way cold tablets Penicillin   Butazolidin Fragmin Pepto-Bismol   Carbenicillin Geminisyn Percodan   Carna Arthritis Reliever Geopen Persantine   Carprofen Gold's salt Persistin   Chloramphenicol Goody's Phenylbutazone   Chloromycetin Haltrain Piroxlcam   Clmetidine heparin Plaquenil   Cllnoril Hyco-pap Ponstel   Clofibrate Hydroxy chloroquine Propoxyphen         Before stopping any of these medications, be sure to consult the physician who ordered them.   Some, such as Coumadin (Warfarin) are ordered to prevent or treat serious conditions such as "deep thrombosis", "pumonary embolisms", and other heart problems.  The amount of time that you may need off of the medication may also vary with the medication and the reason for which you were taking it.  If you are taking any of these medications, please make sure you notify your pain physician before you undergo any procedures.         Epidural Steroid Injection Patient Information  Description: The epidural space surrounds the nerves as they  exit the spinal cord.  In some patients, the nerves can be compressed and inflamed by a bulging disc or a tight spinal canal (spinal stenosis).  By injecting steroids into the epidural space, we can bring irritated nerves into direct contact with a potentially helpful medication.  These steroids act directly on the irritated nerves and can reduce swelling and inflammation which often leads to decreased pain.  Epidural steroids may be injected anywhere along the spine and from the neck to the low back depending upon the location of your pain.   After numbing the skin with local anesthetic (like Novocaine), a small needle is passed into the epidural space slowly.  You may experience a sensation of pressure while this is being done.  The entire block usually last less than 10 minutes.  Conditions which may be treated by epidural steroids:  Low back and leg pain Neck and arm pain Spinal stenosis Post-laminectomy syndrome Herpes zoster (shingles) pain Pain from compression fractures  Preparation for the injection:  Do not eat any solid food or dairy products within 8 hours of your appointment.  You may drink clear liquids up to 3 hours before appointment.  Clear liquids include water, black coffee, juice or soda.  No milk or cream please. You may take your regular medication, including pain medications, with a sip of water before your appointment  Diabetics  should hold regular insulin (if taken separately) and take 1/2 normal NPH dos the morning of the procedure.  Carry some sugar containing items with you to your appointment. A driver must accompany you and be prepared to drive you home after your procedure.  Bring all your current medications with your. An IV may be inserted and sedation may be given at the discretion of the physician.   A blood pressure cuff, EKG and other monitors will often be applied during the procedure.  Some patients may need to have extra oxygen administered for a short period. You will be asked to provide medical information, including your allergies, prior to the procedure.  We must know immediately if you are taking blood thinners (like Coumadin/Warfarin)  Or if you are allergic to IV iodine contrast (dye). We must know if you could possible be pregnant.  Possible side-effects: Bleeding from needle site Infection (rare, may require surgery) Nerve injury (rare) Numbness & tingling (temporary) Difficulty urinating (rare, temporary) Spinal headache ( a headache worse with upright posture) Light -headedness (temporary) Pain at injection site (several days) Decreased blood pressure (temporary) Weakness in arm/leg (temporary) Pressure sensation in back/neck (temporary)  Call if you experience: Fever/chills associated with headache or increased back/neck pain. Headache worsened by an upright position. New onset weakness or numbness of an extremity below the injection site Hives or difficulty breathing (go to the emergency room) Inflammation or drainage at the infection site Severe back/neck pain Any new symptoms which are concerning to you  Please note:  Although the local anesthetic injected can often make your back or neck feel good for several hours after the injection, the pain will likely return.  It takes 3-7 days for steroids to work in the epidural space.  You may not notice any pain relief for at least that  one week.  If effective, we will often do a series of three injections spaced 3-6 weeks apart to maximally decrease your pain.  After the initial series, we generally will wait several months before considering a repeat injection of the same type.  If you have any questions,  please call [phone] [facility]

## 2023-12-06 ENCOUNTER — Ambulatory Visit: Payer: Self-pay | Admitting: Internal Medicine

## 2023-12-07 LAB — COMPLIANCE DRUG ANALYSIS, UR

## 2023-12-08 ENCOUNTER — Encounter: Payer: Self-pay | Admitting: Internal Medicine

## 2023-12-08 ENCOUNTER — Telehealth: Payer: Self-pay | Admitting: Neurosurgery

## 2023-12-08 DIAGNOSIS — G8929 Other chronic pain: Secondary | ICD-10-CM | POA: Diagnosis not present

## 2023-12-08 DIAGNOSIS — M5442 Lumbago with sciatica, left side: Secondary | ICD-10-CM | POA: Diagnosis not present

## 2023-12-08 DIAGNOSIS — Z760 Encounter for issue of repeat prescription: Secondary | ICD-10-CM | POA: Diagnosis not present

## 2023-12-08 DIAGNOSIS — M5441 Lumbago with sciatica, right side: Secondary | ICD-10-CM | POA: Diagnosis not present

## 2023-12-08 DIAGNOSIS — M5412 Radiculopathy, cervical region: Secondary | ICD-10-CM | POA: Diagnosis not present

## 2023-12-08 DIAGNOSIS — Z79891 Long term (current) use of opiate analgesic: Secondary | ICD-10-CM | POA: Diagnosis not present

## 2023-12-08 NOTE — Telephone Encounter (Signed)
 Recommend she get further refills of tessalon  perles from her PCP. I sent it back denied.   Please let her know.

## 2023-12-10 ENCOUNTER — Other Ambulatory Visit: Payer: Self-pay | Admitting: Family Medicine

## 2023-12-10 DIAGNOSIS — F333 Major depressive disorder, recurrent, severe with psychotic symptoms: Secondary | ICD-10-CM

## 2023-12-12 ENCOUNTER — Ambulatory Visit: Admitting: Physician Assistant

## 2023-12-13 ENCOUNTER — Encounter: Payer: Self-pay | Admitting: Internal Medicine

## 2023-12-13 DIAGNOSIS — J42 Unspecified chronic bronchitis: Secondary | ICD-10-CM

## 2023-12-14 ENCOUNTER — Telehealth: Payer: Self-pay

## 2023-12-14 DIAGNOSIS — M47816 Spondylosis without myelopathy or radiculopathy, lumbar region: Secondary | ICD-10-CM

## 2023-12-14 MED ORDER — ALBUTEROL SULFATE HFA 108 (90 BASE) MCG/ACT IN AERS
1.0000 | INHALATION_SPRAY | Freq: Four times a day (QID) | RESPIRATORY_TRACT | 1 refills | Status: DC | PRN
Start: 2023-12-14 — End: 2024-03-12

## 2023-12-14 NOTE — Telephone Encounter (Signed)
 Insurance is pending denial for the cesi due to no PT. I spoke with the patients husband and she is willing to go to Randine Muss at [facility] if you will put in the referral.

## 2023-12-14 NOTE — Telephone Encounter (Signed)
 Will you refill tessalon  perles

## 2023-12-15 ENCOUNTER — Ambulatory Visit
Admission: RE | Admit: 2023-12-15 | Discharge: 2023-12-15 | Disposition: A | Discharge status: Home / Self Care | Attending: Internal Medicine | Admitting: Internal Medicine

## 2023-12-15 ENCOUNTER — Ambulatory Visit
Admission: RE | Admit: 2023-12-15 | Discharge: 2023-12-15 | Disposition: A | Discharge status: Home / Self Care | Source: Ambulatory Visit | Attending: Internal Medicine | Admitting: Internal Medicine

## 2023-12-15 ENCOUNTER — Other Ambulatory Visit: Payer: Self-pay | Admitting: Internal Medicine

## 2023-12-15 ENCOUNTER — Ambulatory Visit: Admitting: Urology

## 2023-12-15 ENCOUNTER — Encounter: Payer: Self-pay | Admitting: Internal Medicine

## 2023-12-15 DIAGNOSIS — F5105 Insomnia due to other mental disorder: Secondary | ICD-10-CM | POA: Diagnosis not present

## 2023-12-15 DIAGNOSIS — R0602 Shortness of breath: Secondary | ICD-10-CM | POA: Insufficient documentation

## 2023-12-15 DIAGNOSIS — R053 Chronic cough: Secondary | ICD-10-CM

## 2023-12-15 DIAGNOSIS — J90 Pleural effusion, not elsewhere classified: Secondary | ICD-10-CM | POA: Diagnosis not present

## 2023-12-15 DIAGNOSIS — F39 Unspecified mood [affective] disorder: Secondary | ICD-10-CM | POA: Diagnosis not present

## 2023-12-15 DIAGNOSIS — R918 Other nonspecific abnormal finding of lung field: Secondary | ICD-10-CM | POA: Insufficient documentation

## 2023-12-15 DIAGNOSIS — F410 Panic disorder [episodic paroxysmal anxiety] without agoraphobia: Secondary | ICD-10-CM | POA: Diagnosis not present

## 2023-12-15 DIAGNOSIS — F29 Unspecified psychosis not due to a substance or known physiological condition: Secondary | ICD-10-CM | POA: Diagnosis not present

## 2023-12-15 DIAGNOSIS — F4312 Post-traumatic stress disorder, chronic: Secondary | ICD-10-CM | POA: Diagnosis not present

## 2023-12-15 DIAGNOSIS — F411 Generalized anxiety disorder: Secondary | ICD-10-CM | POA: Diagnosis not present

## 2023-12-15 MED ORDER — BENZONATATE 100 MG PO CAPS
100.0000 mg | ORAL_CAPSULE | Freq: Four times a day (QID) | ORAL | 0 refills | Status: DC | PRN
Start: 2023-12-15 — End: 2024-02-08

## 2023-12-15 MED ORDER — TRELEGY ELLIPTA 100-62.5-25 MCG/ACT IN AEPB: 1.0000 | INHALATION_SPRAY | Freq: Every day | RESPIRATORY_TRACT | 11 refills | Status: DC

## 2023-12-19 NOTE — Progress Notes (Signed)
 12/20/2023 3:07 PM   Victoria [name] Hum [id number] [id number]  Referring provider: Bernardo Fend, DO [address] [place],  [place] [address]  Urological history: 1. Urinary retention - post op (09/2023)   Chief Complaint  Patient presents with   Urinary Retention   HPI: Victoria [name] is a 63 y.o. woman who presents today for one month follow up.  Previous records reviewed.   She had an elective L4/5 fusion with neurosurgery the 19th and experienced postoperative retention.  She passed her voiding trial in June.  For the last 7 years she has been having both stress and urge incontinence.  She wears pads when she is going out.  She has also had urinary frequency going 7-8 times in a few hours and then other times she can go 4 hours without the need to void.  She has nocturia x 3.  She also has external vaginal burning.  She also feels that sometimes when she has the urge to void, when she sits on the toilet she cannot relax enough to empty.  She also has a yellowish vaginal discharge intermittently.  She also has dyspareunia which she attributes to dryness and not being able to relax the vagina during intercourse.  Patient denies any modifying or aggravating factors.  Patient denies any recent UTI's, gross hematuria, dysuria or suprapubic/flank pain.  Patient denies any fevers or chills.    PVR 11 mL    Lab Results  Component Value Date   CREATININE 0.93 11/29/2023   Lab Results  Component Value Date   HGBA1C 6.2 (H) 11/29/2023    PMH: Past Medical History:  Diagnosis Date   Asthma    Cervical spinal stenosis    Complication of anesthesia    Dyspnea    Dysrhythmia    Fatty liver    GERD (gastroesophageal reflux disease)    Hypercholesterolemia    Hypertension    Lumbar stenosis    Lyme disease    Melanocarcinoma (HCC)    Osteoarthritis of both hips    Pre-diabetes    Psychotic disorder (HCC)    Pulmonary fibrosis (HCC)    Sleep  apnea     Surgical History: Past Surgical History:  Procedure Laterality Date   ABDOMINAL HYSTERECTOMY     APPENDECTOMY  2006   APPLICATION OF INTRAOPERATIVE CT SCAN N/A 10/17/2023   Procedure: APPLICATION OF INTRAOPERATIVE CT SCAN;  Surgeon: Clois Fret, MD;  Location: [facility];  Service: Neurosurgery;  Laterality: N/A;   BREAST EXCISIONAL BIOPSY Right 1997   neg   ESOPHAGOGASTRODUODENOSCOPY (EGD) WITH PROPOFOL  N/A 09/20/2022   Procedure: ESOPHAGOGASTRODUODENOSCOPY (EGD) WITH PROPOFOL ;  Surgeon: Therisa Bi, MD;  Location: [facility];  Service: Gastroenterology;  Laterality: N/A;   excision of melanoma on Right arm     HIATAL HERNIA REPAIR     Jan 2017   LAPAROSCOPY  2020   TRANSFORAMINAL LUMBAR INTERBODY FUSION W/ MIS 1 LEVEL N/A 10/17/2023   Procedure: L4-5 MINIMALLY INVASIVE (MIS) TRANSFORAMINAL LUMBAR INTERBODY FUSION (TLIF);  Surgeon: Clois Fret, MD;  Location: [facility];  Service: Neurosurgery;  Laterality: N/A;  L4-5 MINIMALLY INVASIVE (MIS) TRANSFORAMINAL LUMBAR INTERBODY FUSION (TLIF)    Home Medications:  Allergies as of 12/20/2023       Reactions   Acetaminophen  Other (See Comments)   Liver issues   Atorvastatin    Myalgias    Sertraline Hives, Shortness Of Breath   Cefuroxime Hives   Has tolerated 1st generation cephalosporin (CEPHALEXIN) in the  past with no documented ADRs.    Codeine Hives   Tape    Adhesive tape    Amoxicillin Rash   Penicillins Rash   Has tolerated 1st generation cephalosporin (CEPHALEXIN) in the past with no documented ADRs.    Sulfa Antibiotics Rash        Medication List        Accurate as of December 20, 2023  3:07 PM. If you have any questions, ask your nurse or doctor.          albuterol  108 (90 Base) MCG/ACT inhaler Commonly known as: VENTOLIN  HFA Inhale 1-2 puffs into the lungs every 6 (six) hours as needed for wheezing or shortness of breath.   artificial tears ophthalmic solution Place 1 drop into both  eyes 3 (three) times daily.   B-COMPLEX/B-12 PO Take by mouth.   benzonatate  100 MG capsule Commonly known as: Tessalon  Perles Take 1 capsule (100 mg total) by mouth every 6 (six) hours as needed for cough.   DULoxetine  30 MG capsule Commonly known as: CYMBALTA  Take 3 capsules (90 mg total) by mouth daily.   estradiol  0.1 MG/GM vaginal cream Commonly known as: ESTRACE  VAGINAL Apply 0.5mg  (pea-sized amount)  just inside the vaginal introitus with a finger-tip on Monday, Wednesday and Friday nights.   gabapentin  300 MG capsule Commonly known as: NEURONTIN  Take 3 capsules (900 mg total) by mouth 3 (three) times daily. What changed: how much to take   ipratropium-albuterol  0.5-2.5 (3) MG/3ML Soln Commonly known as: DUONEB Take 3 mLs by nebulization 3 (three) times daily as needed (cough wheeze SOB bronchitis/asthma symptoms).   ketoconazole  2 % cream Commonly known as: NIZORAL  Apply 1 Application topically daily.   losartan  25 MG tablet Commonly known as: COZAAR  Take 1 tablet (25 mg total) by mouth daily.   miconazole kit Commonly known as: MONISTAT 1 COMBINATION PACK Place 1 each vaginally once.   Movantik  25 MG Tabs tablet Generic drug: naloxegol  oxalate Take 1 tablet (25 mg total) by mouth daily as needed (Constipation).   multivitamin with minerals tablet Take 1 tablet by mouth daily.   ondansetron  4 MG disintegrating tablet Commonly known as: ZOFRAN -ODT Take 1 tablet (4 mg total) by mouth every 8 (eight) hours as needed for nausea or vomiting.   ondansetron  8 MG tablet Commonly known as: ZOFRAN  Take 1 tablet (8 mg total) by mouth every 8 (eight) hours as needed for nausea or vomiting.   Oxycodone  HCl 10 MG Tabs Take 1 tablet (10 mg total) by mouth every 6 (six) hours as needed (severe pain).   pantoprazole  40 MG tablet Commonly known as: PROTONIX  TAKE 1 TABLET BY MOUTH EVERY DAY IN THE MORNING   QUEtiapine  100 MG tablet Commonly known as: SEROquel  Take 1  tablet (100 mg total) by mouth 2 (two) times daily.   SUMAtriptan  100 MG tablet Commonly known as: IMITREX  Take 1 tablet (100 mg total) by mouth once as needed for migraine. May repeat in 2 hours if headache persists or recurs.   traZODone  50 MG tablet Commonly known as: DESYREL  Take 1 tablet (50 mg total) by mouth at bedtime.   Trelegy Ellipta  100-62.5-25 MCG/ACT Aepb Generic drug: Fluticasone -Umeclidin-Vilant Inhale 1 puff into the lungs daily.   vitamin D3 25 MCG tablet Commonly known as: CHOLECALCIFEROL  Take 1 tablet (1,000 Units total) by mouth daily.        Allergies:  Allergies  Allergen Reactions   Acetaminophen  Other (See Comments)    Liver issues  Atorvastatin     Myalgias    Sertraline Hives and Shortness Of Breath   Cefuroxime Hives    Has tolerated 1st generation cephalosporin (CEPHALEXIN) in the past with no documented ADRs.    Codeine Hives   Tape     Adhesive tape    Amoxicillin Rash   Penicillins Rash    Has tolerated 1st generation cephalosporin (CEPHALEXIN) in the past with no documented ADRs.    Sulfa Antibiotics Rash    Family History: Family History  Problem Relation Age of Onset   Alcohol  abuse Mother    Arthritis Mother    Heart disease Mother    Liver disease Mother    Osteoporosis Mother    Clotting disorder Mother    Diabetes Father    Arthritis Father    Stroke Father    Sarcoidosis Father    Heart failure Father    Bipolar disorder Sister    Schizophrenia Sister    Depression Sister    Anxiety disorder Sister    Alcohol  abuse Sister    Diabetes Sister    Arthritis Sister    Thyroid  disease Sister    Lupus Sister    Depression Sister    Anxiety disorder Sister    Alcohol  abuse Sister    Depression Sister    Anxiety disorder Sister    Ehlers-Danlos syndrome Daughter     Social History: See HPI for pertinent social history  ROS: Pertinent ROS in HPI  Physical Exam: BP (!) 149/82   Pulse 84   Ht 5' 4 (1.626 m)    Wt 198 lb (89.8 kg)   BMI 33.99 kg/m   Constitutional:  Well nourished. Alert and oriented, No acute distress. HEENT: [place] AT, moist mucus membranes.  Trachea midline Cardiovascular: No clubbing, cyanosis, or edema. Respiratory: Normal respiratory effort, no increased work of breathing. Neurologic: Grossly intact, no focal deficits, moving all 4 extremities. Psychiatric: Normal mood and affect.    Laboratory Data: See EPIC and HPI  I have reviewed the labs.   Pertinent Imaging:  12/20/23 14:42  Scan Result 11ml   Assessment & Plan:    1. Urinary retention - PVR demonstrates adequate bladder emptying  2.  Mixed urinary incontinence - We discussed that the stress portion of the incontinence cannot be addressed with medication and she would likely benefit from pelvic floor physical therapy, she is in agreement with that does not want to start an OAB medication at this time  3.  Vaginal atrophy - With her symptoms of vaginal dryness and painful intercourse I will go ahead and start vaginal estrogen cream 3 nights weekly and encouraged her to reach out to her PCP for further evaluation of her gynecological issues or asked for referral to gynecology  4. OAB/pelvic floor dysfunction - She is not interested in any OAB medications at this time - We will go ahead and start the vaginal estrogen cream 3 nights weekly to see if this will also improve her OAB - She is also interested in pelvic floor physical therapy which will be beneficial for OAB as well  Return in about 3 months (around 03/21/2024) for symptom recheck .  These notes generated with voice recognition software. I apologize for typographical errors.  Victoria [name]  [facility] [address]  [address] [place], [place] [address] [phone]

## 2023-12-20 ENCOUNTER — Encounter: Payer: Self-pay | Admitting: Urology

## 2023-12-20 ENCOUNTER — Ambulatory Visit: Admitting: Urology

## 2023-12-20 VITALS — BP 149/82 | HR 84 | Ht 64.0 in | Wt 198.0 lb

## 2023-12-20 DIAGNOSIS — M6289 Other specified disorders of muscle: Secondary | ICD-10-CM

## 2023-12-20 DIAGNOSIS — R339 Retention of urine, unspecified: Secondary | ICD-10-CM

## 2023-12-20 DIAGNOSIS — N952 Postmenopausal atrophic vaginitis: Secondary | ICD-10-CM | POA: Diagnosis not present

## 2023-12-20 DIAGNOSIS — N3281 Overactive bladder: Secondary | ICD-10-CM | POA: Diagnosis not present

## 2023-12-20 LAB — BLADDER SCAN AMB NON-IMAGING

## 2023-12-20 MED ORDER — ESTRADIOL 0.1 MG/GM VA CREA: TOPICAL_CREAM | VAGINAL | 12 refills | Status: DC

## 2023-12-24 LAB — LAB REPORT - SCANNED: EGFR: 69

## 2023-12-25 ENCOUNTER — Other Ambulatory Visit: Payer: Self-pay | Admitting: Orthopedic Surgery

## 2023-12-25 DIAGNOSIS — M4316 Spondylolisthesis, lumbar region: Secondary | ICD-10-CM

## 2023-12-25 DIAGNOSIS — Z981 Arthrodesis status: Secondary | ICD-10-CM

## 2023-12-25 NOTE — Progress Notes (Signed)
   REFERRING PHYSICIAN:  Bernardo Sharyle Has [address] [place],  [place] [address]  DOS: 10/17/23 MIS TLIF L4-L5 with PSF  HISTORY OF PRESENT ILLNESS:  She is doing pretty well.  Her neuropathy has improved.  Her leg pain has improved.  Her back pain is improved.  She continues to have neck and arm discomfort.  Her pain is worse on the right than the left but extends down her arm into the first 4 digits of the right hand.  She is starting physical therapy on Thursday for her neck.    PHYSICAL EXAMINATION:  General: Patient is well developed, well nourished, calm, collected, and in no apparent distress.   NEUROLOGICAL:  General: In no acute distress.   Awake, alert, oriented to person, place, and time.  Pupils equal round and reactive to light.  Facial tone is symmetric.     Strength:          Side Iliopsoas Quads Hamstring PF DF EHL  R 5 5 5 5 5 5   L 5 5 5 5 5 5    Incisions well healed.   ROS (Neurologic):  Negative except as noted above  IMAGING: Lumbar xrays dated today are stable:  No complications noted.   ASSESSMENT/PLAN:  Victoria [name] is doing well s/p above surgery.  We reviewed her activity limitations.  That she is off restrictions, she will continue to refrain from riding horses until she feels comfortable.  She is having difficulty with her neck which seems concerning for cervical radiculopathy.  She was last evaluated with an MRI 2 years ago and found to have significant foraminal stenosis.  She starts physical therapy Thursday.  Will touch base after 6 weeks of therapy to see how she is doing.  If she continues to have symptoms at that time, we will obtain an MRI scan of the cervical spine and then evaluate for possible interventions.   Reeves Daisy MD Department of neurosurgery

## 2023-12-27 ENCOUNTER — Ambulatory Visit: Admitting: Internal Medicine

## 2023-12-30 DIAGNOSIS — F29 Unspecified psychosis not due to a substance or known physiological condition: Secondary | ICD-10-CM | POA: Diagnosis not present

## 2023-12-30 DIAGNOSIS — F410 Panic disorder [episodic paroxysmal anxiety] without agoraphobia: Secondary | ICD-10-CM | POA: Diagnosis not present

## 2023-12-30 DIAGNOSIS — F5105 Insomnia due to other mental disorder: Secondary | ICD-10-CM | POA: Diagnosis not present

## 2023-12-30 DIAGNOSIS — F411 Generalized anxiety disorder: Secondary | ICD-10-CM | POA: Diagnosis not present

## 2023-12-30 DIAGNOSIS — F39 Unspecified mood [affective] disorder: Secondary | ICD-10-CM | POA: Diagnosis not present

## 2023-12-30 DIAGNOSIS — F4312 Post-traumatic stress disorder, chronic: Secondary | ICD-10-CM | POA: Diagnosis not present

## 2024-01-03 ENCOUNTER — Ambulatory Visit
Admission: RE | Admit: 2024-01-03 | Discharge: 2024-01-03 | Disposition: A | Discharge status: Home / Self Care | Attending: Orthopedic Surgery | Admitting: Orthopedic Surgery

## 2024-01-03 ENCOUNTER — Ambulatory Visit
Admission: RE | Admit: 2024-01-03 | Discharge: 2024-01-03 | Disposition: A | Discharge status: Home / Self Care | Source: Ambulatory Visit | Attending: Orthopedic Surgery | Admitting: Orthopedic Surgery

## 2024-01-03 ENCOUNTER — Ambulatory Visit (INDEPENDENT_AMBULATORY_CARE_PROVIDER_SITE_OTHER): Admitting: Neurosurgery

## 2024-01-03 ENCOUNTER — Encounter: Payer: Self-pay | Admitting: Neurosurgery

## 2024-01-03 VITALS — BP 150/86 | Temp 98.9°F | Ht 64.0 in | Wt 198.0 lb

## 2024-01-03 DIAGNOSIS — Z981 Arthrodesis status: Secondary | ICD-10-CM

## 2024-01-03 DIAGNOSIS — M4316 Spondylolisthesis, lumbar region: Secondary | ICD-10-CM

## 2024-01-03 DIAGNOSIS — M5412 Radiculopathy, cervical region: Secondary | ICD-10-CM

## 2024-01-09 DIAGNOSIS — M542 Cervicalgia: Secondary | ICD-10-CM | POA: Diagnosis not present

## 2024-01-09 DIAGNOSIS — M6281 Muscle weakness (generalized): Secondary | ICD-10-CM | POA: Diagnosis not present

## 2024-01-10 ENCOUNTER — Encounter: Payer: Self-pay | Admitting: Internal Medicine

## 2024-01-10 ENCOUNTER — Other Ambulatory Visit: Payer: Self-pay

## 2024-01-10 ENCOUNTER — Other Ambulatory Visit: Payer: Self-pay | Admitting: Internal Medicine

## 2024-01-10 ENCOUNTER — Ambulatory Visit: Attending: Urology

## 2024-01-10 DIAGNOSIS — R2689 Other abnormalities of gait and mobility: Secondary | ICD-10-CM | POA: Diagnosis not present

## 2024-01-10 DIAGNOSIS — M6281 Muscle weakness (generalized): Secondary | ICD-10-CM | POA: Insufficient documentation

## 2024-01-10 DIAGNOSIS — R110 Nausea: Secondary | ICD-10-CM

## 2024-01-10 DIAGNOSIS — N3946 Mixed incontinence: Secondary | ICD-10-CM | POA: Diagnosis not present

## 2024-01-10 DIAGNOSIS — M6289 Other specified disorders of muscle: Secondary | ICD-10-CM | POA: Insufficient documentation

## 2024-01-10 DIAGNOSIS — R293 Abnormal posture: Secondary | ICD-10-CM | POA: Insufficient documentation

## 2024-01-10 MED ORDER — ONDANSETRON 4 MG PO TBDP: 4.0000 mg | ORAL_TABLET | Freq: Three times a day (TID) | ORAL | 0 refills | Status: DC | PRN

## 2024-01-10 NOTE — Patient Instructions (Signed)
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
## 2024-01-13 DIAGNOSIS — F333 Major depressive disorder, recurrent, severe with psychotic symptoms: Secondary | ICD-10-CM | POA: Diagnosis not present

## 2024-01-16 DIAGNOSIS — F411 Generalized anxiety disorder: Secondary | ICD-10-CM | POA: Diagnosis not present

## 2024-01-16 DIAGNOSIS — F29 Unspecified psychosis not due to a substance or known physiological condition: Secondary | ICD-10-CM | POA: Diagnosis not present

## 2024-01-16 DIAGNOSIS — F5105 Insomnia due to other mental disorder: Secondary | ICD-10-CM | POA: Diagnosis not present

## 2024-01-16 DIAGNOSIS — F4312 Post-traumatic stress disorder, chronic: Secondary | ICD-10-CM | POA: Diagnosis not present

## 2024-01-16 DIAGNOSIS — F410 Panic disorder [episodic paroxysmal anxiety] without agoraphobia: Secondary | ICD-10-CM | POA: Diagnosis not present

## 2024-01-16 DIAGNOSIS — F39 Unspecified mood [affective] disorder: Secondary | ICD-10-CM | POA: Diagnosis not present

## 2024-01-17 ENCOUNTER — Encounter

## 2024-01-17 DIAGNOSIS — M6281 Muscle weakness (generalized): Secondary | ICD-10-CM | POA: Diagnosis not present

## 2024-01-17 DIAGNOSIS — M5416 Radiculopathy, lumbar region: Secondary | ICD-10-CM | POA: Diagnosis not present

## 2024-01-17 DIAGNOSIS — M542 Cervicalgia: Secondary | ICD-10-CM | POA: Diagnosis not present

## 2024-01-19 ENCOUNTER — Ambulatory Visit: Payer: Medicare HMO | Admitting: Dermatology

## 2024-01-19 ENCOUNTER — Telehealth: Payer: Self-pay

## 2024-01-19 ENCOUNTER — Encounter: Payer: Self-pay | Admitting: Dermatology

## 2024-01-19 DIAGNOSIS — L820 Inflamed seborrheic keratosis: Secondary | ICD-10-CM

## 2024-01-19 DIAGNOSIS — L578 Other skin changes due to chronic exposure to nonionizing radiation: Secondary | ICD-10-CM | POA: Diagnosis not present

## 2024-01-19 DIAGNOSIS — Z1283 Encounter for screening for malignant neoplasm of skin: Secondary | ICD-10-CM

## 2024-01-19 DIAGNOSIS — Z7189 Other specified counseling: Secondary | ICD-10-CM

## 2024-01-19 DIAGNOSIS — Z85820 Personal history of malignant melanoma of skin: Secondary | ICD-10-CM

## 2024-01-19 DIAGNOSIS — W908XXA Exposure to other nonionizing radiation, initial encounter: Secondary | ICD-10-CM | POA: Diagnosis not present

## 2024-01-19 DIAGNOSIS — D1801 Hemangioma of skin and subcutaneous tissue: Secondary | ICD-10-CM | POA: Diagnosis not present

## 2024-01-19 DIAGNOSIS — L570 Actinic keratosis: Secondary | ICD-10-CM | POA: Diagnosis not present

## 2024-01-19 DIAGNOSIS — L219 Seborrheic dermatitis, unspecified: Secondary | ICD-10-CM

## 2024-01-19 DIAGNOSIS — Z808 Family history of malignant neoplasm of other organs or systems: Secondary | ICD-10-CM

## 2024-01-19 DIAGNOSIS — Z79899 Other long term (current) drug therapy: Secondary | ICD-10-CM | POA: Diagnosis not present

## 2024-01-19 MED ORDER — HYDROCORTISONE 2.5 % EX CREA: TOPICAL_CREAM | CUTANEOUS | 11 refills | Status: AC

## 2024-01-19 MED ORDER — KETOCONAZOLE 2 % EX SHAM: MEDICATED_SHAMPOO | CUTANEOUS | 11 refills | Status: AC

## 2024-01-19 NOTE — Telephone Encounter (Signed)
 Medical records from [facility] in media. aw

## 2024-01-19 NOTE — Progress Notes (Signed)
 New Patient Visit   Subjective  Victoria [name] is a 63 y.o. female who presents for the following: Skin Cancer Screening and Full Body Skin Exam Hx of skin cancer melanoma at right arm 2019 treated with Dr Towana  Reports family history of skin cancer   The patient presents for Total-Body Skin Exam (TBSE) for skin cancer screening and mole check. The patient has spots, moles and lesions to be evaluated, some may be new or changing and the patient may have concern these could be cancer.  The following portions of the chart were reviewed this encounter and updated as appropriate: medications, allergies, medical history  Review of Systems:  No other skin or systemic complaints except as noted in HPI or Assessment and Plan.  Objective  Well appearing patient in no apparent distress; mood and affect are within normal limits.  A full examination was performed including scalp, head, eyes, ears, nose, lips, neck, chest, axillae, abdomen, back, buttocks, bilateral upper extremities, bilateral lower extremities, hands, feet, fingers, toes, fingernails, and toenails. All findings within normal limits unless otherwise noted below.   Relevant physical exam findings are noted in the Assessment and Plan.  face x 20 (20) Erythematous thin papules/macules with gritty scale.   right chest  sternum x 1, right anterior leg x 1, left medial knee x 3, right shoulder x 5, right elbow x 1 , left upper arm and shoulder x 10 (21) Erythematous stuck-on, waxy papule or plaque  Assessment & Plan   SEBORRHEIC DERMATITIS Exam: Pink patches with greasy scale at behind ears and scalp Chronic and persistent condition with duration or expected duration over one year. Condition is bothersome/symptomatic for patient. Currently flared. Seborrheic Dermatitis is a chronic persistent rash characterized by pinkness and scaling most commonly of the mid face but also can occur on the scalp (dandruff), ears; mid chest,  mid back and groin.  It tends to be exacerbated by stress and cooler weather.  People who have neurologic disease may experience new onset or exacerbation of existing seborrheic dermatitis.  The condition is not curable but treatable and can be controlled. Treatment Plan: Start ketoconazole  2 % shampoo - apply two times per week, massage into scalp and behind ears and leave in for 5 to 10 minutes before rinsing out Start hydrocortisone  2.5 % cream - apply topically behind ears on M-W-F at bedtime for seborrheic dermatitis    SKIN CANCER SCREENING PERFORMED TODAY.  ACTINIC DAMAGE - Chronic condition, secondary to cumulative UV/sun exposure - diffuse scaly erythematous macules with underlying dyspigmentation - Recommend daily broad spectrum sunscreen SPF 30+ to sun-exposed areas, reapply every 2 hours as needed.  - Staying in the shade or wearing long sleeves, sun glasses (UVA+UVB protection) and wide brim hats (4-inch brim around the entire circumference of the hat) are also recommended for sun protection.  - Call for new or changing lesions.  LENTIGINES, SEBORRHEIC KERATOSES, HEMANGIOMAS - Benign normal skin lesions - Benign-appearing - Call for any changes  MELANOCYTIC NEVI - Tan-brown and/or pink-flesh-colored symmetric macules and papules - Benign appearing on exam today - Observation - Call clinic for new or changing moles - Recommend daily use of broad spectrum spf 30+ sunscreen to sun-exposed areas.   HISTORY OF MELANOMA 2019 in melanoma on right arm treated by Dr. Towana in [facility] pending records from Dr. Towana office - records requested today. - No evidence of recurrence today - No lymphadenopathy - Recommend regular full body skin exams - Recommend  daily broad spectrum sunscreen SPF 30+ to sun-exposed areas, reapply every 2 hours as needed.  - Call if any new or changing lesions are noted between office visits  FAMILY HISTORY OF SKIN CANCER What type(s):scc    ACTINIC KERATOSIS (20) face x 20 (20) Actinic keratoses are precancerous spots that appear secondary to cumulative UV radiation exposure/sun exposure over time. They are chronic with expected duration over 1 year. A portion of actinic keratoses will progress to squamous cell carcinoma of the skin. It is not possible to reliably predict which spots will progress to skin cancer and so treatment is recommended to prevent development of skin cancer.  Recommend daily broad spectrum sunscreen SPF 30+ to sun-exposed areas, reapply every 2 hours as needed.  Recommend staying in the shade or wearing long sleeves, sun glasses (UVA+UVB protection) and wide brim hats (4-inch brim around the entire circumference of the hat). Call for new or changing lesions. Destruction of lesion - face x 20 (20) Complexity: simple   Destruction method: cryotherapy   Informed consent: discussed and consent obtained   Timeout:  patient name, date of birth, surgical site, and procedure verified Lesion destroyed using liquid nitrogen: Yes   Region frozen until ice ball extended beyond lesion: Yes   Outcome: patient tolerated procedure well with no complications   Post-procedure details: wound care instructions given    INFLAMED SEBORRHEIC KERATOSIS (21) right chest  sternum x 1, right anterior leg x 1, left medial knee x 3, right shoulder x 5, right elbow x 1 , left upper arm and shoulder x 10 (21) Symptomatic, irritating, patient would like treated.  Will recheck chest right sternum in future if not resolved will bx  Destruction of lesion - right chest  sternum x 1, right anterior leg x 1, left medial knee x 3, right shoulder x 5, right elbow x 1 , left upper arm and shoulder x 10 (21) Complexity: simple   Destruction method: cryotherapy   Informed consent: discussed and consent obtained   Timeout:  patient name, date of birth, surgical site, and procedure verified Lesion destroyed using liquid nitrogen: Yes    Region frozen until ice ball extended beyond lesion: Yes   Outcome: patient tolerated procedure well with no complications   Post-procedure details: wound care instructions given    SEBORRHEIC DERMATITIS   Related Medications ketoconazole  (NIZORAL ) 2 % shampoo apply 2 times per week, massage into behind ears and scalp and leave in for 5 -  10 minutes before rinsing out for seborrheic dermatitis hydrocortisone  2.5 % cream Apply topically behind ears on m-w-f at bedtime for seborrheic dermatitis  Return in about 3 months (around 04/20/2024) for ak and isk follow up , 1 year tbse .  IEleanor Blush, CMA, am acting as scribe for Alm Rhyme, MD.   Documentation: I have reviewed the above documentation for accuracy and completeness, and I agree with the above.  Alm Rhyme, MD

## 2024-01-19 NOTE — Patient Instructions (Addendum)
 Seborrheic Keratosis  What causes seborrheic keratoses? Seborrheic keratoses are harmless, common skin growths that first appear during adult life.  As time goes by, more growths appear.  Some people may develop a large number of them.  Seborrheic keratoses appear on both covered and uncovered body parts.  They are not caused by sunlight.  The tendency to develop seborrheic keratoses can be inherited.  They vary in color from skin-colored to gray, brown, or even black.  They can be either smooth or have a rough, warty surface.   Seborrheic keratoses are superficial and look as if they were stuck on the skin.  Under the microscope this type of keratosis looks like layers upon layers of skin.  That is why at times the top layer may seem to fall off, but the rest of the growth remains and re-grows.    Treatment Seborrheic keratoses do not need to be treated, but can easily be removed in the office.  Seborrheic keratoses often cause symptoms when they rub on clothing or jewelry.  Lesions can be in the way of shaving.  If they become inflamed, they can cause itching, soreness, or burning.  Removal of a seborrheic keratosis can be accomplished by freezing, burning, or surgery. If any spot bleeds, scabs, or grows rapidly, please return to have it checked, as these can be an indication of a skin cancer.   Cryotherapy Aftercare  Wash gently with soap and water everyday.   Apply Vaseline and Band-Aid daily until healed.    Actinic keratoses are precancerous spots that appear secondary to cumulative UV radiation exposure/sun exposure over time. They are chronic with expected duration over 1 year. A portion of actinic keratoses will progress to squamous cell carcinoma of the skin. It is not possible to reliably predict which spots will progress to skin cancer and so treatment is recommended to prevent development of skin cancer.  Recommend daily broad spectrum sunscreen SPF 30+ to sun-exposed areas, reapply  every 2 hours as needed.  Recommend staying in the shade or wearing long sleeves, sun glasses (UVA+UVB protection) and wide brim hats (4-inch brim around the entire circumference of the hat). Call for new or changing lesions.     Melanoma ABCDEs  Melanoma is the most dangerous type of skin cancer, and is the leading cause of death from skin disease.  You are more likely to develop melanoma if you: Have light-colored skin, light-colored eyes, or red or blond hair Spend a lot of time in the sun Tan regularly, either outdoors or in a tanning bed Have had blistering sunburns, especially during childhood Have a close family member who has had a melanoma Have atypical moles or large birthmarks  Early detection of melanoma is key since treatment is typically straightforward and cure rates are extremely high if we catch it early.   The first sign of melanoma is often a change in a mole or a new dark spot.  The ABCDE system is a way of remembering the signs of melanoma.  A for asymmetry:  The two halves do not match. B for border:  The edges of the growth are irregular. C for color:  A mixture of colors are present instead of an even brown color. D for diameter:  Melanomas are usually (but not always) greater than 6mm - the size of a pencil eraser. E for evolution:  The spot keeps changing in size, shape, and color.  Please check your skin once per month between visits. You can  use a small mirror in front and a large mirror behind you to keep an eye on the back side or your body.   If you see any new or changing lesions before your next follow-up, please call to schedule a visit.  Please continue daily skin protection including broad spectrum sunscreen SPF 30+ to sun-exposed areas, reapplying every 2 hours as needed when you're outdoors.   Staying in the shade or wearing long sleeves, sun glasses (UVA+UVB protection) and wide brim hats (4-inch brim around the entire circumference of the hat)  are also recommended for sun protection.    Due to recent changes in healthcare laws, you may see results of your pathology and/or laboratory studies on MyChart before the doctors have had a chance to review them. We understand that in some cases there may be results that are confusing or concerning to you. Please understand that not all results are received at the same time and often the doctors may need to interpret multiple results in order to provide you with the best plan of care or course of treatment. Therefore, we ask that you please give us  2 business days to thoroughly review all your results before contacting the office for clarification. Should we see a critical lab result, you will be contacted sooner.   If You Need Anything After Your Visit  If you have any questions or concerns for your doctor, please call our main line at [phone] and press option 4 to reach your doctor's medical assistant. If no one answers, please leave a voicemail as directed and we will return your call as soon as possible. Messages left after 4 pm will be answered the following business day.   You may also send us  a message via MyChart. We typically respond to MyChart messages within 1-2 business days.  For prescription refills, please ask your pharmacy to contact our office. Our fax number is [phone].  If you have an urgent issue when the clinic is closed that cannot wait until the next business day, you can page your doctor at the number below.    Please note that while we do our best to be available for urgent issues outside of office hours, we are not available 24/7.   If you have an urgent issue and are unable to reach us , you may choose to seek medical care at your doctor's office, retail clinic, urgent care center, or emergency room.  If you have a medical emergency, please immediately call 911 or go to the emergency department.  Pager Numbers  - Dr. Hester: [phone]  - Dr. Jackquline:  [phone]  - Dr. Claudene: [phone]   - Dr. Raymund: [phone]  In the event of inclement weather, please call our main line at [phone] for an update on the status of any delays or closures.  Dermatology Medication Tips: Please keep the boxes that topical medications come in in order to help keep track of the instructions about where and how to use these. Pharmacies typically print the medication instructions only on the boxes and not directly on the medication tubes.   If your medication is too expensive, please contact our office at [phone] option 4 or send us  a message through MyChart.   We are unable to tell what your co-pay for medications will be in advance as this is different depending on your insurance coverage. However, we may be able to find a substitute medication at lower cost or fill out paperwork to get insurance to cover  a needed medication.   If a prior authorization is required to get your medication covered by your insurance company, please allow us  1-2 business days to complete this process.  Drug prices often vary depending on where the prescription is filled and some pharmacies may offer cheaper prices.  The website www.goodrx.com contains coupons for medications through different pharmacies. The prices here do not account for what the cost may be with help from insurance (it may be cheaper with your insurance), but the website can give you the price if you did not use any insurance.  - You can print the associated coupon and take it with your prescription to the pharmacy.  - You may also stop by our office during regular business hours and pick up a GoodRx coupon card.  - If you need your prescription sent electronically to a different pharmacy, notify our office through [facility] or by phone at [phone] option 4.     Si Usted Necesita Algo Despus de Su Visita  Tambin puede enviarnos un mensaje a travs de Clinical cytogeneticist. Por lo general  respondemos a los mensajes de MyChart en el transcurso de 1 a 2 das hbiles.  Para renovar recetas, por favor pida a su farmacia que se ponga en contacto con nuestra oficina. Randi lakes de fax es [place] [phone].  Si tiene un asunto urgente cuando la clnica est cerrada y que no puede esperar hasta el siguiente da hbil, puede llamar/localizar a su doctor(a) al nmero que aparece a continuacin.   Por favor, tenga en cuenta que aunque hacemos todo lo posible para estar disponibles para asuntos urgentes fuera del horario de [place], no estamos disponibles las 24 horas del da, los 7 [address]  [address] de la [place].   Si tiene un problema urgente y no puede comunicarse con nosotros, puede optar por buscar atencin mdica  en el consultorio de su doctor(a), en una clnica privada, en un centro de atencin urgente o en una sala de emergencias.  Si tiene Engineer, drilling, por favor llame inmediatamente al 911 o vaya a la sala de emergencias.  Nmeros de bper  - Dr. Hester: [phone]  - Dra. Jackquline: [phone]  - Dr. Claudene: [phone]  - Dra. Kitts: [phone]  En caso de inclemencias del [place], por favor llame a nuestra lnea principal al [phone] para una actualizacin sobre el estado de cualquier retraso o cierre.  Consejos para la medicacin en dermatologa: Por favor, guarde las cajas en las que vienen los medicamentos de uso tpico para ayudarle a seguir las instrucciones sobre dnde y cmo usarlos. Las farmacias generalmente imprimen las instrucciones del medicamento slo en las cajas y no directamente en los tubos del [place].   Si su medicamento es muy caro, por favor, pngase en contacto con landry rieger llamando al [phone] y presione la opcin 4 o envenos un mensaje a travs de Clinical cytogeneticist.   No podemos decirle cul ser su copago por los medicamentos por adelantado ya que esto es diferente dependiendo de la cobertura de su seguro. Sin embargo, es posible  que podamos encontrar un medicamento sustituto a Audiological scientist un formulario para que el seguro cubra el medicamento que se considera necesario.   Si se requiere una autorizacin previa para que su compaa de seguros malta su medicamento, por favor permtanos de 1 a 2 das hbiles para completar este proceso.  Los precios de los medicamentos varan con frecuencia dependiendo del Environmental consultant de dnde se surte la receta y iraq  pueden ofrecer precios ms baratos.  El sitio web www.goodrx.com tiene cupones para medicamentos de Health and safety inspector. Los precios aqu no tienen en cuenta lo que podra costar con la ayuda del seguro (puede ser ms barato con su seguro), pero el sitio web puede darle el precio si no utiliz Tourist information centre manager.  - Puede imprimir el cupn correspondiente y llevarlo con su receta a la farmacia.  - Tambin puede pasar por nuestra oficina durante el horario de atencin regular y Education officer, museum una tarjeta de cupones de GoodRx.  - Si necesita que su receta se enve electrnicamente a una farmacia diferente, informe a nuestra oficina a travs de MyChart de [place] o por telfono llamando al [phone] y presione la opcin 4.

## 2024-01-20 ENCOUNTER — Ambulatory Visit
Admission: RE | Admit: 2024-01-20 | Discharge: 2024-01-20 | Disposition: A | Discharge status: Home / Self Care | Attending: Nurse Practitioner | Admitting: Nurse Practitioner

## 2024-01-20 ENCOUNTER — Encounter: Payer: Self-pay | Admitting: Nurse Practitioner

## 2024-01-20 ENCOUNTER — Ambulatory Visit
Admission: RE | Admit: 2024-01-20 | Discharge: 2024-01-20 | Disposition: A | Discharge status: Home / Self Care | Source: Ambulatory Visit | Attending: Nurse Practitioner | Admitting: Nurse Practitioner

## 2024-01-20 ENCOUNTER — Ambulatory Visit (INDEPENDENT_AMBULATORY_CARE_PROVIDER_SITE_OTHER): Admitting: Nurse Practitioner

## 2024-01-20 ENCOUNTER — Ambulatory Visit: Payer: Self-pay

## 2024-01-20 VITALS — BP 142/82 | HR 87 | Temp 98.2°F | Resp 18 | Ht 64.0 in | Wt 208.3 lb

## 2024-01-20 DIAGNOSIS — J42 Unspecified chronic bronchitis: Secondary | ICD-10-CM | POA: Diagnosis not present

## 2024-01-20 DIAGNOSIS — R0602 Shortness of breath: Secondary | ICD-10-CM | POA: Diagnosis not present

## 2024-01-20 DIAGNOSIS — R059 Cough, unspecified: Secondary | ICD-10-CM | POA: Diagnosis not present

## 2024-01-20 LAB — CBC WITH DIFFERENTIAL/PLATELET
Absolute Lymphocytes: 1951 {cells}/uL (ref 850–3900)
Absolute Monocytes: 547 {cells}/uL (ref 200–950)
Basophils Absolute: 43 {cells}/uL (ref 0–200)
Basophils Relative: 0.6 %
Eosinophils Absolute: 166 {cells}/uL (ref 15–500)
Eosinophils Relative: 2.3 %
HCT: 40.2 % (ref 35.0–45.0)
Hemoglobin: 13.1 g/dL (ref 11.7–15.5)
MCH: 30 pg (ref 27.0–33.0)
MCHC: 32.6 g/dL (ref 32.0–36.0)
MCV: 92.2 fL (ref 80.0–100.0)
MPV: 11.5 fL (ref 7.5–12.5)
Monocytes Relative: 7.6 %
Neutro Abs: 4493 {cells}/uL (ref 1500–7800)
Neutrophils Relative %: 62.4 %
Platelets: 229 Thousand/uL (ref 140–400)
RBC: 4.36 Million/uL (ref 3.80–5.10)
RDW: 13.5 % (ref 11.0–15.0)
Total Lymphocyte: 27.1 %
WBC: 7.2 Thousand/uL (ref 3.8–10.8)

## 2024-01-20 LAB — COMPREHENSIVE METABOLIC PANEL WITH GFR
AG Ratio: 1.4 (calc) (ref 1.0–2.5)
ALT: 81 U/L — ABNORMAL HIGH (ref 6–29)
AST: 74 U/L — ABNORMAL HIGH (ref 10–35)
Albumin: 4.2 g/dL (ref 3.6–5.1)
Alkaline phosphatase (APISO): 137 U/L (ref 37–153)
BUN: 11 mg/dL (ref 7–25)
CO2: 26 mmol/L (ref 20–32)
Calcium: 9.3 mg/dL (ref 8.6–10.4)
Chloride: 104 mmol/L (ref 98–110)
Creat: 0.85 mg/dL (ref 0.50–1.05)
Globulin: 3 g/dL (ref 1.9–3.7)
Glucose, Bld: 167 mg/dL — ABNORMAL HIGH (ref 65–99)
Potassium: 4.6 mmol/L (ref 3.5–5.3)
Sodium: 138 mmol/L (ref 135–146)
Total Bilirubin: 0.5 mg/dL (ref 0.2–1.2)
Total Protein: 7.2 g/dL (ref 6.1–8.1)
eGFR: 77 mL/min/1.73m2 (ref >=60)

## 2024-01-20 MED ORDER — PREDNISONE 20 MG PO TABS: 40.0000 mg | ORAL_TABLET | Freq: Every day | ORAL | 0 refills | Status: AC

## 2024-01-20 MED ORDER — AZITHROMYCIN 250 MG PO TABS: ORAL_TABLET | ORAL | 0 refills | Status: AC

## 2024-01-20 NOTE — Progress Notes (Signed)
 BP (!) 142/82   Pulse 87   Temp 98.2 F (36.8 C)   Resp 18   Ht 5' 4 (1.626 m)   Wt 208 lb 4.8 oz (94.5 kg)   SpO2 95%   BMI 35.75 kg/m    Subjective:    Patient ID: Victoria [name] Hum, female    DOB: 07-03-1960, 63 y.o.   MRN: [id number]  HPI: Victoria [name] is a 63 y.o. female  Chief Complaint  Patient presents with   Cough    States get bronchitis alot   Emesis   Nausea    Discussed the use of AI scribe software for clinical note transcription with the patient, who gave verbal consent to proceed.  History of Present Illness Victoria [name] is a 63 year old female with chronic bronchitis who presents with an acute exacerbation of her symptoms.  Respiratory symptoms - Chronic bronchitis with acute exacerbation over the past week - Significant dyspnea, especially when standing, requiring support - Persistent shortness of breath and pain despite use of albuterol  inhaler, DuoNeb as needed, and daily Trelegy - Significant drainage present - Breathing treatments and inhalers have not provided lasting relief  Constitutional symptoms - Feverish sensation - Excessive sweating - Lack of appetite - Vomiting  Metabolic concerns - Concern about weight gain - Borderline diabetes with A1c fluctuating around the diabetic threshold, previously recorded at 6.5  Infectious and oncologic history - History of precancerous lesion removal - Concern about overall health and risk for additional infections  Medication tolerance and efficacy - Tolerates prednisone  for short durations, up to three days - Tolerates doxycycline  - History of inadequate response to some antibiotics, either due to insufficient strength or duration         12/05/2023    2:22 PM 11/17/2023   10:46 AM 09/27/2023    1:18 PM  Depression screen PHQ 2/9  Decreased Interest 3 3 0  Down, Depressed, Hopeless 3 3 0  PHQ - 2 Score 6 6 0  Altered sleeping  3   Tired, decreased energy  3   Change in  appetite  3   Feeling bad or failure about yourself   3   Trouble concentrating  3   Moving slowly or fidgety/restless  0   Suicidal thoughts  0   PHQ-9 Score  21   Difficult doing work/chores  Very difficult     Relevant past medical, surgical, family and social history reviewed and updated as indicated. Interim medical history since our last visit reviewed. Allergies and medications reviewed and updated.  Review of Systems  Ten systems reviewed and is negative except as mentioned in HPI       Objective:     BP (!) 142/82   Pulse 87   Temp 98.2 F (36.8 C)   Resp 18   Ht 5' 4 (1.626 m)   Wt 208 lb 4.8 oz (94.5 kg)   SpO2 95%   BMI 35.75 kg/m    Wt Readings from Last 3 Encounters:  01/20/24 208 lb 4.8 oz (94.5 kg)  01/03/24 198 lb (89.8 kg)  12/20/23 198 lb (89.8 kg)    Physical Exam Physical Exam GENERAL: Alert, cooperative, well developed, no acute distress HEENT: Normocephalic, normal oropharynx, moist mucous membranes CHEST: wheezing and rhonchi  CARDIOVASCULAR: Normal heart rate and rhythm, S1 and S2 normal without murmurs ABDOMEN: Soft, non-tender, non-distended, without organomegaly, normal bowel sounds EXTREMITIES: No cyanosis or edema NEUROLOGICAL: Cranial nerves grossly intact, moves all  extremities without gross motor or sensory deficit   Results for orders placed or performed in visit on 12/20/23  Bladder Scan (Post Void Residual) in office   Collection Time: 12/20/23  2:42 PM  Result Value Ref Range   Scan Result 11ml           Assessment & Plan:   Problem List Items Addressed This Visit   None Visit Diagnoses       Chronic bronchitis, unspecified chronic bronchitis type (HCC)    -  Primary   Relevant Medications   predniSONE  (DELTASONE ) 20 MG tablet   azithromycin  (ZITHROMAX ) 250 MG tablet   Other Relevant Orders   CBC with Differential/Platelet   Comprehensive metabolic panel with GFR   DG Chest 2 View        Assessment and  Plan Assessment & Plan Chronic bronchitis with acute exacerbation Chronic bronchitis with acute exacerbation presenting with increased dyspnea, cough, and chest pain, worsening over the past week. Differential diagnosis includes pneumonia, to be evaluated with a chest x-ray. Symptoms of fever, sweating, and vomiting suggest possible infection. Current medications include albuterol  inhaler, Trelegy, and DuoNeb. Prednisone  is tolerated for short courses, and azithromycin  and doxycycline  are potential antibiotic options. - Order chest x-ray to evaluate for pneumonia. - Perform blood work to monitor condition. - Prescribe prednisone  for 3 days due to tolerance issues with longer courses. - Prescribe azithromycin  as initial antibiotic treatment. - Consider doxycycline  if pneumonia is confirmed or if azithromycin  is insufficient. - Advise to go to the emergency room if symptoms worsen.  Pre-diabetes Pre-diabetes with recent A1c levels fluctuating around the diabetes threshold. Last A1c was 6.5 in July, raising concerns about progression to diabetes. - Monitor A1c levels in October.        Follow up plan: Return if symptoms worsen or fail to improve.

## 2024-01-20 NOTE — Telephone Encounter (Signed)
 FYI Only or Action Required?: FYI only for provider.  Patient was last seen in primary care on 11/29/2023 by Bernardo Fend, DO.  Called Nurse Triage reporting Vomiting and Cough.nausea  Symptoms began a week ago.  Interventions attempted: Other: bronchial dilator.  Symptoms are: gradually worsening.  Triage Disposition: See HCP Within 4 Hours (Or PCP Triage)  Patient/caregiver understands and will follow disposition?: Yes                   Copied from CRM #[id number]. Topic: Clinical - Medication Question >> Jan 20, 2024 11:26 AM Myrick T wrote: Reason for CRM: patient called stated she is nauseous, coughing up yellow and clear mucus and she has thrown up a couple of times. Patient said she is just not feeling well to the point that when she drink or eat it comes back up. Reason for Disposition  [1] MILD difficulty breathing (e.g., minimal/no SOB at rest, SOB with walking, pulse < 100) AND [2] still present when not coughing  Answer Assessment - Initial Assessment Questions 1. VOMITING SEVERITY: How many times have you vomited in the past 24 hours?      3 times 2. ONSET: When did the vomiting begin?      Past week  Answer Assessment - Initial Assessment Questions 1. ONSET: When did the cough begin?      PT has long term breathing issues 2. SEVERITY: How bad is the cough today?      moderate 5. DIFFICULTY BREATHING: Are you having difficulty breathing? If Yes, ask: How bad is it? (e.g., mild, moderate, severe)      Mild/moderate 8. LUNG HISTORY: Do you have any history of lung disease?  (e.g., pulmonary embolus, asthma, emphysema)     yes 10. OTHER SYMPTOMS: Do you have any other symptoms? (e.g., runny nose, wheezing, chest pain)       Nausea and vomiting from cough, just feels poorly  Protocols used: Vomiting-A-AH, Cough - Acute Non-Productive-A-AH

## 2024-01-23 ENCOUNTER — Ambulatory Visit: Payer: Self-pay | Admitting: Nurse Practitioner

## 2024-01-24 ENCOUNTER — Other Ambulatory Visit: Payer: Self-pay

## 2024-01-24 ENCOUNTER — Ambulatory Visit

## 2024-01-24 DIAGNOSIS — M6289 Other specified disorders of muscle: Secondary | ICD-10-CM | POA: Diagnosis not present

## 2024-01-24 DIAGNOSIS — M6281 Muscle weakness (generalized): Secondary | ICD-10-CM

## 2024-01-24 DIAGNOSIS — N3946 Mixed incontinence: Secondary | ICD-10-CM | POA: Diagnosis not present

## 2024-01-24 DIAGNOSIS — R2689 Other abnormalities of gait and mobility: Secondary | ICD-10-CM | POA: Diagnosis not present

## 2024-01-24 DIAGNOSIS — R293 Abnormal posture: Secondary | ICD-10-CM
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
## 2024-01-26 DIAGNOSIS — F39 Unspecified mood [affective] disorder: Secondary | ICD-10-CM | POA: Diagnosis not present

## 2024-01-26 DIAGNOSIS — F411 Generalized anxiety disorder: Secondary | ICD-10-CM | POA: Diagnosis not present

## 2024-01-26 DIAGNOSIS — F29 Unspecified psychosis not due to a substance or known physiological condition: Secondary | ICD-10-CM | POA: Diagnosis not present

## 2024-01-26 DIAGNOSIS — F5105 Insomnia due to other mental disorder: Secondary | ICD-10-CM | POA: Diagnosis not present

## 2024-01-26 DIAGNOSIS — F410 Panic disorder [episodic paroxysmal anxiety] without agoraphobia: Secondary | ICD-10-CM | POA: Diagnosis not present

## 2024-01-26 DIAGNOSIS — F4312 Post-traumatic stress disorder, chronic: Secondary | ICD-10-CM | POA: Diagnosis not present

## 2024-01-26 NOTE — Progress Notes (Signed)
 Initial neurology clinic note  Reason for Evaluation: Consultation requested by Clois Fret, MD for an opinion regarding neuropathy. My final recommendations will be communicated back to the requesting physician by way of shared medical record or letter to requesting physician via [place]  mail.  HPI: This is [name], a 63 y.o. left-handed female with a medical history of lumbosacral radiculopathy s/p MIS TLIF L4-L5 w/ PSF (10/17/23), DM, HTN, HLD, vit D deficiency, SVT, OSA, IBS, non-alcoholic steatohepatitis, fibromyalgia, bipolar depression who presents to neurology clinic with the chief complaint of neuropathy. The patient is accompanied by husband.  Patient has had symptoms in her legs for 10 years. It has progressed over the years. Patient has weakness in her legs, particularly when going up and down steps. She has imbalance, particularly when closing her eyes in the shower. She has numbness and tingling in bilateral feet into the legs into the thighs. It is worse at night. The left side is worse than the right. She has a lot of pain in her arms when lifting them above her head and with get numbness in her legs. She was previously riding horses well, but is having a lot of problems with this.   Patient had surgery with Dr. Clois 10/17/23 (MIS TLIF L4-L5 w/ PSF). After surgery, there was a 30% reduction in pain. She could barely walk prior to the surgery, but now is walking independently. She is doing PT and does think she has improved since surgery.  Patient has been referred to Dr. Marcelino in pain management for possible spinal cord stimulator.  Patient has seen Dr. Maree at [facility] in [place] for this previously (most recently on 11/25/22). Per that note: 1. Chronic severe pain, in a patient with history of fibromyalgia, chronic fatigue syndrome, chronic low back pain on chronic opioid therapy with recent worsening of pain - considered opioid hyperalgesia. Patient  thought she had lyme's disease but her labs were negative, status post doxycycline  course. Discussed chronic nerve pain syndrome - Patient is not eating much, very thirsty, gaining weight, whole body paralysis that comes and goes   - Talk to your pain management doctor about a spinal cord stimulator.  - Talk to your pain management doctor about Suboxone/Methadone  - Increase your Lyrica to 75 mg in morning and 150 mg at night for 1 month, then increase to Lyrica to 150 mg in the morning and 150 mg at night.  Talk to your pain management doctor about taking over  nerve conduction study upper extremity 08/03/2022 - This is an abnormal electrodiagnostic exam consistent with 1) Right moderate (grade III) carpal tunnel syndrome (median nerve entrapment at wrist). 2) Left mild (grade II) carpal tunnel syndrome (median nerve entrapment at wrist).  nerve conduction study lower extremity 09/14/2022 - This is an abnormal electrodiagnostic study consistent with a generalized sensory polyneuropathy. We reviewed labs, imaging, and notes in [place], [place], and from outside providers, if available.   - Discussed the benefits of volunteering your time. A good place to start is within your local community or on [employer]. This website includes traditional and remote volunteer opportunities, so even those with limited transportation or mobility can help make a difference.   - Discussed an anti-inflammatory diet: Consider cutting out refined sugar and eating a more whole food, plant based diet.   - Headspace (paid app), Calm, and Mindfulness Coach are apps that can help teach and guide meditation with short easy to use sessions. Meditation has shown  benefits in multiple neurological conditions including stress, focus, attention, insomnia, etc. For more information visit www.headspace.com, www.calm.com, or OpinionSwap.es  Your goal should be how well you are able to function as  opposed to a pain free life  2. History of subdural hematoma contrast and noncontrast brain MRI 07/26/2022 - 1. Mildly motion degraded exam. 2. No evidence of an acute intracranial abnormality. 3. No evidence of an acute or chronic subdural hematoma. 4. Mild chronic small vessel ischemic changes within the cerebral  white matter and basal ganglia.   3. Stress Patient is stressed about her current condition as she has not improved on medication  4. Fatty liver disease   I do not have the reports of previous EMGs for review.  She does not report fever or unintentional weight loss. She has been having night sweats for a couple of years.  EtOH use: none  Restrictive diet? No Family history of neuropathy/myopathy/neurologic disease? No, but a lot of autoimmune issues  Patient thinks she has CIDP or MS.   Of note, patient takes B complex.   MEDICATIONS:  Outpatient Encounter Medications as of 02/03/2024  Medication Sig   albuterol  (VENTOLIN  HFA) 108 (90 Base) MCG/ACT inhaler Inhale 1-2 puffs into the lungs every 6 (six) hours as needed for wheezing or shortness of breath.   ARIPiprazole (ABILIFY) 5 MG tablet Take 5 mg by mouth daily.   B Complex Vitamins (B-COMPLEX/B-12 PO) Take by mouth.   benzonatate  (TESSALON  PERLES) 100 MG capsule Take 1 capsule (100 mg total) by mouth every 6 (six) hours as needed for cough.   cholecalciferol  (VITAMIN D ) 25 MCG tablet Take 1 tablet (1,000 Units total) by mouth daily.   estradiol  (ESTRACE  VAGINAL) 0.1 MG/GM vaginal cream Apply 0.5mg  (pea-sized amount)  just inside the vaginal introitus with a finger-tip on Monday, Wednesday and Friday nights.   FLUoxetine  (PROZAC ) 40 MG capsule Take by mouth every morning.   Fluticasone -Umeclidin-Vilant (TRELEGY ELLIPTA ) 100-62.5-25 MCG/ACT AEPB Inhale 1 puff into the lungs daily.   gabapentin  (NEURONTIN ) 300 MG capsule Take 3 capsules (900 mg total) by mouth 3 (three) times daily. (Patient taking differently: Take 300  mg by mouth 3 (three) times daily.)   hydrocortisone  2.5 % cream Apply topically behind ears on m-w-f at bedtime for seborrheic dermatitis   ketoconazole  (NIZORAL ) 2 % cream Apply 1 Application topically daily.   ketoconazole  (NIZORAL ) 2 % shampoo apply 2 times per week, massage into behind ears and scalp and leave in for 5 -  10 minutes before rinsing out for seborrheic dermatitis   losartan  (COZAAR ) 25 MG tablet Take 1 tablet (25 mg total) by mouth daily.   methylPREDNISolone  (MEDROL  DOSEPAK) 4 MG TBPK tablet Take per package instructions   miconazole (MONISTAT 1 COMBINATION PACK) kit Place 1 each vaginally once. (Patient taking differently: Place 1 each vaginally as needed.)   MOVANTIK  25 MG TABS tablet Take 1 tablet (25 mg total) by mouth daily as needed (Constipation).   Multiple Vitamins-Minerals (MULTIVITAMIN WITH MINERALS) tablet Take 1 tablet by mouth daily.   ondansetron  (ZOFRAN ) 8 MG tablet Take 1 tablet (8 mg total) by mouth every 8 (eight) hours as needed for nausea or vomiting.   ondansetron  (ZOFRAN -ODT) 4 MG disintegrating tablet Take 1 tablet (4 mg total) by mouth every 8 (eight) hours as needed for nausea or vomiting.   Oxycodone  HCl 10 MG TABS Take 1 tablet (10 mg total) by mouth every 6 (six) hours as needed (severe pain).   pantoprazole  (PROTONIX )  40 MG tablet TAKE 1 TABLET BY MOUTH EVERY DAY IN THE MORNING   polyvinyl alcohol  (LIQUIFILM TEARS) 1.4 % ophthalmic solution Place 1 drop into both eyes 3 (three) times daily. (Patient taking differently: Place 1 drop into both eyes as needed.)   SUMAtriptan  (IMITREX ) 100 MG tablet Take 1 tablet (100 mg total) by mouth once as needed for migraine. May repeat in 2 hours if headache persists or recurs.   venlafaxine  XR (EFFEXOR -XR) 75 MG 24 hr capsule Take 75 mg by mouth daily.   ipratropium-albuterol  (DUONEB) 0.5-2.5 (3) MG/3ML SOLN Take 3 mLs by nebulization 3 (three) times daily as needed (cough wheeze SOB bronchitis/asthma symptoms).    QUEtiapine  (SEROQUEL ) 100 MG tablet Take 1 tablet (100 mg total) by mouth 2 (two) times daily. (Patient not taking: Reported on 02/03/2024)   No facility-administered encounter medications on file as of 02/03/2024.    PAST MEDICAL HISTORY: 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   right forearm pathology report scanned refer to media   Melanocarcinoma [facility])    Osteoarthritis of both hips    Pre-diabetes    Psychotic disorder (HCC)    Pulmonary fibrosis (HCC)    Sleep apnea     PAST SURGICAL HISTORY: Past Surgical History:  Procedure Laterality Date   ABDOMINAL HYSTERECTOMY     APPENDECTOMY  2006   APPLICATION OF INTRAOPERATIVE CT SCAN N/A 10/17/2023   Procedure: APPLICATION OF INTRAOPERATIVE CT SCAN;  Surgeon: Clois Fret, MD;  Location: [facility] ORS;  Service: Neurosurgery;  Laterality: N/A;   BREAST EXCISIONAL BIOPSY Right 1997   neg   ESOPHAGOGASTRODUODENOSCOPY (EGD) WITH PROPOFOL  N/A 09/20/2022   Procedure: ESOPHAGOGASTRODUODENOSCOPY (EGD) WITH PROPOFOL ;  Surgeon: Therisa Bi, MD;  Location: [facility] ENDOSCOPY;  Service: Gastroenterology;  Laterality: N/A;   excision of melanoma on Right arm     HIATAL HERNIA REPAIR     Jan 2017   LAPAROSCOPY  2020   TRANSFORAMINAL LUMBAR INTERBODY FUSION W/ MIS 1 LEVEL N/A 10/17/2023   Procedure: L4-5 MINIMALLY INVASIVE (MIS) TRANSFORAMINAL LUMBAR INTERBODY FUSION (TLIF);  Surgeon: Clois Fret, MD;  Location: [facility] ORS;  Service: Neurosurgery;  Laterality: N/A;  L4-5 MINIMALLY INVASIVE (MIS) TRANSFORAMINAL LUMBAR INTERBODY FUSION (TLIF)    ALLERGIES: Allergies  Allergen Reactions   Acetaminophen  Other (See Comments)    Liver issues   Atorvastatin      Myalgias    Sertraline Hives and Shortness Of Breath   Cefuroxime Hives    Has tolerated 1st generation cephalosporin (CEPHALEXIN) in the past with no documented ADRs.    Codeine Hives   Tape     Adhesive tape    Amoxicillin Rash   Penicillins Rash    Has tolerated 1st generation cephalosporin (CEPHALEXIN) in the past with no documented ADRs.    Sulfa Antibiotics Rash    FAMILY HISTORY: Family History  Problem Relation Age of Onset   Alcohol  abuse Mother    Arthritis Mother    Heart disease Mother    Liver disease Mother    Osteoporosis Mother    Clotting disorder Mother    Diabetes Father    Arthritis Father  Stroke Father    Sarcoidosis Father    Heart failure Father    Bipolar disorder Sister    Schizophrenia Sister    Depression Sister    Anxiety disorder Sister    Alcohol  abuse Sister    Diabetes Sister    Arthritis Sister    Thyroid  disease Sister    Lupus Sister    Depression Sister    Anxiety disorder Sister    Alcohol  abuse Sister    Depression Sister    Anxiety disorder Sister    Ehlers-Danlos syndrome Daughter     SOCIAL HISTORY: Social History   Tobacco Use   Smoking status: Former    Current packs/day: 0.00    Types: Cigarettes    Quit date: 1987    Years since quitting: 38.7   Smokeless tobacco: Never  Vaping Use   Vaping status: Never Used  Substance Use Topics   Alcohol  use: No    Alcohol /week: 0.0 standard drinks of alcohol    Drug use: No    Comment: No use of illict drugs.   Social History   Social History Narrative   Lives with husband at home.   Are you right handed or left handed? Left   Are you currently employed ?    What is your current occupation? Retired/disabled   Do you live at home alone?   Who lives with you? husband   What type of home do you live in: 1 story or 2 story? two    Caffeine  3 cokes     OBJECTIVE: PHYSICAL EXAM: BP (!) 145/84   Pulse 82   Ht 5' 4 (1.626 m)   Wt 203 lb (92.1 kg)   SpO2 95%    BMI 34.84 kg/m   General: General appearance: Awake and alert. No distress. Cooperative with exam.  Skin: No obvious rash or jaundice. HEENT: Atraumatic. Anicteric. Lungs: Non-labored breathing on room air  Heart: Regular Extremities: No edema. Psych: Affect appropriate.  Neurological: Mental Status: Alert. Speech fluent. No pseudobulbar affect Cranial Nerves: CNII: No RAPD. Visual fields grossly intact. CNIII, IV, VI: PERRL. No nystagmus. EOMI. CN V: Facial sensation intact bilaterally to fine touch. CN VII: Facial muscles symmetric and strong. No ptosis at rest. CN VIII: Hearing grossly intact bilaterally. CN IX: No hypophonia. CN X: Palate elevates symmetrically. CN XI: Full strength shoulder shrug bilaterally. CN XII: Tongue protrusion full and midline. No atrophy or fasciculations. No significant dysarthria Motor: Tone is normal. No atrophy.  Individual muscle group testing (MRC grade out of 5):  Movement     Neck flexion 5    Neck extension 5     Right Left   Shoulder abduction 5 5   Shoulder adduction 5 5   Shoulder ext rotation 5 5   Shoulder int rotation 5 5   Elbow flexion 5 5   Elbow extension 5 5   Finger abduction - FDI 5 5   Finger abduction - ADM 5 5   Finger extension 5 5   Finger distal flexion - 2/3 5 5    Finger distal flexion - 4/5 5 5    Thumb flexion - FPL 5- 5-   Thumb abduction - APB 5 5    Hip flexion 5 5-   Hip extension 5 5   Hip adduction 5 5   Hip abduction 5 5   Knee extension 5 5   Knee flexion 5 5   Dorsiflexion 5 5-   Plantarflexion 5 5  Inversion 5 5   Eversion 5 5     Reflexes:  Right Left   Bicep 2+ 2+   Tricep 2+ 2+   BrRad 2+ 2+   Knee 2+ 2+   Ankle 2+ 0    Pathological Reflexes: Babinski: flexor response bilaterally Hoffman: positive bilaterally Troemner: negative bilaterally Sensation: Pinprick: Patchy in all extremities (appears diminished in left lateral lower leg) Vibration: Intact in all  extremities Proprioception: Intact in bilateral great toes Coordination: Intact finger-to- nose-finger bilaterally. Romberg negative. Gait: Able to rise from chair with arms crossed unassisted. Normal, narrow-based gait. Able to tandem walk. Able to walk on toes and heels.  Lab and Test Review: Internal labs: 01/20/24: CMP significant for glucose 167, AST 74, ALT 81 CBC w/ diff unremarkable  11/29/23: ANA positive at 1:40 ESR 43 CRP 9.7 HbA1c: 6.2  07/08/23: Lipid panel: tChol 278, LDL 190, TG 209 HbA1c: 6.6  02/01/23: CK: 56 Celiac panel negative Ferritin 202  Imaging/Procedures: MRI cervical, thoracic, and lumbar spine wo contrast (10/08/21): FINDINGS: MRI CERVICAL SPINE FINDINGS   Alignment: Examination mildly degraded by motion artifact.   Straightening with mild reversal of the normal cervical lordosis. No listhesis.   Vertebrae: Vertebral body height maintained without acute or chronic fracture. Bone marrow signal intensity within normal limits. No discrete or worrisome osseous lesions. No abnormal marrow edema.   Cord: Normal signal and morphology.   Posterior Fossa, vertebral arteries, paraspinal tissues: Visualized brain and posterior fossa within normal limits. Craniocervical junction normal. Paraspinous and prevertebral soft tissues within normal limits. Normal intravascular flow voids seen within the vertebral arteries bilaterally.   Disc levels:   C2-C3: Unremarkable.   C3-C4: Minimal disc bulge with right-sided uncovertebral spurring. No significant spinal stenosis. Mild right C4 foraminal narrowing. Left neural foramen is patent.   C4-C5: Degenerative intervertebral disc space narrowing with diffuse disc bulge. Associated endplate and uncovertebral spurring. Flattening and partial effacement of the ventral thecal sac. Thecal sac remains fairly capacious without significant spinal stenosis. Mild right greater than left C5 foraminal stenosis.    C5-C6: Degenerative intervertebral disc space narrowing with diffuse disc osteophyte complex. Broad posterior component flattens and partially effaces the ventral thecal sac, slightly asymmetric to the right. Mild spinal stenosis. Moderate to severe bilateral C6 foraminal narrowing.   C6-C7: Degenerative intervertebral disc space narrowing with diffuse disc osteophyte complex. Flattening of the ventral thecal sac without significant spinal stenosis. Left greater than right uncovertebral spurring with resultant moderate left and mild right C7 foraminal stenosis.   C7-T1: Negative interspace. Left greater than right facet hypertrophy. No stenosis.   MRI THORACIC SPINE FINDINGS   Alignment:  Examination somewhat degraded by motion artifact.   Exaggeration of the normal upper thoracic kyphosis.  No listhesis.   Vertebrae: Vertebral body height maintained without acute or chronic fracture. Bone marrow signal intensity within normal limits. Subcentimeter benign hemangioma noted within the T8 vertebral body. No worrisome osseous lesions. Mild discogenic reactive endplate change noted about the T7-8, T9-10 and T10-11 interspaces anteriorly. No other abnormal marrow edema.   Cord:  Normal signal and morphology.   Paraspinal and other soft tissues: Unremarkable.   Disc levels:   Minimal for age noncompressive disc bulging noted at T6-7 through T10-11. Mild reactive endplate change with marrow edema about the T7-8, T9-10 and T10-11 interspace anteriorly. Single small right paracentral disc herniation present at T7-8 (series 29, image 23). No other focal disc herniation. No significant facet pathology. No spinal stenosis or cord impingement. Foramina  remain widely patent.   MRI LUMBAR SPINE FINDINGS   Segmentation:  Examination degraded by motion artifact.   Standard segmentation. Lowest well-formed disc space labeled the L5-S1 level.   Alignment: 6 mm anterolisthesis of L4 on  L5, chronic and facet mediated. Alignment otherwise normal with preservation of the normal lumbar lordosis.   Vertebrae: Vertebral body height maintained without acute or chronic fracture. Bone marrow signal intensity within normal limits. No discrete or worrisome osseous lesions. Reactive marrow edema present about the L4-5 facets bilaterally due to facet arthritis. No other abnormal marrow edema.   Conus medullaris and cauda equina: Conus extends to the L1 level. Conus and cauda equina appear normal.   Paraspinal and other soft tissues: Unremarkable.   Disc levels:   L1-2:  Unremarkable.   L2-3: Mild annular disc bulge. No significant spinal stenosis. Foramina remain patent.   L3-4: Mild annular disc bulge. Mild facet hypertrophy. No significant spinal stenosis. Foramina remain patent.   L4-5: 6 mm anterolisthesis. Associated degenerative intervertebral disc space narrowing with disc desiccation and broad posterior pseudo disc bulge/uncovering. Moderate facet and ligament flavum hypertrophy with associated trace joint effusions and reactive marrow edema. Resultant severe spinal stenosis with mild bilateral L4 foraminal narrowing, slightly worse on the right.   L5-S1: Negative interspace. Mild to moderate facet hypertrophy. No stenosis.   IMPRESSION: MRI CERVICAL SPINE IMPRESSION:   1. Mild-to-moderate degenerative spondylosis at C4-5 through C6-7 with resultant mild spinal stenosis at C5-6. No frank cord impingement. 2. Multifactorial degenerative changes with resultant multilevel foraminal narrowing as above. Notable findings include moderate to severe bilateral C6 foraminal stenosis with moderate left C7 foraminal narrowing.   MRI THORACIC SPINE IMPRESSION:   1. Mild for age spondylosis at T6-7 through T10-11, with single small right paracentral disc protrusion at T7-8. No significant stenosis or neural impingement. 2. Mild discogenic reactive endplate change  with marrow edema about the T7-8, T9-10 and T10-11 interspaces. Findings could contribute to back pain.   MRI LUMBAR SPINE IMPRESSION:   1. 6 mm facet mediated anterolisthesis at L4-5 with resultant severe spinal stenosis, with mild bilateral L4 foraminal narrowing. 2. Mild to moderate facet hypertrophy at L5-S1 without stenosis.  MRI brain w/wo contrast (07/26/22): FINDINGS: Mild intermittent motion degradation.   Brain:   No age advanced or lobar predominant parenchymal atrophy.   Multifocal T2 FLAIR hyperintense signal abnormality within the cerebral white matter and bilateral basal ganglia, nonspecific but compatible with mild chronic small vessel ischemic disease.   No cortical encephalomalacia is identified.   There is no acute infarct.   No evidence of an intracranial mass.   No chronic intracranial blood products.   No extra-axial fluid collection.   No midline shift.   No pathologic intracranial enhancement identified.   Vascular: Maintained flow voids within the proximal large arterial vessels.   Skull and upper cervical spine: No focal suspicious marrow lesion.   Sinuses/Orbits: No mass or acute finding within the imaged orbits. No significant paranasal sinus disease.   IMPRESSION: 1. Mildly motion degraded exam. 2. No evidence of an acute intracranial abnormality. 3. No evidence of an acute or chronic subdural hematoma. 4. Mild chronic small vessel ischemic changes within the cerebral white matter and basal ganglia.  MRI lumbar spine wo contrast (05/14/23): FINDINGS: Segmentation: 5 lumbar type vertebral bodies as numbered previously.   Alignment: Chronic degenerative anterolisthesis at L4-5 of 6 mm, unchanged since last year. This could worsen with standing or flexion based on the morphology of the  facet arthropathy.   Vertebrae: Mild degenerative endplate edema at the L4-5 level could contribute to regional pain. Facet joint edema at L4-5 with  gaping, fluid-filled joints.   Conus medullaris and cauda equina: Conus extends to the L1-2 level. Conus and cauda equina appear normal.   Paraspinal and other soft tissues: Negative   Disc levels:   No significant finding from T12-L1 through L2-3.   L3-4: Minimal bulging of the disc. Minimal facet hypertrophy. No stenosis.   L4-5: Bilateral facet arthropathy with gaping, fluid-filled joints, worsened since 2023. Anterolisthesis of 6 mm because of this facet arthropathy, which would likely worsen with standing or flexion. Circumferential bulging of the disc. Severe multifactorial stenosis at this level that would likely worsen with standing or flexion.   L5-S1: No disc abnormality. Mild facet osteoarthritis. No stenosis.   IMPRESSION: 1. L4-5: Bilateral facet arthropathy with gaping, fluid-filled joints, worsened since 2023. Anterolisthesis of 6 mm because of this facet arthropathy, which would likely worsen with standing or flexion. Circumferential bulging of the disc. Severe multifactorial stenosis at this level that would likely worsen with standing or flexion. 2. L5-S1: Mild facet osteoarthritis. No stenosis.  Lumbar spine xray (01/03/24): Posterior fusion hardware at L4-L5 without complicating features.   ASSESSMENT: Victoria [name] is a 63 y.o. female who presents for evaluation of diffuse pain and weakness. She has a relevant medical history of lumbosacral radiculopathy s/p MIS TLIF L4-L5 w/ PSF (10/17/23), DM, HTN, HLD, vit D deficiency, SVT, OSA, IBS, non-alcoholic steatohepatitis, fibromyalgia, bipolar depression. Her neurological examination is pertinent for patchy sensory loss most pronounced in left lower limb and absent left ankle jerk reflex. Her strength testing shows no clear objective weakness. The etiology of patient's symptoms is currently unclear. The asymmetry of her lower extremities suggests lumbosacral spine disease as the most likely etiology. A previous  external EMG that I cannot see suggested neuropathy, which while possible, is not completely clear on exam today. Patient is worried about MS or CIDP. Her MRI brain looks great without evidence of MS. Intact reflexes (except left ankle jerk) makes CIDP very unlikely. At least some symptoms are likely secondary to known fibromyalgia. I will get labs and repeat EMG for clarification of etiology.  PLAN: -Blood work: CK, B1, B6, B12, folate, MMA -EMG: PN (L side) -Pain management per Dr. Marcelino  -Return to clinic to be determined  The impression above as well as the plan as outlined below were extensively discussed with the patient (in the company of husband) who voiced understanding. All questions were answered to their satisfaction.  The patient was counseled on pertinent fall precautions per the printed material provided today, and as noted under the Patient Instructions section below.  When available, results of the above investigations and possible further recommendations will be communicated to the patient via telephone/MyChart. Patient to call office if not contacted after expected testing turnaround time.   Total time spent reviewing records, interview, history/exam, documentation, and coordination of care on day of encounter:  70 min   Thank you for allowing me to participate in patient's care.  If I can answer any additional questions, I would be pleased to do so.  Venetia Potters, MD   CC: Bernardo Fend, DO [address] [place] [address]  CC: Referring provider: Clois Fret, MD [address] [place],  [place] [address]

## 2024-01-31 ENCOUNTER — Ambulatory Visit

## 2024-01-31 ENCOUNTER — Encounter: Payer: Self-pay | Admitting: Family Medicine

## 2024-01-31 ENCOUNTER — Ambulatory Visit: Payer: Self-pay

## 2024-01-31 ENCOUNTER — Other Ambulatory Visit (HOSPITAL_COMMUNITY)
Admission: RE | Admit: 2024-01-31 | Discharge: 2024-01-31 | Disposition: A | Discharge status: Home / Self Care | Source: Ambulatory Visit | Attending: Family Medicine | Admitting: Family Medicine

## 2024-01-31 ENCOUNTER — Ambulatory Visit (INDEPENDENT_AMBULATORY_CARE_PROVIDER_SITE_OTHER): Admitting: Family Medicine

## 2024-01-31 VITALS — BP 153/75 | HR 83 | Ht 64.0 in | Wt 206.5 lb

## 2024-01-31 DIAGNOSIS — J8410 Pulmonary fibrosis, unspecified: Secondary | ICD-10-CM | POA: Diagnosis not present

## 2024-01-31 DIAGNOSIS — N898 Other specified noninflammatory disorders of vagina: Secondary | ICD-10-CM

## 2024-01-31 DIAGNOSIS — J42 Unspecified chronic bronchitis: Secondary | ICD-10-CM

## 2024-01-31 MED ORDER — METHYLPREDNISOLONE 4 MG PO TBPK: ORAL_TABLET | ORAL | 0 refills | Status: DC

## 2024-01-31 NOTE — Telephone Encounter (Signed)
 FYI Only or Action Required?: FYI only for provider.  Patient was last seen in primary care on 01/20/2024 by Gareth Mliss FALCON, FNP.  Called Nurse Triage reporting Dysuria.  Symptoms began several weeks ago.  Interventions attempted: Prescription medications: Prednisone , Zithromax .  Symptoms are: unchanged.  Triage Disposition: See Physician Within 24 Hours  Patient/caregiver understands and will follow disposition?: Yes         Copied from CRM #[id number]. Topic: Clinical - Red Word Triage >> Jan 31, 2024 10:28 AM Hamdi H wrote: [facility] that prompted transfer to Nurse Triage: Still having really bad bronchitis, hurts in upper chest when she breathes, and UTI symptoms burning and frequency.          Reason for Disposition  [1] Continuous (nonstop) coughing interferes with work or school AND [2] no improvement using cough treatment per Care Advice  Answer Assessment - Initial Assessment Questions 1. ONSET: When did the cough begin?      About 2 weeks  2. SEVERITY: How bad is the cough today?      Moderate  3. SPUTUM: Describe the color of your sputum (e.g., none, dry cough; clear, white, yellow, green)     No 4. HEMOPTYSIS: Are you coughing up any blood? If Yes, ask: How much? (e.g., flecks, streaks, tablespoons, etc.)     No 5. DIFFICULTY BREATHING: Are you having difficulty breathing? If Yes, ask: How bad is it? (e.g., mild, moderate, severe)      Mild  6. FEVER: Do you have a fever? If Yes, ask: What is your temperature, how was it measured, and when did it start?     No 7. CARDIAC HISTORY: Do you have any history of heart disease? (e.g., heart attack, congestive heart failure)      Yes 8. LUNG HISTORY: Do you have any history of lung disease?  (e.g., pulmonary embolus, asthma, emphysema)     Yes 9. PE RISK FACTORS: Do you have a history of blood clots? (or: recent major surgery, recent prolonged travel, bedridden)     No 10. OTHER  SYMPTOMS: Do you have any other symptoms? (e.g., runny nose, wheezing, chest pain)       Chest pain with cough and deep breathing, dysuria  Protocols used: Cough - Acute Non-Productive-A-AH

## 2024-01-31 NOTE — Progress Notes (Signed)
 Acute visit   Patient: Victoria [name]   DOB: 1960-12-15   63 y.o. Female  MRN: [id number] PCP: Bernardo Fend, DO   Chief Complaint  Patient presents with   Acute Visit    Patient is present due to contiuned cough after recent dx of bronchitis. She reports cough began 2 weeks ago with moderate severity and greenish/yellow sputum. Mild difficulty breathing, no fever. She reports cardiac and lung hx. She reports symptoms are becoming worse and that she thinks some of it could be allergies but she is not sure what to take.    Cough    Still having really bad bronchitis, hurts in upper chest when she breathes   Dysuria    Patient present due to UTI symptoms burning and frequency. She reports having vaginosis and would like to make sure not UTI due to pain.    Subjective    Discussed the use of AI scribe software for clinical note transcription with the patient, who gave verbal consent to proceed.  History of Present Illness   Victoria [name] is a 63 year old female with chronic bronchitis and pulmonary fibrosis who presents with respiratory symptoms and urinary issues. She is accompanied by her husband.  She experiences chest pain, shortness of breath, and a persistent cough for several weeks. She has chronic bronchitis and pulmonary fibrosis, with a recent decrease in fibrosis size. Previous treatment with azithromycin  and prednisone  provided temporary relief. She quit smoking at age 54. A recent chest x-ray was clear, and blood work, including white blood cell count, was normal.  She has burning and pain during urination for several months, with a 'fishy' and 'ammonia' smell and itching, attributed to vaginosis. Urine tests were normal. Urinary control has worsened since a previous surgery requiring catheterization. She has not used treatments for vaginosis due to concerns about over-the-counter options.        Review of Systems  Objective    BP (!) 153/75 (BP  Location: Left Arm, Patient Position: Sitting, Cuff Size: Normal)   Pulse 83   Ht 5' 4 (1.626 m)   Wt 206 lb 8 oz (93.7 kg)   SpO2 93%   BMI 35.45 kg/m  Physical Exam Vitals reviewed.  Constitutional:      General: She is not in acute distress.    Appearance: Normal appearance. She is well-developed. She is not diaphoretic.  HENT:     Head: Normocephalic and atraumatic.  Eyes:     General: No scleral icterus.    Conjunctiva/sclera: Conjunctivae normal.  Neck:     Thyroid : No thyromegaly.  Cardiovascular:     Rate and Rhythm: Normal rate and regular rhythm.     Heart sounds: Normal heart sounds. No murmur heard. Pulmonary:     Effort: Pulmonary effort is normal. No respiratory distress.     Breath sounds: Wheezing (coarse wheeze bilaterally) present. No rhonchi or rales.  Genitourinary:    Comments: GYN:  External genitalia within normal limits.  Vaginal mucosa pink, moist, normal rugae.  Nonfriable cervix without lesions, no bleeding, + white discharge noted on speculum exam.      Chaperone present Musculoskeletal:     Cervical back: Neck supple.     Right lower leg: No edema.     Left lower leg: No edema.  Lymphadenopathy:     Cervical: No cervical adenopathy.  Skin:    General: Skin is warm and dry.  Neurological:     Mental Status:  She is alert.       No results found for any visits on 01/31/24.  Assessment & Plan     Problem List Items Addressed This Visit       Respiratory   Pulmonary fibrosis (HCC) - Primary   Other Visit Diagnoses       Chronic bronchitis, unspecified chronic bronchitis type (HCC)         Vaginal discharge       Relevant Orders   Cervicovaginal ancillary only           Bacterial vaginosis (suspected) Suspected bacterial vaginosis with symptoms of fishy odor, itching, and dysuria for 6-8 months, possibly exacerbated by previous antibiotic use and catheterization post-surgery. No prior testing or treatment for vaginosis. -  Perform pelvic exam and obtain vaginal swab for testing. - Test swab for bacterial vaginosis, yeast, chlamydia, gonorrhea, and trichomonas. - Await swab results to guide treatment.  Chronic bronchitis with acute exacerbation Chronic bronchitis with acute exacerbation characterized by dyspnea, chest pain, wheezing, and persistent cough. Previous treatment with azithromycin  and prednisone  provided temporary relief. Current symptoms suggest an inflammatory rather than infectious process. Lungs are clear on x-ray, ruling out pneumonia. Methylprednisolone  is preferred over prednisone  due to better tolerance and reduced hyperactivity risk. - Prescribe methylprednisolone  (Medrol  Dosepak) for anti-inflammatory treatment. - Monitor response to methylprednisolone  and adjust treatment as necessary.  Pulmonary fibrosis Pulmonary fibrosis is well-managed with no recent progression. Current respiratory symptoms are attributed to chronic bronchitis exacerbation rather than fibrosis progression.  General Health Maintenance Borderline type 2 diabetes with concerns about elevated blood sugar levels. Dermatological history of melanoma and precancerous lesions. - Follow up with primary care provider for diabetes management.  Follow-Up Scheduled follow-up with primary care provider on October 2nd for ongoing management of diabetes and other chronic conditions. - Consider earlier appointment with primary care provider if symptoms worsen or for diabetes management.         Meds ordered this encounter  Medications   methylPREDNISolone  (MEDROL  DOSEPAK) 4 MG TBPK tablet    Sig: Take per package instructions    Dispense:  21 tablet    Refill:  0     Return if symptoms worsen or fail to improve.      Jon Eva, MD  [facility] [phone] (phone) [phone] (fax)  [facility]

## 2024-02-01 ENCOUNTER — Encounter: Payer: Self-pay | Admitting: Family Medicine

## 2024-02-02 ENCOUNTER — Ambulatory Visit: Payer: Self-pay | Admitting: Family Medicine

## 2024-02-02 LAB — CERVICOVAGINAL ANCILLARY ONLY
Bacterial Vaginitis (gardnerella): NEGATIVE
Candida Glabrata: NEGATIVE
Candida Vaginitis: POSITIVE — AB
Chlamydia: NEGATIVE
Comment: NEGATIVE
Comment: NEGATIVE
Comment: NEGATIVE
Comment: NEGATIVE
Comment: NEGATIVE
Comment: NORMAL
Neisseria Gonorrhea: NEGATIVE
Trichomonas: NEGATIVE

## 2024-02-02 MED ORDER — FLUCONAZOLE 150 MG PO TABS: 150.0000 mg | ORAL_TABLET | Freq: Once | ORAL | 0 refills | Status: AC

## 2024-02-03 ENCOUNTER — Encounter: Payer: Self-pay | Admitting: Neurology

## 2024-02-03 ENCOUNTER — Other Ambulatory Visit

## 2024-02-03 ENCOUNTER — Ambulatory Visit: Admitting: Neurology

## 2024-02-03 VITALS — BP 145/84 | HR 82 | Ht 64.0 in | Wt 203.0 lb

## 2024-02-03 DIAGNOSIS — M5417 Radiculopathy, lumbosacral region: Secondary | ICD-10-CM | POA: Diagnosis not present

## 2024-02-03 DIAGNOSIS — R52 Pain, unspecified: Secondary | ICD-10-CM

## 2024-02-03 DIAGNOSIS — R2689 Other abnormalities of gait and mobility: Secondary | ICD-10-CM | POA: Diagnosis not present

## 2024-02-03 DIAGNOSIS — R531 Weakness: Secondary | ICD-10-CM | POA: Diagnosis not present

## 2024-02-03 NOTE — Patient Instructions (Addendum)
 I saw you today for pain and weakness. Your symptoms in the legs seem most likely to be from the back, but neuropathy is possible. I see no evidence of MS.  I will investigate further with the following: -Blood work today -Repeat EMG focusing on your left side  When I have your results I will be in touch.  Continue to see Dr. Lateef for pain control.  Please let me know if you have any questions or concerns in the meantime.  The physicians and staff at [facility] are committed to providing excellent care. You may receive a survey requesting feedback about your experience at our office. We strive to receive very good responses to the survey questions. If you feel that your experience would prevent you from giving the office a very good  response, please contact our office to try to remedy the situation. We may be reached at [phone]. Thank you for taking the time out of your busy day to complete the survey.  Victoria Potters, MD [facility]  Preventing Falls at [facility] are common, often dreaded events in the lives of older people. Aside from the obvious injuries and even death that may result, fall can cause wide-ranging consequences including loss of independence, mental decline, decreased activity and mobility. Younger people are also at risk of falling, especially those with chronic illnesses and fatigue.  Ways to reduce risk for falling Examine diet and medications. Warm foods and alcohol  dilate blood vessels, which can lead to dizziness when standing. Sleep aids, antidepressants and pain medications can also increase the likelihood of a fall.  Get a vision exam. Poor vision, cataracts and glaucoma increase the chances of falling.  Check foot gear. Shoes should fit snugly and have a sturdy, nonskid sole and a broad, low heel  Participate in a physician-approved exercise program to build and maintain muscle strength and improve balance and coordination. Programs that  use ankle weights or stretch bands are excellent for muscle-strengthening. Water  aerobics programs and low-impact Tai Chi programs have also been shown to improve balance and coordination.  Increase vitamin D  intake. Vitamin D  improves muscle strength and increases the amount of calcium the body is able to absorb and deposit in bones.  How to prevent falls from common hazards Floors - Remove all loose wires, cords, and throw rugs. Minimize clutter. Make sure rugs are anchored and smooth. Keep furniture in its usual place.  Chairs -- Use chairs with straight backs, armrests and firm seats. Add firm cushions to existing pieces to add height.  Bathroom - Install grab bars and non-skid tape in the tub or shower. Use a bathtub transfer bench or a shower chair with a back support Use an elevated toilet seat and/or safety rails to assist standing from a low surface. Do not use towel racks or bathroom tissue holders to help you stand.  Lighting - Make sure halls, stairways, and entrances are well-lit. Install a night light in your bathroom or hallway. Make sure there is a light switch at the top and bottom of the staircase. Turn lights on if you get up in the middle of the night. Make sure lamps or light switches are within reach of the bed if you have to get up during the night.  Kitchen - Install non-skid rubber mats near the sink and stove. Clean spills immediately. Store frequently used utensils, pots, pans between waist and eye level. This helps prevent reaching and bending. Sit when getting things out of lower  cupboards.  Living room/ Bedrooms - Place furniture with wide spaces in between, giving enough room to move around. Establish a route through the living room that gives you something to hold onto as you walk.  Stairs - Make sure treads, rails, and rugs are secure. Install a rail on both sides of the stairs. If stairs are a threat, it might be helpful to arrange most of your activities on the  lower level to reduce the number of times you must climb the stairs.  Entrances and doorways - Install metal handles on the walls adjacent to the doorknobs of all doors to make it more secure as you travel through the doorway.  Tips for maintaining balance Keep at least one hand free at all times. Try using a backpack or fanny pack to hold things rather than carrying them in your hands. Never carry objects in both hands when walking as this interferes with keeping your balance.  Attempt to swing both arms from front to back while walking. This might require a conscious effort if Parkinson's disease has diminished your movement. It will, however, help you to maintain balance and posture, and reduce fatigue.  Consciously lift your feet off of the ground when walking. Shuffling and dragging of the feet is a common culprit in losing your balance.  When trying to navigate turns, use a U technique of facing forward and making a wide turn, rather than pivoting sharply.  Try to stand with your feet shoulder-length apart. When your feet are close together for any length of time, you increase your risk of losing your balance and falling.  Do one thing at a time. Don't try to walk and accomplish another task, such as reading or looking around. The decrease in your automatic reflexes complicates motor function, so the less distraction, the better.  Do not wear rubber or gripping soled shoes, they might catch on the floor and cause tripping.  Move slowly when changing positions. Use deliberate, concentrated movements and, if needed, use a grab bar or walking aid. Count 15 seconds between each movement. For example, when rising from a seated position, wait 15 seconds after standing to begin walking.  If balance is a continuous problem, you might want to consider a walking aid such as a cane, walking stick, or walker. Once you've mastered walking with help, you might be ready to try it on your own again.

## 2024-02-07 ENCOUNTER — Ambulatory Visit: Attending: Urology

## 2024-02-07 ENCOUNTER — Ambulatory Visit: Payer: Self-pay | Admitting: Neurology

## 2024-02-07 ENCOUNTER — Encounter: Payer: Self-pay | Admitting: Internal Medicine

## 2024-02-07 ENCOUNTER — Other Ambulatory Visit: Payer: Self-pay

## 2024-02-07 DIAGNOSIS — N3946 Mixed incontinence: Secondary | ICD-10-CM | POA: Insufficient documentation

## 2024-02-07 DIAGNOSIS — R2689 Other abnormalities of gait and mobility: Secondary | ICD-10-CM | POA: Diagnosis not present

## 2024-02-07 DIAGNOSIS — M6281 Muscle weakness (generalized): Secondary | ICD-10-CM | POA: Insufficient documentation

## 2024-02-07 DIAGNOSIS — R293 Abnormal posture: Secondary | ICD-10-CM | POA: Diagnosis not present
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
## 2024-02-08 ENCOUNTER — Ambulatory Visit: Admitting: Family Medicine

## 2024-02-08 ENCOUNTER — Encounter: Payer: Self-pay | Admitting: Family Medicine

## 2024-02-08 ENCOUNTER — Ambulatory Visit: Payer: Self-pay

## 2024-02-08 VITALS — BP 122/72 | HR 94 | Resp 16 | Ht 64.0 in | Wt 201.5 lb

## 2024-02-08 DIAGNOSIS — Z23 Encounter for immunization: Secondary | ICD-10-CM | POA: Diagnosis not present

## 2024-02-08 DIAGNOSIS — J8410 Pulmonary fibrosis, unspecified: Secondary | ICD-10-CM | POA: Diagnosis not present

## 2024-02-08 DIAGNOSIS — R053 Chronic cough: Secondary | ICD-10-CM | POA: Diagnosis not present

## 2024-02-08 DIAGNOSIS — E118 Type 2 diabetes mellitus with unspecified complications: Secondary | ICD-10-CM

## 2024-02-08 DIAGNOSIS — J411 Mucopurulent chronic bronchitis: Secondary | ICD-10-CM

## 2024-02-08 DIAGNOSIS — B3731 Acute candidiasis of vulva and vagina: Secondary | ICD-10-CM | POA: Diagnosis not present

## 2024-02-08 LAB — FOLATE: Folate: 9.9 ng/mL

## 2024-02-08 LAB — VITAMIN B12: Vitamin B-12: 602 pg/mL (ref 200–1100)

## 2024-02-08 LAB — CK: Total CK: 36 U/L (ref 20–243)

## 2024-02-08 LAB — VITAMIN B6: Vitamin B6: 9.9 ng/mL (ref 2.1–21.7)

## 2024-02-08 LAB — METHYLMALONIC ACID, SERUM: Methylmalonic Acid, Quant: 99 nmol/L (ref 69–390)

## 2024-02-08 LAB — VITAMIN B1: Vitamin B1 (Thiamine): 9 nmol/L (ref 8–30)

## 2024-02-08 MED ORDER — COVID-19 MRNA VAC-TRIS(PFIZER) 30 MCG/0.3ML IM SUSY: 0.3000 mL | PREFILLED_SYRINGE | Freq: Once | INTRAMUSCULAR | 0 refills | Status: AC

## 2024-02-08 MED ORDER — FLUCONAZOLE 150 MG PO TABS: 150.0000 mg | ORAL_TABLET | ORAL | 0 refills | Status: DC

## 2024-02-08 MED ORDER — FLUTICASONE-SALMETEROL 100-50 MCG/ACT IN AEPB: 1.0000 | INHALATION_SPRAY | Freq: Two times a day (BID) | RESPIRATORY_TRACT | 0 refills | Status: DC

## 2024-02-08 MED ORDER — BENZONATATE 200 MG PO CAPS: 200.0000 mg | ORAL_CAPSULE | Freq: Four times a day (QID) | ORAL | 0 refills | Status: DC | PRN

## 2024-02-08 NOTE — Telephone Encounter (Signed)
 FYI Only or Action Required?: FYI only for provider.  Patient was last seen in primary care on 01/31/2024 by Victoria Jon HERO, MD.  Called Nurse Triage reporting Cough and Shortness of Breath.  Symptoms began about a month ago.  Interventions attempted: Prescription medications: Z pack, yeast infection treatment/med, tessalon  .  Symptoms are: cough, SOB, post nasal drip, runny nose, sore throat, back muscle spasms gradually worsening.  Triage Disposition: See HCP Within 4 Hours (Or PCP Triage)  Patient/caregiver understands and will follow disposition?: Yes             Copied from CRM #[id number]. Topic: Clinical - Red Word Triage >> Feb 08, 2024 11:03 AM [name] wrote: [facility] that prompted transfer to Nurse Triage: some issues breathing, coughing, shortness of breath. Victoria [name] prescribed a z pac & steroids, but said i might need a longer course of antibiotics. I can't take.the trelegy, it makes me sick, nauseated.  I can tolerate the breo better. Reason for Disposition  [1] MILD difficulty breathing (e.g., minimal/no SOB at rest, SOB with walking, pulse < 100) AND [2] NEW-onset or WORSE than normal  Answer Assessment - Initial Assessment Questions Husband, [name], on phone for triage. He states patient is sleeping at this time. He states he called in for triage at this time cause he saw mychart message from provider to schedule patient.   1. RESPIRATORY STATUS: Describe your breathing? (e.g., wheezing, shortness of breath, unable to speak, severe coughing)      SOB and coughing intermittent. States she has had a lot of coughing, congestion, intermittent SOB with exertion and some wheezing.  2. ONSET: When did this breathing problem begin?      At least a month.  3. PATTERN Does the difficult breathing come and go, or has it been constant since it started?      Comes and goes, he states it seems to be related to activity.  4. SEVERITY: How bad is your breathing?  (e.g., mild, moderate, severe)      Moderate. He states not severe.  5. RECURRENT SYMPTOM: Have you had difficulty breathing before? If Yes, ask: When was the last time? and What happened that time?      Yes, chronic breathing issues including pulmonary fibrosis.  6. CARDIAC HISTORY: Do you have any history of heart disease? (e.g., heart attack, angina, bypass surgery, angioplasty)      HTN, SVT.  7. LUNG HISTORY: Do you have any history of lung disease?  (e.g., pulmonary embolus, asthma, emphysema)      Asthma  Pulmonary fibrosis (HCC)  OSA (obstructive sleep apnea)  Mild persistent asthma  Acute respiratory failure with hypoxia (HCC)   8. CAUSE: What do you think is causing the breathing problem?      He states she has not been tested for COVID so he was unsure if that could have been it. Patient has been seen twice in office in past month.  9. OTHER SYMPTOMS: Do you have any other symptoms? (e.g., chest pain, cough, dizziness, fever, runny nose)     Post nasal drip,cough, runny nose, sore throat, deep sharp back muscle spasms. Denies fever, severe difficulty breathing or respiratory distress. He states she c/o chest tightness with deep breath.  10. O2 SATURATION MONITOR:  Do you use an oxygen saturation monitor (pulse oximeter) at home? If Yes, ask: What is your reading (oxygen level) today? What is your usual oxygen saturation reading? (e.g., 95%)  No.  11. PREGNANCY: Is there any chance you are pregnant? When was your last menstrual period?       N/A.  12. TRAVEL: Have you traveled out of the country in the last month? (e.g., travel history, exposures)       No.  Protocols used: Breathing Difficulty-A-AH

## 2024-02-08 NOTE — Progress Notes (Signed)
 Name: Victoria [name]   MRN: [id number]    DOB: 03/21/1961   Date:02/08/2024       Progress Note  Subjective  Chief Complaint  Chief Complaint  Patient presents with   Cough    Some SOB due to cough, on going for about a month    Discussed the use of AI scribe software for clinical note transcription with the patient, who gave verbal consent to proceed.  History of Present Illness Victoria [name] is a 63 year old female with pulmonary fibrosis who presents with a persistent cough.  She has been experiencing a persistent cough for several months, which is productive of yellow and green phlegm, a change from her usual symptoms. She also reports significant nasal drainage and indigestion, with mucus draining from her nose and down the back of her throat. She experiences shortness of breath and wheezing, and has been seen multiple times this year for similar symptoms.  She has a history of pulmonary fibrosis and was previously diagnosed with chronic bronchitis. She has been treated with inhalers and prednisone  in the past but reports intolerance to prednisone , experiencing hyperactivity and anxiety. She was recently prescribed a withdrawal dose of prednisone  but could not tolerate it. She has been using Trelegy but reports it causes nausea and vomiting, and prefers Breo, which she tolerates better. She has not used Breo recently.  She reports a recent severe yeast infection, described as the worst she has ever experienced, with significant burning and itching. She used a vaginal suppository and cream, which provided some relief. She has not taken Diflucan  but is open to trying it.  She mentions a history of high blood pressure and possible diabetes, with a recent A1c of 6.6,and fasting glucose of over 120  She has experienced frequent urination and unintentional weight loss, which she attributes to high blood sugar levels. She is not currently on medication for diabetes but believes it may  help her symptoms.  She also reports experiencing terrible back spasms, which she attributes to a previous surgery. She has baclofen  and tizanidine at home but is cautious about drug interactions.    Patient Active Problem List   Diagnosis Date Noted   Diabetes mellitus type 2 with complications (HCC) 02/08/2024   Chronic cough 02/08/2024   Mucopurulent chronic bronchitis (HCC) 02/08/2024   Lumbar facet arthropathy 12/05/2023   Degeneration of intervertebral disc of lumbar region with discogenic back pain 12/05/2023   Cervical facet joint syndrome 12/05/2023   Obesity (BMI 30-39.9) 10/18/2023   Chronic bilateral low back pain with bilateral sciatica 10/17/2023   Spinal instability, lumbar 10/17/2023   Postoperative CSF leak 10/17/2023   Spondylolisthesis 10/17/2023   Nausea and vomiting 09/20/2022   Hyperlipidemia 09/29/2021   OSA (obstructive sleep apnea) 09/29/2021   Vitamin D  deficiency 09/29/2021   Long term (current) use of opiate analgesic 04/06/2021   Issue of repeat prescription for medication 04/06/2021   SVT (supraventricular tachycardia) (HCC) 04/09/2020   Vaginal atrophy 04/09/2020   Class 1 obesity due to excess calories with serious comorbidity and body mass index (BMI) of 31.0 to 31.9 in adult 02/07/2020   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   Chronic fatigue 01/07/2020   History of melanoma 01/07/2020   Endometriosis 01/07/2020   Pulmonary fibrosis (HCC) 12/12/2019   Nonalcoholic steatohepatitis 12/12/2019   Convulsions (HCC) 12/12/2019   Irritable bowel syndrome 12/12/2019   Osteopenia 12/12/2019   Pernicious anemia 12/12/2019   Prediabetes 12/12/2019   Traumatic brain injury (HCC) 12/12/2019  Psychosis (HCC) 11/15/2019   Chronic pain syndrome 11/15/2019   Corneal abrasion 11/15/2019   Suicidal thoughts 11/15/2019   MDD (major depressive disorder), single episode, severe with psychosis (HCC) 11/14/2019   Migraine 07/30/2019   Stage 3a chronic  kidney disease (HCC) 07/25/2019   Spinal stenosis, lumbar region, with neurogenic claudication 07/17/2019   Skin lesion 01/02/2019   Spondylolisthesis of lumbar region 04/24/2018   Numbness and tingling in both hands 12/30/2017   Cervical radicular pain 10/25/2017   Pain medication agreement 10/25/2017   Sacroiliitis (HCC) 05/03/2016   Neuropathy 01/06/2016   Right hip pain 01/06/2016   MDD (major depressive disorder), recurrent, severe, with psychosis (HCC)    Osteoarthritis of both hips 07/06/2015   Dyslipidemia 07/06/2015   Hypertension 07/06/2015   GERD (gastroesophageal reflux disease) 07/06/2015   Asthma 07/06/2015   Bipolar I disorder, most recent episode manic, severe with psychotic features (HCC) 07/06/2015   Attention-deficit hyperactivity disorder, predominantly inattentive type 02/25/2014   Elevation of levels of liver transaminase levels 01/02/2014   Impaired fasting glucose 01/02/2014   Melanosis coli 09/20/2012   Screening for colon cancer 09/07/2012   Lactose intolerance 03/06/2012   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   Mild persistent asthma 03/26/2009   Acquired absence of other genital organ(s) 10/30/2008   Hx of total hysterectomy 10/30/2008   Lateral epicondylitis, unspecified elbow 08/03/2006   Tinnitus, unspecified ear 08/11/2004    Social History   Tobacco Use   Smoking status: Former    Current packs/day: 0.00    Types: Cigarettes    Quit date: 1987    Years since quitting: 38.7   Smokeless tobacco: Never  Substance Use Topics   Alcohol  use: No    Alcohol /week: 0.0 standard drinks of alcohol      Current Outpatient Medications:    albuterol  (VENTOLIN  HFA) 108 (90 Base) MCG/ACT inhaler, Inhale 1-2 puffs into the lungs every 6 (six) hours as needed for wheezing or shortness of breath., Disp: 18 g, Rfl: 1   ARIPiprazole (ABILIFY) 5 MG tablet, Take 5 mg by mouth daily., Disp: , Rfl:    B Complex Vitamins (B-COMPLEX/B-12 PO), Take by mouth., Disp: , Rfl:     cholecalciferol  (VITAMIN D ) 25 MCG tablet, Take 1 tablet (1,000 Units total) by mouth daily., Disp: 30 tablet, Rfl: 1   estradiol  (ESTRACE  VAGINAL) 0.1 MG/GM vaginal cream, Apply 0.5mg  (pea-sized amount)  just inside the vaginal introitus with a finger-tip on Monday, Wednesday and Friday nights., Disp: 30 g, Rfl: 12   fluconazole  (DIFLUCAN ) 150 MG tablet, Take 1 tablet (150 mg total) by mouth every other day., Disp: 3 tablet, Rfl: 0   FLUoxetine  (PROZAC ) 40 MG capsule, Take by mouth every morning., Disp: , Rfl:    fluticasone -salmeterol (ADVAIR DISKUS) 100-50 MCG/ACT AEPB, Inhale 1 puff into the lungs 2 (two) times daily., Disp: 60 each, Rfl: 0   gabapentin  (NEURONTIN ) 300 MG capsule, Take 3 capsules (900 mg total) by mouth 3 (three) times daily. (Patient taking differently: Take 300 mg by mouth 3 (three) times daily.), Disp: 90 capsule, Rfl: 0   hydrocortisone  2.5 % cream, Apply topically behind ears on m-w-f at bedtime for seborrheic dermatitis, Disp: 30 g, Rfl: 11   ipratropium-albuterol  (DUONEB) 0.5-2.5 (3) MG/3ML SOLN, Take 3 mLs by nebulization 3 (three) times daily as needed (cough wheeze SOB bronchitis/asthma symptoms)., Disp: 180 mL, Rfl: 1   ketoconazole  (NIZORAL ) 2 % cream, Apply 1 Application topically daily., Disp: 15 g, Rfl: 0   ketoconazole  (NIZORAL ) 2 %  shampoo, apply 2 times per week, massage into behind ears and scalp and leave in for 5 -  10 minutes before rinsing out for seborrheic dermatitis, Disp: 120 mL, Rfl: 11   losartan  (COZAAR ) 25 MG tablet, Take 1 tablet (25 mg total) by mouth daily., Disp: 90 tablet, Rfl: 0   miconazole (MONISTAT 1 COMBINATION PACK) kit, Place 1 each vaginally once. (Patient taking differently: Place 1 each vaginally as needed.), Disp: , Rfl:    MOVANTIK  25 MG TABS tablet, Take 1 tablet (25 mg total) by mouth daily as needed (Constipation)., Disp: 90 tablet, Rfl: 1   Multiple Vitamins-Minerals (MULTIVITAMIN WITH MINERALS) tablet, Take 1 tablet by mouth  daily., Disp: , Rfl:    ondansetron  (ZOFRAN ) 8 MG tablet, Take 1 tablet (8 mg total) by mouth every 8 (eight) hours as needed for nausea or vomiting., Disp: 20 tablet, Rfl: 0   ondansetron  (ZOFRAN -ODT) 4 MG disintegrating tablet, Take 1 tablet (4 mg total) by mouth every 8 (eight) hours as needed for nausea or vomiting., Disp: 20 tablet, Rfl: 0   Oxycodone  HCl 10 MG TABS, Take 1 tablet (10 mg total) by mouth every 6 (six) hours as needed (severe pain)., Disp: 20 tablet, Rfl: 0   pantoprazole  (PROTONIX ) 40 MG tablet, TAKE 1 TABLET BY MOUTH EVERY DAY IN THE MORNING, Disp: 90 tablet, Rfl: 0   polyvinyl alcohol  (LIQUIFILM TEARS) 1.4 % ophthalmic solution, Place 1 drop into both eyes 3 (three) times daily. (Patient taking differently: Place 1 drop into both eyes as needed.), Disp: 15 mL, Rfl: 1   QUEtiapine  (SEROQUEL ) 100 MG tablet, Take 1 tablet (100 mg total) by mouth 2 (two) times daily., Disp: 60 tablet, Rfl: 1   SUMAtriptan  (IMITREX ) 100 MG tablet, Take 1 tablet (100 mg total) by mouth once as needed for migraine. May repeat in 2 hours if headache persists or recurs., Disp: 30 tablet, Rfl: 2   venlafaxine  XR (EFFEXOR -XR) 75 MG 24 hr capsule, Take 75 mg by mouth daily., Disp: , Rfl:    benzonatate  (TESSALON ) 200 MG capsule, Take 1 capsule (200 mg total) by mouth every 6 (six) hours as needed for cough., Disp: 90 capsule, Rfl: 0  Allergies  Allergen Reactions   Acetaminophen  Other (See Comments)    Liver issues   Atorvastatin     Myalgias    Sertraline Hives and Shortness Of Breath   Cefuroxime Hives    Has tolerated 1st generation cephalosporin (CEPHALEXIN) in the past with no documented ADRs.    Codeine Hives   Tape     Adhesive tape    Amoxicillin Rash   Penicillins Rash    Has tolerated 1st generation cephalosporin (CEPHALEXIN) in the past with no documented ADRs.    Sulfa Antibiotics Rash    ROS  Ten systems reviewed and is negative except as mentioned in HPI     Objective  Vitals:   02/08/24 1358  BP: 122/72  Pulse: 94  Resp: 16  SpO2: 96%  Weight: 201 lb 8 oz (91.4 kg)  Height: 5' 4 (1.626 m)    Body mass index is 34.59 kg/m.    Physical Exam CONSTITUTIONAL: Patient appears well-developed and well-nourished. No distress. HEENT: Head atraumatic, normocephalic, neck supple. CARDIOVASCULAR: Normal rate, regular rhythm and normal heart sounds. No murmur heard. No BLE edema. PULMONARY: Effort normal and breath sounds normal. No respiratory distress. No wheeze, wet cough. ABDOMINAL: There is no tenderness or distention. MUSCULOSKELETAL: Normal gait. Without gross motor or sensory deficit.  PSYCHIATRIC: Patient has a normal mood and affect. Behavior is normal. Judgment and thought content normal.  Recent Results (from the past 2160 hours)  BLADDER SCAN AMB NON-IMAGING     Status: None   Collection Time: 11/10/23  4:11 PM  Result Value Ref Range   Scan Result 1 ml   HgB A1c     Status: Abnormal   Collection Time: 11/29/23  3:34 PM  Result Value Ref Range   Hgb A1c MFr Bld 6.2 (H) <5.7 %    Comment: For someone without known diabetes, a hemoglobin  A1c value between 5.7% and 6.4% is consistent with prediabetes and should be confirmed with a  follow-up test. . For someone with known diabetes, a value <7% indicates that their diabetes is well controlled. A1c targets should be individualized based on duration of diabetes, age, comorbid conditions, and other considerations. . This assay result is consistent with an increased risk of diabetes. . Currently, no consensus exists regarding use of hemoglobin A1c for diagnosis of diabetes for children. .    Mean Plasma Glucose 131 mg/dL   eAG (mmol/L) 7.3 mmol/L  Comprehensive Metabolic Panel (CMET)     Status: Abnormal   Collection Time: 11/29/23  3:34 PM  Result Value Ref Range   Glucose, Bld 111 (H) 65 - 99 mg/dL    Comment: .            Fasting reference interval . For  someone without known diabetes, a glucose value between 100 and 125 mg/dL is consistent with prediabetes and should be confirmed with a follow-up test. .    BUN 12 7 - 25 mg/dL   Creat 9.06 9.49 - 8.94 mg/dL   eGFR 69 > OR = 60 fO/fpw/8.26f7   BUN/Creatinine Ratio SEE NOTE: 6 - 22 (calc)    Comment:    Not Reported: BUN and Creatinine are within    reference range. .    Sodium 139 135 - 146 mmol/L   Potassium 4.5 3.5 - 5.3 mmol/L   Chloride 102 98 - 110 mmol/L   CO2 28 20 - 32 mmol/L   Calcium 9.6 8.6 - 10.4 mg/dL   Total Protein 7.4 6.1 - 8.1 g/dL   Albumin 4.2 3.6 - 5.1 g/dL   Globulin 3.2 1.9 - 3.7 g/dL (calc)   AG Ratio 1.3 1.0 - 2.5 (calc)   Total Bilirubin 0.4 0.2 - 1.2 mg/dL   Alkaline phosphatase (APISO) 138 37 - 153 U/L   AST 40 (H) 10 - 35 U/L   ALT 42 (H) 6 - 29 U/L  Antinuclear Antib (ANA)     Status: Abnormal   Collection Time: 11/29/23  3:34 PM  Result Value Ref Range   Anti Nuclear Antibody (ANA) POSITIVE (A) NEGATIVE    Comment: ANA IFA is a first line screen for detecting the presence of up to approximately 150 autoantibodies in various autoimmune diseases. A positive ANA IFA result is suggestive of autoimmune disease and reflexes to titer and pattern. Further laboratory testing may be considered if clinically indicated. . For additional information, please refer to http://education.[facility].com/faq/FAQ177 (This link is being provided for informational/ educational purposes only.) .   C-reactive protein     Status: Abnormal   Collection Time: 11/29/23  3:34 PM  Result Value Ref Range   CRP 9.7 (H) <8.0 mg/L  Sed Rate (ESR)     Status: Abnormal   Collection Time: 11/29/23  3:34 PM  Result Value Ref Range   Sed  Rate 43 (H) 0 - 30 mm/h  Anti-nuclear ab-titer (ANA titer)     Status: Abnormal   Collection Time: 11/29/23  3:34 PM  Result Value Ref Range   ANA Titer 1 1:40 (H) titer    Comment: A low level ANA titer may be present in  pre-clinical autoimmune diseases and normal individuals.                 Reference Range                 <1:40        Negative                 1:40-1:80    Low Antibody Level                 >1:80        Elevated Antibody Level .    ANA Pattern 1 Mitotic, Centrosome (A)     Comment: Distinct centrioles (1-2/cell) in cytoplasm and at the poles of mitotic spindle. Pattern is rare in systemic sclerosis, Raynaud's phenomenon, and some infections (viral and mycoplasma). . AC-24: Centrosome . International Consensus on ANA Patterns (SeverTies.uy)   Compliance Drug Analysis, Ur     Status: None   Collection Time: 12/05/23  3:49 PM  Result Value Ref Range   Summary FINAL     Comment: ==================================================================== Compliance Drug Analysis, Ur ==================================================================== Test                             Result       Flag       Units  Drug Present and Declared for Prescription Verification   Oxycodone                       647          EXPECTED   ng/mg creat   Oxymorphone                    974          EXPECTED   ng/mg creat   Noroxycodone                   2747         EXPECTED   ng/mg creat   Noroxymorphone                 410          EXPECTED   ng/mg creat    Sources of oxycodone  are scheduled prescription medications.    Oxymorphone, noroxycodone, and noroxymorphone are expected    metabolites of oxycodone . Oxymorphone is also available as a    scheduled prescription medication.    Gabapentin                      PRESENT      EXPECTED   Trazodone                       PRESENT      EXPECTED   1,3 chlorophenyl piperazine    PRESENT      EXPECTED    1,3-chlorophenyl piper azine is an expected metabolite of trazodone .    Quetiapine                      PRESENT      EXPECTED  Drug Present  not Declared for Prescription Verification   Baclofen                        PRESENT       UNEXPECTED   Acetaminophen                   PRESENT      UNEXPECTED   Dextrorphan/Levorphanol        PRESENT      UNEXPECTED    Dextrorphan is an expected metabolite of dextromethorphan, an over-    the-counter or prescription cough suppressant. Levorphanol is a    scheduled prescription medication. Dextrorphan cannot be    distinguished from levorphanol by the method used for analysis.  Drug Absent but Declared for Prescription Verification   Duloxetine                      Not Detected UNEXPECTED ==================================================================== Test                      Result    Flag   Units      Ref Range   Creatinine              187              mg/dL      >=79 ==================================================================== Declared Med ications:  The flagging and interpretation on this report are based on the  following declared medications.  Unexpected results may arise from  inaccuracies in the declared medications.   **Note: The testing scope of this panel includes these medications:   Duloxetine  (Cymbalta )  Gabapentin  (Neurontin )  Oxycodone   Quetiapine  (Seroquel )  Trazodone  (Desyrel )   **Note: The testing scope of this panel does not include the  following reported medications:   Albuterol  (Ventolin  HFA)  Albuterol  (Duoneb)  Benzonatate  (Tessalon )  Cholecalciferol   Eye Drops  Ipratropium (Duoneb)  Ketoconazole  (Nizoral )  Losartan  (Cozaar )  Miconazole  Multivitamin  Naloxegol  (Movantik )  Ondansetron  (Zofran )  Pantoprazole  (Protonix )  Sumatriptan  (Imitrex )  Vitamin B ==================================================================== For clinical consultation, please call [phone]. ====================================================================   Bladder Scan (Post Void Residual) in office     Status: None   Collection Time: 12/20/23  2:42 PM  Result Value Ref Range   Scan Result 11ml   Lab report - scanned     Status:  None   Collection Time: 12/24/23 12:00 AM  Result Value Ref Range   EGFR 69.0     Comment: ABSTRACTED BY HIM  CBC with Differential/Platelet     Status: None   Collection Time: 01/20/24  1:54 PM  Result Value Ref Range   WBC 7.2 3.8 - 10.8 Thousand/uL   RBC 4.36 3.80 - 5.10 Million/uL   Hemoglobin 13.1 11.7 - 15.5 g/dL   HCT 59.7 64.9 - 54.9 %   MCV 92.2 80.0 - 100.0 fL   MCH 30.0 27.0 - 33.0 pg   MCHC 32.6 32.0 - 36.0 g/dL    Comment: For adults, a slight decrease in the calculated MCHC value (in the range of 30 to 32 g/dL) is most likely not clinically significant; however, it should be interpreted with caution in correlation with other red cell parameters and the patient's clinical condition.    RDW 13.5 11.0 - 15.0 %   Platelets 229 140 - 400 Thousand/uL   MPV 11.5 7.5 - 12.5 fL   Neutro Abs 4,493 1,500 - 7,800 cells/uL   Absolute Lymphocytes 1,951 850 -  3,900 cells/uL   Absolute Monocytes 547 200 - 950 cells/uL   Eosinophils Absolute 166 15 - 500 cells/uL   Basophils Absolute 43 0 - 200 cells/uL   Neutrophils Relative % 62.4 %   Total Lymphocyte 27.1 %   Monocytes Relative 7.6 %   Eosinophils Relative 2.3 %   Basophils Relative 0.6 %  Comprehensive metabolic panel with GFR     Status: Abnormal   Collection Time: 01/20/24  1:54 PM  Result Value Ref Range   Glucose, Bld 167 (H) 65 - 99 mg/dL    Comment: .            Fasting reference interval . For someone without known diabetes, a glucose value >125 mg/dL indicates that they may have diabetes and this should be confirmed with a follow-up test. .    BUN 11 7 - 25 mg/dL   Creat 9.14 9.49 - 8.94 mg/dL   eGFR 77 > OR = 60 fO/fpw/8.26f7   BUN/Creatinine Ratio SEE NOTE: 6 - 22 (calc)    Comment:    Not Reported: BUN and Creatinine are within    reference range. .    Sodium 138 135 - 146 mmol/L   Potassium 4.6 3.5 - 5.3 mmol/L   Chloride 104 98 - 110 mmol/L   CO2 26 20 - 32 mmol/L   Calcium 9.3 8.6 - 10.4  mg/dL   Total Protein 7.2 6.1 - 8.1 g/dL   Albumin 4.2 3.6 - 5.1 g/dL   Globulin 3.0 1.9 - 3.7 g/dL (calc)   AG Ratio 1.4 1.0 - 2.5 (calc)   Total Bilirubin 0.5 0.2 - 1.2 mg/dL   Alkaline phosphatase (APISO) 137 37 - 153 U/L   AST 74 (H) 10 - 35 U/L   ALT 81 (H) 6 - 29 U/L  Cervicovaginal ancillary only     Status: Abnormal   Collection Time: 01/31/24  1:43 PM  Result Value Ref Range   Neisseria Gonorrhea Negative    Chlamydia Negative    Trichomonas Negative    Bacterial Vaginitis (gardnerella) Negative    Candida Vaginitis Positive (A)    Candida Glabrata Negative    Comment      Normal Reference Range Bacterial Vaginosis - Negative   Comment Normal Reference Ranger Chlamydia - Negative    Comment      Normal Reference Range Neisseria Gonorrhea - Negative   Comment Normal Reference Range Candida Species - Negative    Comment Normal Reference Range Candida Galbrata - Negative    Comment Normal Reference Range Trichomonas - Negative   Vitamin B1     Status: None   Collection Time: 02/03/24  2:15 PM  Result Value Ref Range   Vitamin B1 (Thiamine) 9 8 - 30 nmol/L    Comment: (Note) Vitamin supplementation within 24 hours prior to blood  draw may affect the accuracy of the results. . This test was developed and its analytical performance  characteristics have been determined by [employer]. It has not been cleared or approved by FDA.  This assay has been validated pursuant to the CLIA  regulations and is used for clinical purposes. . [facility] [address] [place] [address] [phone] Johanna Agent L. Gino, MD, PhD   Vitamin B12     Status: None   Collection Time: 02/03/24  2:15 PM  Result Value Ref Range   Vitamin B-12 602 200 - 1,100 pg/mL  Vitamin B6  Status: None   Collection Time: 02/03/24  2:15 PM  Result Value Ref Range   Vitamin B6 9.9 2.1 - 21.7 ng/mL    Comment: (Note) Vitamin supplementation within 24 hours  prior to blood  draw may affect the accuracy of results. . This test was developed and its analytical performance  characteristics have been determined by [facility]. It has not been cleared or approved by the  FDA. This assay has been validated pursuant to the CLIA  regulations and is used for clinical purposes. . [facility] [address] [place] [address] [phone] Johanna Agent L. Gino, MD, PhD   CK     Status: None   Collection Time: 02/03/24  2:15 PM  Result Value Ref Range   Total CK 36 20 - 243 U/L  Folate     Status: None   Collection Time: 02/03/24  2:15 PM  Result Value Ref Range   Folate 9.9 ng/mL    Comment:                            Reference Range                            Low:           <3.4                            Borderline:    3.4-5.4                            Normal:        >5.4 .   Methylmalonic acid, serum     Status: None   Collection Time: 02/03/24  2:15 PM  Result Value Ref Range   Methylmalonic Acid, Quant 99 69 - 390 nmol/L    Comment: . Serum methylmalonic acid (MMA) levels are used to diagnose and monitor several rare inborn errors of metabolism, including methylmalonic aciduria. The enzymatic conversion of MMA to succinic acid requires vitamin B12 (adenosyl-cobalamin) as a cofactor. Serum MMA levels are also used for assessing functional vitamin B12 deficiency. Vitamin B12 is essential for fetal neurodevelopment, particularly early in pregnancy. Undiagnosed maternal vitamin B12 deficiency may be associated with adverse fetal/neonatal outcomes, such as neural tube defects and intrauterine growth restriction. . [facility] utilized Multi-Modal Decomposition (MMD) analysis to establish first and second trimester- specific MMA reference intervals in pregnancy, as given below: MMA, First trimester (<[redacted] wks gestation): 58-167 nmol/L MMA, Second trimester (13-[redacted] wks gestation): 63-241  nmol/L . This test was developed and its analytical performance characteristics have been determined by [employer]. It has not been cleared or approved by the FDA. This assay has been validated pursuant to the CLIA regulations and is used for clinical purposes. .       Assessment & Plan Chronic mucopurulent bronchitis with chronic cough and pulmonary fibrosis Chronic mucopurulent bronchitis with persistent productive cough and pulmonary fibrosis. Intolerant to Trelegy. Chronic inflammation suspected. - Use existing Breo inhaler at home. - Prescribe Advair or Wixela as cost-effective alternatives since Breo not covered . - Refer to pulmonologist for further evaluation. - Prescribe benzonatate  200 mg for cough management.  Type 2 diabetes mellitus  Suspected type 2 diabetes mellitus with elevated HbA1c , fasting glucose and symptoms  of polyuria and weight loss. Previous HbA1c readings suggest prediabetes. - Discuss diabetes management and treatment options with Doctor Bernardo. - Advise dietary modifications to reduce sugar and carbohydrate intake. - Consider referral to a dietitian if needed.  Acute vulvovaginal candidiasis Severe vulvovaginal candidiasis. Previous vaginal cream provided temporary relief. - Prescribe Diflucan  every other day for three doses. - Advise dietary modifications to reduce sugar intake to prevent recurrent infections.

## 2024-02-12 ENCOUNTER — Other Ambulatory Visit: Payer: Self-pay | Admitting: Internal Medicine

## 2024-02-13 DIAGNOSIS — K5909 Other constipation: Secondary | ICD-10-CM | POA: Diagnosis not present

## 2024-02-13 DIAGNOSIS — R634 Abnormal weight loss: Secondary | ICD-10-CM | POA: Diagnosis not present

## 2024-02-13 DIAGNOSIS — R1319 Other dysphagia: Secondary | ICD-10-CM | POA: Diagnosis not present

## 2024-02-13 DIAGNOSIS — K7581 Nonalcoholic steatohepatitis (NASH): Secondary | ICD-10-CM | POA: Diagnosis not present

## 2024-02-14 ENCOUNTER — Ambulatory Visit

## 2024-02-14 ENCOUNTER — Other Ambulatory Visit: Payer: Self-pay

## 2024-02-14 DIAGNOSIS — M6281 Muscle weakness (generalized): Secondary | ICD-10-CM

## 2024-02-14 DIAGNOSIS — N3946 Mixed incontinence: Secondary | ICD-10-CM

## 2024-02-14 DIAGNOSIS — R293 Abnormal posture: Secondary | ICD-10-CM | POA: Diagnosis not present

## 2024-02-14 DIAGNOSIS — R2689 Other abnormalities of gait and mobility: Secondary | ICD-10-CM

## 2024-02-14 NOTE — Telephone Encounter (Signed)
 Requested Prescriptions  Pending Prescriptions Disp Refills   pantoprazole  (PROTONIX ) 40 MG tablet [Pharmacy Med Name: PANTOPRAZOLE  SOD DR 40 MG TAB] 90 tablet 0    Sig: TAKE 1 TABLET BY MOUTH EVERY DAY IN THE MORNING     Gastroenterology: Proton Pump Inhibitors Passed - 02/14/2024 10:52 AM      Passed - Valid encounter within last 12 months    Recent Outpatient Visits           6 days ago Pulmonary fibrosis [facility])    [facility] Glenard Mire, MD   2 weeks ago Pulmonary fibrosis [facility])    [facility] Myrla Jon HERO, MD   3 weeks ago Chronic bronchitis, unspecified chronic bronchitis type [facility])   [facility] Gareth Mliss FALCON, FNP   2 months ago Opacity of lung on imaging study   [facility] Bernardo Fend, DO   2 months ago Dizziness   [facility] Sowles, Krichna, MD       Future Appointments             In 1 week Bernardo Fend, DO [facility] [place]   In 1 month McGowan, Clotilda DELENA RIGGERS [facility] [place]   In 2 months Hester Alm BROCKS, MD [facility]   In 11 months Hester Alm BROCKS, MD [facility]
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
## 2024-02-16 ENCOUNTER — Ambulatory Visit
Admission: RE | Admit: 2024-02-16 | Discharge: 2024-02-16 | Disposition: A | Discharge status: Home / Self Care | Source: Ambulatory Visit | Attending: Internal Medicine | Admitting: Internal Medicine

## 2024-02-16 DIAGNOSIS — Z1231 Encounter for screening mammogram for malignant neoplasm of breast: Secondary | ICD-10-CM | POA: Insufficient documentation

## 2024-02-20 ENCOUNTER — Ambulatory Visit: Payer: Self-pay | Admitting: Internal Medicine

## 2024-02-20 ENCOUNTER — Encounter: Payer: Self-pay | Admitting: Neurosurgery

## 2024-02-21 ENCOUNTER — Ambulatory Visit

## 2024-02-21 ENCOUNTER — Other Ambulatory Visit: Payer: Self-pay

## 2024-02-21 DIAGNOSIS — R293 Abnormal posture: Secondary | ICD-10-CM

## 2024-02-21 DIAGNOSIS — N3946 Mixed incontinence: Secondary | ICD-10-CM

## 2024-02-21 DIAGNOSIS — R2689 Other abnormalities of gait and mobility: Secondary | ICD-10-CM

## 2024-02-21 DIAGNOSIS — M6281 Muscle weakness (generalized): Secondary | ICD-10-CM
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
## 2024-02-22 ENCOUNTER — Other Ambulatory Visit: Payer: Self-pay

## 2024-02-22 ENCOUNTER — Encounter: Payer: Self-pay | Admitting: Gastroenterology

## 2024-02-22 DIAGNOSIS — F411 Generalized anxiety disorder: Secondary | ICD-10-CM | POA: Diagnosis not present

## 2024-02-22 DIAGNOSIS — F4312 Post-traumatic stress disorder, chronic: Secondary | ICD-10-CM | POA: Diagnosis not present

## 2024-02-22 DIAGNOSIS — F410 Panic disorder [episodic paroxysmal anxiety] without agoraphobia: Secondary | ICD-10-CM | POA: Diagnosis not present

## 2024-02-22 DIAGNOSIS — F5105 Insomnia due to other mental disorder: Secondary | ICD-10-CM | POA: Diagnosis not present

## 2024-02-22 DIAGNOSIS — F333 Major depressive disorder, recurrent, severe with psychotic symptoms: Secondary | ICD-10-CM | POA: Diagnosis not present

## 2024-02-24 ENCOUNTER — Ambulatory Visit (INDEPENDENT_AMBULATORY_CARE_PROVIDER_SITE_OTHER): Admitting: Internal Medicine

## 2024-02-24 ENCOUNTER — Encounter: Payer: Self-pay | Admitting: Internal Medicine

## 2024-02-24 ENCOUNTER — Other Ambulatory Visit: Payer: Self-pay | Admitting: Neurosurgery

## 2024-02-24 VITALS — BP 128/80 | HR 93 | Resp 16 | Ht 64.0 in | Wt 197.0 lb

## 2024-02-24 DIAGNOSIS — M5412 Radiculopathy, cervical region: Secondary | ICD-10-CM

## 2024-02-24 DIAGNOSIS — Z7984 Long term (current) use of oral hypoglycemic drugs: Secondary | ICD-10-CM

## 2024-02-24 DIAGNOSIS — E118 Type 2 diabetes mellitus with unspecified complications: Secondary | ICD-10-CM | POA: Diagnosis not present

## 2024-02-24 DIAGNOSIS — Z23 Encounter for immunization: Secondary | ICD-10-CM

## 2024-02-24 DIAGNOSIS — M542 Cervicalgia: Secondary | ICD-10-CM

## 2024-02-24 DIAGNOSIS — E8941 Symptomatic postprocedural ovarian failure: Secondary | ICD-10-CM | POA: Diagnosis not present

## 2024-02-24 DIAGNOSIS — J4530 Mild persistent asthma, uncomplicated: Secondary | ICD-10-CM | POA: Diagnosis not present

## 2024-02-24 MED ORDER — METFORMIN HCL 500 MG PO TABS: 500.0000 mg | ORAL_TABLET | Freq: Every day | ORAL | 1 refills | Status: DC

## 2024-02-24 NOTE — Progress Notes (Signed)
 Established Patient Office Visit  Subjective    Patient ID: Victoria [name], female    DOB: 1960-07-12  Age: 63 y.o. MRN: [id number]  CC:  Chief Complaint  Patient presents with   Medical Management of Chronic Issues    HPI BLAKLEY MICHNA presents to for follow up on chronic medical conditions. She is here with her husband.   Discussed the use of AI scribe software for clinical note transcription with the patient, who gave verbal consent to proceed.  History of Present Illness Victoria [name] is a 63 year old female who presents with concerns about her blood sugar levels and related symptoms.  She experiences polyuria, polydipsia, weight loss, and severe fatigue, which are associated with hyperglycemia. Her hemoglobin A1c was 6.6% in February. Fasting blood glucose levels have been 126 mg/dL and 871 mg/dL, with a non-fasting level of 163 mg/dL in September of the previous year. Her father had diabetes. She is training for a 5K and exercises three times a day.  She occasionally uses Movantik  for constipation, which is effective. She experiences abdominal pain and emesis, which she attributes to her medications, including oxycodone .  She has bronchitis symptoms, including hoarseness and yellowish sputum. She uses Breo as her maintenance inhaler, which is effective despite an unpleasant taste. She was previously prescribed Advair but did not fill the prescription.  She underwent surgical menopause following the removal of her ovaries and cervix in 2013. She experiences excessive sweating and burning sensations. She uses Estrace  vaginally but feels she may need additional hormonal support.   Outpatient Encounter Medications as of 02/24/2024  Medication Sig   albuterol  (VENTOLIN  HFA) 108 (90 Base) MCG/ACT inhaler Inhale 1-2 puffs into the lungs every 6 (six) hours as needed for wheezing or shortness of breath.   ARIPiprazole (ABILIFY) 5 MG tablet Take 5 mg by mouth daily.   B  Complex Vitamins (B-COMPLEX/B-12 PO) Take by mouth.   benzonatate  (TESSALON ) 200 MG capsule Take 1 capsule (200 mg total) by mouth every 6 (six) hours as needed for cough.   buprenorphine (BUTRANS) 7.5 MCG/HR Place 1 patch onto the skin once a week.   cholecalciferol  (VITAMIN D ) 25 MCG tablet Take 1 tablet (1,000 Units total) by mouth daily.   estradiol  (ESTRACE  VAGINAL) 0.1 MG/GM vaginal cream Apply 0.5mg  (pea-sized amount)  just inside the vaginal introitus with a finger-tip on Monday, Wednesday and Friday nights.   fluconazole  (DIFLUCAN ) 150 MG tablet Take 1 tablet (150 mg total) by mouth every other day.   FLUoxetine  (PROZAC ) 40 MG capsule Take by mouth every morning.   fluticasone -salmeterol (ADVAIR DISKUS) 100-50 MCG/ACT AEPB Inhale 1 puff into the lungs 2 (two) times daily.   gabapentin  (NEURONTIN ) 300 MG capsule Take 3 capsules (900 mg total) by mouth 3 (three) times daily. (Patient taking differently: Take 300 mg by mouth 3 (three) times daily.)   hydrocortisone  2.5 % cream Apply topically behind ears on m-w-f at bedtime for seborrheic dermatitis   ipratropium-albuterol  (DUONEB) 0.5-2.5 (3) MG/3ML SOLN Take 3 mLs by nebulization 3 (three) times daily as needed (cough wheeze SOB bronchitis/asthma symptoms).   ketoconazole  (NIZORAL ) 2 % cream Apply 1 Application topically daily.   ketoconazole  (NIZORAL ) 2 % shampoo apply 2 times per week, massage into behind ears and scalp and leave in for 5 -  10 minutes before rinsing out for seborrheic dermatitis   losartan  (COZAAR ) 25 MG tablet Take 1 tablet (25 mg total) by mouth daily.   miconazole (MONISTAT 1  COMBINATION PACK) kit Place 1 each vaginally once. (Patient taking differently: Place 1 each vaginally as needed.)   MOVANTIK  25 MG TABS tablet Take 1 tablet (25 mg total) by mouth daily as needed (Constipation).   Multiple Vitamins-Minerals (MULTIVITAMIN WITH MINERALS) tablet Take 1 tablet by mouth daily.   ondansetron  (ZOFRAN ) 8 MG tablet Take 1  tablet (8 mg total) by mouth every 8 (eight) hours as needed for nausea or vomiting.   ondansetron  (ZOFRAN -ODT) 4 MG disintegrating tablet Take 1 tablet (4 mg total) by mouth every 8 (eight) hours as needed for nausea or vomiting.   Oxycodone  HCl 10 MG TABS Take 1 tablet (10 mg total) by mouth every 6 (six) hours as needed (severe pain).   pantoprazole  (PROTONIX ) 40 MG tablet TAKE 1 TABLET BY MOUTH EVERY DAY IN THE MORNING   polyvinyl alcohol  (LIQUIFILM TEARS) 1.4 % ophthalmic solution Place 1 drop into both eyes 3 (three) times daily. (Patient taking differently: Place 1 drop into both eyes as needed.)   QUEtiapine  (SEROQUEL ) 100 MG tablet Take 1 tablet (100 mg total) by mouth 2 (two) times daily.   SUMAtriptan  (IMITREX ) 100 MG tablet Take 1 tablet (100 mg total) by mouth once as needed for migraine. May repeat in 2 hours if headache persists or recurs.   venlafaxine  XR (EFFEXOR -XR) 75 MG 24 hr capsule Take 75 mg by mouth daily.   FLUoxetine  (PROZAC ) 20 MG capsule Take 20 mg by mouth daily.   No facility-administered encounter medications on file as of 02/24/2024.    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   right forearm pathology report scanned refer to media   Melanocarcinoma [facility])    Osteoarthritis of both hips    Pre-diabetes    Psychotic disorder (HCC)    Pulmonary fibrosis (HCC)    Sleep apnea    SVT (supraventricular tachycardia)     Past Surgical History:  Procedure Laterality Date   ABDOMINAL HYSTERECTOMY     APPENDECTOMY  2006   APPLICATION OF INTRAOPERATIVE CT SCAN N/A 10/17/2023   Procedure: APPLICATION OF INTRAOPERATIVE CT SCAN;  Surgeon: Clois Fret, MD;  Location: [facility] ORS;  Service:  Neurosurgery;  Laterality: N/A;   BREAST EXCISIONAL BIOPSY Right 1997   neg   ESOPHAGOGASTRODUODENOSCOPY (EGD) WITH PROPOFOL  N/A 09/20/2022   Procedure: ESOPHAGOGASTRODUODENOSCOPY (EGD) WITH PROPOFOL ;  Surgeon: Therisa Bi, MD;  Location: [facility] ENDOSCOPY;  Service: Gastroenterology;  Laterality: N/A;   excision of melanoma on Right arm     HIATAL HERNIA REPAIR     Jan 2017   LAPAROSCOPY  2020   TRANSFORAMINAL LUMBAR INTERBODY FUSION W/ MIS 1 LEVEL N/A 10/17/2023   Procedure: L4-5 MINIMALLY INVASIVE (MIS) TRANSFORAMINAL LUMBAR INTERBODY FUSION (TLIF);  Surgeon: Clois Fret, MD;  Location: [facility] ORS;  Service: Neurosurgery;  Laterality: N/A;  L4-5 MINIMALLY INVASIVE (MIS) TRANSFORAMINAL LUMBAR INTERBODY FUSION (TLIF)    Family History  Problem Relation Age of Onset   Alcohol  abuse Mother    Arthritis Mother    Heart disease Mother    Liver disease Mother    Osteoporosis Mother    Clotting disorder Mother    Diabetes Father  Arthritis Father    Stroke Father    Sarcoidosis Father    Heart failure Father    Bipolar disorder Sister    Schizophrenia Sister    Depression Sister    Anxiety disorder Sister    Alcohol  abuse Sister    Diabetes Sister    Arthritis Sister    Thyroid  disease Sister    Lupus Sister    Depression Sister    Anxiety disorder Sister    Alcohol  abuse Sister    Depression Sister    Anxiety disorder Sister    Ehlers-Danlos syndrome Daughter    Breast cancer Neg Hx     Social History   Socioeconomic History   Marital status: Married    Spouse name: bruce   Number of children: 1   Years of education: Not on file   Highest education level: Associate degree: academic program  Occupational History   Occupation: home maker  Tobacco Use   Smoking status: Former    Current packs/day: 0.00    Types: Cigarettes    Quit date: 1987    Years since quitting: 38.7   Smokeless tobacco: Never  Vaping Use   Vaping status: Never Used  Substance and Sexual  Activity   Alcohol  use: No    Alcohol /week: 0.0 standard drinks of alcohol    Drug use: No    Comment: No use of illict drugs.   Sexual activity: Not Currently  Other Topics Concern   Not on file  Social History Narrative   Lives with husband at home.   Are you right handed or left handed? Left   Are you currently employed ?    What is your current occupation? Retired/disabled   Do you live at home alone?   Who lives with you? husband   What type of home do you live in: 1 story or 2 story? two    Caffeine  3 cokes   Social Drivers of Corporate investment banker Strain: Medium Risk (02/13/2024)   Received from [facility]   Overall Financial Resource Strain (CARDIA)    Difficulty of Paying Living Expenses: Somewhat hard  Food Insecurity: Food Insecurity Present (02/13/2024)   Received from [facility]   Hunger Vital Sign    Within the past 12 months, you worried that your food would run out before you got the money to buy more.: Sometimes true    Within the past 12 months, the food you bought just didn't last and you didn't have money to get more.: Sometimes true  Transportation Needs: No Transportation Needs (02/13/2024)   Received from [facility] - Transportation    In the past 12 months, has lack of transportation kept you from medical appointments or from getting medications?: No    Lack of Transportation (Non-Medical): No  Physical Activity: Insufficiently Active (08/25/2023)   Exercise Vital Sign    Days of Exercise per Week: 7 days    Minutes of Exercise per Session: 20 min  Stress: No Stress Concern Present (08/25/2023)   [employer] of Occupational Health - Occupational Stress Questionnaire    Feeling of Stress : Only a little  Social Connections: Moderately Isolated (08/25/2023)   Social Connection and Isolation Panel    Frequency of Communication with Friends and Family: Once a week    Frequency of  Social Gatherings with Friends and Family: Never    Attends Religious Services: 1 to 4 times per year  Active Member of Clubs or Organizations: No    Attends Banker Meetings: Never    Marital Status: Married  Catering manager Violence: Not At Risk (10/19/2023)   Humiliation, Afraid, Rape, and Kick questionnaire    Fear of Current or Ex-Partner: No    Emotionally Abused: No    Physically Abused: No    Sexually Abused: No    Review of Systems  Unable to perform ROS: Other  PER HPI    Objective    BP 128/80   Pulse 93   Resp 16   Ht 5' 4 (1.626 m)   Wt 197 lb (89.4 kg)   SpO2 96%   BMI 33.81 kg/m   Physical Exam Constitutional:      Appearance: Normal appearance.  HENT:     Head: Normocephalic and atraumatic.  Eyes:     Conjunctiva/sclera: Conjunctivae normal.  Cardiovascular:     Rate and Rhythm: Normal rate and regular rhythm.  Pulmonary:     Effort: Pulmonary effort is normal.     Breath sounds: Normal breath sounds. No wheezing, rhonchi or rales.  Skin:    General: Skin is warm and dry.  Neurological:     General: No focal deficit present.     Mental Status: She is alert. Mental status is at baseline.  Psychiatric:        Mood and Affect: Mood normal.        Behavior: Behavior normal.       Assessment & Plan:   Assessment & Plan Type 2 diabetes mellitus, newly diagnosed, with initiation of metformin  therapy Newly diagnosed type 2 diabetes mellitus due to symptoms and previous A1c 6.6%, last checked in July and had improved to 6.2%. Symptoms indicate potential insulin  resistance. Metformin  chosen for efficacy and cost-effectiveness. Discussed potential side effects, including gastrointestinal upset and diarrhea, which may benefit her constipation. Insulin  not currently necessary. - Initiate metformin  500 mg once daily. - Order urine test to assess kidney function. - Schedule follow-up in 3 months to recheck A1c and assess metformin   efficacy.  Chronic bronchitis with persistent symptoms on inhaler therapy Chronic bronchitis with persistent symptoms despite Breo inhaler. No indication for antibiotics or steroids. Pulmonology appointment scheduled for further evaluation. - Continue Breo inhaler. - Attend pulmonology appointment in two weeks.  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 Currently using vaginal Estrace  but may need additional hormonal support. - Refer to gynecology for hormone therapy evaluation.  Constipation, stable, monitoring with current medications Constipation is being managed. Metformin  initiation may help alleviate constipation due to gastrointestinal side effects. Doing well on Movantik .  Recurrent vulvovaginal candidiasis, improved, monitoring Recurrent vulvovaginal candidiasis has improved with recent treatment.  - Urine Microalbumin w/creat. ratio - metFORMIN  (GLUCOPHAGE ) 500 MG tablet; Take 1 tablet (500 mg total) by mouth daily with breakfast.  Dispense: 90 tablet; Refill: 1 - fluticasone  furoate-vilanterol (BREO ELLIPTA ) 200-25 MCG/ACT AEPB; Inhale 1 puff into the lungs daily. - Ambulatory referral to Gynecology - Pneumococcal conjugate vaccine 20-valent (Prevnar 20) - Flu vaccine trivalent PF, 6mos and older(Flulaval,Afluria,Fluarix,Fluzone)    Return in about 3 months (around 05/25/2024).   Sharyle Fischer, DO

## 2024-02-25 LAB — MICROALBUMIN / CREATININE URINE RATIO
Creatinine, Urine: 208 mg/dL (ref 20–275)
Microalb Creat Ratio: 1 mg/g{creat} (ref <30)
Microalb, Ur: 0.3 mg/dL

## 2024-02-27 ENCOUNTER — Ambulatory Visit: Payer: Self-pay | Admitting: Internal Medicine

## 2024-02-28 ENCOUNTER — Ambulatory Visit

## 2024-02-29 ENCOUNTER — Encounter: Payer: Self-pay | Admitting: Gastroenterology

## 2024-02-29 ENCOUNTER — Ambulatory Visit
Admission: RE | Admit: 2024-02-29 | Discharge: 2024-02-29 | Disposition: A | Discharge status: Home / Self Care | Attending: Gastroenterology | Admitting: Gastroenterology

## 2024-02-29 ENCOUNTER — Other Ambulatory Visit: Payer: Self-pay | Admitting: Gastroenterology

## 2024-02-29 ENCOUNTER — Encounter
Admission: RE | Disposition: A | Discharge status: Home / Self Care | Payer: Self-pay | Source: Home / Self Care | Attending: Gastroenterology

## 2024-02-29 ENCOUNTER — Encounter: Payer: Self-pay | Admitting: Internal Medicine

## 2024-02-29 ENCOUNTER — Ambulatory Visit: Payer: Self-pay

## 2024-02-29 DIAGNOSIS — Z87891 Personal history of nicotine dependence: Secondary | ICD-10-CM | POA: Diagnosis not present

## 2024-02-29 DIAGNOSIS — R634 Abnormal weight loss: Secondary | ICD-10-CM | POA: Insufficient documentation

## 2024-02-29 DIAGNOSIS — K635 Polyp of colon: Secondary | ICD-10-CM | POA: Insufficient documentation

## 2024-02-29 DIAGNOSIS — Z7984 Long term (current) use of oral hypoglycemic drugs: Secondary | ICD-10-CM | POA: Insufficient documentation

## 2024-02-29 DIAGNOSIS — E119 Type 2 diabetes mellitus without complications: Secondary | ICD-10-CM | POA: Diagnosis not present

## 2024-02-29 DIAGNOSIS — J45909 Unspecified asthma, uncomplicated: Secondary | ICD-10-CM | POA: Insufficient documentation

## 2024-02-29 DIAGNOSIS — G4730 Sleep apnea, unspecified: Secondary | ICD-10-CM | POA: Diagnosis not present

## 2024-02-29 DIAGNOSIS — I10 Essential (primary) hypertension: Secondary | ICD-10-CM | POA: Diagnosis not present

## 2024-02-29 DIAGNOSIS — D120 Benign neoplasm of cecum: Secondary | ICD-10-CM | POA: Diagnosis not present

## 2024-02-29 DIAGNOSIS — D122 Benign neoplasm of ascending colon: Secondary | ICD-10-CM | POA: Diagnosis not present

## 2024-02-29 DIAGNOSIS — K219 Gastro-esophageal reflux disease without esophagitis: Secondary | ICD-10-CM | POA: Insufficient documentation

## 2024-02-29 DIAGNOSIS — R1310 Dysphagia, unspecified: Secondary | ICD-10-CM | POA: Insufficient documentation

## 2024-02-29 DIAGNOSIS — K5730 Diverticulosis of large intestine without perforation or abscess without bleeding: Secondary | ICD-10-CM | POA: Insufficient documentation

## 2024-02-29 DIAGNOSIS — K589 Irritable bowel syndrome without diarrhea: Secondary | ICD-10-CM | POA: Diagnosis not present

## 2024-02-29 DIAGNOSIS — I471 Supraventricular tachycardia, unspecified: Secondary | ICD-10-CM | POA: Diagnosis not present

## 2024-02-29 DIAGNOSIS — K222 Esophageal obstruction: Secondary | ICD-10-CM | POA: Diagnosis not present

## 2024-02-29 DIAGNOSIS — K449 Diaphragmatic hernia without obstruction or gangrene: Secondary | ICD-10-CM | POA: Diagnosis not present

## 2024-02-29 HISTORY — PX: ESOPHAGOGASTRODUODENOSCOPY: SHX5428

## 2024-02-29 HISTORY — PX: COLONOSCOPY: SHX5424

## 2024-02-29 HISTORY — PX: POLYPECTOMY: SHX149

## 2024-02-29 HISTORY — DX: Supraventricular tachycardia, unspecified: I47.10

## 2024-02-29 HISTORY — PX: ESOPHAGOSCOPY WITH DILITATION: SHX5618

## 2024-02-29 SURGERY — COLONOSCOPY
Anesthesia: General

## 2024-02-29 MED ORDER — PROPOFOL 500 MG/50ML IV EMUL
INTRAVENOUS | Status: DC | PRN
  Administered 2024-02-29: 140 ug/kg/min via INTRAVENOUS

## 2024-02-29 MED ORDER — PROPOFOL 10 MG/ML IV BOLUS
INTRAVENOUS | Status: DC | PRN
  Administered 2024-02-29 (×2): 30 mg via INTRAVENOUS
  Administered 2024-02-29: 70 mg via INTRAVENOUS
  Administered 2024-02-29: 40 mg via INTRAVENOUS

## 2024-02-29 MED ORDER — SODIUM CHLORIDE (PF) 0.9 % IJ SOLN
INTRAMUSCULAR | Status: DC | PRN
  Administered 2024-02-29: 4 mL via INTRAVENOUS

## 2024-02-29 MED ORDER — SODIUM CHLORIDE 0.9 % IV SOLN
INTRAVENOUS | Status: DC
  Administered 2024-02-29: 500 mL via INTRAVENOUS

## 2024-02-29 NOTE — H&P (Signed)
 Ruel Kung , MD [address], [place], [place], [address] Phone: [phone] Fax: [phone]  Primary Care Physician:  Bernardo Fend, DO   Pre-Procedure History & Physical: HPI:  Victoria [name] is a 63 y.o. 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   right forearm pathology report scanned refer to media   Melanocarcinoma [facility])    Osteoarthritis of both hips    Pre-diabetes    Psychotic disorder (HCC)    Pulmonary fibrosis (HCC)    Sleep apnea    SVT (supraventricular tachycardia)     Past Surgical History:  Procedure Laterality Date   ABDOMINAL HYSTERECTOMY     APPENDECTOMY  2006   APPLICATION OF INTRAOPERATIVE CT SCAN N/A 10/17/2023   Procedure: APPLICATION OF INTRAOPERATIVE CT SCAN;  Surgeon: Clois Fret, MD;  Location: [facility] ORS;  Service: Neurosurgery;  Laterality: N/A;   BREAST EXCISIONAL BIOPSY Right 1997   neg   ESOPHAGOGASTRODUODENOSCOPY (EGD) WITH PROPOFOL  N/A 09/20/2022   Procedure: ESOPHAGOGASTRODUODENOSCOPY (EGD) WITH PROPOFOL ;  Surgeon: Kung Ruel, MD;  Location: [facility] ENDOSCOPY;  Service: Gastroenterology;  Laterality: N/A;   excision of melanoma on Right arm     HIATAL HERNIA REPAIR     Jan 2017   LAPAROSCOPY  2020   TRANSFORAMINAL LUMBAR INTERBODY FUSION W/ MIS 1 LEVEL N/A 10/17/2023   Procedure: L4-5 MINIMALLY INVASIVE (MIS) TRANSFORAMINAL LUMBAR INTERBODY FUSION (TLIF);  Surgeon: Clois Fret, MD;  Location: [facility] ORS;  Service: Neurosurgery;  Laterality: N/A;  L4-5 MINIMALLY INVASIVE (MIS) TRANSFORAMINAL LUMBAR INTERBODY FUSION (TLIF)    Prior to Admission medications   Medication Sig Start  Date End Date Taking? Authorizing Provider  albuterol  (VENTOLIN  HFA) 108 (90 Base) MCG/ACT inhaler Inhale 1-2 puffs into the lungs every 6 (six) hours as needed for wheezing or shortness of breath. 12/14/23   Bernardo Fend, DO  ARIPiprazole (ABILIFY) 5 MG tablet Take 5 mg by mouth daily. 01/18/24   [provider]  B Complex Vitamins (B-COMPLEX/B-12 PO) Take by mouth.    [provider]  benzonatate  (TESSALON ) 200 MG capsule Take 1 capsule (200 mg total) by mouth every 6 (six) hours as needed for cough. 02/08/24 02/07/25  Sowles, Krichna, MD  buprenorphine (BUTRANS) 7.5 MCG/HR Place 1 patch onto the skin once a week. 08/29/23   [provider]  cholecalciferol  (VITAMIN D ) 25 MCG tablet Take 1 tablet (1,000 Units total) by mouth daily. 11/20/19   Clapacs, Norleen DASEN, MD  estradiol  (ESTRACE  VAGINAL) 0.1 MG/GM vaginal cream Apply 0.5mg  (pea-sized amount)  just inside the vaginal introitus with a finger-tip on Monday, Wednesday and Friday nights. 12/20/23   Helon Kirsch A, PA-C  FLUoxetine  (PROZAC ) 20 MG capsule Take 20 mg by mouth daily.    [provider]  FLUoxetine  (PROZAC ) 40 MG capsule Take by mouth every morning. 12/30/23   [provider]  fluticasone  furoate-vilanterol (BREO ELLIPTA ) 200-25 MCG/ACT AEPB Inhale 1 puff into the lungs daily. 02/24/24   Bernardo Fend, DO  gabapentin  (NEURONTIN ) 300 MG capsule Take 3 capsules (900 mg total) by mouth 3 (three)  times daily. Patient taking differently: Take 300 mg by mouth 3 (three) times daily. 10/22/23   Clois Fret, MD  hydrocortisone  2.5 % cream Apply topically behind ears on m-w-f at bedtime for seborrheic dermatitis 01/19/24   Hester Alm BROCKS, MD  ipratropium-albuterol  (DUONEB) 0.5-2.5 (3) MG/3ML SOLN Take 3 mLs by nebulization 3 (three) times daily as needed (cough wheeze SOB bronchitis/asthma symptoms). 12/08/22   Tapia, Leisa, PA-C  ketoconazole  (NIZORAL ) 2 % cream Apply 1 Application topically  daily. 12/30/22   Bernardo Fend, DO  ketoconazole  (NIZORAL ) 2 % shampoo apply 2 times per week, massage into behind ears and scalp and leave in for 5 -  10 minutes before rinsing out for seborrheic dermatitis 01/19/24   Hester Alm BROCKS, MD  losartan  (COZAAR ) 25 MG tablet Take 1 tablet (25 mg total) by mouth daily. 11/17/23   Sowles, Krichna, MD  metFORMIN  (GLUCOPHAGE ) 500 MG tablet Take 1 tablet (500 mg total) by mouth daily with breakfast. 02/24/24   Bernardo Fend, DO  miconazole (MONISTAT 1 COMBINATION PACK) kit Place 1 each vaginally once. Patient taking differently: Place 1 each vaginally as needed.    [provider]  MOVANTIK  25 MG TABS tablet Take 1 tablet (25 mg total) by mouth daily as needed (Constipation). 11/29/23   Bernardo Fend, DO  Multiple Vitamins-Minerals (MULTIVITAMIN WITH MINERALS) tablet Take 1 tablet by mouth daily.    [provider]  ondansetron  (ZOFRAN ) 8 MG tablet Take 1 tablet (8 mg total) by mouth every 8 (eight) hours as needed for nausea or vomiting. 06/18/22   Bernardo Fend, DO  ondansetron  (ZOFRAN -ODT) 4 MG disintegrating tablet Take 1 tablet (4 mg total) by mouth every 8 (eight) hours as needed for nausea or vomiting. 01/10/24   Bernardo Fend, DO  Oxycodone  HCl 10 MG TABS Take 1 tablet (10 mg total) by mouth every 6 (six) hours as needed (severe pain). 11/01/23   Hilma Hastings, PA-C  pantoprazole  (PROTONIX ) 40 MG tablet TAKE 1 TABLET BY MOUTH EVERY DAY IN THE MORNING 02/14/24   Bernardo Fend, DO  polyvinyl alcohol  (LIQUIFILM TEARS) 1.4 % ophthalmic solution Place 1 drop into both eyes 3 (three) times daily. Patient taking differently: Place 1 drop into both eyes as needed. 11/19/19   Clapacs, Norleen DASEN, MD  QUEtiapine  (SEROQUEL ) 100 MG tablet Take 1 tablet (100 mg total) by mouth 2 (two) times daily. 11/17/23   Sowles, Krichna, MD  SUMAtriptan  (IMITREX ) 100 MG tablet Take 1 tablet (100 mg total) by mouth once as needed for migraine. May  repeat in 2 hours if headache persists or recurs. 12/05/23 12/05/24  Marcelino Nurse, MD  venlafaxine  XR (EFFEXOR -XR) 75 MG 24 hr capsule Take 75 mg by mouth daily. 01/18/24   [provider]    Allergies as of 02/14/2024 - Review Complete 02/14/2024  Allergen Reaction Noted   Acetaminophen  Other (See Comments) 08/24/2023   Atorvastatin  09/29/2021   Sertraline Hives and Shortness Of Breath 12/08/2006   Cefuroxime Hives 12/08/2006   Codeine Hives 07/05/2015   Tape  10/04/2023   Amoxicillin Rash 07/05/2015   Penicillins Rash 07/05/2015   Sulfa antibiotics Rash 07/05/2015    Family History  Problem Relation Age of Onset   Alcohol  abuse Mother    Arthritis Mother    Heart disease Mother    Liver disease Mother    Osteoporosis Mother    Clotting disorder Mother    Diabetes Father    Arthritis Father    Stroke Father  Sarcoidosis Father    Heart failure Father    Bipolar disorder Sister    Schizophrenia Sister    Depression Sister    Anxiety disorder Sister    Alcohol  abuse Sister    Diabetes Sister    Arthritis Sister    Thyroid  disease Sister    Lupus Sister    Depression Sister    Anxiety disorder Sister    Alcohol  abuse Sister    Depression Sister    Anxiety disorder Sister    Ehlers-Danlos syndrome Daughter    Breast cancer Neg Hx     Social History   Socioeconomic History   Marital status: Married    Spouse name: bruce   Number of children: 1   Years of education: Not on file   Highest education level: Associate degree: academic program  Occupational History   Occupation: home maker  Tobacco Use   Smoking status: Former    Current packs/day: 0.00    Types: Cigarettes    Quit date: 1987    Years since quitting: 38.7   Smokeless tobacco: Never  Vaping Use   Vaping status: Never Used  Substance and Sexual Activity   Alcohol  use: No    Alcohol /week: 0.0 standard drinks of alcohol    Drug use: No    Comment: No use of illict drugs.   Sexual  activity: Not Currently  Other Topics Concern   Not on file  Social History Narrative   Lives with husband at home.   Are you right handed or left handed? Left   Are you currently employed ?    What is your current occupation? Retired/disabled   Do you live at home alone?   Who lives with you? husband   What type of home do you live in: 1 story or 2 story? two    Caffeine  3 cokes   Social Drivers of Corporate investment banker Strain: Medium Risk (02/13/2024)   Received from [facility]   Overall Financial Resource Strain (CARDIA)    Difficulty of Paying Living Expenses: Somewhat hard  Food Insecurity: Food Insecurity Present (02/13/2024)   Received from [facility]   Hunger Vital Sign    Within the past 12 months, you worried that your food would run out before you got the money to buy more.: Sometimes true    Within the past 12 months, the food you bought just didn't last and you didn't have money to get more.: Sometimes true  Transportation Needs: No Transportation Needs (02/13/2024)   Received from [facility]    In the past 12 months, has lack of transportation kept you from medical appointments or from getting medications?: No    Lack of Transportation (Non-Medical): No  Physical Activity: Insufficiently Active (08/25/2023)   Exercise Vital Sign    Days of Exercise per Week: 7 days    Minutes of Exercise per Session: 20 min  Stress: No Stress Concern Present (08/25/2023)   [employer] of Occupational Health - Occupational Stress Questionnaire    Feeling of Stress : Only a little  Social Connections: Moderately Isolated (08/25/2023)   Social Connection and Isolation Panel    Frequency of Communication with Friends and Family: Once a week    Frequency of Social Gatherings with Friends and Family: Never    Attends Religious Services: 1 to 4 times per year    Active Member of Clubs or  Organizations: No  Attends Banker Meetings: Never    Marital Status: Married  Catering manager Violence: Not At Risk (10/19/2023)   Humiliation, Afraid, Rape, and Kick questionnaire    Fear of Current or Ex-Partner: No    Emotionally Abused: No    Physically Abused: No    Sexually Abused: No    Review of Systems: See HPI, otherwise negative ROS  Physical Exam: Ht 5' 4 (1.626 m)   Wt 89.4 kg   BMI 33.81 kg/m  General:   Alert,  pleasant and cooperative in NAD Head:  Normocephalic and atraumatic. Neck:  Supple; no masses or thyromegaly. Lungs:  Clear throughout to auscultation, normal respiratory effort.    Heart:  +S1, +S2, Regular rate and rhythm, No edema. Abdomen:  Soft, nontender and nondistended. Normal bowel sounds, without guarding, and without rebound.   Neurologic:  Alert and  oriented x4;  grossly normal neurologically.  Impression/Plan: Victoria [name] is here for an endoscopy and colonoscopy  to be performed for  evaluation of dysphagia and weight loss    Risks, benefits, limitations, and alternatives regarding endoscopy have been reviewed with the patient.  Questions have been answered.  All parties agreeable.   Ruel Kung, MD  02/29/2024, 8:11 AM

## 2024-02-29 NOTE — Op Note (Signed)
 [facility] Gastroenterology Patient Name: Victoria [name] Procedure Date: 02/29/2024 8:21 AM MRN: [id number] Account #: [id number] Date of Birth: 1961-04-14 Admit Type: Outpatient Age: 63 Room: [facility] ENDO ROOM 3 Gender: Female Note Status: Finalized Instrument Name: Barnie GI Scope [id number] Procedure:             Upper GI endoscopy Indications:           Dysphagia, Weight loss Providers:             Ruel Kung MD, MD Referring MD:          Sharyle Fischer (Referring MD) Medicines:             Monitored Anesthesia Care Complications:         No immediate complications. Procedure:             Pre-Anesthesia Assessment:                        - Prior to the procedure, a History and Physical was                         performed, and patient medications, allergies and                         sensitivities were reviewed. The patient's tolerance                         of previous anesthesia was reviewed.                        - The risks and benefits of the procedure and the                         sedation options and risks were discussed with the                         patient. All questions were answered and informed                         consent was obtained.                        - ASA Grade Assessment: II - A patient with mild                         systemic disease.                        After obtaining informed consent, the endoscope was                         passed under direct vision. Throughout the procedure,                         the patient's blood pressure, pulse, and oxygen                         saturations were monitored continuously. The Endoscope                         was  introduced through the mouth, and advanced to the                         third part of duodenum. The upper GI endoscopy was                         accomplished with ease. The patient tolerated the                         procedure well. Findings:      The  examined duodenum was normal.      A small hiatal hernia was present.      One benign-appearing, intrinsic mild stenosis was found at the       gastroesophageal junction. This stenosis measured 1.6 cm (inner       diameter). The stenosis was traversed. A TTS dilator was passed through       the scope. Dilation with a 15-16.5-18 mm balloon dilator was performed       to 18 mm. The dilation site was examined and showed no change. Biopsies       were taken with a cold forceps for histology.      The cardia and gastric fundus were normal on retroflexion. Impression:            - Normal examined duodenum.                        - Small hiatal hernia.                        - Benign-appearing esophageal stenosis. Dilated.                         Biopsied. Recommendation:        - Await pathology results.                        - Perform a colonoscopy today. Procedure Code(s):     --- Professional ---                        [phone], Esophagogastroduodenoscopy, flexible,                         transoral; with transendoscopic balloon dilation of                         esophagus (less than 30 mm diameter)                        43239, 59, Esophagogastroduodenoscopy, flexible,                         transoral; with biopsy, single or multiple Diagnosis Code(s):     --- Professional ---                        K44.9, Diaphragmatic hernia without obstruction or                         gangrene  K22.2, Esophageal obstruction                        R13.10, Dysphagia, unspecified CPT copyright 2022 American Medical Association. All rights reserved. The codes documented in this report are preliminary and upon coder review may  be revised to meet current compliance requirements. Ruel Kung, MD Ruel Kung MD, MD 02/29/2024 8:49:47 AM This report has been signed electronically. Number of Addenda: 0 Note Initiated On: 02/29/2024 8:21 AM Estimated Blood Loss:  Estimated blood loss:  none.      [facility]

## 2024-02-29 NOTE — Anesthesia Preprocedure Evaluation (Addendum)
 Anesthesia Evaluation  Patient identified by MRN, date of birth, ID band Patient awake    Reviewed: Allergy & Precautions, NPO status , Patient's Chart, lab work & pertinent test results  Airway Mallampati: II  TM Distance: >3 FB Neck ROM: full    Dental  (+) Teeth Intact   Pulmonary asthma , sleep apnea , Patient abstained from smoking., former smoker Hx of pulmonary fibrosis   Pulmonary exam normal  + decreased breath sounds      Cardiovascular Exercise Tolerance: Good hypertension, Pt. on medications + dysrhythmias Supra Ventricular Tachycardia  Rhythm:Regular Rate:Normal     Neuro/Psych  Headaches, Seizures -, Well Controlled,     Bipolar Disorder   negative neurological ROS  negative psych ROS   GI/Hepatic negative GI ROS, Neg liver ROS,GERD  Medicated,,  Endo/Other  negative endocrine ROSdiabetes, Type 2, Oral Hypoglycemic Agents    Renal/GU negative Renal ROS  negative genitourinary   Musculoskeletal   Abdominal  (+) + obese  Peds negative pediatric ROS (+)  Hematology negative hematology ROS (+) Blood dyscrasia, anemia   Anesthesia Other Findings Past Medical History: No date: Asthma No date: Cervical spinal stenosis No date: Complication of anesthesia No date: Dyspnea No date: Dysrhythmia No date: Fatty liver No date: GERD (gastroesophageal reflux disease) No date: Hypercholesterolemia No date: Hypertension No date: Lumbar stenosis No date: Lyme disease 05/11/2018: Malignant melanoma in situ (HCC)     Comment:  right forearm pathology report scanned refer to media No date: Melanocarcinoma (HCC) No date: Osteoarthritis of both hips No date: Pre-diabetes No date: Psychotic disorder (HCC) No date: Pulmonary fibrosis (HCC) No date: Sleep apnea No date: SVT (supraventricular tachycardia)  Past Surgical History: No date: ABDOMINAL HYSTERECTOMY 2006: APPENDECTOMY 10/17/2023: APPLICATION OF  INTRAOPERATIVE CT SCAN; N/A     Comment:  Procedure: APPLICATION OF INTRAOPERATIVE CT SCAN;                Surgeon: Clois Fret, MD;  Location: [facility] ORS;                Service: Neurosurgery;  Laterality: N/A; No date: BACK SURGERY 1997: BREAST EXCISIONAL BIOPSY; Right     Comment:  neg No date: BREAST SURGERY 09/20/2022: ESOPHAGOGASTRODUODENOSCOPY (EGD) WITH PROPOFOL ; N/A     Comment:  Procedure: ESOPHAGOGASTRODUODENOSCOPY (EGD) WITH               PROPOFOL ;  Surgeon: Therisa Bi, MD;  Location: [facility]               ENDOSCOPY;  Service: Gastroenterology;  Laterality: N/A; No date: excision of melanoma on Right arm No date: HERNIA REPAIR No date: HIATAL HERNIA REPAIR     Comment:  Jan 2017 2020: LAPAROSCOPY 10/17/2023: TRANSFORAMINAL LUMBAR INTERBODY FUSION W/ MIS 1 LEVEL; N/A     Comment:  Procedure: L4-5 MINIMALLY INVASIVE (MIS) TRANSFORAMINAL               LUMBAR INTERBODY FUSION (TLIF);  Surgeon: Clois Fret, MD;  Location: [facility] ORS;  Service: Neurosurgery;              Laterality: N/A;  L4-5 MINIMALLY INVASIVE (MIS)               TRANSFORAMINAL LUMBAR INTERBODY FUSION (TLIF)  BMI    Body Mass Index: 34.33 kg/m      Reproductive/Obstetrics negative OB ROS  Anesthesia Physical Anesthesia Plan  ASA: 3  Anesthesia Plan: General   Post-op Pain Management:    Induction: Intravenous  PONV Risk Score and Plan: Propofol  infusion and TIVA  Airway Management Planned: Natural Airway and Nasal Cannula  Additional Equipment:   Intra-op Plan:   Post-operative Plan:   Informed Consent: I have reviewed the patients History and Physical, chart, labs and discussed the procedure including the risks, benefits and alternatives for the proposed anesthesia with the patient or authorized representative who has indicated his/her understanding and acceptance.     Dental Advisory Given  Plan Discussed  with: CRNA  Anesthesia Plan Comments:          Anesthesia Quick Evaluation

## 2024-02-29 NOTE — Op Note (Signed)
 [facility] Gastroenterology Patient Name: Victoria [name] Procedure Date: 02/29/2024 8:21 AM MRN: [id number] Account #: [id number] Date of Birth: [birth year] Admit Type: Outpatient Age: 63 Room: [facility] ENDO ROOM 3 Gender: Female Note Status: Finalized Instrument Name: Colon Scope [id number] Procedure:             Colonoscopy Indications:           Weight loss Providers:             Ruel Kung MD, MD Referring MD:          Sharyle Fischer (Referring MD) Medicines:             Monitored Anesthesia Care Complications:         No immediate complications. Procedure:             Pre-Anesthesia Assessment:                        - Prior to the procedure, a History and Physical was                         performed, and patient medications, allergies and                         sensitivities were reviewed. The patient's tolerance                         of previous anesthesia was reviewed.                        - The risks and benefits of the procedure and the                         sedation options and risks were discussed with the                         patient. All questions were answered and informed                         consent was obtained.                        - ASA Grade Assessment: II - A patient with mild                         systemic disease.                        After obtaining informed consent, the colonoscope was                         passed under direct vision. Throughout the procedure,                         the patient's blood pressure, pulse, and oxygen                         saturations were monitored continuously. The                         Colonoscope was introduced through the anus  and                         advanced to the the cecum, identified by the                         appendiceal orifice. The colonoscopy was performed                         with ease. The patient tolerated the procedure well.                         The  quality of the bowel preparation was excellent.                         The ileocecal valve, appendiceal orifice, and rectum                         were photographed. Findings:      The perianal and digital rectal examinations were normal.      Multiple medium-mouthed diverticula were found in the sigmoid colon.      Three sessile polyps were found in the descending colon, ascending colon       and cecum. The polyps were 6 to 8 mm in size. These polyps were removed       with a cold snare. Resection and retrieval were complete.      A 15 mm polyp was found in the ascending colon. The polyp was sessile.       Preparations were made for mucosal resection. Demarcation of the lesion       was performed with high-definition white light to clearly identify the       boundaries of the lesion. Saline was injected to raise the lesion. Snare       mucosal resection was performed. Resection and retrieval were complete.       Resected tissue margins were examined and clear of polyp tissue. To       prevent bleeding after mucosal resection, one hemostatic clip was       successfully placed. Clip manufacturer: [employer]. There was no       bleeding at the end of the procedure.      The exam was otherwise without abnormality on direct and retroflexion       views. Impression:            - Diverticulosis in the sigmoid colon.                        - Three 6 to 8 mm polyps in the descending colon, in                         the ascending colon and in the cecum, removed with a                         cold snare. Resected and retrieved.                        - One 15 mm polyp in the ascending colon, removed with  mucosal resection. Resected and retrieved. Clip was                         placed. Clip manufacturer: [employer].                        - The examination was otherwise normal on direct and                         retroflexion views.                         - Mucosal resection was performed. Resection and                         retrieval were complete. Recommendation:        - Discharge patient to home.                        - Resume previous diet.                        - Continue present medications.                        - Await pathology results.                        - Repeat colonoscopy in 3 years for surveillance. Procedure Code(s):     --- Professional ---                        [phone], Colonoscopy, flexible; with endoscopic mucosal                         resection                        45385, 59, Colonoscopy, flexible; with removal of                         tumor(s), polyp(s), or other lesion(s) by snare                         technique Diagnosis Code(s):     --- Professional ---                        D12.4, Benign neoplasm of descending colon                        D12.2, Benign neoplasm of ascending colon                        D12.0, Benign neoplasm of cecum                        R63.4, Abnormal weight loss                        K57.30, Diverticulosis of large intestine without  perforation or abscess without bleeding CPT copyright 2022 American Medical Association. All rights reserved. The codes documented in this report are preliminary and upon coder review may  be revised to meet current compliance requirements. Ruel Kung, MD Ruel Kung MD, MD 02/29/2024 9:31:17 AM This report has been signed electronically. Number of Addenda: 0 Note Initiated On: 02/29/2024 8:21 AM Scope Withdrawal Time: 0 hours 23 minutes 31 seconds  Total Procedure Duration: 0 hours 33 minutes 14 seconds  Estimated Blood Loss:  Estimated blood loss: none.      [facility]

## 2024-02-29 NOTE — Anesthesia Postprocedure Evaluation (Signed)
 Anesthesia Post Note  Patient: Victoria [name]  Procedure(s) Performed: COLONOSCOPY EGD (ESOPHAGOGASTRODUODENOSCOPY) ESOPHAGOSCOPY, WITH DILATION POLYPECTOMY, INTESTINE  Patient location during evaluation: PACU Anesthesia Type: General Level of consciousness: awake and awake and alert Pain management: satisfactory to patient Vital Signs Assessment: post-procedure vital signs reviewed and stable Respiratory status: spontaneous breathing Cardiovascular status: stable Anesthetic complications: no   No notable events documented.   Last Vitals:  Vitals:   02/29/24 0939 02/29/24 0949  BP: 114/87 129/77  Pulse: 61 64  Resp: (!) 25 (!) 21  Temp:    SpO2: 99% 99%    Last Pain:  Vitals:   02/29/24 0949  TempSrc:   PainSc: 0-No pain                 VAN STAVEREN,Shonya Sumida

## 2024-02-29 NOTE — Transfer of Care (Signed)
 Immediate Anesthesia Transfer of Care Note  Patient: Victoria [name]  Procedure(s) Performed: COLONOSCOPY EGD (ESOPHAGOGASTRODUODENOSCOPY) ESOPHAGOSCOPY, WITH DILATION POLYPECTOMY, INTESTINE  Patient Location: PACU  Anesthesia Type:General  Level of Consciousness: awake, alert , and oriented  Airway & Oxygen Therapy: Patient Spontanous Breathing and Patient connected to nasal cannula oxygen  Post-op Assessment: Report given to RN and Post -op Vital signs reviewed and stable  Post vital signs: Reviewed and stable  Last Vitals:  Vitals Value Taken Time  BP 141/70 02/29/24 09:29  Temp    Pulse 67 02/29/24 09:34  Resp 19 02/29/24 09:34  SpO2 95 % 02/29/24 09:34  Vitals shown include unfiled device data.  Last Pain:  Vitals:   02/29/24 0929  TempSrc:   PainSc: Asleep         Complications: No notable events documented.

## 2024-03-01 ENCOUNTER — Ambulatory Visit: Admitting: Internal Medicine

## 2024-03-01 ENCOUNTER — Other Ambulatory Visit: Payer: Self-pay | Admitting: Internal Medicine

## 2024-03-01 DIAGNOSIS — R110 Nausea: Secondary | ICD-10-CM

## 2024-03-01 LAB — SURGICAL PATHOLOGY

## 2024-03-01 MED ORDER — ONDANSETRON HCL 8 MG PO TABS: 8.0000 mg | ORAL_TABLET | Freq: Three times a day (TID) | ORAL | 0 refills | Status: DC | PRN

## 2024-03-03 ENCOUNTER — Other Ambulatory Visit: Payer: Self-pay | Admitting: Family Medicine

## 2024-03-03 DIAGNOSIS — F4312 Post-traumatic stress disorder, chronic: Secondary | ICD-10-CM | POA: Diagnosis not present

## 2024-03-03 DIAGNOSIS — F410 Panic disorder [episodic paroxysmal anxiety] without agoraphobia: Secondary | ICD-10-CM | POA: Diagnosis not present

## 2024-03-03 DIAGNOSIS — F411 Generalized anxiety disorder: Secondary | ICD-10-CM | POA: Diagnosis not present

## 2024-03-03 DIAGNOSIS — F5105 Insomnia due to other mental disorder: Secondary | ICD-10-CM | POA: Diagnosis not present

## 2024-03-03 DIAGNOSIS — F333 Major depressive disorder, recurrent, severe with psychotic symptoms: Secondary | ICD-10-CM | POA: Diagnosis not present

## 2024-03-03 DIAGNOSIS — J411 Mucopurulent chronic bronchitis: Secondary | ICD-10-CM

## 2024-03-05 ENCOUNTER — Other Ambulatory Visit: Payer: Self-pay | Admitting: Family Medicine

## 2024-03-06 ENCOUNTER — Other Ambulatory Visit: Payer: Self-pay

## 2024-03-06 ENCOUNTER — Ambulatory Visit: Attending: Urology

## 2024-03-06 DIAGNOSIS — M6281 Muscle weakness (generalized): Secondary | ICD-10-CM | POA: Insufficient documentation

## 2024-03-06 DIAGNOSIS — R293 Abnormal posture: Secondary | ICD-10-CM | POA: Diagnosis not present

## 2024-03-06 DIAGNOSIS — N3946 Mixed incontinence: Secondary | ICD-10-CM | POA: Diagnosis not present

## 2024-03-06 DIAGNOSIS — R2689 Other abnormalities of gait and mobility: Secondary | ICD-10-CM | POA: Diagnosis not present
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
## 2024-03-07 ENCOUNTER — Ambulatory Visit
Admission: RE | Admit: 2024-03-07 | Discharge: 2024-03-07 | Disposition: A | Discharge status: Home / Self Care | Source: Ambulatory Visit | Attending: Neurosurgery | Admitting: Neurosurgery

## 2024-03-07 DIAGNOSIS — M25511 Pain in right shoulder: Secondary | ICD-10-CM | POA: Diagnosis not present

## 2024-03-07 DIAGNOSIS — M542 Cervicalgia: Secondary | ICD-10-CM | POA: Insufficient documentation

## 2024-03-07 DIAGNOSIS — M4802 Spinal stenosis, cervical region: Secondary | ICD-10-CM | POA: Diagnosis not present

## 2024-03-07 DIAGNOSIS — M4312 Spondylolisthesis, cervical region: Secondary | ICD-10-CM | POA: Diagnosis not present

## 2024-03-07 DIAGNOSIS — M50221 Other cervical disc displacement at C4-C5 level: Secondary | ICD-10-CM | POA: Diagnosis not present

## 2024-03-09 DIAGNOSIS — H43813 Vitreous degeneration, bilateral: Secondary | ICD-10-CM | POA: Diagnosis not present

## 2024-03-09 DIAGNOSIS — H532 Diplopia: Secondary | ICD-10-CM | POA: Diagnosis not present

## 2024-03-09 DIAGNOSIS — H2513 Age-related nuclear cataract, bilateral: Secondary | ICD-10-CM | POA: Diagnosis not present

## 2024-03-09 DIAGNOSIS — H04123 Dry eye syndrome of bilateral lacrimal glands: Secondary | ICD-10-CM | POA: Diagnosis not present

## 2024-03-12 ENCOUNTER — Encounter: Payer: Self-pay | Admitting: Pulmonary Disease

## 2024-03-12 ENCOUNTER — Ambulatory Visit: Admitting: Pulmonary Disease

## 2024-03-12 VITALS — BP 110/78 | HR 69 | Temp 97.5°F | Ht 64.0 in | Wt 202.0 lb

## 2024-03-12 DIAGNOSIS — R0602 Shortness of breath: Secondary | ICD-10-CM | POA: Diagnosis not present

## 2024-03-12 MED ORDER — FLUTICASONE-SALMETEROL 500-50 MCG/ACT IN AEPB: 1.0000 | INHALATION_SPRAY | Freq: Two times a day (BID) | RESPIRATORY_TRACT | 6 refills | Status: AC

## 2024-03-12 MED ORDER — AIRSUPRA 90-80 MCG/ACT IN AERO: 2.0000 | INHALATION_SPRAY | Freq: Four times a day (QID) | RESPIRATORY_TRACT | 6 refills | Status: AC | PRN

## 2024-03-12 NOTE — Progress Notes (Signed)
 Synopsis: Referred in by Glenard Mire, MD   Subjective:   PATIENT ID: Victoria [name] GENDER: female DOB: 01-Feb-1961, MRN: [id number]  Chief Complaint  Patient presents with   Pulmonary Fibrosis    She was seeing a Pulmonologist is [place] 3 years ago for Fibrosis. Has not seen anyone since then. SOB. Wheezing. Cough with green/yellow sputum.  Using Albuterol  2-3 times a day. Duoneb 3 times a week. Breo 1 puff daily.    HPI Discussed the use of AI scribe software for clinical note transcription with the patient, who gave verbal consent to proceed.  History of Present Illness   Victoria [name] is a 63 year old female with asthma and sleep apnea who presents with worsening pulmonary symptoms.  She has been experiencing significant pulmonary problems, primarily difficulty with basic breathing, for the past two years. The condition is described as 'really bad', necessitating the use of her emergency inhaler approximately two to three times a day. She also uses a nebulizer and finds Boost oxygen helpful in managing her symptoms.  Her asthma was diagnosed eight years ago, and she also has sleep apnea. Various treatments for sleep apnea have been tried but are intolerable as she removes them during sleep. She is currently using Breo daily but still experiences symptoms. She has been prescribed Wixela but has not started it yet as she is finishing her current supply of Breo.  She experiences a cough that produces phlegm a few times a week, with the sputum being yellow, green, clear, or occasionally brown. Frequent wheezing and chest tightness are noted. She has a history of chronic bronchitis and pneumonia. A CT scan in the past reportedly showed pulmonary fibrosis.  CTA chest 09/2023 without signs of fibrosis but did show bibasilar consolidative opacities consistent with multifocal pneumonia.   Her family history includes lung diseases; her mother had COPD, and her sister had  emphysema. She quit smoking in her mid-twenties after smoking from age 44, with a history of smoking up to a pack a day. [place], and notes worsening symptoms since acquiring parakeets.  Allergy testing in the past revealed allergies to dust and beef. She is not currently on systemic steroids due to adverse effects but uses inhaled medications.      Family History  Problem Relation Age of Onset   Alcohol  abuse Mother    Arthritis Mother    Heart disease Mother    Liver disease Mother    Osteoporosis Mother    Clotting disorder Mother    Diabetes Father    Arthritis Father    Stroke Father    Sarcoidosis Father    Heart failure Father    Bipolar disorder Sister    Schizophrenia Sister    Depression Sister    Anxiety disorder Sister    Alcohol  abuse Sister    Diabetes Sister    Arthritis Sister    Thyroid  disease Sister    Lupus Sister    Depression Sister    Anxiety disorder Sister    Alcohol  abuse Sister    Depression Sister    Anxiety disorder Sister    Ehlers-Danlos syndrome Daughter    Breast cancer Neg Hx      Social History   Socioeconomic History   Marital status: Married    Spouse name: bruce   Number of children: 1   Years of education: Not on file   Highest education level: Associate degree: academic  program  Occupational History   Occupation: home maker  Tobacco Use   Smoking status: Former    Current packs/day: 0.00    Types: Cigarettes    Quit date: 1987    Years since quitting: 38.8   Smokeless tobacco: Never  Vaping Use   Vaping status: Never Used  Substance and Sexual Activity   Alcohol  use: No    Alcohol /week: 0.0 standard drinks of alcohol    Drug use: No    Comment: No use of illict drugs.   Sexual activity: Not Currently  Other Topics Concern   Not on file  Social History Narrative   Lives with husband at home.   Are you right handed or left handed? Left   Are you currently  employed ?    What is your current occupation? Retired/disabled   Do you live at home alone?   Who lives with you? husband   What type of home do you live in: 1 story or 2 story? two    Caffeine  3 cokes   Social Drivers of Corporate investment banker Strain: Medium Risk (02/13/2024)   Received from [facility]   Overall Financial Resource Strain (CARDIA)    Difficulty of Paying Living Expenses: Somewhat hard  Food Insecurity: Food Insecurity Present (02/13/2024)   Received from [facility]   Hunger Vital Sign    Within the past 12 months, you worried that your food would run out before you got the money to buy more.: Sometimes true    Within the past 12 months, the food you bought just didn't last and you didn't have money to get more.: Sometimes true  Transportation Needs: No Transportation Needs (02/13/2024)   Received from [facility] - Transportation    In the past 12 months, has lack of transportation kept you from medical appointments or from getting medications?: No    Lack of Transportation (Non-Medical): No  Physical Activity: Insufficiently Active (08/25/2023)   Exercise Vital Sign    Days of Exercise per Week: 7 days    Minutes of Exercise per Session: 20 min  Stress: No Stress Concern Present (08/25/2023)   [employer] of Occupational Health - Occupational Stress Questionnaire    Feeling of Stress : Only a little  Social Connections: Moderately Isolated (08/25/2023)   Social Connection and Isolation Panel    Frequency of Communication with Friends and Family: Once a week    Frequency of Social Gatherings with Friends and Family: Never    Attends Religious Services: 1 to 4 times per year    Active Member of [employer] or Organizations: No    Attends Banker Meetings: Never    Marital Status: Married  Catering manager Violence: Not At Risk (10/19/2023)   Humiliation, Afraid, Rape, and Kick  questionnaire    Fear of Current or Ex-Partner: No    Emotionally Abused: No    Physically Abused: No    Sexually Abused: No        Objective:   Vitals:   03/12/24 1010  BP: 110/78  Pulse: 69  Temp: (!) 97.5 F (36.4 C)  SpO2: 98%  Weight: 202 lb (91.6 kg)  Height: 5' 4 (1.626 m)   98% on RA BMI Readings from Last 3 Encounters:  03/12/24 34.67 kg/m  02/29/24 34.33 kg/m  02/24/24 33.81 kg/m   Wt Readings from Last 3 Encounters:  03/12/24 202 lb (91.6 kg)  02/29/24 200 lb (  90.7 kg)  02/24/24 197 lb (89.4 kg)    Physical Exam GEN: NAD, Healthy Appearing HEENT: Supple Neck, Reactive Pupils, EOMI  CVS: Normal S1, Normal S2, RRR, No murmurs or ES appreciated  Lungs: Clear bilateral air entry.  Abdomen: Soft, non tender, non distended, + BS  Extremities: Warm and well perfused, No edema   Labs and imaging reviewed.   Ancillary Information   CBC    Component Value Date/Time   WBC 7.2 01/20/2024 1354   RBC 4.36 01/20/2024 1354   HGB 13.1 01/20/2024 1354   HGB 15.2 02/10/2023 1433   HCT 40.2 01/20/2024 1354   HCT 46.6 02/10/2023 1433   PLT 229 01/20/2024 1354   PLT 214 02/10/2023 1433   MCV 92.2 01/20/2024 1354   MCV 96 02/10/2023 1433   MCH 30.0 01/20/2024 1354   MCHC 32.6 01/20/2024 1354   RDW 13.5 01/20/2024 1354   RDW 13.5 02/10/2023 1433   LYMPHSABS 1.8 10/22/2023 0951   LYMPHSABS 2.9 02/10/2023 1433   MONOABS 0.7 10/22/2023 0951   EOSABS 166 01/20/2024 1354   EOSABS 0.2 02/10/2023 1433   BASOSABS 43 01/20/2024 1354   BASOSABS 0.0 02/10/2023 1433        No data to display           Assessment & Plan:   #Asthma and chronic bronchitis Chronic respiratory issues with asthma and chronic bronchitis, history of pneumonia. Current CT reviewed does not show fibrosis or signs of ILD. - Order pulmonary function test to assess lung function and capacity. - Order High res chest CT to assess for ILD including IPF, HP (Due to daily bird  exposure) - Discontinue Breo and initiate Wixela with higher dose steroids, one puff twice daily. - Educated on rinsing mouth after using inhaler. - Prescribe Airsupra as a rescue inhaler, two puffs every six hours, to replace Albuterol . - Advise continued use of nebulizer as needed.  #Obstructive sleep apnea Obstructive sleep apnea with difficulty tolerating CPAP therapy.   Return in about 3 months (around 06/12/2024).  I personally spent a total of 60 minutes in the care of the patient today including preparing to see the patient, getting/reviewing separately obtained history, performing a medically appropriate exam/evaluation, counseling and educating, placing orders, documenting clinical information in the EHR, independently interpreting results, and communicating results.   Darrin Barn, MD [facility] 03/12/2024 10:35 AM

## 2024-03-13 ENCOUNTER — Other Ambulatory Visit: Payer: Self-pay

## 2024-03-13 ENCOUNTER — Ambulatory Visit

## 2024-03-13 DIAGNOSIS — R2689 Other abnormalities of gait and mobility: Secondary | ICD-10-CM | POA: Diagnosis not present

## 2024-03-13 DIAGNOSIS — N3946 Mixed incontinence: Secondary | ICD-10-CM | POA: Diagnosis not present

## 2024-03-13 DIAGNOSIS — M6281 Muscle weakness (generalized): Secondary | ICD-10-CM

## 2024-03-13 DIAGNOSIS — R293 Abnormal posture: Secondary | ICD-10-CM | POA: Diagnosis not present
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
## 2024-03-13 NOTE — Patient Instructions (Signed)
 We will stop PT for now, discharge will you work on respiratory issues and neck/back pain.  Continue your exercises as tolerated.  Get a new referral from your doctor as needed.

## 2024-03-20 ENCOUNTER — Ambulatory Visit

## 2024-03-20 NOTE — Progress Notes (Unsigned)
 03/21/2024 6:26 PM   Victoria [name] December 14, 1960 [id number]  Referring provider: Bernardo Fend, DO [address] [place],  [place] [address]  Urological history: 1. Urinary retention - post op (09/2023)   No chief complaint on file.  HPI: Victoria [name] is a 63 y.o. woman who presents today for three month follow up.   Previous records reviewed.   They are having (1 to 7) or (8 or more) daytime voids,  they are having nocturia (1-2) or (3 or more) and urgency is (none, mild, strong, severe).   They are having (stress, urge or mixed incontinence.)    they are having urinary leakage (1-2 times weekly, 3 or more times weekly, 1-2 times daily and 3 or more times daily) They are using absorbent products for leakage (no, sometimes, always )   the type of products they use are (panty liners, absorbant pads, depends) *** daily.  They are not limiting fluids.  They are not engaging in toilet mapping  ***  UA ***  PVR ***  Serum creatinine (12/2023) 0.85, eGFR 77  Hemoglobin A1c (11/2023) 6.2  Fluid consumption ***    PMH: 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   right forearm pathology report scanned refer to media   Melanocarcinoma [facility])    Osteoarthritis of both hips    Pre-diabetes    Psychotic disorder (HCC)    Pulmonary fibrosis (HCC)    Sleep apnea    SVT (supraventricular tachycardia)     Surgical History: Past Surgical History:  Procedure Laterality Date   ABDOMINAL HYSTERECTOMY     APPENDECTOMY  2006   APPLICATION OF INTRAOPERATIVE CT SCAN N/A 10/17/2023   Procedure: APPLICATION OF INTRAOPERATIVE CT SCAN;  Surgeon: Clois Fret, MD;  Location: [facility]  ORS;  Service: Neurosurgery;  Laterality: N/A;   BACK SURGERY     BREAST EXCISIONAL BIOPSY Right 1997   neg   BREAST SURGERY     COLONOSCOPY N/A 02/29/2024   Procedure: COLONOSCOPY;  Surgeon: Therisa Bi, MD;  Location: [facility] ENDOSCOPY;  Service: Gastroenterology;  Laterality: N/A;   ESOPHAGOGASTRODUODENOSCOPY N/A 02/29/2024   Procedure: EGD (ESOPHAGOGASTRODUODENOSCOPY);  Surgeon: Therisa Bi, MD;  Location[facility] ENDOSCOPY;  Service: Gastroenterology;  Laterality: N/A;   ESOPHAGOGASTRODUODENOSCOPY (EGD) WITH PROPOFOL  N/A 09/20/2022   Procedure: ESOPHAGOGASTRODUODENOSCOPY (EGD) WITH PROPOFOL ;  Surgeon: Therisa Bi, MD;  Location: [facility] ENDOSCOPY;  Service: Gastroenterology;  Laterality: N/A;   ESOPHAGOSCOPY WITH DILITATION  02/29/2024   Procedure: ESOPHAGOSCOPY, WITH DILATION;  Surgeon: Therisa Bi, MD;  Location: [facility] ENDOSCOPY;  Service: Gastroenterology;;   excision of melanoma on Right arm     HERNIA REPAIR     HIATAL HERNIA REPAIR     Jan 2017   LAPAROSCOPY  2020   POLYPECTOMY  02/29/2024   Procedure: POLYPECTOMY, INTESTINE;  Surgeon: Therisa Bi, MD;  Location: [facility] ENDOSCOPY;  Service: Gastroenterology;;   TRANSFORAMINAL LUMBAR INTERBODY FUSION W/ MIS 1 LEVEL N/A 10/17/2023   Procedure: L4-5 MINIMALLY INVASIVE (MIS) TRANSFORAMINAL LUMBAR INTERBODY FUSION (TLIF);  Surgeon: Clois Fret, MD;  Location: [facility] ORS;  Service: Neurosurgery;  Laterality: N/A;  L4-5 MINIMALLY INVASIVE (MIS) TRANSFORAMINAL  LUMBAR INTERBODY FUSION (TLIF)    Home Medications:  Allergies as of 03/21/2024       Reactions   Acetaminophen  Other (See Comments)   Liver issues   Atorvastatin    Myalgias    Sertraline Hives, Shortness Of Breath   Cefuroxime Hives   Has tolerated 1st generation cephalosporin (CEPHALEXIN) in the past with no documented ADRs.    Codeine Hives   Tape    Adhesive tape    Amoxicillin Rash   Penicillins Rash   Has tolerated 1st generation cephalosporin (CEPHALEXIN) in the past  with no documented ADRs.    Sulfa Antibiotics Rash        Medication List        Accurate as of March 20, 2024  6:26 PM. If you have any questions, ask your nurse or doctor.          Airsupra 90-80 MCG/ACT Aero Generic drug: Albuterol -Budesonide  Inhale 2 puffs into the lungs every 6 (six) hours as needed.   ARIPiprazole 5 MG tablet Commonly known as: ABILIFY Take 5 mg by mouth daily.   artificial tears ophthalmic solution Place 1 drop into both eyes 3 (three) times daily. What changed:  when to take this reasons to take this   B-COMPLEX/B-12 PO Take by mouth.   benzonatate  200 MG capsule Commonly known as: TESSALON  Take 1 capsule (200 mg total) by mouth every 6 (six) hours as needed for cough.   buprenorphine 7.5 MCG/HR Commonly known as: BUTRANS Place 1 patch onto the skin once a week.   estradiol  0.1 MG/GM vaginal cream Commonly known as: ESTRACE  VAGINAL Apply 0.5mg  (pea-sized amount)  just inside the vaginal introitus with a finger-tip on Monday, Wednesday and Friday nights.   FLUoxetine  20 MG capsule Commonly known as: PROZAC  Take 20 mg by mouth daily.   FLUoxetine  40 MG capsule Commonly known as: PROZAC  Take by mouth every morning.   fluticasone -salmeterol 500-50 MCG/ACT Aepb Commonly known as: Wixela Inhub Inhale 1 puff into the lungs in the morning and at bedtime.   gabapentin  800 MG tablet Commonly known as: NEURONTIN  Take 800 mg by mouth 3 (three) times daily.   hydrocortisone  2.5 % cream Apply topically behind ears on m-w-f at bedtime for seborrheic dermatitis   ipratropium-albuterol  0.5-2.5 (3) MG/3ML Soln Commonly known as: DUONEB Take 3 mLs by nebulization 3 (three) times daily as needed (cough wheeze SOB bronchitis/asthma symptoms).   ketoconazole  2 % cream Commonly known as: NIZORAL  Apply 1 Application topically daily.   ketoconazole  2 % shampoo Commonly known as: NIZORAL  apply 2 times per week, massage into behind ears and  scalp and leave in for 5 -  10 minutes before rinsing out for seborrheic dermatitis   losartan  25 MG tablet Commonly known as: COZAAR  TAKE 1 TABLET (25 MG TOTAL) BY MOUTH DAILY.   metFORMIN  500 MG tablet Commonly known as: GLUCOPHAGE  Take 1 tablet (500 mg total) by mouth daily with breakfast.   miconazole kit Commonly known as: MONISTAT 1 COMBINATION PACK Place 1 each vaginally once. What changed:  when to take this reasons to take this   Movantik  25 MG Tabs tablet Generic drug: naloxegol  oxalate Take 1 tablet (25 mg total) by mouth daily as needed (Constipation).   multivitamin with minerals tablet Take 1 tablet by mouth daily.   ondansetron  8 MG tablet Commonly known as: ZOFRAN  Take 1 tablet (8 mg total) by mouth every 8 (eight) hours as needed for nausea or vomiting.   Oxycodone  HCl  10 MG Tabs Take 1 tablet (10 mg total) by mouth every 6 (six) hours as needed (severe pain).   pantoprazole  40 MG tablet Commonly known as: PROTONIX  TAKE 1 TABLET BY MOUTH EVERY DAY IN THE MORNING   SUMAtriptan  100 MG tablet Commonly known as: IMITREX  Take 1 tablet (100 mg total) by mouth once as needed for migraine. May repeat in 2 hours if headache persists or recurs.   traZODone  100 MG tablet Commonly known as: DESYREL  Take 100-200 mg by mouth at bedtime.   venlafaxine  XR 75 MG 24 hr capsule Commonly known as: EFFEXOR -XR Take 75 mg by mouth daily.   vitamin D3 25 MCG tablet Commonly known as: CHOLECALCIFEROL  Take 1 tablet (1,000 Units total) by mouth daily.        Allergies:  Allergies  Allergen Reactions   Acetaminophen  Other (See Comments)    Liver issues   Atorvastatin     Myalgias    Sertraline Hives and Shortness Of Breath   Cefuroxime Hives    Has tolerated 1st generation cephalosporin (CEPHALEXIN) in the past with no documented ADRs.    Codeine Hives   Tape     Adhesive tape    Amoxicillin Rash   Penicillins Rash    Has tolerated 1st generation  cephalosporin (CEPHALEXIN) in the past with no documented ADRs.    Sulfa Antibiotics Rash    Family History: Family History  Problem Relation Age of Onset   Alcohol  abuse Mother    Arthritis Mother    Heart disease Mother    Liver disease Mother    Osteoporosis Mother    Clotting disorder Mother    Diabetes Father    Arthritis Father    Stroke Father    Sarcoidosis Father    Heart failure Father    Bipolar disorder Sister    Schizophrenia Sister    Depression Sister    Anxiety disorder Sister    Alcohol  abuse Sister    Diabetes Sister    Arthritis Sister    Thyroid  disease Sister    Lupus Sister    Depression Sister    Anxiety disorder Sister    Alcohol  abuse Sister    Depression Sister    Anxiety disorder Sister    Ehlers-Danlos syndrome Daughter    Breast cancer Neg Hx     Social History: See HPI for pertinent social history  ROS: Pertinent ROS in HPI  Physical Exam: There were no vitals taken for this visit.  Constitutional:  Well nourished. Alert and oriented, No acute distress. HEENT: [place] AT, moist mucus membranes.  Trachea midline, no masses. Cardiovascular: No clubbing, cyanosis, or edema. Respiratory: Normal respiratory effort, no increased work of breathing. GU: No CVA tenderness.  No bladder fullness or masses.  Recession of labia minora, dry, pale vulvar vaginal mucosa and loss of mucosal ridges and folds.  Normal urethral meatus, no lesions, no prolapse, no discharge.   No urethral masses, tenderness and/or tenderness. No bladder fullness, tenderness or masses. *** vagina mucosa, *** estrogen effect, no discharge, no lesions, *** pelvic support, *** cystocele and *** rectocele noted.  No cervical motion tenderness.  Uterus is freely mobile and non-fixed.  No adnexal/parametria masses or tenderness noted.  Anus and perineum are without rashes or lesions.   ***  Neurologic: Grossly intact, no focal deficits, moving all 4 extremities. Psychiatric: Normal mood  and affect.    Laboratory Data: See EPIC and HPI  I have reviewed the labs.   Pertinent Imaging:  12/20/23 14:42  Scan Result 11ml   Assessment & Plan:    1. Urinary retention - ***  2.  Mixed urinary incontinence - ***  3. Genitourinary Syndrome of Menopause (GSM)  - Explained that GSM is a common condition that affects women during and after menopause. It is characterized by a cluster of symptoms related to the genital and urinary tracts, including: vaginal dryness, pain or discomfort during intercourse (dyspareunia), burning or irritation in the vulva or vagina, thinning or loss of vaginal tissue, frequent urination, urgency (feeling the need to urinate immediately), incontinence (loss of bladder control), and pain or burning during urination - explained that GSM is primarily caused by the decline in estrogen levels during menopause. This leads to changes in the vaginal tissue, making it thinner, drier, and more susceptible to irritation. Other factors that may contribute to GSM include: Smoking, certain medications (e.g., chemotherapy drugs, and pelvic organ prolapse - advised that First-line treatment options are: Local low-dose vaginal estrogen (cream, ring, tablet), which improves dryness, irritation, dyspareunia and it is safe for most patients, including those with history of breast cancer (with multidisciplinary input). - advised they can also use vaginal moisturizers and lubricants (alone or combined) and avoid irritants and harsh cleansers.  Pelvic floor physical therapy for dysfunction is also helpful.  Referral to sex therapy or counseling if psychosocial concerns are present. - Reassured that there is no evidence linking local estrogen to breast or endometrial cancer - Explained that it may take up to 8 months of consistent application of the vaginal estrogen cream to cause unnecessary changes needed to address GSM and that this will be a lifelong medication - Will start  with vaginal estrogen cream, applying a pea-sized amount with a fingertip just inside the vaginal introitus every night for 30 days and then continuing on to Monday, Wednesday and Friday nights - estradiol  (ESTRACE ) 0.01 % CREA vaginal cream, Apply one pea-sized amount around the opening of the urethra daily for 30 days, then 3 times weekly moving forward, sent to pharmacy   4. OAB - ***  No follow-ups on file.  These notes generated with voice recognition software. I apologize for typographical errors.  CLOTILDA HELON RIGGERS  [facility] [address]  [address] [place], [place] [address] [phone]

## 2024-03-21 ENCOUNTER — Ambulatory Visit: Admitting: Urology

## 2024-03-21 ENCOUNTER — Encounter: Payer: Self-pay | Admitting: Urology

## 2024-03-21 VITALS — BP 149/84 | HR 78 | Ht 64.5 in | Wt 197.0 lb

## 2024-03-21 DIAGNOSIS — R339 Retention of urine, unspecified: Secondary | ICD-10-CM | POA: Diagnosis not present

## 2024-03-21 DIAGNOSIS — N3946 Mixed incontinence: Secondary | ICD-10-CM | POA: Diagnosis not present

## 2024-03-21 DIAGNOSIS — N952 Postmenopausal atrophic vaginitis: Secondary | ICD-10-CM | POA: Diagnosis not present

## 2024-03-21 DIAGNOSIS — N3281 Overactive bladder: Secondary | ICD-10-CM | POA: Diagnosis not present

## 2024-03-21 LAB — BLADDER SCAN AMB NON-IMAGING: Scan Result: 24

## 2024-03-21 MED ORDER — GEMTESA 75 MG PO TABS: 75.0000 mg | ORAL_TABLET | Freq: Every day | ORAL | Status: DC

## 2024-03-26 DIAGNOSIS — Z760 Encounter for issue of repeat prescription: Secondary | ICD-10-CM | POA: Diagnosis not present

## 2024-03-26 DIAGNOSIS — M5412 Radiculopathy, cervical region: Secondary | ICD-10-CM | POA: Diagnosis not present

## 2024-03-26 DIAGNOSIS — Z9181 History of falling: Secondary | ICD-10-CM | POA: Diagnosis not present

## 2024-03-26 DIAGNOSIS — Z79891 Long term (current) use of opiate analgesic: Secondary | ICD-10-CM | POA: Diagnosis not present

## 2024-03-26 DIAGNOSIS — G8929 Other chronic pain: Secondary | ICD-10-CM | POA: Diagnosis not present

## 2024-03-27 ENCOUNTER — Ambulatory Visit

## 2024-03-28 DIAGNOSIS — F4312 Post-traumatic stress disorder, chronic: Secondary | ICD-10-CM | POA: Diagnosis not present

## 2024-03-28 DIAGNOSIS — F5105 Insomnia due to other mental disorder: Secondary | ICD-10-CM | POA: Diagnosis not present

## 2024-03-28 DIAGNOSIS — F411 Generalized anxiety disorder: Secondary | ICD-10-CM | POA: Diagnosis not present

## 2024-03-28 DIAGNOSIS — F410 Panic disorder [episodic paroxysmal anxiety] without agoraphobia: Secondary | ICD-10-CM | POA: Diagnosis not present

## 2024-03-28 DIAGNOSIS — F333 Major depressive disorder, recurrent, severe with psychotic symptoms: Secondary | ICD-10-CM | POA: Diagnosis not present

## 2024-03-29 ENCOUNTER — Ambulatory Visit: Admitting: Neurosurgery

## 2024-03-29 ENCOUNTER — Encounter: Payer: Self-pay | Admitting: Neurosurgery

## 2024-03-29 VITALS — BP 128/84 | Ht 64.5 in | Wt 197.0 lb

## 2024-03-29 DIAGNOSIS — M5412 Radiculopathy, cervical region: Secondary | ICD-10-CM | POA: Diagnosis not present

## 2024-03-29 NOTE — Progress Notes (Unsigned)
   REFERRING PHYSICIAN:  Bernardo Sharyle Has [address] [place],  [place] [address]  DOS: 10/17/23 MIS TLIF L4-L5 with PSF  HISTORY OF PRESENT ILLNESS:  03/29/2024 Her back pain has been improving.  She is still having significant back pain, but has noticed improvement over the past several months.  I have sent her to physical therapy for her neck after our last visit, but her back pain was limiting her.  She is at the point where she thinks she could try again.  She continues to have neck pain and discomfort into her shoulder blades and down her arms.  01/03/2024 She is doing pretty well.  Her neuropathy has improved.  Her leg pain has improved.  Her back pain is improved.  She continues to have neck and arm discomfort.  Her pain is worse on the right than the left but extends down her arm into the first 4 digits of the right hand.  She is starting physical therapy on Thursday for her neck.    PHYSICAL EXAMINATION:  General: Patient is well developed, well nourished, calm, collected, and in no apparent distress.   NEUROLOGICAL:  General: In no acute distress.   Awake, alert, oriented to person, place, and time.  Pupils equal round and reactive to light.  Facial tone is symmetric.     Strength:          Side Iliopsoas Quads Hamstring PF DF EHL  R 5 5 5 5 5 5   L 5 5 5 5 5 5    Incisions well healed.   ROS (Neurologic):  Negative except as noted above  IMAGING: C spine MRI 03/07/2024 IMPRESSION: 1. C4-5: Moderate right neural foraminal stenosis, worsened. 2. C5-6: Mild central spinal canal stenosis with moderate bilateral neural foraminal stenosis, unchanged. Grade 1 anterolisthesis. 3. C6-7: Severe left and moderate right neural foraminal stenosis, unchanged.   Electronically signed by: Franky Stanford MD 03/11/2024 11:42 AM EDT RP Workstation: [id number]   ASSESSMENT/PLAN:  Victoria [name] is doing well s/p above surgery.  She has made continued  improvements.  She does still have some back pain, but hopefully she will see continued improvement over the next few months.  For her neck, we will restart physical therapy.  Hopefully this will help her.  If it does not, we will discuss surgical options including C4-7 anterior cervical discectomy and fusion to address her neuroforaminal stenosis and cervical radiculopathy.  I will see her back in clinic in 2 months.  Reeves Daisy MD Department of neurosurgery

## 2024-04-02 ENCOUNTER — Encounter: Admitting: Neurology

## 2024-04-02 ENCOUNTER — Encounter: Payer: Self-pay | Admitting: Neurology

## 2024-04-03 ENCOUNTER — Ambulatory Visit

## 2024-04-05 ENCOUNTER — Other Ambulatory Visit: Payer: Self-pay | Admitting: Family Medicine

## 2024-04-05 DIAGNOSIS — R053 Chronic cough: Secondary | ICD-10-CM

## 2024-04-10 ENCOUNTER — Ambulatory Visit

## 2024-04-10 DIAGNOSIS — M542 Cervicalgia: Secondary | ICD-10-CM | POA: Diagnosis not present

## 2024-04-16 DIAGNOSIS — F4312 Post-traumatic stress disorder, chronic: Secondary | ICD-10-CM | POA: Diagnosis not present

## 2024-04-16 DIAGNOSIS — F411 Generalized anxiety disorder: Secondary | ICD-10-CM | POA: Diagnosis not present

## 2024-04-16 DIAGNOSIS — M5416 Radiculopathy, lumbar region: Secondary | ICD-10-CM | POA: Diagnosis not present

## 2024-04-16 DIAGNOSIS — F410 Panic disorder [episodic paroxysmal anxiety] without agoraphobia: Secondary | ICD-10-CM | POA: Diagnosis not present

## 2024-04-16 DIAGNOSIS — F333 Major depressive disorder, recurrent, severe with psychotic symptoms: Secondary | ICD-10-CM | POA: Diagnosis not present

## 2024-04-16 DIAGNOSIS — M542 Cervicalgia: Secondary | ICD-10-CM | POA: Diagnosis not present

## 2024-04-16 DIAGNOSIS — M6281 Muscle weakness (generalized): Secondary | ICD-10-CM | POA: Diagnosis not present

## 2024-04-16 DIAGNOSIS — F5105 Insomnia due to other mental disorder: Secondary | ICD-10-CM | POA: Diagnosis not present

## 2024-04-17 ENCOUNTER — Ambulatory Visit

## 2024-04-18 ENCOUNTER — Ambulatory Visit: Admitting: Dermatology

## 2024-04-18 ENCOUNTER — Encounter: Payer: Self-pay | Admitting: Dermatology

## 2024-04-18 DIAGNOSIS — W908XXA Exposure to other nonionizing radiation, initial encounter: Secondary | ICD-10-CM | POA: Diagnosis not present

## 2024-04-18 DIAGNOSIS — L821 Other seborrheic keratosis: Secondary | ICD-10-CM

## 2024-04-18 DIAGNOSIS — L859 Epidermal thickening, unspecified: Secondary | ICD-10-CM | POA: Diagnosis not present

## 2024-04-18 DIAGNOSIS — L570 Actinic keratosis: Secondary | ICD-10-CM | POA: Diagnosis not present

## 2024-04-18 DIAGNOSIS — L820 Inflamed seborrheic keratosis: Secondary | ICD-10-CM | POA: Diagnosis not present

## 2024-04-18 DIAGNOSIS — L858 Other specified epidermal thickening: Secondary | ICD-10-CM

## 2024-04-18 DIAGNOSIS — D492 Neoplasm of unspecified behavior of bone, soft tissue, and skin: Secondary | ICD-10-CM | POA: Diagnosis not present

## 2024-04-18 DIAGNOSIS — L578 Other skin changes due to chronic exposure to nonionizing radiation: Secondary | ICD-10-CM | POA: Diagnosis not present

## 2024-04-18 NOTE — Patient Instructions (Signed)
 Cryotherapy Aftercare  Wash gently with soap and water everyday.   Apply Vaseline Jelly daily until healed.     Wound Care Instructions  Cleanse wound gently with soap and water once a day then pat dry with clean gauze. Apply a thin coat of Petrolatum (petroleum jelly, Vaseline) over the wound (unless you have an allergy to this). We recommend that you use a new, sterile tube of Vaseline. Do not pick or remove scabs. Do not remove the yellow or white healing tissue from the base of the wound.  Cover the wound with fresh, clean, nonstick gauze and secure with paper tape. You may use Band-Aids in place of gauze and tape if the wound is small enough, but would recommend trimming much of the tape off as there is often too much. Sometimes Band-Aids can irritate the skin.  You should call the office for your biopsy report after 1 week if you have not already been contacted.  If you experience any problems, such as abnormal amounts of bleeding, swelling, significant bruising, significant pain, or evidence of infection, please call the office immediately.  FOR ADULT SURGERY PATIENTS: If you need something for pain relief you may take 1 extra strength Tylenol (acetaminophen) AND 2 Ibuprofen (200mg  each) together every 4 hours as needed for pain. (do not take these if you are allergic to them or if you have a reason you should not take them.) Typically, you may only need pain medication for 1 to 3 days.       Recommend daily broad spectrum sunscreen SPF 30+ to sun-exposed areas, reapply every 2 hours as needed. Call for new or changing lesions.  Staying in the shade or wearing long sleeves, sun glasses (UVA+UVB protection) and wide brim hats (4-inch brim around the entire circumference of the hat) are also recommended for sun protection.      Due to recent changes in healthcare laws, you may see results of your pathology and/or laboratory studies on MyChart before the doctors have had a chance  to review them. We understand that in some cases there may be results that are confusing or concerning to you. Please understand that not all results are received at the same time and often the doctors may need to interpret multiple results in order to provide you with the best plan of care or course of treatment. Therefore, we ask that you please give us  2 business days to thoroughly review all your results before contacting the office for clarification. Should we see a critical lab result, you will be contacted sooner.   If You Need Anything After Your Visit  If you have any questions or concerns for your doctor, please call our main line at [phone] and press option 4 to reach your doctor's medical assistant. If no one answers, please leave a voicemail as directed and we will return your call as soon as possible. Messages left after 4 pm will be answered the following business day.   You may also send us  a message via MyChart. We typically respond to MyChart messages within 1-2 business days.  For prescription refills, please ask your pharmacy to contact our office. Our fax number is [phone].  If you have an urgent issue when the clinic is closed that cannot wait until the next business day, you can page your doctor at the number below.    Please note that while we do our best to be available for urgent issues outside of office hours, we are not  available 24/7.   If you have an urgent issue and are unable to reach us , you may choose to seek medical care at your doctor's office, retail clinic, urgent care center, or emergency room.  If you have a medical emergency, please immediately call 911 or go to the emergency department.  Pager Numbers  - Dr. Hester: [phone]  - Dr. Jackquline: [phone]  - Dr. Claudene: [phone]   - Dr. Raymund: [phone]  In the event of inclement weather, please call our main line at [phone] for an update on the status of any delays or  closures.  Dermatology Medication Tips: Please keep the boxes that topical medications come in in order to help keep track of the instructions about where and how to use these. Pharmacies typically print the medication instructions only on the boxes and not directly on the medication tubes.   If your medication is too expensive, please contact our office at [phone] option 4 or send us  a message through MyChart.   We are unable to tell what your co-pay for medications will be in advance as this is different depending on your insurance coverage. However, we may be able to find a substitute medication at lower cost or fill out paperwork to get insurance to cover a needed medication.   If a prior authorization is required to get your medication covered by your insurance company, please allow us  1-2 business days to complete this process.  Drug prices often vary depending on where the prescription is filled and some pharmacies may offer cheaper prices.  The website www.goodrx.com contains coupons for medications through different pharmacies. The prices here do not account for what the cost may be with help from insurance (it may be cheaper with your insurance), but the website can give you the price if you did not use any insurance.  - You can print the associated coupon and take it with your prescription to the pharmacy.  - You may also stop by our office during regular business hours and pick up a GoodRx coupon card.  - If you need your prescription sent electronically to a different pharmacy, notify our office through [facility] or by phone at [phone] option 4.     Si Usted Necesita Algo Despus de Su Visita  Tambin puede enviarnos un mensaje a travs de Clinical cytogeneticist. Por lo general respondemos a los mensajes de MyChart en el transcurso de 1 a 2 das hbiles.  Para renovar recetas, por favor pida a su farmacia que se ponga en contacto con nuestra oficina. Randi lakes de fax  es [place] [phone].  Si tiene un asunto urgente cuando la clnica est cerrada y que no puede esperar hasta el siguiente da hbil, puede llamar/localizar a su doctor(a) al nmero que aparece a continuacin.   Por favor, tenga en cuenta que aunque hacemos todo lo posible para estar disponibles para asuntos urgentes fuera del horario de [place], no estamos disponibles las 24 horas del da, los 7 [address]  [address] de la [place].   Si tiene un problema urgente y no puede comunicarse con nosotros, puede optar por buscar atencin mdica  en el consultorio de su doctor(a), en una clnica privada, en un centro de atencin urgente o en una sala de emergencias.  Si tiene Engineer, drilling, por favor llame inmediatamente al 911 o vaya a la sala de emergencias.  Nmeros de bper  - Dr. Hester: [phone]  - Dra. Jackquline: [phone]  - Dr. Claudene: [phone]  - Dra. Kitts:  [phone]  En caso de inclemencias del [place], por favor llame a nuestra lnea principal al [phone] para una actualizacin sobre el [place] de cualquier retraso o cierre.  Consejos para la medicacin en dermatologa: Por favor, guarde las cajas en las que vienen los medicamentos de uso tpico para ayudarle a seguir las instrucciones sobre dnde y cmo usarlos. Las farmacias generalmente imprimen las instrucciones del medicamento slo en las cajas y no directamente en los tubos del [place].   Si su medicamento es muy caro, por favor, pngase en contacto con landry rieger llamando al [phone] y presione la opcin 4 o envenos un mensaje a travs de Clinical cytogeneticist.   No podemos decirle cul ser su copago por los medicamentos por adelantado ya que esto es diferente dependiendo de la cobertura de su seguro. Sin embargo, es posible que podamos encontrar un medicamento sustituto a Audiological scientist un formulario para que el seguro cubra el medicamento que se considera necesario.   Si se requiere una autorizacin previa para  que su compaa de seguros malta su medicamento, por favor permtanos de 1 a 2 das hbiles para completar este proceso.  Los precios de los medicamentos varan con frecuencia dependiendo del Environmental consultant de dnde se surte la receta y alguna farmacias pueden ofrecer precios ms baratos.  El sitio web www.goodrx.com tiene cupones para medicamentos de Health and safety inspector. Los precios aqu no tienen en cuenta lo que podra costar con la ayuda del seguro (puede ser ms barato con su seguro), pero el sitio web puede darle el precio si no utiliz Tourist information centre manager.  - Puede imprimir el cupn correspondiente y llevarlo con su receta a la farmacia.  - Tambin puede pasar por nuestra oficina durante el horario de atencin regular y Education officer, museum una tarjeta de cupones de GoodRx.  - Si necesita que su receta se enve electrnicamente a una farmacia diferente, informe a nuestra oficina a travs de MyChart de [place] o por telfono llamando al [phone] y presione la opcin 4.

## 2024-04-18 NOTE — Progress Notes (Signed)
 Follow-Up Visit   Subjective  Victoria [name] is a 63 y.o. female who presents for the following: Actinic keratosis. Face.  3 month follow up. Tx with LN2 at last visit.   Recheck SK Tx on sternum. Patient reports it has not completely resolved.   The patient has spots, moles and lesions to be evaluated, some may be new or changing and the patient may have concern these could be cancer.  The following portions of the chart were reviewed this encounter and updated as appropriate: medications, allergies, medical history  Review of Systems:  No other skin or systemic complaints except as noted in HPI or Assessment and Plan.  Objective  Well appearing patient in no apparent distress; mood and affect are within normal limits.  A focused examination was performed of the following areas: Face   Relevant exam findings are noted in the Assessment and Plan.  L medial breast x1 Erythematous keratotic or waxy stuck-on papule or plaque. sternum 0.7 cm pink firm papule  face x10 (10) Erythematous thin papules/macules with gritty scale.   Assessment & Plan   INFLAMED SEBORRHEIC KERATOSIS L medial breast x1 Symptomatic, irritating, patient would like treated. Destruction of lesion - L medial breast x1 Complexity: simple   Destruction method: cryotherapy   Informed consent: discussed and consent obtained   Timeout:  patient name, date of birth, surgical site, and procedure verified Lesion destroyed using liquid nitrogen: Yes   Region frozen until ice ball extended beyond lesion: Yes   Outcome: patient tolerated procedure well with no complications   Post-procedure details: wound care instructions given   Additional details:  Prior to procedure, discussed risks of blister formation, small wound, skin dyspigmentation, or rare scar following cryotherapy. Recommend Vaseline ointment to treated areas while healing.   NEOPLASM OF SKIN sternum Epidermal / dermal shaving  Lesion  diameter (cm):  0.7 Informed consent: discussed and consent obtained   Timeout: patient name, date of birth, surgical site, and procedure verified   Procedure prep:  Patient was prepped and draped in usual sterile fashion Prep type:  Isopropyl alcohol  Anesthesia: the lesion was anesthetized in a standard fashion   Anesthetic:  1% lidocaine  w/ epinephrine  1-100,000 buffered w/ 8.4% NaHCO3 Instrument used: flexible razor blade   Hemostasis achieved with: pressure, aluminum chloride and electrodesiccation   Outcome: patient tolerated procedure well   Post-procedure details: sterile dressing applied and wound care instructions given   Dressing type: bandage and petrolatum    Destruction of lesion Complexity: extensive   Destruction method: electrodesiccation and curettage   Informed consent: discussed and consent obtained   Timeout:  patient name, date of birth, surgical site, and procedure verified Procedure prep:  Patient was prepped and draped in usual sterile fashion Prep type:  Isopropyl alcohol  Anesthesia: the lesion was anesthetized in a standard fashion   Anesthetic:  1% lidocaine  w/ epinephrine  1-100,000 buffered w/ 8.4% NaHCO3 Curettage performed in three different directions: Yes   Electrodesiccation performed over the curetted area: Yes   Curettage cycles:  3 Lesion length (cm):  0.7 Lesion width (cm):  0.7 Margin per side (cm):  0.2 Final wound size (cm):  1.1 Hemostasis achieved with:  pressure and aluminum chloride Outcome: patient tolerated procedure well with no complications   Post-procedure details: sterile dressing applied and wound care instructions given   Dressing type: bandage and petrolatum    Specimen 1 - Surgical pathology Differential Diagnosis: R/O BCC vs nevus vs ISK  Check Margins: No  EDC today AK (ACTINIC KERATOSIS) (10) face x10 (10) Actinic keratoses are precancerous spots that appear secondary to cumulative UV radiation exposure/sun exposure  over time. They are chronic with expected duration over 1 year. A portion of actinic keratoses will progress to squamous cell carcinoma of the skin. It is not possible to reliably predict which spots will progress to skin cancer and so treatment is recommended to prevent development of skin cancer.  Recommend daily broad spectrum sunscreen SPF 30+ to sun-exposed areas, reapply every 2 hours as needed.  Recommend staying in the shade or wearing long sleeves, sun glasses (UVA+UVB protection) and wide brim hats (4-inch brim around the entire circumference of the hat). Call for new or changing lesions. Destruction of lesion - face x10 (10) Complexity: simple   Destruction method: cryotherapy   Informed consent: discussed and consent obtained   Timeout:  patient name, date of birth, surgical site, and procedure verified Lesion destroyed using liquid nitrogen: Yes   Region frozen until ice ball extended beyond lesion: Yes   Outcome: patient tolerated procedure well with no complications   Post-procedure details: wound care instructions given   Additional details:  Prior to procedure, discussed risks of blister formation, small wound, skin dyspigmentation, or rare scar following cryotherapy. Recommend Vaseline ointment to treated areas while healing.   ACTINIC SKIN DAMAGE    SEBORRHEIC KERATOSIS - Stuck-on, waxy, tan-brown papules and/or plaques  - Benign-appearing - Discussed benign etiology and prognosis. - Observe - Call for any changes   ACTINIC DAMAGE - chronic, secondary to cumulative UV radiation exposure/sun exposure over time - diffuse scaly erythematous macules with underlying dyspigmentation - Recommend daily broad spectrum sunscreen SPF 30+ to sun-exposed areas, reapply every 2 hours as needed.  - Recommend staying in the shade or wearing long sleeves, sun glasses (UVA+UVB protection) and wide brim hats (4-inch brim around the entire circumference of the hat). - Call for new or  changing lesions.   Return for TBSE As Scheduled, With Dr. Hester.  I, Jill Parcell, CMA, am acting as scribe for Alm Hester, MD.   Documentation: I have reviewed the above documentation for accuracy and completeness, and I agree with the above.  Alm Hester, MD

## 2024-04-19 LAB — SURGICAL PATHOLOGY

## 2024-04-19 NOTE — Progress Notes (Unsigned)
 Bernardo Fend, DO   Chief Complaint  Patient presents with   Establish Care    HPI:      Victoria [name] is a 63 y.o. [id number] whose LMP was No LMP recorded. Patient has had a hysterectomy., presents today for increased sweating, vaginal irritation with increased discharge & itching. She has had three yeast infections at least in the last year, will be treated but then returns. Using estrace  twice weekly.  She reports sweats are her whole body, have been going on for years but have increased recently.  Hysterectomy with oopherectomy 2010 and has not been on HRT other than estrace  cream vaginally for >10y. She recently added Effexor  to her medication regimen. She is working to decrease oxycodone  use, currently taking three times a day.  She is concerned that low estrogen is contributing to the sweating. Accompanied today by her husband Victoria [name].    Patient Active Problem List   Diagnosis Date Noted   Diabetes mellitus type 2 with complications (HCC) 02/08/2024   Chronic cough 02/08/2024   Mucopurulent chronic bronchitis (HCC) 02/08/2024   Lumbar facet arthropathy 12/05/2023   Degeneration of intervertebral disc of lumbar region with discogenic back pain 12/05/2023   Cervical facet joint syndrome 12/05/2023   Obesity (BMI 30-39.9) 10/18/2023   Chronic bilateral low back pain with bilateral sciatica 10/17/2023   Spinal instability, lumbar 10/17/2023   Postoperative CSF leak 10/17/2023   Spondylolisthesis 10/17/2023   Nausea and vomiting 09/20/2022   Hyperlipidemia 09/29/2021   OSA (obstructive sleep apnea) 09/29/2021   Vitamin D  deficiency 09/29/2021   Long term (current) use of opiate analgesic 04/06/2021   Issue of repeat prescription for medication 04/06/2021   SVT (supraventricular tachycardia) 04/09/2020   Vaginal atrophy 04/09/2020   Class 1 obesity due to excess calories with serious comorbidity and body mass index (BMI) of 31.0 to 31.9 in adult 02/07/2020    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   Chronic fatigue 01/07/2020   History of melanoma 01/07/2020   Endometriosis 01/07/2020   Pulmonary fibrosis (HCC) 12/12/2019   Nonalcoholic steatohepatitis 12/12/2019   Convulsions (HCC) 12/12/2019   Irritable bowel syndrome 12/12/2019   Osteopenia 12/12/2019   Pernicious anemia 12/12/2019   Prediabetes 12/12/2019   Traumatic brain injury (HCC) 12/12/2019   Psychosis (HCC) 11/15/2019   Chronic pain syndrome 11/15/2019   Corneal abrasion 11/15/2019   Suicidal thoughts 11/15/2019   MDD (major depressive disorder), single episode, severe with psychosis (HCC) 11/14/2019   Migraine 07/30/2019   Stage 3a chronic kidney disease (HCC) 07/25/2019   Spinal stenosis, lumbar region, with neurogenic claudication 07/17/2019   Skin lesion 01/02/2019   Spondylolisthesis of lumbar region 04/24/2018   Numbness and tingling in both hands 12/30/2017   Cervical radicular pain 10/25/2017   Pain medication agreement 10/25/2017   Sacroiliitis 05/03/2016   Neuropathy 01/06/2016   Right hip pain 01/06/2016   MDD (major depressive disorder), recurrent, severe, with psychosis (HCC)    Osteoarthritis of both hips 07/06/2015   Dyslipidemia 07/06/2015   Hypertension 07/06/2015   GERD (gastroesophageal reflux disease) 07/06/2015   Asthma 07/06/2015   Bipolar I disorder, most recent episode manic, severe with psychotic features (HCC) 07/06/2015   Attention-deficit hyperactivity disorder, predominantly inattentive type 02/25/2014   Elevation of levels of liver transaminase levels 01/02/2014   Impaired fasting glucose 01/02/2014   Melanosis coli 09/20/2012   Screening for colon cancer 09/07/2012   Lactose intolerance 03/06/2012   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   Mild persistent asthma 03/26/2009  Acquired absence of other genital organ(s) 10/30/2008   Hx of total hysterectomy 10/30/2008   Lateral epicondylitis, unspecified elbow 08/03/2006   Tinnitus,  unspecified ear 08/11/2004    Past Surgical History:  Procedure Laterality Date   ABDOMINAL HYSTERECTOMY  2010   with BSO   APPENDECTOMY  2006   APPLICATION OF INTRAOPERATIVE CT SCAN N/A 10/17/2023   Procedure: APPLICATION OF INTRAOPERATIVE CT SCAN;  Surgeon: Clois Fret, MD;  Location: [facility] ORS;  Service: Neurosurgery;  Laterality: N/A;   BACK SURGERY     BREAST EXCISIONAL BIOPSY Right 1997   neg   BREAST SURGERY     COLONOSCOPY N/A 02/29/2024   Procedure: COLONOSCOPY;  Surgeon: Therisa Bi, MD;  Location: [facility] ENDOSCOPY;  Service: Gastroenterology;  Laterality: N/A;   ESOPHAGOGASTRODUODENOSCOPY N/A 02/29/2024   Procedure: EGD (ESOPHAGOGASTRODUODENOSCOPY);  Surgeon: Therisa Bi, MD;  Location: [facility] ENDOSCOPY;  Service: Gastroenterology;  Laterality: N/A;   ESOPHAGOGASTRODUODENOSCOPY (EGD) WITH PROPOFOL  N/A 09/20/2022   Procedure: ESOPHAGOGASTRODUODENOSCOPY (EGD) WITH PROPOFOL ;  Surgeon: Therisa Bi, MD;  Location: [facility] ENDOSCOPY;  Service: Gastroenterology;  Laterality: N/A;   ESOPHAGOSCOPY WITH DILITATION  02/29/2024   Procedure: ESOPHAGOSCOPY, WITH DILATION;  Surgeon: Therisa Bi, MD;  Location: [facility] ENDOSCOPY;  Service: Gastroenterology;;   excision of melanoma on Right arm     HERNIA REPAIR     HIATAL HERNIA REPAIR     Jan 2017   LAPAROSCOPY  2020   POLYPECTOMY  02/29/2024   Procedure: POLYPECTOMY, INTESTINE;  Surgeon: Therisa Bi, MD;  Location: [facility] ENDOSCOPY;  Service: Gastroenterology;;   TRANSFORAMINAL LUMBAR INTERBODY FUSION W/ MIS 1 LEVEL N/A 10/17/2023   Procedure: L4-5 MINIMALLY INVASIVE (MIS) TRANSFORAMINAL LUMBAR INTERBODY FUSION (TLIF);  Surgeon: Clois Fret, MD;  Location: [facility] ORS;  Service: Neurosurgery;  Laterality: N/A;  L4-5 MINIMALLY INVASIVE (MIS) TRANSFORAMINAL LUMBAR INTERBODY FUSION (TLIF)    Family History  Problem Relation Age of Onset   Alcohol  abuse Mother    Arthritis Mother    Heart disease Mother    Liver disease Mother     Osteoporosis Mother    Clotting disorder Mother    Diabetes Father    Arthritis Father    Stroke Father    Sarcoidosis Father    Heart failure Father    Bipolar disorder Sister    Schizophrenia Sister    Depression Sister    Anxiety disorder Sister    Alcohol  abuse Sister    Diabetes Sister    Arthritis Sister    Thyroid  disease Sister    Lupus Sister    Depression Sister    Anxiety disorder Sister    Alcohol  abuse Sister    Depression Sister    Anxiety disorder Sister    Ehlers-Danlos syndrome Daughter    Breast cancer Neg Hx     Social History   Socioeconomic History   Marital status: Married    Spouse name: Victoria [name]   Number of children: 1   Years of education: Not on file   Highest education level: Associate degree: academic program  Occupational History   Occupation: home maker  Tobacco Use   Smoking status: Former    Current packs/day: 0.00    Types: Cigarettes    Quit date: 1987    Years since quitting: 38.9   Smokeless tobacco: Never  Vaping Use   Vaping status: Never Used  Substance and Sexual Activity   Alcohol  use: No    Alcohol /week: 0.0 standard drinks of alcohol    Drug use: No  Comment: No use of illict drugs.   Sexual activity: Not Currently  Other Topics Concern   Not on file  Social History Narrative   Lives with husband at home.   Are you right handed or left handed? Left   Are you currently employed ?    What is your current occupation? Retired/disabled   Do you live at home alone?   Who lives with you? husband   What type of home do you live in: 1 story or 2 story? two    Caffeine  3 cokes   Social Drivers of Corporate Investment Banker Strain: Medium Risk (02/13/2024)   Received from [facility]   Overall Financial Resource Strain (CARDIA)    Difficulty of Paying Living Expenses: Somewhat hard  Food Insecurity: Food Insecurity Present (02/13/2024)   Received from [facility]   Hunger Vital Sign     Within the past 12 months, you worried that your food would run out before you got the money to buy more.: Sometimes true    Within the past 12 months, the food you bought just didn't last and you didn't have money to get more.: Sometimes true  Transportation Needs: No Transportation Needs (02/13/2024)   Received from [facility] - Transportation    In the past 12 months, has lack of transportation kept you from medical appointments or from getting medications?: No    Lack of Transportation (Non-Medical): No  Physical Activity: Insufficiently Active (08/25/2023)   Exercise Vital Sign    Days of Exercise per Week: 7 days    Minutes of Exercise per Session: 20 min  Stress: No Stress Concern Present (08/25/2023)   [employer] of Occupational Health - Occupational Stress Questionnaire    Feeling of Stress : Only a little  Social Connections: Moderately Isolated (08/25/2023)   Social Connection and Isolation Panel    Frequency of Communication with Friends and Family: Once a week    Frequency of Social Gatherings with Friends and Family: Never    Attends Religious Services: 1 to 4 times per year    Active Member of [employer] or Organizations: No    Attends Banker Meetings: Never    Marital Status: Married  Catering Manager Violence: Not At Risk (10/19/2023)   Humiliation, Afraid, Rape, and Kick questionnaire    Fear of Current or Ex-Partner: No    Emotionally Abused: No    Physically Abused: No    Sexually Abused: No    Outpatient Medications Prior to Visit  Medication Sig Dispense Refill   Albuterol -Budesonide  (AIRSUPRA ) 90-80 MCG/ACT AERO Inhale 2 puffs into the lungs every 6 (six) hours as needed. 9 g 6   ARIPiprazole (ABILIFY) 5 MG tablet Take 5 mg by mouth daily.     B Complex Vitamins (B-COMPLEX/B-12 PO) Take by mouth.     benzonatate  (TESSALON ) 200 MG capsule Take 1 capsule (200 mg total) by mouth every 6 (six) hours as needed for cough.  (Patient not taking: Reported on 04/20/2024) 90 capsule 0   buprenorphine (BUTRANS) 7.5 MCG/HR Place 1 patch onto the skin once a week.     cholecalciferol  (VITAMIN D ) 25 MCG tablet Take 1 tablet (1,000 Units total) by mouth daily. 30 tablet 1   FLUoxetine  (PROZAC ) 40 MG capsule Take by mouth every morning.     fluticasone -salmeterol (WIXELA INHUB) 500-50 MCG/ACT AEPB Inhale 1 puff into the lungs in the morning and at bedtime. 1  each 6   gabapentin  (NEURONTIN ) 800 MG tablet Take 800 mg by mouth 3 (three) times daily.     hydrocortisone  2.5 % cream Apply topically behind ears on m-w-f at bedtime for seborrheic dermatitis 30 g 11   ipratropium-albuterol  (DUONEB) 0.5-2.5 (3) MG/3ML SOLN Take 3 mLs by nebulization 3 (three) times daily as needed (cough wheeze SOB bronchitis/asthma symptoms). 180 mL 1   ketoconazole  (NIZORAL ) 2 % cream Apply 1 Application topically daily. 15 g 0   ketoconazole  (NIZORAL ) 2 % shampoo apply 2 times per week, massage into behind ears and scalp and leave in for 5 -  10 minutes before rinsing out for seborrheic dermatitis 120 mL 11   losartan  (COZAAR ) 25 MG tablet TAKE 1 TABLET (25 MG TOTAL) BY MOUTH DAILY. 90 tablet 0   metFORMIN  (GLUCOPHAGE ) 500 MG tablet Take 1 tablet (500 mg total) by mouth daily with breakfast. 90 tablet 1   miconazole (MONISTAT 1 COMBINATION PACK) kit Place 1 each vaginally once. (Patient taking differently: Place 1 each vaginally as needed.)     MOVANTIK  25 MG TABS tablet Take 1 tablet (25 mg total) by mouth daily as needed (Constipation). (Patient not taking: Reported on 04/20/2024) 90 tablet 1   Multiple Vitamins-Minerals (MULTIVITAMIN WITH MINERALS) tablet Take 1 tablet by mouth daily.     ondansetron  (ZOFRAN ) 8 MG tablet Take 1 tablet (8 mg total) by mouth every 8 (eight) hours as needed for nausea or vomiting. 20 tablet 0   Oxycodone  HCl 10 MG TABS Take 1 tablet (10 mg total) by mouth every 6 (six) hours as needed (severe pain). 20 tablet 0    pantoprazole  (PROTONIX ) 40 MG tablet TAKE 1 TABLET BY MOUTH EVERY DAY IN THE MORNING 90 tablet 0   polyvinyl alcohol  (LIQUIFILM TEARS) 1.4 % ophthalmic solution Place 1 drop into both eyes 3 (three) times daily. (Patient taking differently: Place 1 drop into both eyes as needed.) 15 mL 1   SUMAtriptan  (IMITREX ) 100 MG tablet Take 1 tablet (100 mg total) by mouth once as needed for migraine. May repeat in 2 hours if headache persists or recurs. 30 tablet 2   traZODone  (DESYREL ) 100 MG tablet Take 100-200 mg by mouth at bedtime.     venlafaxine  XR (EFFEXOR -XR) 75 MG 24 hr capsule Take 75 mg by mouth daily.     Vibegron  (GEMTESA ) 75 MG TABS Take 1 tablet (75 mg total) by mouth daily.     estradiol  (ESTRACE  VAGINAL) 0.1 MG/GM vaginal cream Apply 0.5mg  (pea-sized amount)  just inside the vaginal introitus with a finger-tip on Monday, Wednesday and Friday nights. 30 g 12   No facility-administered medications prior to visit.      ROS:  Review of Systems  Genitourinary:  Positive for vaginal discharge and vaginal pain.  Skin:        sweating     OBJECTIVE:   Vitals:  BP 130/82   Pulse 74   Wt 200 lb 3.2 oz (90.8 kg)   BMI 33.83 kg/m   Physical Exam Vitals reviewed.  Constitutional:      General: She is not in acute distress.    Appearance: She is ill-appearing. She is not toxic-appearing or diaphoretic.  HENT:     Head:     Comments: Band-aids to both cheeks d/t recent biopsy with dermatology Cardiovascular:     Rate and Rhythm: Normal rate.  Pulmonary:     Effort: Pulmonary effort is normal.  Genitourinary:    Comments: Loss  of definition to labia minora, right greater than left. Introitus narrow. Consistent with vulvovaginal atrophy of menopause. Tissue intact without lesion. Neurological:     Mental Status: She is alert and oriented to person, place, and time.     Motor: Tremor present.     Gait: Gait is intact.  Psychiatric:        Behavior: Behavior is cooperative.      Results: No results found for this or any previous visit (from the past 24 hours).   Assessment/Plan: 1. Encounter to establish care (Primary)  2. Vaginal discharge - Cervicovaginal ancillary only  3. Vagina itching - Cervicovaginal ancillary only  4. Hyperhidrosis  5. Vulvar atrophy  Restart nightly application of estrace  cream, aptima collected & will follow up on results.  Discussed that sweating may in part be due to low estrogen in menopause given >15y since surgical menopause I would not recommend starting HRT due to increased risks of cardiovascular complications and that it is not likely to be effective. Reviewed that multiple medications she is on can cause sweating as a side effect, including oxycodone , prozac  & new addition of Effexor . Comfort measures reviewed. She is not a candidate for Veozah due to NAFLD.   Meds ordered this encounter  Medications   estradiol  (ESTRACE ) 0.01 % CREA vaginal cream    Sig: Place 0.25 Applicatorfuls vaginally at bedtime for 30 days, THEN 0.25 Applicatorfuls every other day for 30 days, THEN 0.25 Applicatorfuls 3 (three) times a week.    Dispense:  90 g    Refill:  3     Harlene LITTIE Cisco, CNM 04/20/2024 11:17 AM

## 2024-04-20 ENCOUNTER — Other Ambulatory Visit (HOSPITAL_COMMUNITY)
Admission: RE | Admit: 2024-04-20 | Discharge: 2024-04-20 | Disposition: A | Discharge status: Home / Self Care | Source: Ambulatory Visit | Attending: Certified Nurse Midwife | Admitting: Certified Nurse Midwife

## 2024-04-20 ENCOUNTER — Ambulatory Visit: Payer: Self-pay | Admitting: Dermatology

## 2024-04-20 ENCOUNTER — Ambulatory Visit: Admitting: Certified Nurse Midwife

## 2024-04-20 ENCOUNTER — Encounter: Payer: Self-pay | Admitting: Certified Nurse Midwife

## 2024-04-20 VITALS — BP 130/82 | HR 74 | Wt 200.2 lb

## 2024-04-20 DIAGNOSIS — N898 Other specified noninflammatory disorders of vagina: Secondary | ICD-10-CM | POA: Diagnosis not present

## 2024-04-20 DIAGNOSIS — N905 Atrophy of vulva: Secondary | ICD-10-CM | POA: Diagnosis not present

## 2024-04-20 DIAGNOSIS — Z7689 Persons encountering health services in other specified circumstances: Secondary | ICD-10-CM | POA: Diagnosis not present

## 2024-04-20 DIAGNOSIS — R61 Generalized hyperhidrosis: Secondary | ICD-10-CM | POA: Diagnosis not present

## 2024-04-22 ENCOUNTER — Encounter: Payer: Self-pay | Admitting: Certified Nurse Midwife

## 2024-04-22 MED ORDER — ESTRADIOL 0.01 % VA CREA: TOPICAL_CREAM | VAGINAL | 3 refills | Status: AC

## 2024-04-23 DIAGNOSIS — M542 Cervicalgia: Secondary | ICD-10-CM | POA: Diagnosis not present

## 2024-04-23 LAB — CERVICOVAGINAL ANCILLARY ONLY
Bacterial Vaginitis (gardnerella): NEGATIVE
Candida Glabrata: NEGATIVE
Candida Vaginitis: NEGATIVE
Comment: NEGATIVE
Comment: NEGATIVE
Comment: NEGATIVE

## 2024-04-23 NOTE — Progress Notes (Unsigned)
 04/24/2024 3:55 PM   Victoria LITTIE Hum 17-May-1961 [id number]  Referring provider: Bernardo Fend, DO [address] [place],  [place] [address]  Urological history: 1. Urinary retention - post op (09/2023)   Chief Complaint  Patient presents with   Urinary Retention   HPI: Victoria [name] is a 63 y.o. woman who presents today for three month follow up with her husband, Victoria [name].   Previous records reviewed.   At her visit on 03/21/2024, she is having 8 or more daytime voids, 3 more episodes of nocturia with a severe urge to urinate.  She has both stress and urge incontinence.  She is leaking 1-2 times daily.  She wears 1-2 depends daily.  She sometimes limits fluid intake and she always engages in toilet mapping.  Patient denies any modifying or aggravating factors.  Patient denies any recent UTI's, gross hematuria, dysuria or suprapubic/flank pain.  Patient denies any fevers, chills, nausea or vomiting.  PVR 24 mL.  Serum creatinine (12/2023) 0.85, eGFR 77.  Hemoglobin A1c (11/2023) 6.2.  Fluid consumption: She drinks mostly soda and lemonade.  She has been applying the vaginal estrogen cream 3 nights weekly.  She states that the burning and discomfort have abated.  She also has a follow-up appointment with her gynecologist next week.  She was to continue estrogen vaginal cream and given Gemtesa  samples.   She is having 8 or more daytime voids, 3 or more episodes of nocturia with strong urge to urinate.  She is having incontinence with both stress and urge.  She leaks 1-2 times a week.  She wears a panty liner and depends daily.  She does limit fluid intake and she does engage in toilet mapping.  She states the Gemtesa  was helpful in improving her urinary symptoms, but she does not want to be on medication for the last of her life.  Patient denies any modifying or aggravating factors.  Patient denies any recent UTI's, gross hematuria, dysuria or suprapubic/flank  pain.  Patient denies any fevers, chills, nausea or vomiting.    She continues to have vaginal burning and has started applying her estrogen cream nightly.  PVR 38 mL    PMH: 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   right forearm pathology report scanned refer to media   Melanocarcinoma [facility])    Osteoarthritis of both hips    Pre-diabetes    Psychotic disorder (HCC)    Pulmonary fibrosis (HCC)    Sleep apnea    SVT (supraventricular tachycardia)     Surgical History: Past Surgical History:  Procedure Laterality Date   ABDOMINAL HYSTERECTOMY  2010   with BSO   APPENDECTOMY  2006   APPLICATION OF INTRAOPERATIVE CT SCAN N/A 10/17/2023   Procedure: APPLICATION OF INTRAOPERATIVE CT SCAN;  Surgeon: Clois Fret, MD;  Location: [facility] ORS;  Service: Neurosurgery;  Laterality: N/A;   BACK SURGERY     BREAST EXCISIONAL BIOPSY Right 1997   neg   BREAST SURGERY     COLONOSCOPY N/A 02/29/2024   Procedure: COLONOSCOPY;  Surgeon: Therisa Bi, MD;  Location: [facility] ENDOSCOPY;  Service: Gastroenterology;  Laterality:  N/A;   ESOPHAGOGASTRODUODENOSCOPY N/A 02/29/2024   Procedure: EGD (ESOPHAGOGASTRODUODENOSCOPY);  Surgeon: Therisa Bi, MD;  Location: [facility] ENDOSCOPY;  Service: Gastroenterology;  Laterality: N/A;   ESOPHAGOGASTRODUODENOSCOPY (EGD) WITH PROPOFOL  N/A 09/20/2022   Procedure: ESOPHAGOGASTRODUODENOSCOPY (EGD) WITH PROPOFOL ;  Surgeon: Therisa Bi, MD;  Location: [facility] ENDOSCOPY;  Service: Gastroenterology;  Laterality: N/A;   ESOPHAGOSCOPY WITH DILITATION  02/29/2024   Procedure: ESOPHAGOSCOPY, WITH DILATION;  Surgeon: Therisa Bi, MD;  Location: [facility] ENDOSCOPY;  Service: Gastroenterology;;   excision of  melanoma on Right arm     HERNIA REPAIR     HIATAL HERNIA REPAIR     Jan 2017   LAPAROSCOPY  2020   POLYPECTOMY  02/29/2024   Procedure: POLYPECTOMY, INTESTINE;  Surgeon: Therisa Bi, MD;  Location: [facility] ENDOSCOPY;  Service: Gastroenterology;;   TRANSFORAMINAL LUMBAR INTERBODY FUSION W/ MIS 1 LEVEL N/A 10/17/2023   Procedure: L4-5 MINIMALLY INVASIVE (MIS) TRANSFORAMINAL LUMBAR INTERBODY FUSION (TLIF);  Surgeon: Clois Fret, MD;  Location: [facility] ORS;  Service: Neurosurgery;  Laterality: N/A;  L4-5 MINIMALLY INVASIVE (MIS) TRANSFORAMINAL LUMBAR INTERBODY FUSION (TLIF)    Home Medications:  Allergies as of 04/24/2024       Reactions   Acetaminophen  Other (See Comments)   Liver issues   Atorvastatin    Myalgias    Sertraline Hives, Shortness Of Breath   Cefuroxime Hives   Has tolerated 1st generation cephalosporin (CEPHALEXIN) in the past with no documented ADRs.    Codeine Hives   Tape    Adhesive tape    Amoxicillin Rash   Penicillins Rash   Has tolerated 1st generation cephalosporin (CEPHALEXIN) in the past with no documented ADRs.    Sulfa Antibiotics Rash        Medication List        Accurate as of April 24, 2024  3:55 PM. If you have any questions, ask your nurse or doctor.          STOP taking these medications    benzonatate  200 MG capsule Commonly known as: TESSALON  Stopped by: Karstyn Birkey   Movantik  25 MG Tabs tablet Generic drug: naloxegol  oxalate Stopped by: CLOTILDA Vivyan Biggers       TAKE these medications    Airsupra  90-80 MCG/ACT Aero Generic drug: Albuterol -Budesonide  Inhale 2 puffs into the lungs every 6 (six) hours as needed.   ARIPiprazole 5 MG tablet Commonly known as: ABILIFY Take 5 mg by mouth daily.   artificial tears ophthalmic solution Place 1 drop into both eyes 3 (three) times daily. What changed:  when to take this reasons to take this   B-COMPLEX/B-12 PO Take by mouth.   baclofen  10 MG tablet Commonly known  as: LIORESAL  Take by mouth.   buprenorphine 7.5 MCG/HR Commonly known as: BUTRANS Place 1 patch onto the skin once a week.   estradiol  0.01 % Crea vaginal cream Commonly known as: ESTRACE  Place 0.25 Applicatorfuls vaginally at bedtime for 30 days, THEN 0.25 Applicatorfuls every other day for 30 days, THEN 0.25 Applicatorfuls 3 (three) times a week. Start taking on: April 22, 2024   FLUoxetine  40 MG capsule Commonly known as: PROZAC  Take by mouth every morning.   fluticasone -salmeterol 500-50 MCG/ACT Aepb Commonly known as: Wixela Inhub Inhale 1 puff into the lungs in the morning and at bedtime.   gabapentin  600 MG tablet Commonly known as: NEURONTIN  Take 600 mg by mouth 3 (three) times daily. What changed: Another medication with the same name was removed. Continue taking this medication, and follow the directions you  see here. Changed by: CLOTILDA CORNWALL   Gemtesa  75 MG Tabs Generic drug: Vibegron  Take 1 tablet (75 mg total) by mouth daily.   hydrocortisone  2.5 % cream Apply topically behind ears on m-w-f at bedtime for seborrheic dermatitis   hydrOXYzine  25 MG tablet Commonly known as: ATARAX  Take 25 mg by mouth daily as needed.   ipratropium-albuterol  0.5-2.5 (3) MG/3ML Soln Commonly known as: DUONEB Take 3 mLs by nebulization 3 (three) times daily as needed (cough wheeze SOB bronchitis/asthma symptoms).   ketoconazole  2 % cream Commonly known as: NIZORAL  Apply 1 Application topically daily.   ketoconazole  2 % shampoo Commonly known as: NIZORAL  apply 2 times per week, massage into behind ears and scalp and leave in for 5 -  10 minutes before rinsing out for seborrheic dermatitis   Linzess  145 MCG Caps capsule Generic drug: linaclotide  Take 145 mcg by mouth daily.   losartan  25 MG tablet Commonly known as: COZAAR  TAKE 1 TABLET (25 MG TOTAL) BY MOUTH DAILY.   metFORMIN  500 MG tablet Commonly known as: GLUCOPHAGE  Take 1 tablet (500 mg total) by mouth  daily with breakfast.   miconazole kit Commonly known as: MONISTAT 1 COMBINATION PACK Place 1 each vaginally once. What changed:  when to take this reasons to take this   multivitamin with minerals tablet Take 1 tablet by mouth daily.   Na Sulfate-K Sulfate-Mg Sulfate concentrate 17.5-3.13-1.6 GM/177ML Soln Commonly known as: SUPREP Take 354 mLs by mouth as directed.   ondansetron  8 MG tablet Commonly known as: ZOFRAN  Take 1 tablet (8 mg total) by mouth every 8 (eight) hours as needed for nausea or vomiting.   Oxycodone  HCl 10 MG Tabs Take 1 tablet (10 mg total) by mouth every 6 (six) hours as needed (severe pain).   pantoprazole  40 MG tablet Commonly known as: PROTONIX  TAKE 1 TABLET BY MOUTH EVERY DAY IN THE MORNING   SUMAtriptan  100 MG tablet Commonly known as: IMITREX  Take 1 tablet (100 mg total) by mouth once as needed for migraine. May repeat in 2 hours if headache persists or recurs.   traZODone  100 MG tablet Commonly known as: DESYREL  Take 100-200 mg by mouth at bedtime.   venlafaxine  XR 75 MG 24 hr capsule Commonly known as: EFFEXOR -XR Take 75 mg by mouth daily.   vitamin D3 25 MCG tablet Commonly known as: CHOLECALCIFEROL  Take 1 tablet (1,000 Units total) by mouth daily.        Allergies:  Allergies  Allergen Reactions   Acetaminophen  Other (See Comments)    Liver issues   Atorvastatin     Myalgias    Sertraline Hives and Shortness Of Breath   Cefuroxime Hives    Has tolerated 1st generation cephalosporin (CEPHALEXIN) in the past with no documented ADRs.    Codeine Hives   Tape     Adhesive tape    Amoxicillin Rash   Penicillins Rash    Has tolerated 1st generation cephalosporin (CEPHALEXIN) in the past with no documented ADRs.    Sulfa Antibiotics Rash    Family History: Family History  Problem Relation Age of Onset   Alcohol  abuse Mother    Arthritis Mother    Heart disease Mother    Liver disease Mother    Osteoporosis Mother     Clotting disorder Mother    Diabetes Father    Arthritis Father    Stroke Father    Sarcoidosis Father    Heart failure Father    Bipolar disorder Sister  Schizophrenia Sister    Depression Sister    Anxiety disorder Sister    Alcohol  abuse Sister    Diabetes Sister    Arthritis Sister    Thyroid  disease Sister    Lupus Sister    Depression Sister    Anxiety disorder Sister    Alcohol  abuse Sister    Depression Sister    Anxiety disorder Sister    Ehlers-Danlos syndrome Daughter    Breast cancer Neg Hx     Social History: See HPI for pertinent social history  ROS: Pertinent ROS in HPI  Physical Exam: BP (!) 141/83   Pulse 72   Ht 5' 4.5 (1.638 m)   Wt 198 lb (89.8 kg)   BMI 33.46 kg/m   Constitutional:  Well nourished. Alert and oriented, No acute distress. HEENT: [place] AT, moist mucus membranes.  Trachea midline Cardiovascular: No clubbing, cyanosis, or edema. Respiratory: Normal respiratory effort, no increased work of breathing. Neurologic: Grossly intact, no focal deficits, moving all 4 extremities. Psychiatric: Normal mood and affect.    Laboratory Data: See EPIC and HPI  I have reviewed the labs.   Pertinent Imaging:  04/24/24 15:29  Scan Result 38ml    Assessment & Plan:    1. Urinary retention - resolved   2.  Mixed urinary incontinence -She found Gemtesa  helpful but does not take a medication -explained the PTNS provides treatment by indirectly providing electrical stimulation to the nerves responsible for bladder and pelvic floor function - a needle electrode generates an adjustable electrical pulse that travels to the sacral plexus via the tibial nerve which is located in the ankle, among other functions, the sacral nerve plexus regulates bladder and pelvic floor function - treatment protocol requires once-a-week treatments for 12 weeks, 30 minutes per session and many patients begin to see improvements by the 8th treatment. Patients who respond  to treatment may require occasional treatments (~ once every 3 weeks) to sustain improvements. PTNS is a low-risk procedure. The most common side-effects with PTNS treatment are temporary and minor, resulting from the placement of the needle electrode. They include minor bleeding, mild pain and skin inflammation and 80% patients have seen up to an 50% success rate with this form of treatment - RTC for PTNS, if approved by insurance   - If insurance will not cover the PTNS, we will schedule an appointment with Dr. Gaston  3. Genitourinary Syndrome of Menopause (GSM)  - continue estradiol  (ESTRACE ) 0.01 % CREA vaginal cream  4. OAB - see #2 and #3  Return for pending PTNS PA .  These notes generated with voice recognition software. I apologize for typographical errors.  CLOTILDA HELON RIGGERS  [facility] [address]  [address] [place], [place] [address] [phone]

## 2024-04-23 NOTE — Telephone Encounter (Addendum)
 Called patient regarding bx results. She had seen results in [place] and denied further questions. Will recheck at next followup  ----- Message from Alm Rhyme sent at 04/20/2024 12:06 PM EST ----- FINAL DIAGNOSIS        1. Skin, sternum :       BENIGN EPIDERMAL HYPERPLASIA WITH HYPERKERATOSIS, SEE DESCRIPTION   Benign thickening of the skin No further treatment needed Recheck next visit ----- Message ----- From: Interface, Lab In Three Zero One Sent: 04/19/2024   4:11 PM EST To: Alm JAYSON Rhyme, MD

## 2024-04-24 ENCOUNTER — Encounter: Payer: Self-pay | Admitting: Urology

## 2024-04-24 ENCOUNTER — Ambulatory Visit: Admitting: Urology

## 2024-04-24 VITALS — BP 141/83 | HR 72 | Ht 64.5 in | Wt 198.0 lb

## 2024-04-24 DIAGNOSIS — N3946 Mixed incontinence: Secondary | ICD-10-CM

## 2024-04-24 DIAGNOSIS — R339 Retention of urine, unspecified: Secondary | ICD-10-CM | POA: Diagnosis not present

## 2024-04-24 DIAGNOSIS — N958 Other specified menopausal and perimenopausal disorders: Secondary | ICD-10-CM | POA: Diagnosis not present

## 2024-04-24 DIAGNOSIS — N3281 Overactive bladder: Secondary | ICD-10-CM | POA: Diagnosis not present

## 2024-04-24 LAB — BLADDER SCAN AMB NON-IMAGING

## 2024-04-25 ENCOUNTER — Ambulatory Visit: Payer: Self-pay | Admitting: Certified Nurse Midwife

## 2024-04-25 ENCOUNTER — Telehealth: Payer: Self-pay

## 2024-04-25 NOTE — Telephone Encounter (Signed)
 Called patient to scheduled for PTNS no answer

## 2024-04-30 ENCOUNTER — Ambulatory Visit: Admitting: Neurology

## 2024-04-30 ENCOUNTER — Telehealth: Payer: Self-pay | Admitting: Neurology

## 2024-04-30 DIAGNOSIS — R202 Paresthesia of skin: Secondary | ICD-10-CM

## 2024-04-30 DIAGNOSIS — R52 Pain, unspecified: Secondary | ICD-10-CM

## 2024-04-30 DIAGNOSIS — M5417 Radiculopathy, lumbosacral region: Secondary | ICD-10-CM

## 2024-04-30 DIAGNOSIS — G629 Polyneuropathy, unspecified: Secondary | ICD-10-CM

## 2024-04-30 NOTE — Procedures (Signed)
 [facility]  [address], [address]  [place], [place] [address] Tel: [phone] Fax: [phone] Test Date:  04/30/2024  Patient: Victoria [name] DOB: Oct 31, 1960 Physician: Venetia Potters, MD  Sex: Female Height: 5' 4.5 Ref Phys: Venetia Potters, MD  ID#: [id number]   Technician:    History: This is a 63 year old female with diffuse pain and subjective weakness.  NCV & EMG Findings: Extensive electrodiagnostic evaluation of the left upper and lower limbs show: Left sural and superficial peroneal/fibular sensory responses are absent. Left median, ulnar, radial, and median-ulnar palmar sensory responses are within normal limits. Left peroneal/fibular (EDB), tibial (AH), median (APB), and ulnar (ADM) motor responses are within normal limits. Left H reflex is absent. Chronic motor axon loss changes without accompanying active denervation changes are seen in the left medial head of gastrocnemius and short head of biceps femoris muscles. Lumbosacral paraspinal muscles were deferred due to prior lumbosacral spine surgery.  Impression: This is an abnormal study. 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 The residuals of an old intraspinal canal lesion (ie: motor radiculopathy) at the left S1 root or segment, mild in degree electrically. No electrodiagnostic evidence of a left cervical (C5-C8) motor radiculopathy. No electrodiagnostic evidence of a left median mononeuropathy at or distal to the wrist (ie: carpal tunnel syndrome).    ___________________________ Venetia Potters, MD    Nerve Conduction Studies Motor Nerve Results    Latency Amplitude F-Lat Segment Distance CV Comment  Site (ms) Norm (mV) Norm (ms)  (cm) (m/s) Norm   Left Fibular (EDB) Motor  Ankle 3.4  < 6.0 2.9  > 2.5        Bel fib head 10.3 - 2.6 -  Bel fib head-Ankle 29 42  > 40   Pop fossa 12.2 - 2.4  -  Pop fossa-Bel fib head 9 47 -   Left Median (APB) Motor  Wrist 2.8  < 4.0 7.7  > 5.0        Elbow 7.2 - 7.6 -  Elbow-Wrist 26 59  > 50   Left Tibial (AH) Motor  Ankle 4.2  < 6.0 5.0  > 4.0        Knee 11.5 - 3.1 -  Knee-Ankle 36 49  > 40   Left Ulnar (ADM) Motor  Wrist 1.70  < 3.1 8.2  > 7.0        Bel elbow 5.2 - 7.5 -  Bel elbow-Wrist 19.5 56  > 50   Ab elbow 7.0 - 6.9 -  Ab elbow-Bel elbow 10 56 -    Sensory Sites    Neg Peak Lat Amplitude (O-P) Segment Distance Velocity Comment  Site (ms) Norm (V) Norm  (cm) (ms)   Left Median Sensory  Wrist-Dig II 3.3  < 3.8 32  > 10 Wrist-Dig II 13    Left Median-Ulnar Palmar Sensory       Median  Palm-Wrist 2.2  < 2.2 66  > 10 Palm-Wrist 8         Ulnar  Palm-Wrist 1.93  < 2.2 17  > 5 Palm-Wrist 8    Left Radial Sensory  Forearm-Wrist 2.1  < 2.8 14  > 10 Forearm-Wrist 10    Left Superficial Fibular Sensory  14 cm-Ankle *NR  < 4.6 *NR  > 3 14 cm-Ankle 14    Left Sural Sensory  Calf-Lat mall *NR  <  4.6 *NR  > 3 Calf-Lat mall 14    Left Ulnar Sensory  Wrist-Dig V 2.6  < 3.2 20  > 5 Wrist-Dig V 11     H-Reflex Results    M-Lat H Lat H Neg Amp H-M Lat  Site (ms) (ms) Norm (mV) (ms)  Left Tibial H-Reflex  Pop fossa 8.6 -  < 35.0 - -   Inter-Nerve Comparisons   Nerve 1 Value 1 Nerve 2 Value 2 Parameter Result Normal  Sensory Sites  L Median Palm-Wrist 2.2 ms L Ulnar Palm-Wrist 1.93 ms Peak Lat Diff 0.27 ms <0.40   Electromyography   Side Muscle Ins.Act Fibs Fasc Recrt Amp Dur Poly Activation Comment  Left Tib ant Nml Nml Nml Nml Nml Nml Nml Nml N/A  Left Gastroc MH Nml Nml Nml *1- *1+ *1+ *1+ Nml N/A  Left Rectus fem Nml Nml Nml Nml Nml Nml Nml Nml N/A  Left Biceps fem SH Nml Nml Nml *1- *1+ *1+ *1+ Nml N/A  Left Gluteus med Nml Nml Nml Nml Nml Nml Nml Nml N/A  Left FDI Nml Nml Nml Nml Nml Nml Nml Nml N/A  Left Pronator teres Nml Nml Nml Nml Nml Nml Nml Nml N/A  Left Biceps Nml Nml Nml Nml Nml Nml Nml Nml N/A  Left Triceps  lat hd Nml Nml Nml Nml Nml Nml Nml Nml N/A  Left Deltoid Nml Nml Nml Nml Nml Nml Nml Nml N/A      Waveforms:  Motor           Sensory               H-Reflex

## 2024-04-30 NOTE — Telephone Encounter (Signed)
 Discussed the results of EMG after procedure today. 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 Her extensive labs have shown only mild DM as possible risk factors for neuropathy.  There is also residuals of left S1 radiculopathy, likely accounting for asymmetric reflexes seen on exam. There was no evidence of left CTS which was reported on prior EMG, suggesting possible improvement since prior EMG.  While neuropathy and S1 radiculopathy could explain findings distally, I do not have a clear neurologic reason for thigh pain or more diffuse pain including arms. This more diffuse pain may be better explained by her previous diagnosis of fibromyalgia  Patient will continue to follow with pain management. All questions were answered.  Venetia Potters, MD [facility]

## 2024-04-30 NOTE — Telephone Encounter (Signed)
 Called patient to scheduled for PTNS no answer

## 2024-05-01 ENCOUNTER — Encounter: Payer: Self-pay | Admitting: Neurology

## 2024-05-03 ENCOUNTER — Ambulatory Visit
Admission: RE | Admit: 2024-05-03 | Discharge: 2024-05-03 | Disposition: A | Source: Ambulatory Visit | Attending: Pulmonary Disease

## 2024-05-03 DIAGNOSIS — R918 Other nonspecific abnormal finding of lung field: Secondary | ICD-10-CM | POA: Diagnosis not present

## 2024-05-03 DIAGNOSIS — R0602 Shortness of breath: Secondary | ICD-10-CM | POA: Insufficient documentation

## 2024-05-07 ENCOUNTER — Ambulatory Visit: Admitting: Urology

## 2024-05-10 ENCOUNTER — Other Ambulatory Visit: Payer: Self-pay | Admitting: Internal Medicine

## 2024-05-11 NOTE — Telephone Encounter (Signed)
 Requested Prescriptions  Pending Prescriptions Disp Refills   pantoprazole  (PROTONIX ) 40 MG tablet [Pharmacy Med Name: PANTOPRAZOLE  SOD DR 40 MG TAB] 90 tablet 2    Sig: TAKE 1 TABLET BY MOUTH EVERY DAY IN THE MORNING     Gastroenterology: Proton Pump Inhibitors Passed - 05/11/2024  2:03 PM      Passed - Valid encounter within last 12 months    Recent Outpatient Visits           2 months ago Diabetes mellitus type 2 with complications [facility])   [facility] Bernardo Fend, DO   3 months ago Pulmonary fibrosis [facility])   [facility] Glenard Mire, MD   3 months ago Pulmonary fibrosis [facility])   [facility] Myrla Jon HERO, MD   3 months ago Chronic bronchitis, unspecified chronic bronchitis type [facility])   [facility] Gareth Mliss FALCON, FNP   5 months ago Opacity of lung on imaging study   [facility] Bernardo Fend, DO       Future Appointments             In 3 weeks McGowan, Clotilda DELENA RIGGERS [facility] [place]   In 8 months Hester Alm BROCKS, MD [facility]

## 2024-05-15 ENCOUNTER — Ambulatory Visit

## 2024-05-15 ENCOUNTER — Ambulatory Visit: Admitting: Pulmonary Disease

## 2024-05-15 ENCOUNTER — Encounter: Payer: Self-pay | Admitting: Pulmonary Disease

## 2024-05-15 VITALS — BP 130/84 | HR 72 | Temp 98.4°F | Ht 64.0 in | Wt 201.2 lb

## 2024-05-15 DIAGNOSIS — R0602 Shortness of breath: Secondary | ICD-10-CM

## 2024-05-15 DIAGNOSIS — R911 Solitary pulmonary nodule: Secondary | ICD-10-CM

## 2024-05-15 LAB — PULMONARY FUNCTION TEST
DL/VA % pred: 125 %
DL/VA: 5.27 ml/min/mmHg/L
DLCO unc % pred: 100 %
DLCO unc: 19.99 ml/min/mmHg
FEF 25-75 Post: 3.25 L/s
FEF 25-75 Pre: 2.67 L/s
FEF2575-%Change-Post: 21 %
FEF2575-%Pred-Post: 146 %
FEF2575-%Pred-Pre: 120 %
FEV1-%Change-Post: 6 %
FEV1-%Pred-Post: 97 %
FEV1-%Pred-Pre: 91 %
FEV1-Post: 2.42 L
FEV1-Pre: 2.27 L
FEV1FVC-%Change-Post: 6 %
FEV1FVC-%Pred-Pre: 106 %
FEV6-%Change-Post: 0 %
FEV6-%Pred-Post: 89 %
FEV6-%Pred-Pre: 88 %
FEV6-Post: 2.77 L
FEV6-Pre: 2.75 L
FEV6FVC-%Pred-Post: 103 %
FEV6FVC-%Pred-Pre: 103 %
FVC-%Change-Post: 0 %
FVC-%Pred-Post: 86 %
FVC-%Pred-Pre: 85 %
FVC-Post: 2.77 L
FVC-Pre: 2.75 L
Post FEV1/FVC ratio: 87 %
Post FEV6/FVC ratio: 100 %
Pre FEV1/FVC ratio: 82 %
Pre FEV6/FVC Ratio: 100 %
RV % pred: 91 %
RV: 1.87 L
TLC % pred: 86 %
TLC: 4.35 L

## 2024-05-15 LAB — NITRIC OXIDE: Nitric Oxide: 25

## 2024-05-15 MED ORDER — INCRUSE ELLIPTA 62.5 MCG/ACT IN AEPB: 1.0000 | INHALATION_SPRAY | Freq: Every day | RESPIRATORY_TRACT | 3 refills | Status: AC

## 2024-05-15 NOTE — Progress Notes (Signed)
 Synopsis: Referred in by Victoria Domino, MD   Subjective:   PATIENT ID: Victoria [name] Hum GENDER: female DOB: 1960-06-28, MRN: [id number]  Chief Complaint  Patient presents with   Shortness of Breath    SOB is worse. Wheezing. Cough,dry. Airsupra - BID. Wixela- BID, helps with her breathing.     HPI Discussed the use of AI scribe software for clinical note transcription with the patient, who gave verbal consent to proceed.  History of Present Illness   Victoria [name] is a 63 year old female with asthma and sleep apnea who presents with worsening pulmonary symptoms.  She has been experiencing significant pulmonary problems, primarily difficulty with basic breathing, for the past two years. The condition is described as 'really bad', necessitating the use of her emergency inhaler approximately two to three times a day. She also uses a nebulizer and finds Boost oxygen helpful in managing her symptoms.  Her asthma was diagnosed eight years ago, and she also has sleep apnea. Various treatments for sleep apnea have been tried but are intolerable as she removes them during sleep. She is currently using Breo daily but still experiences symptoms. She has been prescribed Wixela but has not started it yet as she is finishing her current supply of Breo.  She experiences a cough that produces phlegm a few times a week, with the sputum being yellow, green, clear, or occasionally brown. Frequent wheezing and chest tightness are noted. She has a history of chronic bronchitis and pneumonia. A CT scan in the past reportedly showed pulmonary fibrosis.  CTA chest 09/2023 without signs of fibrosis but did show bibasilar consolidative opacities consistent with multifocal pneumonia.   Her family history includes lung diseases; her mother had COPD, and her sister had emphysema. She quit smoking in her mid-twenties after smoking from age 89, with a history of smoking up to a pack a day. [place], and notes worsening symptoms since acquiring parakeets.  Allergy testing in the past revealed allergies to dust and beef. She is not currently on systemic steroids due to adverse effects but uses inhaled medications.     OV 05/15/2024 [place] Victoria is here to follow up on her PFT results that showed normal FEV1, normal FVC and normal FEV1 FVC ratio.  Normal lung volumes and normal DLCO.  She does have some response to bronchodilator but does not meet ATS criteria.  She is still having symptoms despite being on Wixela 501 puff twice daily.  I discussed adding Incruse Ellipta  which she is agreeable with.  Furthermore her husband was with her today reported that she has been having significant apneic episodes overnight and wakes up multiple times gasping for air.  She had a sleep study years ago that showed moderate sleep apnea.  Will go ahead and repeat her home sleep study.  Finally she reported that her oxygen has been fluctuating as low as mid 80s but does not sustain and bounces back up.  We did walk in office and she sustained a 90% will obtain an echocardiogram to assess for pulmonary hypertension.  Her CT chest did not show any parenchymal lung disease but it did show calcified coronary arteries.  I will have her see cardiology for evaluation.  Finally it also showed a small 6 mm posterior left upper ground glass pulmonary nodule which I will repeat a CT chest in 12 months.  Family History  Problem Relation Age of Onset   Alcohol   abuse Mother    Arthritis Mother    Heart disease Mother    Liver disease Mother    Osteoporosis Mother    Clotting disorder Mother    Diabetes Father    Arthritis Father    Stroke Father    Sarcoidosis Father    Heart failure Father    Bipolar disorder Sister    Schizophrenia Sister    Depression Sister    Anxiety disorder Sister    Alcohol  abuse Sister    Diabetes Sister    Arthritis Sister    Thyroid  disease  Sister    Lupus Sister    Depression Sister    Anxiety disorder Sister    Alcohol  abuse Sister    Depression Sister    Anxiety disorder Sister    Ehlers-Danlos syndrome Daughter    Breast cancer Neg Hx      Social History   Socioeconomic History   Marital status: Married    Spouse name: bruce   Number of children: 1   Years of education: Not on file   Highest education level: Associate degree: academic program  Occupational History   Occupation: home maker  Tobacco Use   Smoking status: Former    Current packs/day: 0.00    Types: Cigarettes    Quit date: 1987    Years since quitting: 38.9   Smokeless tobacco: Never  Vaping Use   Vaping status: Never Used  Substance and Sexual Activity   Alcohol  use: No    Alcohol /week: 0.0 standard drinks of alcohol    Drug use: No    Comment: No use of illict drugs.   Sexual activity: Not Currently  Other Topics Concern   Not on file  Social History Narrative   Lives with husband at home.   Are you right handed or left handed? Left   Are you currently employed ?    What is your current occupation? Retired/disabled   Do you live at home alone?   Who lives with you? husband   What type of home do you live in: 1 story or 2 story? two    Caffeine  3 cokes   Social Drivers of Health   Tobacco Use: Medium Risk (05/15/2024)   Patient History    Smoking Tobacco Use: Former    Smokeless Tobacco Use: Never    Passive Exposure: Not on file  Financial Resource Strain: Medium Risk (02/13/2024)   Received from [facility]   Overall Financial Resource Strain (CARDIA)    Difficulty of Paying Living Expenses: Somewhat hard  Food Insecurity: Food Insecurity Present (02/13/2024)   Received from [facility]   Epic    Within the past 12 months, you worried that your food would run out before you got the money to buy more.: Sometimes true    Within the past 12 months, the food you bought just didn't last and  you didn't have money to get more.: Sometimes true  Transportation Needs: No Transportation Needs (02/13/2024)   Received from [facility] - Transportation    In the past 12 months, has lack of transportation kept you from medical appointments or from getting medications?: No    Lack of Transportation (Non-Medical): No  Physical Activity: Insufficiently Active (08/25/2023)   Exercise Vital Sign    Days of Exercise per Week: 7 days    Minutes of Exercise per Session: 20 min  Stress: No Stress Concern Present (08/25/2023)   [employer]  [employer] - Occupational Stress Questionnaire    Feeling of Stress : Only a little  Social Connections: Moderately Isolated (08/25/2023)   Social Connection and Isolation Panel    Frequency of Communication with Friends and Family: Once a week    Frequency of Social Gatherings with Friends and Family: Never    Attends Religious Services: 1 to 4 times per year    Active Member of [employer] or Organizations: No    Attends Banker Meetings: Never    Marital Status: Married  Catering Manager Violence: Not At Risk (10/19/2023)   Humiliation, Afraid, Rape, and Kick questionnaire    Fear of Current or Ex-Partner: No    Emotionally Abused: No    Physically Abused: No    Sexually Abused: No  Depression (PHQ2-9): High Risk (02/08/2024)   Depression (PHQ2-9)    PHQ-2 Score: 13  Alcohol  Screen: Low Risk (08/25/2023)   Alcohol  Screen    Last Alcohol  Screening Score (AUDIT): 0  Housing: Low Risk  (02/13/2024)   Received from [facility]   Epic    In the last 12 months, was there a time when you were not able to pay the mortgage or rent on time?: No    In the past 12 months, how many times have you moved where you were living?: 0    At any time in the past 12 months, were you homeless or living in a shelter (including now)?: No  Utilities: Not At Risk (02/13/2024)   Received from [facility]   Epic    In the past 12 months has the electric, gas, oil, or water  company threatened to shut off services in your home?: No  Health Literacy: Adequate Health Literacy (08/25/2023)   B1300 Health Literacy    Frequency of need for help with medical instructions: Never        Objective:   Vitals:   05/15/24 1444  BP: 130/84  Pulse: 72  Temp: 98.4 F (36.9 C)  SpO2: 94%  Weight: 201 lb 3.2 oz (91.3 kg)  Height: 5' 4 (1.626 m)   94% on RA BMI Readings from Last 3 Encounters:  05/15/24 34.54 kg/m  05/15/24 34.54 kg/m  04/24/24 33.46 kg/m   Wt Readings from Last 3 Encounters:  05/15/24 201 lb 3.2 oz (91.3 kg)  05/15/24 201 lb 3.2 oz (91.3 kg)  04/24/24 198 lb (89.8 kg)    Physical Exam GEN: NAD, Healthy Appearing HEENT: Supple Neck, Reactive Pupils, EOMI  CVS: Normal S1, Normal S2, RRR, No murmurs or ES appreciated  Lungs: Clear bilateral air entry.  Abdomen: Soft, non tender, non distended, + BS  Extremities: Warm and well perfused, No edema   Labs and imaging reviewed.   Ancillary Information   CBC    Component Value Date/Time   WBC 7.2 01/20/2024 1354   RBC 4.36 01/20/2024 1354   HGB 13.1 01/20/2024 1354   HGB 15.2 02/10/2023 1433   HCT 40.2 01/20/2024 1354   HCT 46.6 02/10/2023 1433   PLT 229 01/20/2024 1354   PLT 214 02/10/2023 1433   MCV 92.2 01/20/2024 1354   MCV 96 02/10/2023 1433   MCH 30.0 01/20/2024 1354   MCHC 32.6 01/20/2024 1354   RDW 13.5 01/20/2024 1354   RDW 13.5 02/10/2023 1433   LYMPHSABS 1.8 10/22/2023 0951   LYMPHSABS 2.9 02/10/2023 1433   MONOABS 0.7 10/22/2023 0951   EOSABS 166 01/20/2024 1354   EOSABS 0.2  02/10/2023 1433   BASOSABS 43 01/20/2024 1354   BASOSABS 0.0 02/10/2023 1433       Latest Ref Rng & Units 05/15/2024   10:53 AM  PFT Results  FVC-Pre L 2.75  P  FVC-Predicted Pre % 85  P  FVC-Post L 2.77  P  FVC-Predicted Post % 86  P  Pre FEV1/FVC % % 82  P  Post FEV1/FCV % % 87  P  FEV1-Pre L  2.27  P  FEV1-Predicted Pre % 91  P  FEV1-Post L 2.42  P  DLCO uncorrected ml/min/mmHg 19.99  P  DLCO UNC% % 100  P  DLVA Predicted % 125  P  TLC L 4.35  P  TLC % Predicted % 86  P  RV % Predicted % 91  P    P Preliminary result     Assessment & Plan:   #Asthma and chronic bronchitis Feno 25 indeterminate for eosinophilic inflammation Chronic respiratory issues with asthma and chronic bronchitis, history of pneumonia. Current CT reviewed does not show fibrosis or signs of ILD. PFTs overall normal, possible response to bronchodilator suggestive of asthma.. - Continue with Wixela 500-51 puff twice daily.  Advised mouth rinsing after each use. -Start Incruse Ellipta  1 puff daily. -Continue with albuterol  on an as needed basis. - Allergen panel   # Transient hypoxia Her oxygen saturation in office was 90 to 92%.  We did a walk test and it was fluctuating as low as 89% up to 96%.  Her underlying pulmonary disease is out of proportion to that hypoxia.  This raises concern for either shunting or pulmonary hypertension I will start with ordering an echocardiogram to assess her cardiac function and pulmonary arterial systolic pressure -Complete echocardiogram  #Obstructive sleep apnea She does report significant apneic episode confirmed by her husband today in office.  Also reports fatigue during the day. -Will order home sleep study  # 6 mm ground glass nodule in the right upper lobe -Repeat CT chest in 12 months  # Coronary calcification CT chest -Referred to cardiology for further evaluation and possible CT coronaries.  RTC 4 months  I personally spent a total of 40 minutes in the care of the patient today including preparing to see the patient, getting/reviewing separately obtained history, performing a medically appropriate exam/evaluation, counseling and educating, placing orders, documenting clinical information in the EHR, independently interpreting results, and communicating  results.   Darrin Barn, MD [facility] 05/15/2024 4:56 PM

## 2024-05-15 NOTE — Patient Instructions (Signed)
 Full PFT completed today ? ?

## 2024-05-15 NOTE — Progress Notes (Signed)
 Full PFT completed today ? ?

## 2024-05-16 DIAGNOSIS — F4312 Post-traumatic stress disorder, chronic: Secondary | ICD-10-CM | POA: Diagnosis not present

## 2024-05-16 DIAGNOSIS — F5105 Insomnia due to other mental disorder: Secondary | ICD-10-CM | POA: Diagnosis not present

## 2024-05-16 DIAGNOSIS — F411 Generalized anxiety disorder: Secondary | ICD-10-CM | POA: Diagnosis not present

## 2024-05-16 DIAGNOSIS — F410 Panic disorder [episodic paroxysmal anxiety] without agoraphobia: Secondary | ICD-10-CM | POA: Diagnosis not present

## 2024-05-16 DIAGNOSIS — F333 Major depressive disorder, recurrent, severe with psychotic symptoms: Secondary | ICD-10-CM | POA: Diagnosis not present

## 2024-05-17 NOTE — Progress Notes (Unsigned)
 Cardiology Office Note  Date:  05/18/2024   ID:  MODEST DRAEGER, DOB 10-Feb-1961, MRN [id number]  PCP:  Bernardo Fend, DO   Chief Complaint  Patient presents with   New Patient (Initial Visit)    Ref by Dr. Malka for shortness of breath and chest pain.  Patient c/o chest pain that comes and goes, has tachycardia and shortness of breath.      HPI:  Victoria [name] is a 63 y.o. 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   right forearm pathology report scanned refer to media   Melanocarcinoma [facility])    Osteoarthritis of both hips    Pre-diabetes    Psychotic disorder (HCC)    Pulmonary fibrosis (HCC)    Sleep apnea    SVT (supraventricular tachycardia)   CT scan with minimal coronary calcification, minimal aortic atherosclerosis Who presents by referral from Cy Carmel Assaker for consultation of her shortness of breath  Reports symptoms of shortness of breath started approximately 2 years ago  Worried about saturations, using oximeter, Sats in 70s at times when she wakes up  Reports other symptoms such as feeling forgetful, h/a, nausea  Echo pending CT chest May 03, 2024 Images pulled up and reviewed, minimal coronary calcification, minimal aortic atherosclerosis  Previously seen by cardiology in October 2021 Cardiac stress test 2021  Side effects on lipitor, muscle pain  EKG personally reviewed by myself on todays visit EKG Interpretation Date/Time:  Friday May 18 2024 09:12:44 EST Ventricular Rate:  67 PR Interval:  130 QRS Duration:  82 QT Interval:  414 QTC Calculation: 437 R Axis:   -3  Text Interpretation: Normal sinus rhythm  Normal ECG When compared with ECG of 18-Oct-2023 22:33, No significant change was found Confirmed by Perla Lye [phone]) on 05/18/2024 9:18:09 AM    PMH:   has a past medical history of Asthma, Cervical spinal stenosis, Complication of anesthesia, Dyspnea, Dysrhythmia, Fatty liver, GERD (gastroesophageal reflux disease), Hypercholesterolemia, Hypertension, Lumbar stenosis, Lyme disease, Malignant melanoma in situ (HCC) (05/11/2018), Melanocarcinoma (HCC), Osteoarthritis of both hips, Pre-diabetes, Psychotic disorder (HCC), Pulmonary fibrosis (HCC), Sleep apnea, and SVT (supraventricular tachycardia).   PSH:    Past Surgical History:  Procedure Laterality Date   ABDOMINAL HYSTERECTOMY  2010   with BSO   APPENDECTOMY  2006   APPLICATION OF INTRAOPERATIVE CT SCAN N/A 10/17/2023   Procedure: APPLICATION OF INTRAOPERATIVE CT SCAN;  Surgeon: Clois Fret, MD;  Location: [facility] ORS;  Service: Neurosurgery;  Laterality: N/A;   BACK SURGERY     BREAST EXCISIONAL BIOPSY Right 1997   neg   BREAST SURGERY     COLONOSCOPY N/A 02/29/2024   Procedure: COLONOSCOPY;  Surgeon: Therisa Bi, MD;  Location: [facility] ENDOSCOPY;  Service: Gastroenterology;  Laterality: N/A;   ESOPHAGOGASTRODUODENOSCOPY N/A 02/29/2024   Procedure: EGD (ESOPHAGOGASTRODUODENOSCOPY);  Surgeon: Therisa Bi, MD;  Location: [facility] ENDOSCOPY;  Service: Gastroenterology;  Laterality: N/A;   ESOPHAGOGASTRODUODENOSCOPY (EGD) WITH PROPOFOL  N/A 09/20/2022   Procedure: ESOPHAGOGASTRODUODENOSCOPY (EGD) WITH PROPOFOL ;  Surgeon: Therisa Bi, MD;  Location: [facility] ENDOSCOPY;  Service: Gastroenterology;  Laterality: N/A;   ESOPHAGOSCOPY WITH  DILITATION  02/29/2024   Procedure: ESOPHAGOSCOPY, WITH DILATION;  Surgeon: Therisa Bi, MD;  Location: [facility] ENDOSCOPY;  Service: Gastroenterology;;   excision of melanoma on Right arm     HERNIA REPAIR     HIATAL HERNIA REPAIR     Jan 2017   LAPAROSCOPY  2020   POLYPECTOMY  02/29/2024   Procedure:  POLYPECTOMY, INTESTINE;  Surgeon: Therisa Bi, MD;  Location: [facility] ENDOSCOPY;  Service: Gastroenterology;;   TRANSFORAMINAL LUMBAR INTERBODY FUSION W/ MIS 1 LEVEL N/A 10/17/2023   Procedure: L4-5 MINIMALLY INVASIVE (MIS) TRANSFORAMINAL LUMBAR INTERBODY FUSION (TLIF);  Surgeon: Clois Fret, MD;  Location: [facility] ORS;  Service: Neurosurgery;  Laterality: N/A;  L4-5 MINIMALLY INVASIVE (MIS) TRANSFORAMINAL LUMBAR INTERBODY FUSION (TLIF)    Current Outpatient Medications  Medication Sig Dispense Refill   Albuterol -Budesonide  (AIRSUPRA ) 90-80 MCG/ACT AERO Inhale 2 puffs into the lungs every 6 (six) hours as needed. 9 g 6   ARIPiprazole (ABILIFY) 30 MG tablet Take 30 mg by mouth daily.     B Complex Vitamins (B-COMPLEX/B-12 PO) Take by mouth.     baclofen  (LIORESAL ) 10 MG tablet Take by mouth.     buprenorphine (BUTRANS) 7.5 MCG/HR Place 1 patch onto the skin once a week.     cholecalciferol  (VITAMIN D ) 25 MCG tablet Take 1 tablet (1,000 Units total) by mouth daily. 30 tablet 1   estradiol  (ESTRACE ) 0.01 % CREA vaginal cream Place 0.25 Applicatorfuls vaginally at bedtime for 30 days, THEN 0.25 Applicatorfuls every other day for 30 days, THEN 0.25 Applicatorfuls 3 (three) times a week. 90 g 3   FLUoxetine  (PROZAC ) 40 MG capsule Take by mouth every morning.     fluticasone -salmeterol (WIXELA INHUB) 500-50 MCG/ACT AEPB Inhale 1 puff into the lungs in the morning and at bedtime. 1 each 6   gabapentin  (NEURONTIN ) 600 MG tablet Take 600 mg by mouth 3 (three) times daily.     hydrocortisone  2.5 % cream Apply topically behind ears on m-w-f at bedtime for seborrheic dermatitis 30 g 11   hydrOXYzine  (ATARAX ) 25 MG tablet Take 25 mg by mouth daily as needed.     ipratropium-albuterol  (DUONEB) 0.5-2.5 (3) MG/3ML SOLN Take 3 mLs by nebulization 3 (three) times daily as needed (cough wheeze SOB bronchitis/asthma symptoms). 180 mL 1   ketoconazole  (NIZORAL ) 2 % cream Apply 1 Application topically daily. 15 g 0    ketoconazole  (NIZORAL ) 2 % shampoo apply 2 times per week, massage into behind ears and scalp and leave in for 5 -  10 minutes before rinsing out for seborrheic dermatitis 120 mL 11   LINZESS  145 MCG CAPS capsule Take 145 mcg by mouth daily.     losartan  (COZAAR ) 25 MG tablet TAKE 1 TABLET (25 MG TOTAL) BY MOUTH DAILY. 90 tablet 0   metFORMIN  (GLUCOPHAGE ) 500 MG tablet Take 1 tablet (500 mg total) by mouth daily with breakfast. 90 tablet 1   miconazole (MONISTAT 1 COMBINATION PACK) kit Place 1 each vaginally once. (Patient taking differently: Place 1 each vaginally as needed.)     Multiple Vitamins-Minerals (MULTIVITAMIN WITH MINERALS) tablet Take 1 tablet by mouth daily.     Na Sulfate-K Sulfate-Mg Sulfate concentrate (SUPREP) 17.5-3.13-1.6 GM/177ML SOLN Take 354 mLs by mouth as directed.     ondansetron  (ZOFRAN ) 8 MG tablet Take 1 tablet (8 mg total) by mouth every 8 (eight) hours as needed for nausea or vomiting. 20 tablet 0   Oxycodone  HCl 10 MG TABS Take 1 tablet (10 mg total) by  mouth every 6 (six) hours as needed (severe pain). 20 tablet 0   pantoprazole  (PROTONIX ) 40 MG tablet TAKE 1 TABLET BY MOUTH EVERY DAY IN THE MORNING 90 tablet 2   polyvinyl alcohol  (LIQUIFILM TEARS) 1.4 % ophthalmic solution Place 1 drop into both eyes 3 (three) times daily. (Patient taking differently: Place 1 drop into both eyes as needed.) 15 mL 1   SUMAtriptan  (IMITREX ) 100 MG tablet Take 1 tablet (100 mg total) by mouth once as needed for migraine. May repeat in 2 hours if headache persists or recurs. 30 tablet 2   traZODone  (DESYREL ) 100 MG tablet Take 100-200 mg by mouth at bedtime.     umeclidinium bromide  (INCRUSE ELLIPTA ) 62.5 MCG/ACT AEPB Inhale 1 puff into the lungs daily. 3 each 3   venlafaxine  XR (EFFEXOR -XR) 150 MG 24 hr capsule Take 150 mg by mouth every morning.     Vibegron  (GEMTESA ) 75 MG TABS Take 1 tablet (75 mg total) by mouth daily.     No current facility-administered medications for this  visit.    Allergies:   Acetaminophen , Atorvastatin, Sertraline, Cefuroxime, Codeine, Tape, Amoxicillin, Penicillins, and Sulfa antibiotics   Social History:  The patient  reports that she quit smoking about 38 years ago. Her smoking use included cigarettes. She has never used smokeless tobacco. She reports that she does not drink alcohol  and does not use drugs.   Family History:   family history includes Alcohol  abuse in her mother, sister, and sister; Anxiety disorder in her sister, sister, and sister; Arthritis in her father, mother, and sister; Bipolar disorder in her sister; Clotting disorder in her mother; Depression in her sister, sister, and sister; Diabetes in her father and sister; Ehlers-Danlos syndrome in her daughter; Heart disease in her mother; Heart failure in her father; Liver disease in her mother; Lupus in her sister; Osteoporosis in her mother; Sarcoidosis in her father; Schizophrenia in her sister; Stroke in her father; Thyroid  disease in her sister.    Review of Systems: Review of Systems  Constitutional: Negative.   HENT: Negative.    Respiratory: Negative.    Cardiovascular: Negative.   Gastrointestinal: Negative.   Musculoskeletal: Negative.   Neurological: Negative.   Psychiatric/Behavioral: Negative.    All other systems reviewed and are negative.   PHYSICAL EXAM: VS:  BP 120/80 (BP Location: Right Arm, Patient Position: Sitting, Cuff Size: Normal)   Pulse 67   Ht 5' 4.5 (1.638 m)   Wt 201 lb 6 oz (91.3 kg)   SpO2 93%   BMI 34.03 kg/m  , BMI Body mass index is 34.03 kg/m. GEN: Well nourished, well developed, in no acute distress HEENT: normal Neck: no JVD, carotid bruits, or masses Cardiac: RRR; no murmurs, rubs, or gallops,no edema  Respiratory:  clear to auscultation bilaterally, normal work of breathing GI: soft, nontender, nondistended, + BS MS: no deformity or atrophy Skin: warm and dry, no rash Neuro:  Strength and sensation are intact Psych:  euthymic mood, full affect  Recent Labs: 10/18/2023: B Natriuretic Peptide 274.0 01/20/2024: ALT 81; BUN 11; Creat 0.85; Hemoglobin 13.1; Platelets 229; Potassium 4.6; Sodium 138    Lipid Panel Lab Results  Component Value Date   CHOL 278 (H) 07/08/2023   HDL 49 (L) 07/08/2023   LDLCALC 190 (H) 07/08/2023   TRIG 209 (H) 07/08/2023     Wt Readings from Last 3 Encounters:  05/18/24 201 lb 6 oz (91.3 kg)  05/15/24 201 lb 3.2 oz (91.3 kg)  05/15/24  201 lb 3.2 oz (91.3 kg)     ASSESSMENT AND PLAN:  Problem List Items Addressed This Visit       Cardiology Problems   Hypertension   Relevant Orders   EKG 12-Lead (Completed)   Hyperlipidemia     Other   Diabetes mellitus type 2 with complications (HCC)   Prediabetes   Pulmonary fibrosis (HCC)   OSA (obstructive sleep apnea)   Other Visit Diagnoses       Shortness of breath    -  Primary   Relevant Orders   EKG 12-Lead (Completed)     SOB (shortness of breath)       Relevant Orders   EKG 12-Lead (Completed)     Chest pain, unspecified type       Relevant Orders   EKG 12-Lead (Completed)      Shortness of breath Reports symptoms over the past 2 years Followed by pulmonary, reports history of mild asthma Reports that she had pulmonary fibrosis that has since  resolved No regular exercise program CT images pulled up and reviewed, minimal coronary calcification - Recommend we review echocardiogram once results become available end of the year - No indication of CHF based on clinical exam  Hyperlipidemia Reports prior intolerance to Lipitor many years ago Muscle leg Suggest she start Zetia  10 mg daily -Could consider Repatha at a later date  Essential hypertension Reports that she is not taking losartan  Blood pressure somewhat labile but well-controlled on today's visit 120 systolic  Seen in consultation for Dr. Malka and will be referred back to his office for ongoing care of the issues detailed above    Signed, Velinda Lunger, M.D., Ph.D. [facility] [place], [place] [phone]

## 2024-05-18 ENCOUNTER — Ambulatory Visit: Attending: Cardiovascular Disease | Admitting: Cardiovascular Disease

## 2024-05-18 ENCOUNTER — Encounter: Payer: Self-pay | Admitting: Cardiovascular Disease

## 2024-05-18 VITALS — BP 120/80 | HR 67 | Ht 64.5 in | Wt 201.4 lb

## 2024-05-18 DIAGNOSIS — J8410 Pulmonary fibrosis, unspecified: Secondary | ICD-10-CM | POA: Diagnosis not present

## 2024-05-18 DIAGNOSIS — E782 Mixed hyperlipidemia: Secondary | ICD-10-CM | POA: Diagnosis not present

## 2024-05-18 DIAGNOSIS — R7303 Prediabetes: Secondary | ICD-10-CM

## 2024-05-18 DIAGNOSIS — R079 Chest pain, unspecified: Secondary | ICD-10-CM | POA: Diagnosis not present

## 2024-05-18 DIAGNOSIS — E118 Type 2 diabetes mellitus with unspecified complications: Secondary | ICD-10-CM | POA: Diagnosis not present

## 2024-05-18 DIAGNOSIS — G4733 Obstructive sleep apnea (adult) (pediatric): Secondary | ICD-10-CM

## 2024-05-18 DIAGNOSIS — I10 Essential (primary) hypertension: Secondary | ICD-10-CM | POA: Diagnosis not present

## 2024-05-18 DIAGNOSIS — R0602 Shortness of breath: Secondary | ICD-10-CM

## 2024-05-18 MED ORDER — EZETIMIBE 10 MG PO TABS: 10.0000 mg | ORAL_TABLET | Freq: Every day | ORAL | 3 refills | Status: AC

## 2024-05-18 NOTE — Patient Instructions (Addendum)
 Medication Instructions:   Zetia  10 mg daily for cholesterol  If you need a refill on your cardiac medications before your next appointment, please call your pharmacy.   Lab work: No new labs needed  Testing/Procedures: No new testing needed  Follow-Up: At [facility], you and your health needs are our priority.  As part of our continuing mission to provide you with exceptional heart care, we have created designated Provider Care Teams.  These Care Teams include your primary Cardiologist (physician) and Advanced Practice Providers (APPs -  Physician Assistants and Nurse Practitioners) who all work together to provide you with the care you need, when you need it.  You will need a follow up appointment as needed  Providers on your designated Care Team:   Lonni Meager, NP Bernardino Bring, PA-C Cadence [place], [place]  COVID-19 Vaccine Information can be found at: [email or web address] For questions related to vaccine distribution or appointments, please email [email or web address] or call [phone].

## 2024-05-22 ENCOUNTER — Encounter: Payer: Self-pay | Admitting: Student in an Organized Health Care Education/Training Program

## 2024-05-22 ENCOUNTER — Ambulatory Visit

## 2024-05-22 ENCOUNTER — Ambulatory Visit: Admitting: Neurosurgery

## 2024-05-22 ENCOUNTER — Encounter: Payer: Self-pay | Admitting: Neurosurgery

## 2024-05-22 ENCOUNTER — Encounter: Payer: Self-pay | Admitting: Internal Medicine

## 2024-05-22 VITALS — BP 144/80 | Ht 64.5 in | Wt 201.0 lb

## 2024-05-22 DIAGNOSIS — M5412 Radiculopathy, cervical region: Secondary | ICD-10-CM | POA: Diagnosis not present

## 2024-05-22 DIAGNOSIS — G629 Polyneuropathy, unspecified: Secondary | ICD-10-CM

## 2024-05-22 DIAGNOSIS — M542 Cervicalgia: Secondary | ICD-10-CM

## 2024-05-22 DIAGNOSIS — R29898 Other symptoms and signs involving the musculoskeletal system: Secondary | ICD-10-CM

## 2024-05-22 DIAGNOSIS — M4316 Spondylolisthesis, lumbar region: Secondary | ICD-10-CM

## 2024-05-22 DIAGNOSIS — M549 Dorsalgia, unspecified: Secondary | ICD-10-CM | POA: Diagnosis not present

## 2024-05-22 NOTE — Progress Notes (Signed)
" ° °  REFERRING PHYSICIAN:  Bernardo Sharyle Has [address] [place],  [place] [address]  DOS: 10/17/23 MIS TLIF L4-L5 with PSF  HISTORY OF PRESENT ILLNESS:  05/22/2024 Ms. Ford presents today with continued low back pain.  She feels like her legs sometimes feel weak.  She gets pain when she bends that is particularly difficult for her.  When she stands or walks, her pain is much better.,  Overall, her symptoms are improved compared to before surgery.  She has done well with physical therapy for her neck.  03/29/2024 Her back pain has been improving.  She is still having significant back pain, but has noticed improvement over the past several months.  I have sent her to physical therapy for her neck after our last visit, but her back pain was limiting her.  She is at the point where she thinks she could try again.  She continues to have neck pain and discomfort into her shoulder blades and down her arms.  01/03/2024 She is doing pretty well.  Her neuropathy has improved.  Her leg pain has improved.  Her back pain is improved.  She continues to have neck and arm discomfort.  Her pain is worse on the right than the left but extends down her arm into the first 4 digits of the right hand.  She is starting physical therapy on Thursday for her neck.    PHYSICAL EXAMINATION:  General: Patient is well developed, well nourished, calm, collected, and in no apparent distress.   NEUROLOGICAL:  General: In no acute distress.   Awake, alert, oriented to person, place, and time.  Pupils equal round and reactive to light.  Facial tone is symmetric.     Strength:          Side Iliopsoas Quads Hamstring PF DF EHL  R 5 5 5 5 5 5   L 5 5 5 5 5 5    Incisions well healed.   ROS (Neurologic):  Negative except as noted above  IMAGING: C spine MRI 03/07/2024 IMPRESSION: 1. C4-5: Moderate right neural foraminal stenosis, worsened. 2. C5-6: Mild central spinal canal stenosis with  moderate bilateral neural foraminal stenosis, unchanged. Grade 1 anterolisthesis. 3. C6-7: Severe left and moderate right neural foraminal stenosis, unchanged.   Electronically signed by: Franky Stanford MD 03/11/2024 11:42 AM EDT RP Workstation: [id number]   ASSESSMENT/PLAN:  [name] Rosita is doing better s/p above surgery.  She continues to have back pain with movements.  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  I offered physical therapy, but she would like to wait for now.  We will repeat her MRI scan.  I think she may ultimately be a candidate for spinal cord stimulation given her ongoing back pain as well as peripheral neuropathy.  I have given her the name of the medicine [employer] to discuss with her pain management provider.  She expressed some interest of reducing her narcotic usage.  We will check x-rays today.  I will see her back in clinic in 2-3 months.  Reeves Daisy MD Department of neurosurgery "

## 2024-05-25 ENCOUNTER — Other Ambulatory Visit: Payer: Self-pay | Admitting: Internal Medicine

## 2024-05-25 ENCOUNTER — Encounter: Payer: Self-pay | Admitting: Neurosurgery

## 2024-05-25 DIAGNOSIS — R110 Nausea: Secondary | ICD-10-CM

## 2024-05-25 MED ORDER — ONDANSETRON HCL 8 MG PO TABS: 8.0000 mg | ORAL_TABLET | Freq: Three times a day (TID) | ORAL | 0 refills | Status: AC | PRN

## 2024-05-30 ENCOUNTER — Ambulatory Visit
Admission: RE | Admit: 2024-05-30 | Discharge: 2024-05-30 | Disposition: A | Discharge status: Home / Self Care | Source: Ambulatory Visit | Attending: Pulmonary Disease | Admitting: Pulmonary Disease

## 2024-05-30 DIAGNOSIS — R0602 Shortness of breath: Secondary | ICD-10-CM | POA: Insufficient documentation

## 2024-05-30 LAB — ECHOCARDIOGRAM COMPLETE
AR max vel: 2.47 cm2
AV Area VTI: 2.56 cm2
AV Area mean vel: 2.44 cm2
AV Mean grad: 3 mmHg
AV Peak grad: 4.7 mmHg
Ao pk vel: 1.08 m/s
Area-P 1/2: 4.8 cm2
Calc EF: 61.6 %
MV VTI: 2.48 cm2
S' Lateral: 4.2 cm
Single Plane A2C EF: 73.2 %
Single Plane A4C EF: 59 %

## 2024-06-01 ENCOUNTER — Encounter: Payer: Self-pay | Admitting: Pulmonary Disease

## 2024-06-01 ENCOUNTER — Encounter: Payer: Self-pay | Admitting: Family Medicine

## 2024-06-01 ENCOUNTER — Ambulatory Visit
Admission: RE | Admit: 2024-06-01 | Discharge: 2024-06-01 | Disposition: A | Discharge status: Home / Self Care | Source: Ambulatory Visit | Attending: Neurosurgery | Admitting: Neurosurgery

## 2024-06-01 ENCOUNTER — Ambulatory Visit (INDEPENDENT_AMBULATORY_CARE_PROVIDER_SITE_OTHER): Admitting: Family Medicine

## 2024-06-01 VITALS — BP 120/76 | HR 86 | Resp 16 | Ht 64.0 in | Wt 201.0 lb

## 2024-06-01 DIAGNOSIS — K219 Gastro-esophageal reflux disease without esophagitis: Secondary | ICD-10-CM

## 2024-06-01 DIAGNOSIS — R053 Chronic cough: Secondary | ICD-10-CM

## 2024-06-01 DIAGNOSIS — M4316 Spondylolisthesis, lumbar region: Secondary | ICD-10-CM | POA: Insufficient documentation

## 2024-06-01 MED ORDER — FAMOTIDINE 20 MG PO TABS: 20.0000 mg | ORAL_TABLET | Freq: Every day | ORAL | 0 refills | Status: DC

## 2024-06-01 MED ORDER — BENZONATATE 100 MG PO CAPS: 100.0000 mg | ORAL_CAPSULE | Freq: Two times a day (BID) | ORAL | 0 refills | Status: DC | PRN

## 2024-06-01 NOTE — Assessment & Plan Note (Signed)
 Patient is a 64 year old female who complains of chronic cough. Cough ongoing for several months now. She describes cough to be bothersome throughout the day, and also often wakes her up during the night. She is followed by pulmonology, last seen on 05/15/24. Continues Wixela and most recently started on Incruse Ellipta  1 puff daily by pulm. As per HPI she voices compliance with medication regimen. She states she sees some improvement but does not feel that it is ultimately effective for complaint of cough.   All lung fields clear to auscultation. V/s stable.  -Recommended that she follow up with pulmonology regarding concern that new treatment plan is not effective for management of chronic conditions -Offered chest x-ray as per HPI she feels that cough is different, but she declines -Advised to follow through with studies and testing ordered by pulmonology, including sleep study, due to complaints of nocturnal cough  -Will provide script for Benzonatate  100mg  BID PRN cough -Advised to maintain scheduled follow up with PCP on 06/21/24 -GERD symptoms addressed, see below, suspecting that uncontrolled GERD could potentially contribute to cough as described Orders:   benzonatate  (TESSALON ) 100 MG capsule; Take 1 capsule (100 mg total) by mouth 2 (two) times daily as needed for cough.

## 2024-06-01 NOTE — Assessment & Plan Note (Signed)
 She reports chronic cough for several months and also voices uncontrolled GERD symptoms. She voices complaints of heartburn throughout the day regardless of dietary changes and other lifestyle changes as per HPI.   -Continue Pantoprazole  (Protonix ) 40mg  once daily -Will add on Famotidine 20mg  at bedtime to see if this improves GERD symptoms and/or cough -Advised to maintain scheduled follow up with PCP Orders:   famotidine (PEPCID) 20 MG tablet; Take 1 tablet (20 mg total) by mouth at bedtime.

## 2024-06-01 NOTE — Progress Notes (Signed)
 "  Acute Office Visit  Subjective:     Patient ID: Victoria [name], female    DOB: 03-03-1961, 64 y.o.   MRN: [id number]  Chief Complaint  Patient presents with   Cough    Chronic- going on for months. Told by pulmonology she has asthma and chronic bronchitis    HPI Patient is in today for complaints of cough. She is a new patient to me. She voices that cough has been ongoing for several months. She is followed by pulmonology, most recently seen on 05/15/24. She reports compliance with Wixela and Incruse. Incruse recently started at her last pulmonology visit. She also states she is using her emergency inhalers. She voices inhalers are helpful. She feels that this cough is different . She voices her cough wakes her up at night.  She does endorse complaints of uncontrolled heartburn stating symptoms occur during day and night. She voices she has made dietary changes and this has not been helpful. She voices that she tries to stay upright for at least 30 minutes after meals, eats small frequent meals and also takes short walks following her meals.   Review of Systems  Respiratory:  Positive for cough.   Gastrointestinal:  Positive for heartburn.        Objective:    BP 120/76   Pulse 86   Resp 16   Ht 5' 4 (1.626 m)   Wt 201 lb (91.2 kg)   SpO2 99%   BMI 34.50 kg/m    Physical Exam Constitutional:      Appearance: Normal appearance. She is not ill-appearing or toxic-appearing.  Cardiovascular:     Rate and Rhythm: Normal rate and regular rhythm.  Pulmonary:     Effort: Pulmonary effort is normal. No respiratory distress.     Breath sounds: Normal breath sounds. No wheezing, rhonchi or rales.  Neurological:     General: No focal deficit present.     Mental Status: She is alert.  Psychiatric:        Mood and Affect: Mood is anxious.        Behavior: Behavior is cooperative.         Assessment & Plan:   Assessment & Plan Chronic cough Patient is a 64 year  old female who complains of chronic cough. Cough ongoing for several months now. She describes cough to be bothersome throughout the day, and also often wakes her up during the night. She is followed by pulmonology, last seen on 05/15/24. Continues Wixela and most recently started on Incruse Ellipta  1 puff daily by pulm. As per HPI she voices compliance with medication regimen. She states she sees some improvement but does not feel that it is ultimately effective for complaint of cough.   All lung fields clear to auscultation. V/s stable.  -Recommended that she follow up with pulmonology regarding concern that new treatment plan is not effective for management of chronic conditions -Offered chest x-ray as per HPI she feels that cough is different, but she declines -Advised to follow through with studies and testing ordered by pulmonology, including sleep study, due to complaints of nocturnal cough  -Will provide script for Benzonatate  100mg  BID PRN cough -Advised to maintain scheduled follow up with PCP on 06/21/24 -GERD symptoms addressed, see below, suspecting that uncontrolled GERD could potentially contribute to cough as described Orders:   benzonatate  (TESSALON ) 100 MG capsule; Take 1 capsule (100 mg total) by mouth 2 (two) times daily as needed for cough.  Gastroesophageal  reflux disease, unspecified whether esophagitis present She reports chronic cough for several months and also voices uncontrolled GERD symptoms. She voices complaints of heartburn throughout the day regardless of dietary changes and other lifestyle changes as per HPI.   -Continue Pantoprazole  (Protonix ) 40mg  once daily -Will add on Famotidine 20mg  at bedtime to see if this improves GERD symptoms and/or cough -Advised to maintain scheduled follow up with PCP Orders:   famotidine (PEPCID) 20 MG tablet; Take 1 tablet (20 mg total) by mouth at bedtime.     Return if symptoms worsen or fail to improve.  LAYMON LOISE CORE,  FNP   "

## 2024-06-04 ENCOUNTER — Other Ambulatory Visit
Admission: RE | Admit: 2024-06-04 | Discharge: 2024-06-04 | Disposition: A | Discharge status: Home / Self Care | Source: Ambulatory Visit | Attending: Pulmonary Disease | Admitting: Pulmonary Disease

## 2024-06-04 ENCOUNTER — Ambulatory Visit: Admitting: Urology

## 2024-06-04 VITALS — BP 143/83 | HR 79 | Wt 198.0 lb

## 2024-06-04 DIAGNOSIS — N3281 Overactive bladder: Secondary | ICD-10-CM

## 2024-06-04 DIAGNOSIS — R0602 Shortness of breath: Secondary | ICD-10-CM | POA: Insufficient documentation

## 2024-06-04 NOTE — Progress Notes (Signed)
 PTNS  Session # 1/12  Health & Social Factors: No change Caffeine : 3 cups Alcohol : 0 Daytime voids #per day: 10 Night-time voids #per night: 5 Urgency: strong Incontinence Episodes #per day: 2 times weekly  Ankle used: right Treatment Setting: 1 Feeling/ Response: Sensory  Comments: Patient tolerated  Performed By: CLOTILDA CORNWALL, PA-C   Follow Up: One week for # 2/12 PTNS

## 2024-06-06 LAB — ALLERGEN PANEL (27) + IGE
Alternaria Alternata IgE: <0.1 kU/L
Aspergillus Fumigatus IgE: <0.1 kU/L
Bahia Grass IgE: <0.1 kU/L
Bermuda Grass IgE: <0.1 kU/L
Cat Dander IgE: <0.1 kU/L
Cedar, Mountain IgE: <0.1 kU/L
Cladosporium Herbarum IgE: <0.1 kU/L
Cocklebur IgE: <0.1 kU/L
Cockroach, American IgE: <0.1 kU/L
Common Silver Birch IgE: <0.1 kU/L
D Farinae IgE: <0.1 kU/L
D Pteronyssinus IgE: <0.1 kU/L
Dog Dander IgE: <0.1 kU/L
Elm, American IgE: <0.1 kU/L
Hickory, White IgE: <0.1 kU/L
IgE (Immunoglobulin E), Serum: 59 [IU]/mL (ref 6–495)
Johnson Grass IgE: <0.1 kU/L
Kentucky Bluegrass IgE: <0.1 kU/L
Maple/Box Elder IgE: <0.1 kU/L
Mucor Racemosus IgE: <0.1 kU/L
Oak, White IgE: <0.1 kU/L
Penicillium Chrysogen IgE: <0.1 kU/L
Pigweed, Rough IgE: <0.1 kU/L
Plantain, English IgE: <0.1 kU/L
Ragweed, Short IgE: <0.1 kU/L
Setomelanomma Rostrat: <0.1 kU/L
Timothy Grass IgE: <0.1 kU/L
White Mulberry IgE: <0.1 kU/L

## 2024-06-09 ENCOUNTER — Encounter: Payer: Self-pay | Admitting: Neurosurgery

## 2024-06-10 ENCOUNTER — Encounter

## 2024-06-10 DIAGNOSIS — R0602 Shortness of breath: Secondary | ICD-10-CM

## 2024-06-11 ENCOUNTER — Ambulatory Visit (INDEPENDENT_AMBULATORY_CARE_PROVIDER_SITE_OTHER): Admitting: Urology

## 2024-06-11 DIAGNOSIS — N3281 Overactive bladder: Secondary | ICD-10-CM | POA: Diagnosis not present

## 2024-06-11 DIAGNOSIS — N3946 Mixed incontinence: Secondary | ICD-10-CM

## 2024-06-11 NOTE — Progress Notes (Signed)
 PTNS  Session # 2  Health & Social Factors: no change Caffeine : 2 Alcohol : 0 Daytime voids #per day: 8 Night-time voids #per night: 5 Urgency: strong Incontinence Episodes #per day: 3 per week Ankle used: right Treatment Setting: 5 Feeling/ Response: sensory Comments: n/a  Performed By: Mathew Pinal, RN  Follow Up: 1 week

## 2024-06-11 NOTE — Patient Instructions (Signed)

## 2024-06-14 ENCOUNTER — Encounter: Payer: Self-pay | Admitting: Student in an Organized Health Care Education/Training Program

## 2024-06-14 ENCOUNTER — Ambulatory Visit
Attending: Student in an Organized Health Care Education/Training Program | Admitting: Student in an Organized Health Care Education/Training Program

## 2024-06-14 VITALS — BP 118/75 | HR 71 | Temp 97.4°F | Resp 16 | Ht 64.0 in | Wt 200.0 lb

## 2024-06-14 DIAGNOSIS — M47816 Spondylosis without myelopathy or radiculopathy, lumbar region: Secondary | ICD-10-CM | POA: Diagnosis not present

## 2024-06-14 DIAGNOSIS — M48062 Spinal stenosis, lumbar region with neurogenic claudication: Secondary | ICD-10-CM | POA: Diagnosis not present

## 2024-06-14 DIAGNOSIS — M5136 Other intervertebral disc degeneration, lumbar region with discogenic back pain only: Secondary | ICD-10-CM | POA: Diagnosis not present

## 2024-06-14 DIAGNOSIS — M5412 Radiculopathy, cervical region: Secondary | ICD-10-CM | POA: Insufficient documentation

## 2024-06-14 MED ORDER — BUPRENORPHINE 10 MCG/HR TD PTWK: 1.0000 | MEDICATED_PATCH | TRANSDERMAL | 0 refills | Status: AC

## 2024-06-14 MED ORDER — GABAPENTIN 600 MG PO TABS: 600.0000 mg | ORAL_TABLET | Freq: Two times a day (BID) | ORAL | Status: AC

## 2024-06-14 MED ORDER — CYCLOBENZAPRINE HCL 10 MG PO TABS: 10.0000 mg | ORAL_TABLET | Freq: Two times a day (BID) | ORAL | 2 refills | Status: AC | PRN

## 2024-06-14 NOTE — Progress Notes (Signed)
 PROVIDER NOTE: Interpretation of information contained herein should be left to medically-trained personnel. Specific patient instructions are provided elsewhere under Patient Instructions section of medical record. This document was created in part using AI and STT-dictation technology, any transcriptional errors that may result from this process are unintentional.  Patient: Victoria [name] Hum  Service: E/M   PCP: Bernardo Fend, DO  DOB: [birth year]  DOS: 06/14/2024  Provider: Wallie Sherry, MD  MRN: [id number]  Delivery: Face-to-face  Specialty: Interventional Pain Management  Type: Established Patient  Setting: Ambulatory outpatient facility  Specialty designation: 09  Referring Prov.: Bernardo Fend, DO  Location: Outpatient office facility       History of present illness (HPI) [name], a 64 y.o. year old female, is here today because of her Lumbar spondylosis [M47.816]. [name]'s primary complain today is Back Pain (Lumbar bilateral )  Pertinent problems: [name] does not have any pertinent problems on file.  Pain Assessment: Severity of Chronic pain is reported as a 2 /10. Location: Back Lower, Right, Left/down both legs to the feet, when standing for a period of time the legs will become weak and give out, no falls yet, near miss.  uses cane sometimes. Onset: More than a month ago. Quality: Discomfort, Burning, Pins and needles, Constant, Sharp. Timing: Constant. Modifying factor(s): sitting or rest, medications. Vitals:  height is 5' 4 (1.626 m) and weight is 200 lb (90.7 kg). Her temporal temperature is 97.4 F (36.3 C) (abnormal). Her blood pressure is 118/75 and her pulse is 71. Her respiration is 16 and oxygen saturation is 96%.  BMI: Estimated body mass index is 34.33 kg/m as calculated from the following:   Height as of this encounter: 5' 4 (1.626 m).   Weight as of this encounter: 200 lb (90.7 kg).  Last encounter: 12/05/2023. Last procedure:  Visit date not found.  Reason for encounter: History of Present Illness   Victoria [name] is a 64 year old female who presents with fatigue and memory issues while managing chronic pain.  She experiences significant fatigue and low energy levels. She also notes issues with her memory, describing it as 'trashed'.  She has been on oxycodone  for pain management but wants to wean off due to side effects. She mentions previously using Butrans  patches, which were not strong enough at the 7.5 mcg/hour dose. She is currently taking oxycodone , with her last prescription filled on June 08, 2024, and is taking almost three tablets daily, alternating between three and four tablets.  She has a history of back spasms for which baclofen  was prescribed, but she finds it ineffective. She recalls that Flexeril  was helpful in the past. She is currently taking gabapentin  600 mg twice a day, although it was initially prescribed three times a day. She wants to reduce her gabapentin  dosage.  She recently moved from [place] and is transitioning her care to a new provider. She mentions canceling an appointment with a pain management clinic in [place].       Pharmacotherapy Assessment   Butrans  patch 10 mcg/hr Monitoring: [place] PMP: PDMP reviewed during this encounter.       Pharmacotherapy: No side-effects or adverse reactions reported. Compliance: No problems identified. Effectiveness: Clinically acceptable.  Victoria Chrissie MATSU, RN  06/14/2024  9:48 AM  Sign when Signing Visit Safety precautions to be maintained throughout the outpatient stay will include: orient to surroundings, keep bed in low position, maintain call bell within reach at all times, provide assistance with transfer  out of bed and ambulation.   UDS:  Summary  Date Value Ref Range Status  12/05/2023 FINAL  Final    Comment:    ==================================================================== Compliance Drug Analysis,  Ur ==================================================================== Test                             Result       Flag       Units  Drug Present and Declared for Prescription Verification   Oxycodone                       647          EXPECTED   ng/mg creat   Oxymorphone                    974          EXPECTED   ng/mg creat   Noroxycodone                   2747         EXPECTED   ng/mg creat   Noroxymorphone                 410          EXPECTED   ng/mg creat    Sources of oxycodone  are scheduled prescription medications.    Oxymorphone, noroxycodone, and noroxymorphone are expected    metabolites of oxycodone . Oxymorphone is also available as a    scheduled prescription medication.    Gabapentin                      PRESENT      EXPECTED   Trazodone                       PRESENT      EXPECTED   1,3 chlorophenyl piperazine    PRESENT      EXPECTED    1,3-chlorophenyl piperazine is an expected metabolite of trazodone .    Quetiapine                      PRESENT      EXPECTED  Drug Present not Declared for Prescription Verification   Baclofen                        PRESENT      UNEXPECTED   Acetaminophen                   PRESENT      UNEXPECTED   Dextrorphan/Levorphanol        PRESENT      UNEXPECTED    Dextrorphan is an expected metabolite of dextromethorphan, an over-    the-counter or prescription cough suppressant. Levorphanol is a    scheduled prescription medication. Dextrorphan cannot be    distinguished from levorphanol by the method used for analysis.  Drug Absent but Declared for Prescription Verification   Duloxetine                      Not Detected UNEXPECTED ==================================================================== Test                      Result    Flag   Units      Ref Range   Creatinine  187              mg/dL      >=79 ==================================================================== Declared Medications:  The flagging and interpretation  on this report are based on the  following declared medications.  Unexpected results may arise from  inaccuracies in the declared medications.   **Note: The testing scope of this panel includes these medications:   Duloxetine  (Cymbalta )  Gabapentin  (Neurontin )  Oxycodone   Quetiapine  (Seroquel )  Trazodone  (Desyrel )   **Note: The testing scope of this panel does not include the  following reported medications:   Albuterol  (Ventolin  HFA)  Albuterol  (Duoneb)  Benzonatate  (Tessalon )  Cholecalciferol   Eye Drops  Ipratropium (Duoneb)  Ketoconazole  (Nizoral )  Losartan  (Cozaar )  Miconazole  Multivitamin  Naloxegol  (Movantik )  Ondansetron  (Zofran )  Pantoprazole  (Protonix )  Sumatriptan  (Imitrex )  Vitamin B ==================================================================== For clinical consultation, please call [phone]. ====================================================================     No results found for: CBDTHCR No results found for: D8THCCBX No results found for: D9THCCBX  ROS  Constitutional: Denies any fever or chills Gastrointestinal: No reported hemesis, hematochezia, vomiting, or acute GI distress Musculoskeletal: lower back and leg pain Neurological: No reported episodes of acute onset apraxia, aphasia, dysarthria, agnosia, amnesia, paralysis, loss of coordination, or loss of consciousness  Medication Review  ARIPiprazole, Albuterol -Budesonide , B Complex Vitamins, FLUoxetine , Na Sulfate-K Sulfate-Mg Sulfate concentrate, Oxycodone  HCl, SUMAtriptan , Vibegron , artificial tears, baclofen , benzonatate , buprenorphine , cyclobenzaprine , estradiol , ezetimibe , famotidine , fluticasone -salmeterol, gabapentin , hydrOXYzine , hydrocortisone , ipratropium-albuterol , ketoconazole , linaclotide , losartan , metFORMIN , miconazole, multivitamin with minerals, ondansetron , pantoprazole , traZODone , umeclidinium bromide , venlafaxine  XR, and vitamin D3  History Review   Allergy: Ms. Gassen is allergic to acetaminophen , atorvastatin, sertraline, cefuroxime, codeine, tape, amoxicillin, penicillins, and sulfa antibiotics. Drug: Ms. Leyva  reports no history of drug use. Alcohol :  reports no history of alcohol  use. Tobacco:  reports that she quit smoking about 39 years ago. Her smoking use included cigarettes. She has never used smokeless tobacco. Social: Ms. Chiles  reports that she quit smoking about 39 years ago. Her smoking use included cigarettes. She has never used smokeless tobacco. She reports that she does not drink alcohol  and does not use drugs. Medical:  has a past medical history of Asthma, Cervical spinal stenosis, Complication of anesthesia, Dyspnea, Dysrhythmia, Fatty liver, GERD (gastroesophageal reflux disease), Hypercholesterolemia, Hypertension, Lumbar stenosis, Lyme disease, Malignant melanoma in situ (HCC) (05/11/2018), Melanocarcinoma (HCC), Osteoarthritis of both hips, Pre-diabetes, Psychotic disorder (HCC), Pulmonary fibrosis (HCC), Sleep apnea, and SVT (supraventricular tachycardia). Surgical: Ms. Digiulio  has a past surgical history that includes Hiatal hernia repair; Abdominal hysterectomy (2010); Breast excisional biopsy (Right, 1997); Appendectomy (2006); laparoscopy (2020); Esophagogastroduodenoscopy (egd) with propofol  (N/A, 09/20/2022); excision of melanoma on Right arm; Transforaminal lumbar interbody fusion w/ mis 1 level (N/A, 10/17/2023); Application of intraoperative CT scan (N/A, 10/17/2023); Breast surgery; Hernia repair; Back surgery; Colonoscopy (N/A, 02/29/2024); Esophagogastroduodenoscopy (N/A, 02/29/2024); Esophagoscopy with dilitation (02/29/2024); and Polypectomy (02/29/2024). Family: family history includes Alcohol  abuse in her mother, sister, and sister; Anxiety disorder in her sister, sister, and sister; Arthritis in her father, mother, and sister; Bipolar disorder in her sister; Clotting disorder in her mother;  Depression in her sister, sister, and sister; Diabetes in her father and sister; Ehlers-Danlos syndrome in her daughter; Heart disease in her mother; Heart failure in her father; Liver disease in her mother; Lupus in her sister; Osteoporosis in her mother; Sarcoidosis in her father; Schizophrenia in her sister; Stroke in her father; Thyroid  disease in her sister.  Laboratory Chemistry Profile   Renal Lab Results  Component Value  Date   BUN 11 01/20/2024   CREATININE 0.85 01/20/2024   LABCREA 208 02/24/2024   BCR SEE NOTE: 01/20/2024   GFRAA 59 (L) 11/14/2019   GFRNONAA 58 (L) 11/06/2023    Hepatic Lab Results  Component Value Date   AST 74 (H) 01/20/2024   ALT 81 (H) 01/20/2024   ALBUMIN 3.6 11/06/2023   ALKPHOS 206 (H) 11/06/2023   LIPASE 25 11/06/2023    Electrolytes Lab Results  Component Value Date   NA 138 01/20/2024   K 4.6 01/20/2024   CL 104 01/20/2024   CALCIUM 9.3 01/20/2024    Bone Lab Results  Component Value Date   VD25OH 53 09/29/2021    Inflammation (CRP: Acute Phase) (ESR: Chronic Phase) Lab Results  Component Value Date   CRP 9.7 (H) 11/29/2023   ESRSEDRATE 43 (H) 11/29/2023         Note: Above Lab results reviewed.  Recent Imaging Review  MR LUMBAR SPINE WO CONTRAST MR LUMBAR SPINE WITHOUT CONTRAST  CLINICAL HISTORY: Low back pain, prior surgery, new symptoms  TECHNIQUE: Multiplanar, multisequence MRI of the lumbar spine was performed without intravenous contrast.  COMPARISON: May 14, 2023  FINDINGS: Lowest fully formed vertebral body is designated at L5.  Interval posterior fusion and disc implant L4/5 with hemilaminotomy. 3 mm anterolisthesis L4/5, improved from prior. Multilevel minor endplate spurring and minimal disc height loss at L1/2, L2/3, and L3/4, similar to prior. No fracture. Normal conus position.  Postoperative changes in the paraspinal soft tissues.  T12-L1: No disc bulge. No significant facet hypertrophy. Patent  central canal and neuroforamina.  L1-L2: No disc bulge. No significant facet hypertrophy. Patent central canal and neuroforamina.  L2-L3: Mild diffuse disc bulge. Mild facet and ligamentum hypertrophy. Mild canal stenosis, new compared to prior. Patent neural foramina.  L3-L4: Mild diffuse disc bulge with mild facet and ligament hypertrophy. Mild canal stenosis, new compared to prior. Minimal bilateral foraminal encroachment.  L4-L5: Interval posterior fusion with disc implant. Reduction of grade 1 anterolisthesis. Moderate central canal stenosis, improved from prior. Fluid collection in the right hemilaminotomy bed measuring approximately 1.4 x 1.1 cm, probably a seroma (axial T2 image 26). Moderate right foraminal narrowing and mild left foraminal narrowing.  L5-S1: No disc bulge. Mild facet hypertrophy. Patent central canal and neuroforamina.  IMPRESSION: 1. Interval posterior fusion and disc implant L4/5 with reduction of grade 1 anterolisthesis. Moderate canal stenosis, improved from prior. Moderate right and mild left foraminal narrowing.  2.  Mild canal stenosis L2/3 and L3/4, new compared to prior.  3.  Full level by level details above.  Electronically signed by: Margretta Blanch MD 06/09/2024 08:38 AM EST RP Workstation: [id number] Note: Reviewed        Physical Exam  Vitals: BP 118/75 (BP Location: Right Arm, Patient Position: Sitting, Cuff Size: Large)   Pulse 71   Temp (!) 97.4 F (36.3 C) (Temporal)   Resp 16   Ht 5' 4 (1.626 m)   Wt 200 lb (90.7 kg)   SpO2 96%   BMI 34.33 kg/m  BMI: Estimated body mass index is 34.33 kg/m as calculated from the following:   Height as of this encounter: 5' 4 (1.626 m).   Weight as of this encounter: 200 lb (90.7 kg). Ideal: Ideal body weight: 54.7 kg (120 lb 9.5 oz) Adjusted ideal body weight: 69.1 kg (152 lb 5.7 oz) General appearance: Well nourished, well developed, and well hydrated. In no apparent acute  distress Mental status: Alert, oriented  x 3 (person, place, & time)       Respiratory: No evidence of acute respiratory distress Eyes: PERLA   Assessment   Diagnosis Status  1. Lumbar spondylosis   2. Lumbar facet arthropathy   3. Degeneration of intervertebral disc of lumbar region with discogenic back pain   4. Spinal stenosis, lumbar region, with neurogenic claudication   5. Cervical radicular pain    Controlled Controlled Controlled   Updated Problems: No problems updated.  Plan of Care  Problem-specific:  Assessment and Plan    Chronic lumbar discogenic back pain   She experiences chronic lumbar discogenic back pain with fatigue and cognitive side effects from long-term oxycodone  use. Her current regimen includes oxycodone , baclofen , and gabapentin . Baclofen  is ineffective for back spasms, and gabapentin  is taken twice daily instead of the prescribed three times daily. She is motivated to wean off oxycodone  due to side effects and is considering Butrans  for pain management, as it lacks the GI and cognitive side effects associated with oxycodone . Flexeril  is considered as an alternative for back spasms. Butrans  patch is reinitiated at 10 mcg/hour, alternating sites. She will wean off oxycodone  by decreasing one tablet every four to five days after starting Butrans . Gabapentin  will continue at 600 mg twice daily. Baclofen  is discontinued. Flexeril  10 mg is prescribed once or twice daily as needed. Spinal cord stimulation will be considered in the future if needed. A urine screen is performed for medication management.       Ms. KIMETHA TRULSON has a current medication list which includes the following long-term medication(s): aripiprazole, aripiprazole, ezetimibe , famotidine , fluticasone -salmeterol, ipratropium-albuterol , losartan , metformin , pantoprazole , sumatriptan , and gabapentin .  Pharmacotherapy (Medications Ordered): Meds ordered this encounter  Medications    buprenorphine  (BUTRANS ) 10 MCG/HR PTWK    Sig: Place 1 patch onto the skin once a week for 28 days.    Dispense:  4 patch    Refill:  0    Chronic Pain: STOP Act (Not applicable) Fill 1 day early if closed on refill date. Avoid benzodiazepines within 8 hours of opioids   gabapentin  (NEURONTIN ) 600 MG tablet    Sig: Take 1 tablet (600 mg total) by mouth 2 (two) times daily.   cyclobenzaprine  (FLEXERIL ) 10 MG tablet    Sig: Take 1 tablet (10 mg total) by mouth every 12 (twelve) hours as needed for muscle spasms.    Dispense:  60 tablet    Refill:  2    Do not place this medication, or any other prescription from our practice, on Automatic Refill. Patient may have prescription filled one day early if pharmacy is closed on scheduled refill date.   Orders:  Orders Placed This Encounter  Procedures   Compliance Drug Analysis, Ur    Volume: 30 ml(s). Minimum 3 ml of urine is needed. Document temperature of fresh sample. Indications: Long term (current) use of opiate analgesic (S20.108) Test#: [phone] (Comprehensive Profile)    Release to patient:   Immediate    Return in about 4 weeks (around 07/12/2024) for MM (discuss Butrans  and SCS)-schedule on M or W.    Recent Visits No visits were found meeting these conditions. Showing recent visits within past 90 days and meeting all other requirements Today's Visits Date Type Provider Dept  06/14/24 Office Visit Marcelino Nurse, MD [facility]  Showing today's visits and meeting all other requirements Future Appointments Date Type Provider Dept  07/10/24 Appointment Marcelino Nurse, MD [facility]  Showing future appointments within next  90 days and meeting all other requirements  I discussed the assessment and treatment plan with the patient. The patient was provided an opportunity to ask questions and all were answered. The patient agreed with the plan and demonstrated an understanding of the instructions.  Patient advised  to call back or seek an in-person evaluation if the symptoms or condition worsens.  I personally spent a total of 30 minutes in the care of the patient today including preparing to see the patient, getting/reviewing separately obtained history, performing a medically appropriate exam/evaluation, counseling and educating, placing orders, and documenting clinical information in the EHR.   Note by: Wallie Sherry, MD (TTS and AI technology used. I apologize for any typographical errors that were not detected and corrected.) Date: 06/14/2024; Time: 11:54 AM

## 2024-06-14 NOTE — Progress Notes (Signed)
 Safety precautions to be maintained throughout the outpatient stay will include: orient to surroundings, keep bed in low position, maintain call bell within reach at all times, provide assistance with transfer out of bed and ambulation.

## 2024-06-18 ENCOUNTER — Ambulatory Visit (INDEPENDENT_AMBULATORY_CARE_PROVIDER_SITE_OTHER): Admitting: Urology

## 2024-06-18 DIAGNOSIS — N3281 Overactive bladder: Secondary | ICD-10-CM | POA: Diagnosis not present

## 2024-06-18 DIAGNOSIS — N3946 Mixed incontinence: Secondary | ICD-10-CM

## 2024-06-18 NOTE — Progress Notes (Signed)
 PTNS  Session # 3  Health & Social Factors: no change Caffeine : 2 Alcohol : 0 Daytime voids #per day: 7-8 Night-time voids #per night: 3 Urgency: strong Incontinence Episodes #per day: 1 Ankle used: right Treatment Setting: 8 Feeling/ Response: sensory Comments: n/a  Performed By: Mathew Pinal, RN  Follow Up: 1 week

## 2024-06-18 NOTE — Patient Instructions (Signed)

## 2024-06-21 ENCOUNTER — Ambulatory Visit: Admitting: Internal Medicine

## 2024-06-21 ENCOUNTER — Telehealth: Payer: Self-pay | Admitting: Student in an Organized Health Care Education/Training Program

## 2024-06-21 ENCOUNTER — Other Ambulatory Visit: Payer: Self-pay

## 2024-06-21 VITALS — BP 126/82 | HR 77 | Temp 97.8°F | Resp 16 | Ht 64.0 in | Wt 203.0 lb

## 2024-06-21 DIAGNOSIS — E118 Type 2 diabetes mellitus with unspecified complications: Secondary | ICD-10-CM

## 2024-06-21 DIAGNOSIS — K219 Gastro-esophageal reflux disease without esophagitis: Secondary | ICD-10-CM

## 2024-06-21 DIAGNOSIS — F333 Major depressive disorder, recurrent, severe with psychotic symptoms: Secondary | ICD-10-CM

## 2024-06-21 DIAGNOSIS — J0110 Acute frontal sinusitis, unspecified: Secondary | ICD-10-CM

## 2024-06-21 DIAGNOSIS — Z7984 Long term (current) use of oral hypoglycemic drugs: Secondary | ICD-10-CM

## 2024-06-21 DIAGNOSIS — J4531 Mild persistent asthma with (acute) exacerbation: Secondary | ICD-10-CM

## 2024-06-21 DIAGNOSIS — E782 Mixed hyperlipidemia: Secondary | ICD-10-CM

## 2024-06-21 DIAGNOSIS — E669 Obesity, unspecified: Secondary | ICD-10-CM

## 2024-06-21 DIAGNOSIS — E66811 Obesity, class 1: Secondary | ICD-10-CM

## 2024-06-21 DIAGNOSIS — Z789 Other specified health status: Secondary | ICD-10-CM

## 2024-06-21 LAB — POCT GLYCOSYLATED HEMOGLOBIN (HGB A1C): Hemoglobin A1C: 5.9 % — AB (ref 4.0–5.6)

## 2024-06-21 MED ORDER — BENZONATATE 100 MG PO CAPS: 100.0000 mg | ORAL_CAPSULE | Freq: Two times a day (BID) | ORAL | 0 refills | Status: AC | PRN

## 2024-06-21 MED ORDER — TIRZEPATIDE 2.5 MG/0.5ML ~~LOC~~ SOAJ: 2.5000 mg | SUBCUTANEOUS | 0 refills | Status: AC

## 2024-06-21 MED ORDER — IPRATROPIUM-ALBUTEROL 0.5-2.5 (3) MG/3ML IN SOLN: 3.0000 mL | Freq: Three times a day (TID) | RESPIRATORY_TRACT | 1 refills | Status: AC | PRN

## 2024-06-21 MED ORDER — FAMOTIDINE 20 MG PO TABS: 20.0000 mg | ORAL_TABLET | Freq: Every day | ORAL | 1 refills | Status: AC

## 2024-06-21 MED ORDER — AZITHROMYCIN 250 MG PO TABS: ORAL_TABLET | ORAL | 0 refills | Status: AC

## 2024-06-21 NOTE — Progress Notes (Signed)
 "  Established Patient Office Visit  Subjective    Patient ID: Victoria [name], female    DOB: 06/14/1960  Age: 64 y.o. MRN: [id number]  CC:  Chief Complaint  Patient presents with   Follow-up    HPI Victoria [name] presents to for follow up on chronic medical conditions. She is here with her husband.   Discussed the use of AI scribe software for clinical note transcription with the patient, who gave verbal consent to proceed.  History of Present Illness  Victoria [name] is a 64 year old female with diabetes and respiratory issues who presents with symptoms of a sinus infection and worsening cough.  She has a worsening cough and significant yellow-green nasal discharge. The cough has persisted since her last visit and is worse despite Tessalon  Perles, which gives partial relief. She notes intermittent low-grade fevers around 75F.  She has increased shortness of breath and wheezing, worse than usual, occurring with activity and at rest and sometimes causing panic. She uses Incruse, Wixela, and albuterol  inhalers and has been using albuterol  more often. She also uses a nebulizer for breathing treatments and has adequate nebulizer medication on hand.  Her medications include Flexeril , a patch, gabapentin , oxycodone , Abilify, famotidine , Zofran , Zetia , pantoprazole , venlafaxine , Linzess , estrogen cream, hydroxyzine , baclofen , trazodone , and metformin  500 mg once daily. Her Abilify dose was recently reduced from 30 mg to 5 mg. She has diabetes with a recent A1c of 5.9, improved from 6.6, and is managed with metformin  500 mg once daily.   Outpatient Encounter Medications as of 06/21/2024  Medication Sig   Albuterol -Budesonide  (AIRSUPRA ) 90-80 MCG/ACT AERO Inhale 2 puffs into the lungs every 6 (six) hours as needed.   azithromycin  (ZITHROMAX ) 250 MG tablet Take 2 tablets on day 1, then 1 tablet daily on days 2 through 5   buprenorphine  (BUTRANS ) 10 MCG/HR PTWK Place 1 patch onto the  skin once a week for 28 days.   cholecalciferol  (VITAMIN D ) 25 MCG tablet Take 1 tablet (1,000 Units total) by mouth daily.   cyclobenzaprine  (FLEXERIL ) 10 MG tablet Take 1 tablet (10 mg total) by mouth every 12 (twelve) hours as needed for muscle spasms.   estradiol  (ESTRACE ) 0.01 % CREA vaginal cream Place 0.25 Applicatorfuls vaginally at bedtime for 30 days, THEN 0.25 Applicatorfuls every other day for 30 days, THEN 0.25 Applicatorfuls 3 (three) times a week.   ezetimibe  (ZETIA ) 10 MG tablet Take 1 tablet (10 mg total) by mouth daily.   FLUoxetine  (PROZAC ) 40 MG capsule Take by mouth every morning.   fluticasone -salmeterol (WIXELA INHUB) 500-50 MCG/ACT AEPB Inhale 1 puff into the lungs in the morning and at bedtime.   gabapentin  (NEURONTIN ) 600 MG tablet Take 1 tablet (600 mg total) by mouth 2 (two) times daily.   hydrocortisone  2.5 % cream Apply topically behind ears on m-w-f at bedtime for seborrheic dermatitis   hydrOXYzine  (ATARAX ) 25 MG tablet Take 25 mg by mouth daily as needed.   ketoconazole  (NIZORAL ) 2 % cream Apply 1 Application topically daily.   ketoconazole  (NIZORAL ) 2 % shampoo apply 2 times per week, massage into behind ears and scalp and leave in for 5 -  10 minutes before rinsing out for seborrheic dermatitis   LINZESS  145 MCG CAPS capsule Take 145 mcg by mouth daily.   Multiple Vitamins-Minerals (MULTIVITAMIN WITH MINERALS) tablet Take 1 tablet by mouth daily.   ondansetron  (ZOFRAN ) 8 MG tablet Take 1 tablet (8 mg total) by mouth every 8 (eight) hours  as needed for nausea or vomiting.   Oxycodone  HCl 10 MG TABS Take 1 tablet (10 mg total) by mouth every 6 (six) hours as needed (severe pain).   pantoprazole  (PROTONIX ) 40 MG tablet TAKE 1 TABLET BY MOUTH EVERY DAY IN THE MORNING   SUMAtriptan  (IMITREX ) 100 MG tablet Take 1 tablet (100 mg total) by mouth once as needed for migraine. May repeat in 2 hours if headache persists or recurs.   tirzepatide  (MOUNJARO ) 2.5 MG/0.5ML Pen  Inject 2.5 mg into the skin once a week.   traZODone  (DESYREL ) 100 MG tablet Take 100-200 mg by mouth at bedtime.   umeclidinium bromide  (INCRUSE ELLIPTA ) 62.5 MCG/ACT AEPB Inhale 1 puff into the lungs daily.   venlafaxine  XR (EFFEXOR -XR) 150 MG 24 hr capsule Take 150 mg by mouth every morning.   [DISCONTINUED] ARIPiprazole (ABILIFY) 5 MG tablet Take 35 mg by mouth every morning.   [DISCONTINUED] B Complex Vitamins (B-COMPLEX/B-12 PO) Take by mouth.   [DISCONTINUED] famotidine  (PEPCID ) 20 MG tablet Take 1 tablet (20 mg total) by mouth at bedtime.   [DISCONTINUED] ipratropium-albuterol  (DUONEB) 0.5-2.5 (3) MG/3ML SOLN Take 3 mLs by nebulization 3 (three) times daily as needed (cough wheeze SOB bronchitis/asthma symptoms).   [DISCONTINUED] metFORMIN  (GLUCOPHAGE ) 500 MG tablet Take 1 tablet (500 mg total) by mouth daily with breakfast.   [DISCONTINUED] polyvinyl alcohol  (LIQUIFILM TEARS) 1.4 % ophthalmic solution Place 1 drop into both eyes 3 (three) times daily. (Patient taking differently: Place 1 drop into both eyes as needed.)   ARIPiprazole (ABILIFY) 5 MG tablet Take 7 tablets (35 mg total) by mouth every morning.   benzonatate  (TESSALON ) 100 MG capsule Take 1 capsule (100 mg total) by mouth 2 (two) times daily as needed for cough.   famotidine  (PEPCID ) 20 MG tablet Take 1 tablet (20 mg total) by mouth at bedtime.   ipratropium-albuterol  (DUONEB) 0.5-2.5 (3) MG/3ML SOLN Take 3 mLs by nebulization 3 (three) times daily as needed (cough wheeze SOB bronchitis/asthma symptoms).   [DISCONTINUED] ARIPiprazole (ABILIFY) 30 MG tablet Take 30 mg by mouth daily. (Patient not taking: Reported on 06/21/2024)   [DISCONTINUED] baclofen  (LIORESAL ) 10 MG tablet Take by mouth. (Patient not taking: Reported on 06/21/2024)   [DISCONTINUED] benzonatate  (TESSALON ) 100 MG capsule Take 1 capsule (100 mg total) by mouth 2 (two) times daily as needed for cough. (Patient not taking: Reported on 06/21/2024)   [DISCONTINUED]  losartan  (COZAAR ) 25 MG tablet TAKE 1 TABLET (25 MG TOTAL) BY MOUTH DAILY. (Patient not taking: Reported on 06/21/2024)   [DISCONTINUED] miconazole (MONISTAT 1 COMBINATION PACK) kit Place 1 each vaginally once. (Patient not taking: Reported on 06/21/2024)   [DISCONTINUED] Na Sulfate-K Sulfate-Mg Sulfate concentrate (SUPREP) 17.5-3.13-1.6 GM/177ML SOLN Take 354 mLs by mouth as directed. (Patient not taking: Reported on 06/21/2024)   [DISCONTINUED] Vibegron  (GEMTESA ) 75 MG TABS Take 1 tablet (75 mg total) by mouth daily. (Patient not taking: Reported on 06/21/2024)   No facility-administered encounter medications on file as of 06/21/2024.    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   right forearm pathology report scanned refer to media   Melanocarcinoma [facility])    Osteoarthritis  of both hips    Pre-diabetes    Psychotic disorder (HCC)    Pulmonary fibrosis (HCC)    Sleep apnea    SVT (supraventricular tachycardia)     Past Surgical History:  Procedure Laterality Date   ABDOMINAL HYSTERECTOMY  2010   with BSO   APPENDECTOMY  2006   APPLICATION OF INTRAOPERATIVE CT SCAN N/A 10/17/2023   Procedure: APPLICATION OF INTRAOPERATIVE CT SCAN;  Surgeon: Clois Fret, MD;  Location: [facility] ORS;  Service: Neurosurgery;  Laterality: N/A;   BACK SURGERY     BREAST EXCISIONAL BIOPSY Right 1997   neg   BREAST SURGERY     COLONOSCOPY N/A 02/29/2024   Procedure: COLONOSCOPY;  Surgeon: Therisa Bi, MD;  Location: [facility] ENDOSCOPY;  Service: Gastroenterology;  Laterality: N/A;   ESOPHAGOGASTRODUODENOSCOPY N/A 02/29/2024   Procedure: EGD (ESOPHAGOGASTRODUODENOSCOPY);  Surgeon: Therisa Bi, MD;  Location: [facility] ENDOSCOPY;  Service:  Gastroenterology;  Laterality: N/A;   ESOPHAGOGASTRODUODENOSCOPY (EGD) WITH PROPOFOL  N/A 09/20/2022   Procedure: ESOPHAGOGASTRODUODENOSCOPY (EGD) WITH PROPOFOL ;  Surgeon: Therisa Bi, MD;  Location: [facility] ENDOSCOPY;  Service: Gastroenterology;  Laterality: N/A;   ESOPHAGOSCOPY WITH DILITATION  02/29/2024   Procedure: ESOPHAGOSCOPY, WITH DILATION;  Surgeon: Therisa Bi, MD;  Location: [facility] ENDOSCOPY;  Service: Gastroenterology;;   excision of melanoma on Right arm     HERNIA REPAIR     HIATAL HERNIA REPAIR     Jan 2017   LAPAROSCOPY  2020   POLYPECTOMY  02/29/2024   Procedure: POLYPECTOMY, INTESTINE;  Surgeon: Therisa Bi, MD;  Location: [facility] ENDOSCOPY;  Service: Gastroenterology;;   TRANSFORAMINAL LUMBAR INTERBODY FUSION W/ MIS 1 LEVEL N/A 10/17/2023   Procedure: L4-5 MINIMALLY INVASIVE (MIS) TRANSFORAMINAL LUMBAR INTERBODY FUSION (TLIF);  Surgeon: Clois Fret, MD;  Location: [facility] ORS;  Service: Neurosurgery;  Laterality: N/A;  L4-5 MINIMALLY INVASIVE (MIS) TRANSFORAMINAL LUMBAR INTERBODY FUSION (TLIF)    Family History  Problem Relation Age of Onset   Alcohol  abuse Mother    Arthritis Mother    Heart disease Mother    Liver disease Mother    Osteoporosis Mother    Clotting disorder Mother    Diabetes Father    Arthritis Father    Stroke Father    Sarcoidosis Father    Heart failure Father    Bipolar disorder Sister    Schizophrenia Sister    Depression Sister    Anxiety disorder Sister    Alcohol  abuse Sister    Diabetes Sister    Arthritis Sister    Thyroid  disease Sister    Lupus Sister    Depression Sister    Anxiety disorder Sister    Alcohol  abuse Sister    Depression Sister    Anxiety disorder Sister    Ehlers-Danlos syndrome Daughter    Breast cancer Neg Hx     Social History   Socioeconomic History   Marital status: Married    Spouse name: bruce   Number of children: 1   Years of education: Not on file   Highest education level: Associate degree:  academic program  Occupational History   Occupation: home maker  Tobacco Use   Smoking status: Former    Current packs/day: 0.00    Types: Cigarettes    Quit date: 1987    Years since quitting: 39.0   Smokeless tobacco: Never  Vaping Use   Vaping status: Never Used  Substance and Sexual Activity   Alcohol  use: No    Alcohol /week: 0.0 standard drinks of alcohol    Drug use:  No    Comment: No use of illict drugs.   Sexual activity: Not Currently  Other Topics Concern   Not on file  Social History Narrative   Lives with husband at home.   Are you right handed or left handed? Left   Are you currently employed ?    What is your current occupation? Retired/disabled   Do you live at home alone?   Who lives with you? husband   What type of home do you live in: 1 story or 2 story? two    Caffeine  3 cokes   Social Drivers of Health   Tobacco Use: Medium Risk (06/14/2024)   Patient History    Smoking Tobacco Use: Former    Smokeless Tobacco Use: Never    Passive Exposure: Not on file  Financial Resource Strain: Medium Risk (02/13/2024)   Received from [facility]   Overall Financial Resource Strain (CARDIA)    Difficulty of Paying Living Expenses: Somewhat hard  Food Insecurity: Food Insecurity Present (02/13/2024)   Received from [facility]   Epic    Within the past 12 months, you worried that your food would run out before you got the money to buy more.: Sometimes true    Within the past 12 months, the food you bought just didn't last and you didn't have money to get more.: Sometimes true  Transportation Needs: No Transportation Needs (02/13/2024)   Received from [facility] - Transportation    In the past 12 months, has lack of transportation kept you from medical appointments or from getting medications?: No    Lack of Transportation (Non-Medical): No  Physical Activity: Insufficiently Active (08/25/2023)    Exercise Vital Sign    Days of Exercise per Week: 7 days    Minutes of Exercise per Session: 20 min  Stress: No Stress Concern Present (08/25/2023)   [employer] of Occupational [employer] - Occupational Stress Questionnaire    Feeling of Stress : Only a little  Social Connections: Moderately Isolated (08/25/2023)   Social Connection and Isolation Panel    Frequency of Communication with Friends and Family: Once a week    Frequency of Social Gatherings with Friends and Family: Never    Attends Religious Services: 1 to 4 times per year    Active Member of [employer] or Organizations: No    Attends Banker Meetings: Never    Marital Status: Married  Catering Manager Violence: Not At Risk (10/19/2023)   Humiliation, Afraid, Rape, and Kick questionnaire    Fear of Current or Ex-Partner: No    Emotionally Abused: No    Physically Abused: No    Sexually Abused: No  Depression (PHQ2-9): Low Risk (06/14/2024)   Depression (PHQ2-9)    PHQ-2 Score: 3  Alcohol  Screen: Low Risk (06/21/2024)   Alcohol  Screen    Last Alcohol  Screening Score (AUDIT): 0  Housing: Low Risk  (02/13/2024)   Received from [facility]   Epic    In the last 12 months, was there a time when you were not able to pay the mortgage or rent on time?: No    In the past 12 months, how many times have you moved where you were living?: 0    At any time in the past 12 months, were you homeless or living in a shelter (including now)?: No  Utilities: Not At Risk (02/13/2024)   Received from [facility]  Epic    In the past 12 months has the electric, gas, oil, or water  company threatened to shut off services in your home?: No  Health Literacy: Adequate Health Literacy (08/25/2023)   B1300 Health Literacy    Frequency of need for help with medical instructions: Never    Review of Systems  Unable to perform ROS: Other  Constitutional:  Negative for chills and fever.  HENT:  Positive for  congestion and sinus pain. Negative for sore throat.   Respiratory:  Positive for cough, sputum production and shortness of breath. Negative for wheezing.   PER HPI    Objective    BP 126/82 (Cuff Size: Large)   Pulse 77   Temp 97.8 F (36.6 C) (Oral)   Resp 16   Ht 5' 4 (1.626 m)   Wt 203 lb (92.1 kg)   SpO2 98%   BMI 34.84 kg/m   Physical Exam Constitutional:      Appearance: Normal appearance. She is obese.  HENT:     Head: Normocephalic and atraumatic.     Nose: Congestion present.     Mouth/Throat:     Mouth: Mucous membranes are moist.     Pharynx: Oropharynx is clear.  Eyes:     Conjunctiva/sclera: Conjunctivae normal.  Cardiovascular:     Rate and Rhythm: Normal rate and regular rhythm.  Pulmonary:     Effort: Pulmonary effort is normal.     Breath sounds: Normal breath sounds.  Skin:    General: Skin is warm and dry.  Neurological:     General: No focal deficit present.     Mental Status: She is alert. Mental status is at baseline.  Psychiatric:        Mood and Affect: Mood normal.        Behavior: Behavior normal.       Assessment & Plan:   Assessment & Plan  Acute sinusitis Symptoms improved with Tessalon  Perles, no signs of pneumonia or lower respiratory infection. Considered bacterial infection but improvement suggests otherwise. - Prescribed azithromycin  (Z-Pak). - Refilled Tessalon  Perles.  Mild persistent asthma with acute exacerbation Increased shortness of breath and wheezing, managed with current medications. - Refilled Duoneb for nebulizer use.  Type 2 diabetes mellitus w/Hyperglycemia A1c improved from 6.2 to 5.9. Discussed transitioning to Mounjaro  for better glycemic control and weight loss benefits. Mounjaro  preferred for dosing options and tolerance. - Discontinued metformin  upon initiation of Mounjaro . - Prescribed Mounjaro  2.5 mg weekly. - Scheduled follow-up in one month to assess response to Mounjaro .  Obesity BMI of 34.  Discussed weight loss options with Mounjaro  to increase satiety and reduce food intake. - Initiated Mounjaro  for weight loss and glycemic control.  Gastroesophageal reflux disease Managed with famotidine  and pantoprazole . Mounjaro  may exacerbate acid reflux, but current medications may mitigate risk. - Refilled famotidine .  Hyperlipidemia - Recheck lipid panel today.   MDD Stable, needs refills of Abilify today.   - POCT HgB A1C - tirzepatide  (MOUNJARO ) 2.5 MG/0.5ML Pen; Inject 2.5 mg into the skin once a week.  Dispense: 2 mL; Refill: 0 - Comprehensive Metabolic Panel (CMET) - CBC w/Diff/Platelet - Lipid Profile - famotidine  (PEPCID ) 20 MG tablet; Take 1 tablet (20 mg total) by mouth at bedtime.  Dispense: 90 tablet; Refill: 1 - benzonatate  (TESSALON ) 100 MG capsule; Take 1 capsule (100 mg total) by mouth 2 (two) times daily as needed for cough.  Dispense: 20 capsule; Refill: 0 - azithromycin  (ZITHROMAX ) 250 MG tablet; Take 2 tablets  on day 1, then 1 tablet daily on days 2 through 5  Dispense: 6 tablet; Refill: 0 - ipratropium-albuterol  (DUONEB) 0.5-2.5 (3) MG/3ML SOLN; Take 3 mLs by nebulization 3 (three) times daily as needed (cough wheeze SOB bronchitis/asthma symptoms).  Dispense: 180 mL; Refill: 1 - Measles/Mumps/Rubella Immunity   Return in about 4 weeks (around 07/19/2024).   Sharyle Fischer, DO   "

## 2024-06-21 NOTE — Telephone Encounter (Signed)
 Returned patient phone call, voicemail left that I do not see in the plan that Dr Marcelino was going to prescribe oxycodone .  If she feels that she needs that medication she would need an appt and there is no guarantee that he will be obliged to prescribe that medication.  If I am missing anything or there is more information that I need to please reach back out to our office.

## 2024-06-21 NOTE — Telephone Encounter (Signed)
 Wanted to know could DR. Lateef prescribe Oxycodone  for her and send to the pharmacy.

## 2024-06-22 ENCOUNTER — Ambulatory Visit: Payer: Self-pay | Admitting: Internal Medicine

## 2024-06-22 LAB — CBC WITH DIFFERENTIAL/PLATELET
Absolute Lymphocytes: 2244 {cells}/uL (ref 850–3900)
Absolute Monocytes: 469 {cells}/uL (ref 200–950)
Basophils Absolute: 28 {cells}/uL (ref 0–200)
Basophils Relative: 0.4 %
Eosinophils Absolute: 128 {cells}/uL (ref 15–500)
Eosinophils Relative: 1.8 %
HCT: 38.7 % (ref 35.9–46.0)
Hemoglobin: 12.9 g/dL (ref 11.7–15.5)
MCH: 29.9 pg (ref 27.0–33.0)
MCHC: 33.3 g/dL (ref 31.6–35.4)
MCV: 89.8 fL (ref 81.4–101.7)
MPV: 10.7 fL (ref 7.5–12.5)
Monocytes Relative: 6.6 %
Neutro Abs: 4232 {cells}/uL (ref 1500–7800)
Neutrophils Relative %: 59.6 %
Platelets: 193 Thousand/uL (ref 140–400)
RBC: 4.31 Million/uL (ref 3.80–5.10)
RDW: 13.3 % (ref 11.0–15.0)
Total Lymphocyte: 31.6 %
WBC: 7.1 Thousand/uL (ref 3.8–10.8)

## 2024-06-22 LAB — COMPREHENSIVE METABOLIC PANEL WITH GFR
AG Ratio: 1.4 (calc) (ref 1.0–2.5)
ALT: 31 U/L — ABNORMAL HIGH (ref 6–29)
AST: 28 U/L (ref 10–35)
Albumin: 4.1 g/dL (ref 3.6–5.1)
Alkaline phosphatase (APISO): 106 U/L (ref 37–153)
BUN: 15 mg/dL (ref 7–25)
CO2: 32 mmol/L (ref 20–32)
Calcium: 9.1 mg/dL (ref 8.6–10.4)
Chloride: 103 mmol/L (ref 98–110)
Creat: 1 mg/dL (ref 0.50–1.05)
Globulin: 3 g/dL (ref 1.9–3.7)
Glucose, Bld: 92 mg/dL (ref 65–99)
Potassium: 4.4 mmol/L (ref 3.5–5.3)
Sodium: 140 mmol/L (ref 135–146)
Total Bilirubin: 0.4 mg/dL (ref 0.2–1.2)
Total Protein: 7.1 g/dL (ref 6.1–8.1)
eGFR: 63 mL/min/1.73m2

## 2024-06-22 LAB — LIPID PANEL
Cholesterol: 227 mg/dL — ABNORMAL HIGH
HDL: 77 mg/dL
LDL Cholesterol (Calc): 128 mg/dL — ABNORMAL HIGH
Non-HDL Cholesterol (Calc): 150 mg/dL — ABNORMAL HIGH
Total CHOL/HDL Ratio: 2.9 (calc)
Triglycerides: 116 mg/dL

## 2024-06-22 LAB — MEASLES/MUMPS/RUBELLA IMMUNITY
Mumps IgG: 87.6 [AU]/ml
Rubella: 5.88 {index}
Rubeola IgG: 172 [AU]/ml

## 2024-06-24 LAB — COMPLIANCE DRUG ANALYSIS, UR

## 2024-06-25 ENCOUNTER — Ambulatory Visit

## 2024-06-27 DIAGNOSIS — G4733 Obstructive sleep apnea (adult) (pediatric): Secondary | ICD-10-CM | POA: Diagnosis not present

## 2024-07-02 ENCOUNTER — Ambulatory Visit

## 2024-07-09 ENCOUNTER — Ambulatory Visit

## 2024-07-10 ENCOUNTER — Ambulatory Visit: Admitting: Student in an Organized Health Care Education/Training Program

## 2024-07-16 ENCOUNTER — Ambulatory Visit

## 2024-07-23 ENCOUNTER — Ambulatory Visit

## 2024-07-25 ENCOUNTER — Ambulatory Visit: Admitting: Internal Medicine

## 2024-07-30 ENCOUNTER — Ambulatory Visit

## 2024-08-06 ENCOUNTER — Ambulatory Visit

## 2024-08-13 ENCOUNTER — Ambulatory Visit

## 2024-08-20 ENCOUNTER — Ambulatory Visit

## 2024-08-21 ENCOUNTER — Ambulatory Visit: Admitting: Neurosurgery

## 2024-08-27 ENCOUNTER — Ambulatory Visit

## 2024-08-30 ENCOUNTER — Encounter

## 2024-09-03 ENCOUNTER — Ambulatory Visit

## 2025-01-22 ENCOUNTER — Ambulatory Visit: Admitting: Dermatology
